# Patient Record
Sex: Male | Born: 1950 | Race: Black or African American | Hispanic: No | Marital: Married | Smoking: Never smoker
Health system: Southern US, Community
[De-identification: ages and names within clinical notes are randomized; demographics above are authoritative.]

## PROBLEM LIST (undated history)

## (undated) DIAGNOSIS — I209 Angina pectoris, unspecified: Secondary | ICD-10-CM

## (undated) DIAGNOSIS — I509 Heart failure, unspecified: Secondary | ICD-10-CM

## (undated) DIAGNOSIS — C61 Malignant neoplasm of prostate: Secondary | ICD-10-CM

## (undated) DIAGNOSIS — I639 Cerebral infarction, unspecified: Secondary | ICD-10-CM

## (undated) DIAGNOSIS — E785 Hyperlipidemia, unspecified: Secondary | ICD-10-CM

## (undated) DIAGNOSIS — E039 Hypothyroidism, unspecified: Secondary | ICD-10-CM

## (undated) DIAGNOSIS — I350 Nonrheumatic aortic (valve) stenosis: Secondary | ICD-10-CM

## (undated) DIAGNOSIS — Z789 Other specified health status: Secondary | ICD-10-CM

## (undated) DIAGNOSIS — I5189 Other ill-defined heart diseases: Secondary | ICD-10-CM

## (undated) DIAGNOSIS — I6529 Occlusion and stenosis of unspecified carotid artery: Secondary | ICD-10-CM

## (undated) DIAGNOSIS — I10 Essential (primary) hypertension: Secondary | ICD-10-CM

## (undated) DIAGNOSIS — N19 Unspecified kidney failure: Secondary | ICD-10-CM

## (undated) DIAGNOSIS — I2510 Atherosclerotic heart disease of native coronary artery without angina pectoris: Secondary | ICD-10-CM

## (undated) DIAGNOSIS — N186 End stage renal disease: Secondary | ICD-10-CM

## (undated) DIAGNOSIS — I739 Peripheral vascular disease, unspecified: Secondary | ICD-10-CM

## (undated) DIAGNOSIS — E119 Type 2 diabetes mellitus without complications: Secondary | ICD-10-CM

## (undated) HISTORY — DX: Atherosclerotic heart disease of native coronary artery without angina pectoris: I25.10

## (undated) HISTORY — DX: Hyperlipidemia, unspecified: E78.5

## (undated) HISTORY — DX: Occlusion and stenosis of unspecified carotid artery: I65.29

## (undated) HISTORY — DX: Peripheral vascular disease, unspecified: I73.9

## (undated) HISTORY — DX: Other ill-defined heart diseases: I51.89

## (undated) HISTORY — DX: Heart failure, unspecified: I50.9

## (undated) HISTORY — DX: Nonrheumatic aortic (valve) stenosis: I35.0

## (undated) HISTORY — DX: Essential (primary) hypertension: I10

## (undated) HISTORY — DX: Hypothyroidism, unspecified: E03.9

## (undated) HISTORY — PX: THYROIDECTOMY: SHX17

---

## 1978-03-09 HISTORY — PX: PR VEIN BYPASS GRAFT,AORTO-FEM-POP: 35551

## 1997-06-08 ENCOUNTER — Other Ambulatory Visit: Admission: RE | Admit: 1997-06-08 | Discharge: 1997-06-08 | Payer: Self-pay | Admitting: *Deleted

## 1997-06-15 ENCOUNTER — Other Ambulatory Visit: Admission: RE | Admit: 1997-06-15 | Discharge: 1997-06-15 | Payer: Self-pay | Admitting: *Deleted

## 1997-06-18 ENCOUNTER — Other Ambulatory Visit: Admission: RE | Admit: 1997-06-18 | Discharge: 1997-06-18 | Payer: Self-pay | Admitting: Nephrology

## 1997-06-22 ENCOUNTER — Other Ambulatory Visit: Admission: RE | Admit: 1997-06-22 | Discharge: 1997-06-22 | Payer: Self-pay | Admitting: *Deleted

## 1997-06-25 ENCOUNTER — Other Ambulatory Visit: Admission: RE | Admit: 1997-06-25 | Discharge: 1997-06-25 | Payer: Self-pay | Admitting: *Deleted

## 1997-06-27 ENCOUNTER — Other Ambulatory Visit: Admission: RE | Admit: 1997-06-27 | Discharge: 1997-06-27 | Payer: Self-pay | Admitting: *Deleted

## 1997-06-29 ENCOUNTER — Other Ambulatory Visit: Admission: RE | Admit: 1997-06-29 | Discharge: 1997-06-29 | Payer: Self-pay | Admitting: *Deleted

## 1997-07-04 ENCOUNTER — Other Ambulatory Visit: Admission: RE | Admit: 1997-07-04 | Discharge: 1997-07-04 | Payer: Self-pay | Admitting: Nephrology

## 1997-07-09 ENCOUNTER — Other Ambulatory Visit: Admission: RE | Admit: 1997-07-09 | Discharge: 1997-07-09 | Payer: Self-pay | Admitting: Nephrology

## 1997-07-25 ENCOUNTER — Other Ambulatory Visit: Admission: RE | Admit: 1997-07-25 | Discharge: 1997-07-25 | Payer: Self-pay | Admitting: *Deleted

## 1997-08-29 ENCOUNTER — Other Ambulatory Visit: Admission: RE | Admit: 1997-08-29 | Discharge: 1997-08-29 | Payer: Self-pay | Admitting: Nephrology

## 1997-09-26 ENCOUNTER — Other Ambulatory Visit: Admission: RE | Admit: 1997-09-26 | Discharge: 1997-09-26 | Payer: Self-pay | Admitting: *Deleted

## 1998-04-14 ENCOUNTER — Emergency Department (HOSPITAL_COMMUNITY): Admission: EM | Admit: 1998-04-14 | Discharge: 1998-04-14 | Payer: Self-pay | Admitting: Emergency Medicine

## 1998-04-25 ENCOUNTER — Ambulatory Visit (HOSPITAL_COMMUNITY): Admission: RE | Admit: 1998-04-25 | Discharge: 1998-04-25 | Payer: Self-pay | Admitting: *Deleted

## 1998-07-25 ENCOUNTER — Inpatient Hospital Stay (HOSPITAL_COMMUNITY): Admission: RE | Admit: 1998-07-25 | Discharge: 1998-08-01 | Payer: Self-pay

## 1998-09-05 ENCOUNTER — Ambulatory Visit (HOSPITAL_COMMUNITY): Admission: RE | Admit: 1998-09-05 | Discharge: 1998-09-05 | Payer: Self-pay | Admitting: Nephrology

## 1998-09-05 ENCOUNTER — Encounter: Payer: Self-pay | Admitting: Nephrology

## 1999-02-25 ENCOUNTER — Emergency Department (HOSPITAL_COMMUNITY): Admission: EM | Admit: 1999-02-25 | Discharge: 1999-02-25 | Payer: Self-pay | Admitting: *Deleted

## 1999-03-10 HISTORY — PX: NEPHRECTOMY TRANSPLANTED ORGAN: SUR880

## 1999-05-22 ENCOUNTER — Encounter (INDEPENDENT_AMBULATORY_CARE_PROVIDER_SITE_OTHER): Payer: Self-pay | Admitting: *Deleted

## 1999-05-22 ENCOUNTER — Ambulatory Visit (HOSPITAL_COMMUNITY): Admission: RE | Admit: 1999-05-22 | Discharge: 1999-05-22 | Payer: Self-pay | Admitting: General Surgery

## 1999-08-19 ENCOUNTER — Encounter: Payer: Self-pay | Admitting: Cardiology

## 1999-08-19 ENCOUNTER — Ambulatory Visit (HOSPITAL_COMMUNITY): Admission: RE | Admit: 1999-08-19 | Discharge: 1999-08-19 | Payer: Self-pay | Admitting: Cardiology

## 2000-05-05 ENCOUNTER — Ambulatory Visit (HOSPITAL_BASED_OUTPATIENT_CLINIC_OR_DEPARTMENT_OTHER): Admission: RE | Admit: 2000-05-05 | Discharge: 2000-05-05 | Payer: Self-pay | Admitting: Orthopedic Surgery

## 2004-03-04 ENCOUNTER — Encounter: Admission: RE | Admit: 2004-03-04 | Discharge: 2004-03-04 | Payer: Self-pay | Admitting: Nephrology

## 2004-10-18 ENCOUNTER — Emergency Department (HOSPITAL_COMMUNITY): Admission: EM | Admit: 2004-10-18 | Discharge: 2004-10-18 | Payer: Self-pay | Admitting: Emergency Medicine

## 2005-02-06 ENCOUNTER — Emergency Department (HOSPITAL_COMMUNITY): Admission: EM | Admit: 2005-02-06 | Discharge: 2005-02-06 | Payer: Self-pay | Admitting: Emergency Medicine

## 2006-08-10 ENCOUNTER — Ambulatory Visit: Payer: Self-pay | Admitting: Vascular Surgery

## 2006-08-10 ENCOUNTER — Inpatient Hospital Stay (HOSPITAL_COMMUNITY): Admission: EM | Admit: 2006-08-10 | Discharge: 2006-08-15 | Payer: Self-pay | Admitting: Emergency Medicine

## 2006-08-12 ENCOUNTER — Encounter: Payer: Self-pay | Admitting: Vascular Surgery

## 2006-09-01 ENCOUNTER — Ambulatory Visit: Payer: Self-pay | Admitting: Vascular Surgery

## 2006-09-22 ENCOUNTER — Ambulatory Visit: Payer: Self-pay | Admitting: Vascular Surgery

## 2007-07-17 ENCOUNTER — Emergency Department (HOSPITAL_COMMUNITY): Admission: EM | Admit: 2007-07-17 | Discharge: 2007-07-17 | Payer: Self-pay | Admitting: Emergency Medicine

## 2007-07-23 ENCOUNTER — Emergency Department (HOSPITAL_COMMUNITY): Admission: EM | Admit: 2007-07-23 | Discharge: 2007-07-23 | Payer: Self-pay | Admitting: Emergency Medicine

## 2007-10-26 ENCOUNTER — Ambulatory Visit (HOSPITAL_COMMUNITY): Admission: RE | Admit: 2007-10-26 | Discharge: 2007-10-26 | Payer: Self-pay | Admitting: Urology

## 2007-11-29 ENCOUNTER — Ambulatory Visit: Admission: RE | Admit: 2007-11-29 | Discharge: 2007-12-06 | Payer: Self-pay | Admitting: Radiation Oncology

## 2008-02-14 ENCOUNTER — Ambulatory Visit: Admission: RE | Admit: 2008-02-14 | Discharge: 2008-03-08 | Payer: Self-pay | Admitting: Radiation Oncology

## 2008-03-09 ENCOUNTER — Ambulatory Visit: Admission: RE | Admit: 2008-03-09 | Discharge: 2008-06-07 | Payer: Self-pay | Admitting: Radiation Oncology

## 2009-01-01 ENCOUNTER — Observation Stay (HOSPITAL_COMMUNITY): Admission: EM | Admit: 2009-01-01 | Discharge: 2009-01-03 | Payer: Self-pay | Admitting: Emergency Medicine

## 2009-03-09 DIAGNOSIS — I2510 Atherosclerotic heart disease of native coronary artery without angina pectoris: Secondary | ICD-10-CM

## 2009-03-09 HISTORY — DX: Atherosclerotic heart disease of native coronary artery without angina pectoris: I25.10

## 2009-03-09 HISTORY — PX: CORONARY ANGIOPLASTY WITH STENT PLACEMENT: SHX49

## 2009-03-23 ENCOUNTER — Encounter: Payer: Self-pay | Admitting: Emergency Medicine

## 2009-03-23 ENCOUNTER — Inpatient Hospital Stay (HOSPITAL_COMMUNITY): Admission: RE | Admit: 2009-03-23 | Discharge: 2009-03-27 | Payer: Self-pay | Admitting: Cardiology

## 2009-03-25 ENCOUNTER — Encounter (INDEPENDENT_AMBULATORY_CARE_PROVIDER_SITE_OTHER): Payer: Self-pay | Admitting: Cardiology

## 2010-05-25 LAB — BRAIN NATRIURETIC PEPTIDE: Pro B Natriuretic peptide (BNP): 77 pg/mL (ref 0.0–100.0)

## 2010-05-25 LAB — DIFFERENTIAL
Basophils Absolute: 0 10*3/uL (ref 0.0–0.1)
Eosinophils Absolute: 0.1 10*3/uL (ref 0.0–0.7)
Lymphocytes Relative: 15 % (ref 12–46)
Lymphs Abs: 0.7 10*3/uL (ref 0.7–4.0)
Neutrophils Relative %: 72 % (ref 43–77)

## 2010-05-25 LAB — BASIC METABOLIC PANEL
Calcium: 9.7 mg/dL (ref 8.4–10.5)
GFR calc Af Amer: 53 mL/min — ABNORMAL LOW (ref >60)
GFR calc non Af Amer: 44 mL/min — ABNORMAL LOW (ref >60)
Potassium: 4.1 mEq/L (ref 3.5–5.1)
Sodium: 137 mEq/L (ref 135–145)

## 2010-05-25 LAB — CK TOTAL AND CKMB (NOT AT ARMC)
CK, MB: 9.3 ng/mL (ref 0.3–4.0)
Relative Index: 5.4 — ABNORMAL HIGH (ref 0.0–2.5)

## 2010-05-25 LAB — CBC
HCT: 31.3 % — ABNORMAL LOW (ref 39.0–52.0)
Hemoglobin: 10.1 g/dL — ABNORMAL LOW (ref 13.0–17.0)
Platelets: 263 10*3/uL (ref 150–400)
WBC: 4.4 10*3/uL (ref 4.0–10.5)

## 2010-05-25 LAB — CARDIAC PANEL(CRET KIN+CKTOT+MB+TROPI)
CK, MB: 12.2 ng/mL (ref 0.3–4.0)
Relative Index: 6 — ABNORMAL HIGH (ref 0.0–2.5)
Total CK: 204 U/L (ref 7–232)

## 2010-05-25 LAB — GLUCOSE, CAPILLARY: Glucose-Capillary: 180 mg/dL — ABNORMAL HIGH (ref 70–99)

## 2010-05-25 LAB — TSH: TSH: 5.694 u[IU]/mL — ABNORMAL HIGH (ref 0.350–4.500)

## 2010-05-25 LAB — APTT: aPTT: 62 seconds — ABNORMAL HIGH (ref 24–37)

## 2010-05-25 LAB — TROPONIN I: Troponin I: 0.45 ng/mL — ABNORMAL HIGH (ref 0.00–0.06)

## 2010-05-25 LAB — HEPARIN LEVEL (UNFRACTIONATED): Heparin Unfractionated: 0.17 IU/mL — ABNORMAL LOW (ref 0.30–0.70)

## 2010-05-26 LAB — RENAL FUNCTION PANEL
Albumin: 3.2 g/dL — ABNORMAL LOW (ref 3.5–5.2)
BUN: 27 mg/dL — ABNORMAL HIGH (ref 6–23)
Calcium: 9.3 mg/dL (ref 8.4–10.5)
Creatinine, Ser: 1.89 mg/dL — ABNORMAL HIGH (ref 0.4–1.5)
GFR calc Af Amer: 45 mL/min — ABNORMAL LOW (ref >60)
GFR calc non Af Amer: 37 mL/min — ABNORMAL LOW (ref >60)

## 2010-05-26 LAB — CBC
HCT: 28.1 % — ABNORMAL LOW (ref 39.0–52.0)
HCT: 30.5 % — ABNORMAL LOW (ref 39.0–52.0)
HCT: 32.6 % — ABNORMAL LOW (ref 39.0–52.0)
Hemoglobin: 10.2 g/dL — ABNORMAL LOW (ref 13.0–17.0)
Hemoglobin: 10.7 g/dL — ABNORMAL LOW (ref 13.0–17.0)
MCHC: 33.2 g/dL (ref 30.0–36.0)
MCHC: 33.5 g/dL (ref 30.0–36.0)
MCV: 78.8 fL (ref 78.0–100.0)
MCV: 79.2 fL (ref 78.0–100.0)
Platelets: 191 10*3/uL (ref 150–400)
Platelets: 226 10*3/uL (ref 150–400)
Platelets: 227 10*3/uL (ref 150–400)
Platelets: 256 10*3/uL (ref 150–400)
RBC: 3.86 MIL/uL — ABNORMAL LOW (ref 4.22–5.81)
RBC: 4.12 MIL/uL — ABNORMAL LOW (ref 4.22–5.81)
RDW: 19.5 % — ABNORMAL HIGH (ref 11.5–15.5)
RDW: 20 % — ABNORMAL HIGH (ref 11.5–15.5)
RDW: 20.1 % — ABNORMAL HIGH (ref 11.5–15.5)
WBC: 3.6 10*3/uL — ABNORMAL LOW (ref 4.0–10.5)
WBC: 4.2 10*3/uL (ref 4.0–10.5)

## 2010-05-26 LAB — GLUCOSE, CAPILLARY
Glucose-Capillary: 105 mg/dL — ABNORMAL HIGH (ref 70–99)
Glucose-Capillary: 108 mg/dL — ABNORMAL HIGH (ref 70–99)
Glucose-Capillary: 119 mg/dL — ABNORMAL HIGH (ref 70–99)
Glucose-Capillary: 139 mg/dL — ABNORMAL HIGH (ref 70–99)
Glucose-Capillary: 168 mg/dL — ABNORMAL HIGH (ref 70–99)
Glucose-Capillary: 170 mg/dL — ABNORMAL HIGH (ref 70–99)
Glucose-Capillary: 68 mg/dL — ABNORMAL LOW (ref 70–99)

## 2010-05-26 LAB — HEPARIN LEVEL (UNFRACTIONATED)
Heparin Unfractionated: 0.52 IU/mL (ref 0.30–0.70)
Heparin Unfractionated: 0.75 IU/mL — ABNORMAL HIGH (ref 0.30–0.70)
Heparin Unfractionated: 0.77 IU/mL — ABNORMAL HIGH (ref 0.30–0.70)

## 2010-05-26 LAB — BASIC METABOLIC PANEL
BUN: 21 mg/dL (ref 6–23)
BUN: 26 mg/dL — ABNORMAL HIGH (ref 6–23)
BUN: 26 mg/dL — ABNORMAL HIGH (ref 6–23)
CO2: 23 mEq/L (ref 19–32)
CO2: 24 mEq/L (ref 19–32)
Calcium: 9 mg/dL (ref 8.4–10.5)
Calcium: 9.4 mg/dL (ref 8.4–10.5)
Chloride: 105 mEq/L (ref 96–112)
Creatinine, Ser: 1.63 mg/dL — ABNORMAL HIGH (ref 0.4–1.5)
Creatinine, Ser: 1.69 mg/dL — ABNORMAL HIGH (ref 0.4–1.5)
GFR calc Af Amer: 51 mL/min — ABNORMAL LOW (ref >60)
GFR calc non Af Amer: 37 mL/min — ABNORMAL LOW (ref >60)
Glucose, Bld: 118 mg/dL — ABNORMAL HIGH (ref 70–99)
Glucose, Bld: 134 mg/dL — ABNORMAL HIGH (ref 70–99)
Potassium: 4.1 mEq/L (ref 3.5–5.1)
Potassium: 4.3 mEq/L (ref 3.5–5.1)
Sodium: 141 mEq/L (ref 135–145)

## 2010-05-26 LAB — LIPID PANEL
HDL: 44 mg/dL (ref >39)
Total CHOL/HDL Ratio: 5 RATIO
Triglycerides: 191 mg/dL — ABNORMAL HIGH (ref <150)
VLDL: 38 mg/dL (ref 0–40)

## 2010-05-26 LAB — PROTIME-INR
INR: 1.08 (ref 0.00–1.49)
Prothrombin Time: 13.9 seconds (ref 11.6–15.2)

## 2010-05-26 LAB — DIFFERENTIAL
Basophils Absolute: 0 10*3/uL (ref 0.0–0.1)
Basophils Absolute: 0.1 10*3/uL (ref 0.0–0.1)
Eosinophils Absolute: 0.1 10*3/uL (ref 0.0–0.7)
Eosinophils Relative: 1 % (ref 0–5)
Eosinophils Relative: 3 % (ref 0–5)
Lymphocytes Relative: 19 % (ref 12–46)
Lymphs Abs: 0.7 10*3/uL (ref 0.7–4.0)
Monocytes Absolute: 0.5 10*3/uL (ref 0.1–1.0)
Neutro Abs: 2.3 10*3/uL (ref 1.7–7.7)
Neutrophils Relative %: 64 % (ref 43–77)

## 2010-05-26 LAB — CARDIAC PANEL(CRET KIN+CKTOT+MB+TROPI)
Relative Index: 5.5 — ABNORMAL HIGH (ref 0.0–2.5)
Total CK: 188 U/L (ref 7–232)
Troponin I: 1.21 ng/mL (ref 0.00–0.06)

## 2010-05-26 LAB — IRON AND TIBC
Iron: 34 ug/dL — ABNORMAL LOW (ref 42–135)
Saturation Ratios: 22 % (ref 20–55)
TIBC: 155 ug/dL — ABNORMAL LOW (ref 215–435)
UIBC: 121 ug/dL

## 2010-06-12 LAB — URINE CULTURE

## 2010-06-12 LAB — BASIC METABOLIC PANEL
BUN: 36 mg/dL — ABNORMAL HIGH (ref 6–23)
Calcium: 10.3 mg/dL (ref 8.4–10.5)
Chloride: 101 mEq/L (ref 96–112)
Creatinine, Ser: 2.04 mg/dL — ABNORMAL HIGH (ref 0.4–1.5)
GFR calc Af Amer: 41 mL/min — ABNORMAL LOW (ref >60)
GFR calc non Af Amer: 34 mL/min — ABNORMAL LOW (ref >60)

## 2010-06-12 LAB — URINALYSIS, ROUTINE W REFLEX MICROSCOPIC
Bilirubin Urine: NEGATIVE
Leukocytes, UA: NEGATIVE
Nitrite: NEGATIVE
Specific Gravity, Urine: 1.031 — ABNORMAL HIGH (ref 1.005–1.030)
pH: 6 (ref 5.0–8.0)

## 2010-06-12 LAB — GLUCOSE, RANDOM: Glucose, Bld: 386 mg/dL — ABNORMAL HIGH (ref 70–99)

## 2010-06-12 LAB — GLUCOSE, CAPILLARY
Glucose-Capillary: 103 mg/dL — ABNORMAL HIGH (ref 70–99)
Glucose-Capillary: 128 mg/dL — ABNORMAL HIGH (ref 70–99)
Glucose-Capillary: 136 mg/dL — ABNORMAL HIGH (ref 70–99)
Glucose-Capillary: 290 mg/dL — ABNORMAL HIGH (ref 70–99)
Glucose-Capillary: 408 mg/dL — ABNORMAL HIGH (ref 70–99)
Glucose-Capillary: 440 mg/dL — ABNORMAL HIGH (ref 70–99)
Glucose-Capillary: 462 mg/dL — ABNORMAL HIGH (ref 70–99)
Glucose-Capillary: 466 mg/dL — ABNORMAL HIGH (ref 70–99)

## 2010-06-12 LAB — POCT I-STAT, CHEM 8
Chloride: 105 mEq/L (ref 96–112)
Glucose, Bld: 465 mg/dL — ABNORMAL HIGH (ref 70–99)
HCT: 39 % (ref 39.0–52.0)
Potassium: 4.6 mEq/L (ref 3.5–5.1)

## 2010-06-12 LAB — CBC
MCV: 80.5 fL (ref 78.0–100.0)
Platelets: 203 10*3/uL (ref 150–400)
RBC: 4.46 MIL/uL (ref 4.22–5.81)
WBC: 4.4 10*3/uL (ref 4.0–10.5)

## 2010-06-12 LAB — URINE MICROSCOPIC-ADD ON

## 2010-06-12 LAB — DIFFERENTIAL
Eosinophils Relative: 3 % (ref 0–5)
Lymphocytes Relative: 20 % (ref 12–46)
Lymphs Abs: 0.9 10*3/uL (ref 0.7–4.0)
Monocytes Absolute: 0.4 10*3/uL (ref 0.1–1.0)

## 2010-06-12 LAB — KETONES, QUALITATIVE: Acetone, Bld: NEGATIVE

## 2010-06-12 LAB — HEMOGLOBIN A1C: Hgb A1c MFr Bld: 12.8 % — ABNORMAL HIGH (ref 4.6–6.1)

## 2010-07-22 NOTE — H&P (Signed)
Joshua [name], Joshua [name] NO.:  [id number]   MEDICAL RECORD NO.:  [id number]          PATIENT TYPE:  EMS   LOCATION:  MAJO                         FACILITY:  [facility]   PHYSICIAN:  Di Kindle. Edilia Bo, M.D.DATE OF BIRTH:  November 03, 1950   DATE OF ADMISSION:  08/10/2006  DATE OF DISCHARGE:                              HISTORY & PHYSICAL   REASON FOR ADMISSION:  Swelling in the left groin with left femoral  pseudoaneurysm.   HISTORY:  This is a pleasant 60 year old gentleman who has a history of  chronic renal insufficiency.  He ultimately underwent cadaveric renal  transplant on July 04, 2000, at [facility].  He has had a  nonfunctioning left thigh AV graft according to the patient.  On  Saturday, which was approximately 4 days ago, he developed some swelling  in the left groin.  This has persisted and he was seen in the office  today by Dr. Eliott Nine, who felt the patient should be transferred to the  hospital for further evaluation.   The patient has mild discomfort associated with the swelling in the left  groin.  He denies any fever or chills at home.  He states that the graft  was nonfunctioning.  I really do not get any clear-cut history of  claudication or rest pain in either lower extremity.  He is a fairly  poor historian.   PAST MEDICAL HISTORY:  His past medical history is significant for  hypertension and chronic renal insufficiency.  He denies any history of  diabetes, hypercholesterolemia, history of previous myocardial  infarction or history of congestive heart failure.  He denies any  history of COPD.  The only records I have from the hospital were a  history and physical from June of 2001.  At that time, they noted that  he had a parathyroidectomy in May of 2000, secondary to  hyperparathyroidism.  He also had a small right lobe thyroid papillary  carcinoma which was resected as well at that time.  He has apparently  had a right inguinal hernia  repair by Dr. Lurene Shadow in the past.  He has  also apparently had a right femoropopliteal bypass in the 1980s at [facility].  Of note, he had a cardiac catheterization apparently in 1996, which  showed normal coronary arteries with mildly decreased LV systolic  function.   ALLERGIES:  According to this old record, he had an anaphylactic  reaction at one point to Ditropan and fentanyl.   PAST SURGICAL HISTORY:  Significant for multiple previous hemodialysis  access procedures and a right cadaveric renal transplant as described  above.   SOCIAL HISTORY:  He is married, he has 4 children.  He does not use  tobacco.   MEDICATIONS:  The wife will bring these in from home.  He does not  remember his medications. The best he can tell us is that he is on  Synthroid for his thyroid, aspirin, and CellCept.  He will bring these  medications from home.   REVIEW OF SYSTEMS:  GENERAL:  He has had no weight loss, weight gain,  fever or chills.  CARDIAC:  He has had no chest pain, chest pressure,  palpitations or arrhythmias.  PULMONARY:  He has had no bronchitis,  asthma or wheezing.  GI:  He has had no recent change in his bowel  habits and has no history of peptic ulcer disease.  GU:  He has had no  dysuria or frequency.  VASCULAR:  He had no claudication, rest pain or  nonhealing ulcers.  He has had no history of DVT or phlebitis.  NEURO:  He has had no dizziness, blackouts, headaches or seizures.  HEMATOLOGIC:  He has had no bleeding problems or clotting disorders.  MUSCULOSKELETAL:  He has had no significant muscle or bone pain.   PHYSICAL EXAMINATION:  VITAL SIGNS:  Temperature is 99.4, blood pressure  126/75, heart rate is 77.  NECK:  I do not detect any carotid bruits.  This no cervical  lymphadenopathy.  LUNGS:  Clear bilaterally to auscultation.  CARDIAC:  Regular rate and rhythm.  ABDOMEN:  Soft and nontender.  EXTREMITIES:  He has a palpable right femoral pulse.  He has a palpable  right  dorsalis pedis pulse.  On the left side, he has a pulsatile mass  in the left groin.  The skin is not compromised.  There is no warmth to  suggest infection, no erythema or drainage.  I cannot palpate pulses in  the left foot, however, the foot appears adequately perfused.  He has no  ischemic ulcers.  He has no significant lower extremity swelling.   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  This is fairly significant in size and before  operating, I think we need to try to further delineate the anatomy with,  at the very least, a duplex scan, but also possibly a CT scan of the  pelvis, pending the results of his creatinine and consultation with Dr.  Eliott Nine. We tentatively plan on repairing the aneurysm Thursday, once we  have gathered some more information.      Di Kindle. Edilia Bo, M.D.  Electronically Signed     CSD/MEDQ  D:  08/10/2006  T:  08/11/2006  Job:  [id number]   cc:   Duke Salvia. Eliott Nine, M.D.

## 2010-07-22 NOTE — Assessment & Plan Note (Signed)
OFFICE VISIT   GUSS, FARRUGGIA  DOB:  1950/11/01                                       09/22/2006  CHART#:[id number]   I saw the patient in the office today for continued followup of his  groin wound.  He had a repair of a large left femoral artery  pseudoaneurysm with primary closure of the common femoral artery and  primary closure of the femoral vein.  He developed some slight wound  separation when I saw him last in the office on 09/01/2006, he comes in  today for a wound check.   EXAMINATION:  The wound is almost completely healed,  There is no  significant swelling or erythema.  I think this will heal without any  problems.  I will see him back p.r.n.   Di Kindle. Edilia Bo, M.D.  Electronically Signed   CSD/MEDQ  D:  09/22/2006  T:  09/23/2006  Job:  [id number]

## 2010-07-22 NOTE — Assessment & Plan Note (Signed)
OFFICE VISIT   BERK, PILOT  DOB:  Jun 27, 1950                                       09/01/2006  CHART#:[id number]   I saw Mr. Pola in the office today for followup, after repair of his  large pseudoaneurysm in the left groin.  He had a chronically occluded  left thigh graft and presented with a massive aneurysm.  He underwent  repair of the left femoral artery pseudoaneurysm with primary closure of  the femoral artery, and then primary closure of the femoral veins.  Thus, there is no prosthetic material in the groin.  He comes in for a  routine wound check.  He has some slight separation of one area of his  wound, and I did some slight excisional debridement in the office today.  We will have him pack this wet to dry, twice a day, with a 2 x 2.  I  will see him back in three weeks.  He knows to call sooner if he has  problems.   Di Kindle. Edilia Bo, M.D.  Electronically Signed   CSD/MEDQ  D:  09/01/2006  T:  09/02/2006  Job:  [id number]

## 2010-07-22 NOTE — Op Note (Signed)
NAMEJAMEER, STORIE            ACCOUNT NO.:  [id number]   MEDICAL RECORD NO.:  [id number]          PATIENT TYPE:  INP   LOCATION:  2020                         FACILITY:  [facility]   PHYSICIAN:  Di Kindle. Edilia Bo, M.D.DATE OF BIRTH:  1950/12/15   DATE OF PROCEDURE:  08/12/2006  DATE OF DISCHARGE:                               OPERATIVE REPORT   PREOPERATIVE DIAGNOSIS:  Left femoral artery pseudoaneurysm.   POSTOPERATIVE DIAGNOSIS:  Left femoral artery pseudoaneurysm.   PROCEDURE:  Repair of left femoral artery pseudoaneurysm with primary  closure of the common femoral artery and primary closure of the femoral  vein.   SURGEON:  Di Kindle. Edilia Bo, M.D.   ASSISTANT:  Jerold Coombe, P.A.   ANESTHESIA:  General.   TECHNIQUE:  The patient was taken to the operating room, received a  general anesthetic.  The left groin and thigh were prepped and draped in  usual sterile fashion.  Also prepped the abdomen in case we needed this  for proximal control.  A longitudinal incision was made over this large  pseudoaneurysm and proximally I was able to dissect below the inguinal  ligament and get proximal control the common femoral artery which was  controlled with a vessel loop.  It would be impossible to obtain distal  control.  They did dissect out the pseudoaneurysm enough to allow  exposure and the patient was then heparinized.  Of note, in anticipation  of having to patch the artery, I did explore the saphenous vein in the  thigh and was unable to identify the saphenous vein.  I did dissect  where the venous limb of the graft was anastomosed to the femoral vein  and this was clamped with a Satinsky clamp and the venous limb of the  graft removed from the vein.  The vein was then oversewn with 5-0  Prolene suture.  Next the common femoral artery was clamped.  The  pseudoaneurysm was entered.  A large hole in the common femoral artery  was identified.  This was controlled  Fogarty balloons.  This looked like  it could be closed primarily and I did this transversely to prevent any  narrowing.  This was done with two running 5-0 Prolene sutures and the  Fogarty catheter was then removed.  Flow was reestablished to the left  leg.  Hemostasis was obtained.  The wound was irrigated with copious  amounts of saline.  A 19 Blake drain was placed in the deep layer was  closed with 2-0 Vicryl.  The subcutaneous layer with 3-0 Vicryl.  The  skin was closed with 4-0 subcuticular stitch.  The vein harvest site was  closed with a deep layer of 3-0 Vicryl.  The skin closed with 4-0  Vicryl.  Sterile dressing was applied.  The patient tolerated procedure  well was transferred to recovery room in satisfactory condition.  All  needle and sponge counts were correct.      Di Kindle. Edilia Bo, M.D.  Electronically Signed    CSD/MEDQ  D:  08/12/2006  T:  08/12/2006  Job:  [id number]

## 2010-07-22 NOTE — Discharge Summary (Signed)
Joshua [name], Joshua [name]            ACCOUNT NO.:  [id number]   MEDICAL RECORD NO.:  [id number]          PATIENT TYPE:  INP   LOCATION:  2020                         FACILITY:  [facility]   PHYSICIAN:  Di Kindle. Edilia Bo, M.D.DATE OF BIRTH:  28-Jun-1950   DATE OF ADMISSION:  08/10/2006  DATE OF DISCHARGE:                               DISCHARGE SUMMARY   ANTICIPATED DATE OF DISCHARGE:  August 15, 2006.   ADMISSION DIAGNOSES:  1. Left femoral pseudoaneurysm.  2. Chronic renal insufficiency with a history of cadaveric renal      transplant.   DISCHARGE/SECONDARY DIAGNOSES:  1. Left femoral pseudoaneurysm status post repair.  2. Chronic renal insufficiency status post cadaveric renal transplant      on July 04, 2000 at [facility].  3. A history of non-functioning left arteriovenous graft.  4. Hypertension.  5. A history of thyroidectomy in May of 2000 secondary to      hyperparathyroidism.  6. A small right lobe thyroid papillary carcinoma status post      resection in May of 2002.  7. Right inguinal hernia repair by Dr. Lurene Shadow.  8. Right femoral-popliteal artery bypass in the 1980s.  9. Anaphylactic reaction to Ditropan and fentanyl.  10.Medical noncompliance.  11.Hypothyroidism.   PROCEDURES:  Repair of left femoral artery pseudoaneurysm with primary  closure of the common femoral artery .   BRIEF HISTORY:  Joshua [name] is a 60 year old African male s/p cadaveric  renal transplant in 2002. He has had a non functinal left thing AVG for  year.  Saturday prior to admission he developed swelling in his left  groin.  It persisted and he was evaluated  by Dr. Camille Bal who  felt that the patient should be transferred to [facility] for  vascular surgery consultation. He has not had any recent history of  claudication. rest pain or non healing ulcers.  It is felt most likely  to be a pseudoaneurysm related to his old chronic left arteriovenous  graft.  He is admitted  for further evaluation and probable surgical  repair.   HOSPITAL COURSE:  Joshua [name] was admitted to [facility] on  August 10, 2006.  He underwent ultrasounds revealing a large  pseudoaneurysm, greater than 5 x 5 cm.  The neck of the aneurysm could  not be visualized.  He also underwent a CT scan of the pelvis without  contrast as his admission creatinine was 2.14.  The CT showed a left  femoral pseudoaneurysm of 7.5 x 5.3 cm.  it was felt that repair was  indicated .  This was performed on August 12, 2006.  Intraoperatively, a  Blake drain was placed.  He had a short stay in the recovery room and  transferred to telemetry in 2000.  At the time of this dictation,  postoperative course has been uneventful.  His vitals have been stable.  His drain remains.  We anticipate that this will be removed in the next  day or 2.   Postoperative labs show a white blood count of 8.3, hemoglobin 11.3,  hematocrit of 34.1, platelets of 191.  Sodium 134, potassium 4.5, BUN of  20, creatinine 2.14, blood glucose is 109.   On postoperative day 1, he was allowed to be out of bed as tolerated and  has been ambulating without difficulty.  He has resumed a regular diet.  There are no significant changes in his status, and we are able to  discontinue his Jackson-Pratt drain.  It is anticipated that he will be  discharged home on Sunday, August 15, 2006.  At time of dictation, his left  foot remains warm with palpable pedal pulses.   DISCHARGE MEDICATIONS:  Of note, his discharge medications are based on  the medication list that he provided, although the patient is a somewhat  poor historian.  He is currently out of medication refills, and Dr.  Lowell Guitar has been notified and states that he will be reviewing his  medications from the [facility] office and call in his refills as  appropriate to the patient's pharmacy ([facility] on [address]) on August 13, 2006.   Currently his home medications are  listed as:  1. Baby aspirin p.o. daily.  2. Lipitor 25 mg p.o. daily.  3. Synthroid 150 mcg daily.  4. CellCept 250 mg 3 tablets b.i.d.  5. Rapamune 1 mg 4 tablets daily.  6. Protonix 40 mg daily.  7. Norvasc 10 mg daily.  8. New medication:  Tylox 1 to 2 tablets p.o. q.4h. p.r.n. pain.   DISCHARGE INSTRUCTIONS:  He is to continue his preoperative diet, he may  increase his activity slowly, he may shower, clean incision gently with  soap and water.  He should call if he develops redness, drainage, or  swelling from his incision site.  He should avoid driving or heavy  lifting for the next 2 weeks.  He should see Dr. Edilia Bo at the [facility]  office on September 01, 2006 at 11:15 and should follow up sooner if needed.  He is scheduled to follow up with Dr. Lowell Guitar as directed.      Jerold Coombe, P.A.      Di Kindle. Edilia Bo, M.D.  Electronically Signed    AWZ/MEDQ  D:  08/13/2006  T:  08/13/2006  Job:  [id number]   cc:   Mindi Slicker. Lowell Guitar, M.D.

## 2010-07-25 NOTE — H&P (Signed)
[place]. [facility]  Joshua:    Joshua [name], Joshua [name]                     MRN: [id number] Adm. Date:  [id number] Attending:  Corliss Marcus Dictator:   Anselm Lis, N.P. CC:         [facility]                         History and Physical  PRIMARY CARE PHYSICIAN:  Duke Salvia. Eliott Nine, M.D.  Also followed in the past by Milus Mallick, M.D.  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  He has undergone recent right inguinal hernia repair with mesh in March 2001; last year had a thyroidectomy secondary to secondary hyperparathyroidism.  Joshua has had a few-year complaint of lower substernal chest discomfort which is episodic and occurs on an approximately weekly or monthly basis.  Not particularly associated with activity and is without associated diaphoresis, nausea, and ______ of discomfort.  Last episode occurred about a week ago while at church and lasted about one-half minute.  A recent follow-up Cardiolite was suspicious for multivessel CAD.  Joshua now presents for coronary angiography and possible PCI.  CARDIAC RISK FACTORS:  Positive family history, history of hypertension.  PAST MEDICAL HISTORY:  1. End-stage renal disease secondary to hypertension; on hemodialysis for the last 21 years (Monday/Wednesday/Friday at [facility] in [place]).  Current left arm fistula.  2. Parathyroidectomy May 2000 secondary to secondary hyperparathyroidism.  Discovered with small right lobe thyroid papillary carcinoma which was resected as well at that time. 3. History of anaphylactic reaction to [facility] and FENTANYL.  4. (May 22, 1999) Right inguinal hernia repair with mesh by Dr. Lurene Shadow.  5. History of right fem-pop bypass surgery  in the 1980s at [facility].  6. History of hypertension.  7. History of chest pain in 1996; subsequent cardiac catheterization revealing normal coronary arteries, mildly decreased LV systolic function, and concentric LVH.  ALLERGIES:  ______  MEDICATIONS:  "Thyroid medication," Tums with each meal, iron infusion on a weekly basis at dialysis center.  SOCIAL HISTORY AND HABITS:  Joshua is happily married for 22 years.  Has four children ages 38 to 74.  He works in [employer] area at [employer].  Tobacco - remote as a teenager.  ETOH - negative.  FAMILY HISTORY:  Father deceased age late 23s of a myocardial infarction. Mother deceased age 68s of an MI.  She had renal disease and diabetes. Joshua has two brothers and one sister who are alive and well; another sister died in her late 60s of uncertain causes.  REVIEW OF SYSTEMS:  Joshua specifically denies problems with lightheadedness, dizziness, syncopal nor near-syncopal episodes.  Negative dysphagia to food or fluid.  Negative constipation or diarrhea.  Has noted episode of some very light PR, question of internal hemorrhoids.  Denies arthritic-type complaints. Denies pedal edema, orthopnea, PND, nor DOE.  PHYSICAL EXAMINATION:  VITAL SIGNS:  Blood pressure 137/88 with heart rate 77 and regular, respiratory rate 18, temperature 98.2.  Height 5 feet 11 inches, weight 189 pounds.  GENERAL:  He is a well-nourished middle-aged male in no apparent distress. Alert, well-groomed, and pleasantly conversant.  HEENT:  Decreased right carotid upstroke,  negative bruit.  He has multiple scars right subclavicular area from prior [place] catheter placement.  CHEST:  Lung sounds clear with equal bilateral excursion.  CARDIAC:  Regular rate and rhythm, grade 2-3/6 systolic murmur, loudest at apex, less intense at base.  Positive S4.  ABDOMEN:  Soft, nondistended, normoactive bowel sounds.  Negative abdominal aorta,  renal bruit.  Positive right femoral bruit.  No masses nor organomegaly appreciated.  Nontender to palpation.  EXTREMITIES:  Reveal +2/4 bilateral radial and right femoral; +1/4 left femoral.  Positive right femoral bruit.  Right pedal pulses +1/4, left pedal pulses +2/4.  NEUROLOGIC:  Cranial nerves 2-12 grossly intact.  Alert and oriented x 3.  GENITAL AND RECTAL:  Deferred.  LABORATORY DATA:  Aug 05, 1999 - EKG reveals NSR at 66 beats per minute with T wave in "strain" pattern inferior and lateral/precordial leads.  J point elevation with upsloping ST in V1-V3.  Stress Cardiolite from August 18, 1999 was suspicious for multivessel ischemia, possible three-vessel disease: 1. Decreased perfusion in anterior septum apex to base. 2. Decreased perfusion in inferior septum apex to base. 3. Decreased perfusion inferolateral wall apex to mid.  Mildly decreased LV    diastolic function with EF 48-51%.  Moderate LV dilation.  Inferoseptal    hypokinesis.  Laboratory from today reveals hemoglobin 15, wbc 3.7, and platelets decreased at 126.  Sodium 137, potassium 3.9, chloride 100, CO2 26, BUN 32, creatinine 12.7, glucose 83, calcium 8.3.  IMPRESSION:  Atypical chest pain in this 60 year old with positive family history of CAD, history of hypertension, and who has history of chronic end-stage renal disease on hemodialysis for about 20 years.  A prior cardiac catheterization in 1976 revealed normal coronary arteries, though concentric LVH.  However, a recent stress Cardiolite was suspicious for multivessel CAD, as detailed above.  PLAN:  Joshua has been counseled to undergo and has accepted plans for a coronary angiography with possible percutaneous intervention if indicated and able.  Risks, potential complications, benefits, and alternatives of procedure were discussed in detail.  [name] indicates his questions and concerns have been addressed and is agreeable to proceed. DD:   08/19/99 TD:  08/19/99 Job: [id number]

## 2010-07-25 NOTE — Op Note (Signed)
[place]. [facility]  Patient:    Joshua [name], Joshua [name]                      MRN: [id number] Proc. Date: 05/05/00 Attending:  Artist Pais. Mina Marble, M.D. CC:         Artist Pais. Mina Marble, M.D. 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  POSTOPERATIVE DIAGNOSIS:  Severe right carpal tunnel syndrome with thenar wasting and atrophy.  PROCEDURE:  Right carpal tunnel release with Camitz tendon transfer.  SURGEON:  Artist Pais. Mina Marble, M.D.  ASSISTANT:  Junius Roads. I[place]on, P.A.-C.  ANESTHESIA:  General.  TOURNIQUET TIME:  48 minutes.  COMPLICATIONS:  None.  DRAINS:  None.  DESCRIPTION OF PROCEDURE:  The patient was taken to the operating room.  After the adequate general anesthesia, the right upper extremity was prepped and draped in the usual sterile fashion.  An Esmarch was used to exsanguinate the limb, and the tourniquet was placed at 250 mmHg.  At this point in time a standard carpal tunnel incision was extended proximally and distally across the wrist crease, in line with the palmaris longus tendon proximally and the thenar crease distally.  Flaps were raised accordingly.  The palmar fascia and the palmaris longus tendon were identified.  Careful dissection revealed the intersection between the palmaris longus and the palmar fascia.  The palmar fascia was carefully raised as a flap off of the underlying structures.  This was then retracted proximally into the wound.  At this point in time, a standard carpal tunnel release was performed.  The nerve was resected.  There were no osseous lesions or ganglions present.  Next, a small incision was made on the radial border of the MP joint of the thumb, and the abductor tendon was identified.  Once this was identified, a subcutaneous tunnel was created from the MP flexion crease incision down to the main incision, and  the previously-harvested palmaris longus tendon and palmar fascia which had been tubed using a #3-0 Vicryl suture, was passed subcutaneously and sutured into the abductor tendon using a Pulvertaft weave of #3-0 Tycron.  Once this was done, the wound was thoroughly irrigated.  Both incisions were then closed with #3-0 Prolene subcuticular stitches, and a sterile dressing of Xeroform, 4 x 4s, and fluffs were placed.  The patient was placed in a volar splint with the wrist in neutral, and the thumb in opposition.  The patient tolerated the procedure well and went to the recovery room in a stable fashion. DD:  05/05/00 TD:  05/05/00 Job: [id number]

## 2010-07-25 NOTE — Op Note (Signed)
[place]. [facility]  Patient:    [name], Joshua                     MRN: [id number] Proc. Date: 05/22/99 Adm. Date:  [id number] Attending:  Sonda Primes CC:         Mardene Celeste. Lurene Shadow, M.D. (2 copies)                           Operative Report  PREOPERATIVE DIAGNOSIS:  Right inguinal hernia.  POSTOPERATIVE DIAGNOSIS:  Right inguinal hernia.  PROCEDURE:  Right inguinal herniorrhaphy with mesh.  SURGEON:  Mardene Celeste. Lurene Shadow, M.D.  ASSISTANT:  Nurse.  ANESTHESIA:  General.  INDICATIONS:  This patient is a 60 year old man with end-stage renal disease on  chronic hemodialysis who is status post right femoral popliteal bypass in the past. He presents with a very large, right inguinal hernia, and is brought to the operating room now for repair.  DESCRIPTION OF PROCEDURE:  Following the induction of satisfactory general anesthesia, the patient was positioned supinely, and the lower abdomen was prepped and draped to be included in a sterile operative field.  I did infiltrate the lower abdominal crease with 1% Xylocaine with epinephrine, and a transverse incision as made into the groin, deep into the skin and subcutaneous tissue, dissected down to the external oblique aponeurosis.  There was some scarring lateral to the external oblique aponeurosis from his previous surgery.  The aponeurosis was opened up through the external inguinal ring.  A diligent search for the ilioinguinal nerve was unproductive.  I elevated the spermatic cord from the inguinal floor and held it with a Penrose drain.  A very large, indirect sac was then dissected free from the spermatic cord, carrying the dissection up to the internal ring.  It was opened.  Note, there were no intra-abdominal contents within it.  It was doubly  suture ligated at its base, the sac was amputated, and it was forwarded for pathologic evaluation.  There were also two large lipomas of  the ______ were dissected free, suture ligated at their base, and amputated.  The floor of the inguinal canal was then repair with a ______ patch of Prolene mesh, sewn in at he pubic tubercle, carried up along the conjoined tendon to the internal ring, and  again, from the pubic tubercle along the shelving edge of Pouparts ligament to he internal ring.  This was done with a 2-0 Novofil.  The mesh was split so as to allow perfusion off the spermatic cord.  The tails of the mesh were then trimmed and sutured down into the internal oblique muscles, as closing off the internal  ring.  The repair was noted to be intact.  Sponge, instrument, and sharp counts  were verified.  The external oblique aponeurosis was then closed over the cord ith a running suture of 2-0 Vicryl after all areas of the section were checked for hemostasis and were noted to be dry.  The Scarpa fascia was then closed with a running 3-0 Vicryl suture, and the skin was closed with a running 4-0 Monocryl suture, and then reinforced with Steri-Strips.  Sterile dressings were applied.  The anesthetic was reversed.  The patient was removed from the operating room to the recovery room in stable condition having tolerated the procedure well. DD:  05/22/99 TD:  05/22/99 Job: [id number]

## 2010-07-25 NOTE — Cardiovascular Report (Signed)
[place]. [facility]  Patient:    Joshua [name], Joshua [name]                     MRN: [id number] Proc. Date: 08/18/99 Adm. Date:  [id number] Disc. Date: [id number] Attending:  Corliss Marcus CC:         Oley Balm. Charlann Boxer, M.D.             Cardiac Catheterization Laboratory                        Cardiac Catheterization  CINE NUMBER:  [id number]  REFERRING PHYSICIAN:  Dr. Alla Feeling.  PROCEDURE PERFORMED: 1. Left heart catheterization. 2. Coronary angiography. 3. Left ventriculogram.  INDICATIONS:  Joshua [name] is a 60 year old man, who has recently developed an atypical anginal syndrome.  He underwent exercise stress testing with myocardial perfusion imaging.  This revealed evidence of left ventricular dilatation  during exercise and possible three-vessel coronary ischemia. However, he underwent coronary angiography in 1996 which showed no significant CAD.  The patient is returned to the catheterization laboratory at this time to rule out the possibility of progressive disease.  DESCRIPTION OF PROCEDURE:  The patient was brought to the cardiac catheterization laboratory in the postabsorptive state.  A 5 French catheter sheath was inserted percutaneously into the right femoral artery.  A 6 French 110 cm pigtail catheter was used for left ventriculography and it measured aortic and left ventricular pressures.  A 30 degree RAO cine left ventriculogram was performed.  Coronary angiography was performed using 5 French left and right Judkins catheters.  At the completion of the procedure the catheter and catheter sheaths were removed.  Hemostasis was achieved by direct pressure.  The patient was transported to the recovery area in stable condition.  HEMODYNAMICS:  Systemic arterial pressure was 159/92 with a mean 120 mmHg. There was no systolic gradient across the aortic valve.  Left ventricular end-diastolic pressure was 25 mmHg preventriculogram and unchanged  post ventriculogram.  ANGIOGRAPHY:  LEFT VENTRICULOGRAM:  The left ventriculogram demonstrated a enlarged LV chamber with moderately severe concentric hypertrophy.  Overall normal systolic function with a visual estimate of ejection fraction at 50%.  No significant coronary calcification seen.  CORONARY ANGIOGRAPHY:  There was a left dominant coronary system present.  The main left coronary artery was normal.  The left anterior descending:  The left anterior descending artery and its branches were tortuous.  There is a mid vessel 20% plaque as it entered the distal segment.  The ongoing vessel was large and transapical.  A single large first diagonal branch arose and was without significant obstruction.  Left circumflex:  The left circumflex artery and its branches were without significant obstruction.  There were luminal irregularities only throughout the course of the vessel.  A large first marginal branch and then three small marginal branches arose.  Finally the PDA and the AV nodal artery arose from the distal segment of the AV groove circumflex.  Again, no significant obstructions are seen.  Right coronary artery:  The right coronary artery is small and nondominant. It also has luminal irregularities.  It feeds branches only to the right ventricle.  FINAL IMPRESSION: 1. Borderline left ventricular systolic function with moderately severe left    ventricular hypertrophy and marked dilatation. 2. No evidence of significant epicardial coronary disease.  PLAN/RECOMMENDATIONS:  No change is anticipated in the patients medical regimen.  He will be discharged from [facility] for follow-up  with the renal physicians. DD:  08/19/99 TD:  08/22/99 Job: [id number]

## 2010-12-25 LAB — CROSSMATCH
ABO/RH(D): O POS
Antibody Screen: NEGATIVE

## 2010-12-25 LAB — CBC
MCHC: 33.1
Platelets: 221
RBC: 4.3
RBC: 4.98
RDW: 18.2 — ABNORMAL HIGH
WBC: 6.1

## 2010-12-25 LAB — BASIC METABOLIC PANEL
BUN: 27 — ABNORMAL HIGH
CO2: 23
Calcium: 8 — ABNORMAL LOW
Calcium: 9.1
Chloride: 106
Creatinine, Ser: 2.14 — ABNORMAL HIGH
Creatinine, Ser: 2.14 — ABNORMAL HIGH
GFR calc Af Amer: 39 — ABNORMAL LOW
GFR calc Af Amer: 39 — ABNORMAL LOW
GFR calc non Af Amer: 32 — ABNORMAL LOW
Glucose, Bld: 109 — ABNORMAL HIGH
Sodium: 136

## 2010-12-25 LAB — DIFFERENTIAL
Lymphocytes Relative: 16
Lymphs Abs: 1
Monocytes Relative: 15 — ABNORMAL HIGH
Neutrophils Relative %: 67

## 2010-12-25 LAB — APTT: aPTT: 41 — ABNORMAL HIGH

## 2010-12-25 LAB — PROTIME-INR
INR: 1.1
Prothrombin Time: 14.9

## 2010-12-25 LAB — ABO/RH: ABO/RH(D): O POS

## 2011-01-22 ENCOUNTER — Emergency Department (HOSPITAL_COMMUNITY)
Admission: EM | Admit: 2011-01-22 | Discharge: 2011-01-23 | Disposition: A | Discharge status: Home / Self Care | Payer: Medicare Other | Attending: Emergency Medicine | Admitting: Emergency Medicine

## 2011-01-22 ENCOUNTER — Encounter: Payer: Self-pay | Admitting: *Deleted

## 2011-01-22 ENCOUNTER — Emergency Department (HOSPITAL_COMMUNITY): Payer: Medicare Other

## 2011-01-22 DIAGNOSIS — E785 Hyperlipidemia, unspecified: Secondary | ICD-10-CM | POA: Insufficient documentation

## 2011-01-22 DIAGNOSIS — Z8679 Personal history of other diseases of the circulatory system: Secondary | ICD-10-CM | POA: Insufficient documentation

## 2011-01-22 DIAGNOSIS — R51 Headache: Secondary | ICD-10-CM | POA: Insufficient documentation

## 2011-01-22 DIAGNOSIS — Z940 Kidney transplant status: Secondary | ICD-10-CM | POA: Insufficient documentation

## 2011-01-22 DIAGNOSIS — I2510 Atherosclerotic heart disease of native coronary artery without angina pectoris: Secondary | ICD-10-CM | POA: Insufficient documentation

## 2011-01-22 DIAGNOSIS — M79609 Pain in unspecified limb: Secondary | ICD-10-CM | POA: Insufficient documentation

## 2011-01-22 DIAGNOSIS — E119 Type 2 diabetes mellitus without complications: Secondary | ICD-10-CM | POA: Insufficient documentation

## 2011-01-22 DIAGNOSIS — R109 Unspecified abdominal pain: Secondary | ICD-10-CM | POA: Insufficient documentation

## 2011-01-22 HISTORY — DX: Atherosclerotic heart disease of native coronary artery without angina pectoris: I25.10

## 2011-01-22 HISTORY — DX: Unspecified kidney failure: N19

## 2011-01-22 HISTORY — DX: Cerebral infarction, unspecified: I63.9

## 2011-01-22 LAB — POCT I-STAT, CHEM 8
Calcium, Ion: 1.14 mmol/L (ref 1.12–1.32)
Glucose, Bld: 91 mg/dL (ref 70–99)
HCT: 35 % — ABNORMAL LOW (ref 39.0–52.0)
Hemoglobin: 11.9 g/dL — ABNORMAL LOW (ref 13.0–17.0)
TCO2: 22 mmol/L (ref 0–100)

## 2011-01-22 LAB — GLUCOSE, CAPILLARY: Glucose-Capillary: 92 mg/dL (ref 70–99)

## 2011-01-22 MED ORDER — HYDROMORPHONE HCL PF 1 MG/ML IJ SOLN
1.0000 mg | Freq: Once | INTRAMUSCULAR | Status: AC
  Administered 2011-01-22: 1 mg via INTRAMUSCULAR
  Filled 2011-01-22: qty 1

## 2011-01-22 NOTE — ED Notes (Signed)
CT called for Head CT, aware of pt and order

## 2011-01-22 NOTE — ED Notes (Signed)
Pt complaining of multiple syptoms. Pt states that he has been having HA and taking medication with no relief. Pt states that he has left arm pain and left leg pain that is intermittant. Pt denies having any weakness or numbness with the pain. Pt grips equal and strength equal in legs. Pt states that the pain in his leg is in his hamstring area. Pt states that he had a kidney transplant 11 years ago and is having pain in his right lower abdomen. Pt alert and oriented and able to follow commands and move extremities.

## 2011-01-22 NOTE — ED Notes (Signed)
Headache and rt  Thigh pain for one week.  Kidney transplant 11 years ago.  He has pain in the rt abd also.

## 2011-01-22 NOTE — ED Provider Notes (Signed)
History     CSN: [id number] Arrival date & time: 01/22/2011  5:37 PM   First MD Initiated Contact with Patient 01/22/11 2014      Chief Complaint  Patient presents with  . Leg Pain    (Consider location/radiation/quality/duration/timing/severity/associated sxs/prior treatment) HPI Comments: Patient presents with a chief complaint of headache. He states that his headache started a week ago and he's been treating it with Excedrin however it is not relieving his headache. This is very concerning to him because he normally does not suffer from headaches. Of note the patient has a history of hyperlipidemia coronary artery disease diabetes and renal failure. His history states that he has had a past stroke however the patient denies any knowledge of this. Patient's baseline is unknown and she did not appear to be the best historian. In addition to his headache he is complaining of left leg pain and right-sided abdominal pain. He states that he had a renal transplant 11 years ago and he thinks this is causing his abdominal pain however he is not really concerned with this or his leg pain. The patient states his primary concern is his headache because he did not use a having them and he can make it go away.  Patient is a 60 y.o. male presenting with leg pain. The history is provided by the patient.  Leg Pain  Pertinent negatives include no numbness.    Past Medical History  Diagnosis Date  . Stroke   . Myocardial infarct   . Coronary artery disease   . Diabetes mellitus   . Renal failure     Past Surgical History  Procedure Date  . Coronary stent placement   . Nephrectomy transplanted organ     No family history on file.  History  Substance Use Topics  . Smoking status: Never Smoker   . Smokeless tobacco: Not on file  . Alcohol Use: No      Review of Systems  Constitutional: Negative for fever, chills, diaphoresis, activity change, appetite change and fatigue.  HENT:  Negative for ear pain, rhinorrhea, trouble swallowing, neck pain and neck stiffness.   Respiratory: Positive for cough. Negative for chest tightness, shortness of breath, wheezing and stridor.   Cardiovascular: Negative for chest pain, palpitations and leg swelling.  Gastrointestinal: Positive for abdominal pain and abdominal distention. Negative for nausea and vomiting.  Genitourinary: Negative for dysuria, urgency and flank pain.  Musculoskeletal: Negative for myalgias and back pain.  Skin: Negative for color change.  Neurological: Positive for headaches. Negative for dizziness, seizures, syncope, light-headedness and numbness.  Psychiatric/Behavioral: Negative for confusion (patient's wife states that he seems more altered mentally than normally however the patient himself says that this is not true.).    Allergies  Review of patient's allergies indicates no known allergies.  Home Medications   Current Outpatient Rx  Name Route Sig Dispense Refill  . AMLODIPINE BESYLATE 10 MG PO TABS Oral Take 10 mg by mouth daily.      . ASPIRIN 81 MG PO CHEW Oral Chew 81 mg by mouth daily.      . ATORVASTATIN CALCIUM 40 MG PO TABS Oral Take 40 mg by mouth daily.      Marland Kitchen EZETIMIBE 10 MG PO TABS Oral Take 10 mg by mouth daily.      Marland Kitchen EZETIMIBE-SIMVASTATIN 10-20 MG PO TABS Oral Take 1 tablet by mouth at bedtime.        BP 146/83  Pulse 87  Temp(Src) 98.7 F (  37.1 C) (Oral)  Resp 16  Ht 5\' 11"  (1.803 m)  Wt 200 lb (90.719 kg)  BMI 27.89 kg/m2  SpO2 95%  Physical Exam  Constitutional: He is oriented to person, place, and time. He appears well-developed and well-nourished. No distress.  HENT:  Head: Normocephalic and atraumatic.  Mouth/Throat: Oropharynx is clear and moist. No oropharyngeal exudate.  Eyes: Conjunctivae and EOM are normal. Pupils are equal, round, and reactive to light. No scleral icterus.  Neck: Normal range of motion. Neck supple. No tracheal deviation present. No thyromegaly  present.  Cardiovascular: Normal rate, regular rhythm, normal heart sounds and intact distal pulses.   Pulmonary/Chest: Effort normal and breath sounds normal. No stridor.  Abdominal: Soft. Bowel sounds are normal. There is no tenderness. There is no CVA tenderness.  Musculoskeletal: Normal range of motion. He exhibits no edema and no tenderness.       Left upper leg: He exhibits tenderness. He exhibits no bony tenderness, no swelling, no edema, no deformity and no laceration.  Neurological: He is alert and oriented to person, place, and time. He has normal strength and normal reflexes. No cranial nerve deficit or sensory deficit. He displays a negative Romberg sign. Coordination and gait normal.  Skin: Skin is warm and dry. No rash noted. He is not diaphoretic. No erythema. No pallor.  Psychiatric: He has a normal mood and affect. His behavior is normal.    ED Course  Procedures (including critical care time)  Labs Reviewed  POCT I-STAT, CHEM 8 - Abnormal; Notable for the following:    BUN 26 (*)    Creatinine, Ser 1.80 (*)    Hemoglobin 11.9 (*)    HCT 35.0 (*)    All other components within normal limits  GLUCOSE, CAPILLARY  I-STAT, CHEM 8  POCT CBG MONITORING   Ct Head Wo Contrast  01/22/2011  *RADIOLOGY REPORT*  Clinical Data: 60 year old male with headache times 2 weeks. History of prostate carcinoma.  CT HEAD WITHOUT CONTRAST  Technique:  Contiguous axial images were obtained from the base of the skull through the vertex without contrast.  Comparison: Whole body bone scan 10/26/2007.  Findings: The paranasal sinuses are almost completely opacified. There are fluid levels in the left frontal sinus and right sphenoid.  The mastoids and visualized tympanic cavities are clear. Visualized orbit soft tissues are within normal limits.  Visualized scalp soft tissues are within normal limits.  Calcified atherosclerosis at the skull base.  Bone mineralization is within normal limits.  No  suspicious osseous lesion.  Patchy bilateral subcortical white matter hypodensity.  Chronic- appearing lacunar infarct in the lateral aspect of the left thalamus.  No ventriculomegaly. No midline shift, mass effect, or evidence of mass lesion.  No acute intracranial hemorrhage identified.  No evidence of cortically based acute infarction identified.  No suspicious intracranial vascular hyperdensity.  IMPRESSION: 1.  Chronic small vessel ischemia. No acute intracranial abnormality. 2.  Pansinus paranasal sinus inflammatory changes with fluid levels suggestive of acute sinusitis.  Original Report Authenticated By: Harley Hallmark, M.D.     1. Headache       MDM  Headache; no neuro deficits        Jaci Carrel, [place] 01/22/11 2355

## 2011-01-26 NOTE — ED Provider Notes (Signed)
Medical screening examination/treatment/procedure(s) were performed by non-physician practitioner and as supervising physician I was immediately available for consultation/collaboration.  Kerrie Latour R. Chandell Attridge, MD 01/26/11 0616 

## 2011-08-11 ENCOUNTER — Encounter: Payer: Self-pay | Admitting: Vascular Surgery

## 2011-08-11 ENCOUNTER — Other Ambulatory Visit: Payer: Self-pay

## 2011-08-11 DIAGNOSIS — I6529 Occlusion and stenosis of unspecified carotid artery: Secondary | ICD-10-CM

## 2011-08-12 ENCOUNTER — Encounter: Payer: Self-pay | Admitting: Vascular Surgery

## 2011-08-12 ENCOUNTER — Other Ambulatory Visit (INDEPENDENT_AMBULATORY_CARE_PROVIDER_SITE_OTHER): Payer: Medicare Other | Admitting: *Deleted

## 2011-08-12 ENCOUNTER — Ambulatory Visit (INDEPENDENT_AMBULATORY_CARE_PROVIDER_SITE_OTHER): Payer: Medicare Other | Admitting: Vascular Surgery

## 2011-08-12 VITALS — BP 142/76 | HR 78 | Temp 98.3°F | Resp 12 | Ht 71.0 in | Wt 200.0 lb

## 2011-08-12 DIAGNOSIS — I6529 Occlusion and stenosis of unspecified carotid artery: Secondary | ICD-10-CM

## 2011-08-12 NOTE — Assessment & Plan Note (Signed)
This patient has a greater than 80% asymptomatic right carotid stenosis. I have recommended right carotid endarterectomy. I have reviewed the indications for carotid endarterectomy, that is to lower the risk of future stroke. I have also reviewed the potential complications of surgery, including but not limited to: bleeding, stroke (perioperative risk 1-2%), MI, nerve injury of other unpredictable medical problems. All of the patients questions were answered and they are agreeable to proceed with surgery. Ideally I would like to stop his Plavix one-week preoperatively. Dr. Mayford Knife was not in the office today so I will call her tomorrow to discuss this. In addition I want to be sure that he is cleared from a cardiac standpoint given his history of aortic stenosis and coronary artery disease. Once these issues are resolved we'll plan on scheduling him for right carotid endarterectomy. Tentatively were hoping to schedule him on 08/25/2011.

## 2011-08-12 NOTE — Progress Notes (Signed)
[facility] [place]  Patient name: Joshua [name] MRN: [id number] DOB: 12/10/1950 Sex: male  REASON FOR CONSULT: critical right carotid stenosis. Referred by Dr. Tracy Turner  HPI: Joshua [name] is a 61 y.o. male who was seen by Dr. Turner on 06/15/11. He does have a history of aortic stenosis. His most recent echo showed normal LV function with mild to moderate AS. He also had some chest pain briefly about a month ago but has had no further symptoms. He had a carotid duplex scan performed on 08/07/2011 which suggested a critical right carotid stenosis and he was sent for vascular consultation.  Of note he is right-handed. He denies any previous history of stroke, TIAs, expressive or receptive aphasia, or amaurosis fugax. He is on aspirin and Plavix. He has had previous PTCA. He also has undergone previous renal transplant.  Past Medical History  Diagnosis Date  . Stroke   . Myocardial infarct   . Coronary artery disease   . Diabetes mellitus   . Renal failure   . Carotid artery occlusion   . Hypothyroidism   . Hypertension   . ASCVD (arteriosclerotic cardiovascular disease)   . CHF (congestive heart failure)   . Cancer     prostate  . Hyperlipidemia     Family History  Problem Relation Age of Onset  . Diabetes Mother   . Hyperlipidemia Mother   . Hypertension Mother   . Heart disease Mother     before age 60  . Hyperlipidemia Father   . Hypertension Father   . Hyperlipidemia Sister   . Hypertension Sister   . Diabetes Brother   . Hypertension Brother   . Hyperlipidemia Brother     SOCIAL HISTORY: History  Substance Use Topics  . Smoking status: Never Smoker   . Smokeless tobacco: Never Used  . Alcohol Use: No    Allergies  Allergen Reactions  . Fentanyl     "Almost died"   . Propofol     "Almost died"     Current Outpatient Prescriptions  Medication Sig Dispense Refill  . amLODipine (NORVASC) 10 MG tablet Take 10 mg by mouth  daily.        . aspirin 81 MG chewable tablet Chew 81 mg by mouth daily.        . atenolol (TENORMIN) 50 MG tablet Take 50 mg by mouth daily.      . atorvastatin (LIPITOR) 40 MG tablet Take 40 mg by mouth daily.        . clopidogrel (PLAVIX) 75 MG tablet Take 75 mg by mouth daily.      . ezetimibe (ZETIA) 10 MG tablet Take 10 mg by mouth daily.        . ezetimibe-simvastatin (VYTORIN) 10-20 MG per tablet Take 1 tablet by mouth at bedtime.        . glipiZIDE (GLUCOTROL) 5 MG tablet Take 5 mg by mouth daily.      . insulin glargine (LANTUS) 100 UNIT/ML injection Inject 24 Units into the skin at bedtime.      . levothyroxine (SYNTHROID, LEVOTHROID) 200 MCG tablet Take 200 mcg by mouth daily.      . mycophenolate (MYFORTIC) 360 MG TBEC Take 360 mg by mouth daily.      . pantoprazole (PROTONIX) 40 MG tablet Take 40 mg by mouth daily.      . simvastatin (ZOCOR) 20 MG tablet Take 20 mg by mouth every evening.      . sirolimus (  RAPAMUNE) 1 MG tablet Take 2 mg by mouth daily. Four tablets daily        REVIEW OF SYSTEMS: [X ] denotes positive finding; [  ] denotes negative finding  CARDIOVASCULAR:  [X ] chest pain- none in the last month.   [ ] chest pressure   [ ] palpitations   [ ] orthopnea   [ ] dyspnea on exertion   [ ] claudication   [ ] rest pain   [ ] DVT   [ ] phlebitis PULMONARY:   [ ] productive cough   [ ] asthma   [ ] wheezing NEUROLOGIC:   [ ] weakness  [ ] paresthesias  [ ] aphasia  [ ] amaurosis  [ ] dizziness HEMATOLOGIC:   [ ] bleeding problems   [ ] clotting disorders MUSCULOSKELETAL:  [ ] joint pain   [ ] joint swelling [ ] leg swelling GASTROINTESTINAL: [ ]  blood in stool  [ ]  hematemesis GENITOURINARY:  [ ]  dysuria  [ ]  hematuria PSYCHIATRIC:  [ ] history of major depression INTEGUMENTARY:  [ ] rashes  [ ] ulcers CONSTITUTIONAL:  [ ] fever   [ ] chills  PHYSICAL EXAM: Filed Vitals:   08/12/11 1432 08/12/11 1443  BP: 190/83 142/76  Pulse: 78 78  Temp: 98.3 F (36.8  C)   TempSrc: Oral   Resp:  12  Height: 5' 11" (1.803 m)   Weight: 200 lb (90.719 kg)    Body mass index is 27.89 kg/(m^2). GENERAL: The patient is a well-nourished male, in no acute distress. The vital signs are documented above. CARDIOVASCULAR: There is a regular rate and rhythm with a systolic murmur. He has bilateral carotid bruits. He has no significant lower extremity swelling. PULMONARY: There is good air exchange bilaterally without wheezing or rales. ABDOMEN: Soft and non-tender with normal pitched bowel sounds.  MUSCULOSKELETAL: There are no major deformities or cyanosis. NEUROLOGIC: No focal weakness or paresthesias are detected. SKIN: There are no ulcers or rashes noted. PSYCHIATRIC: The patient has a normal affect.  DATA:  I have independently interpreted his carotid duplex scan which shows a greater than 80% right carotid stenosis. End diastolic velocity is 178 cm/s suggesting a critical stenosis. He has no significant stenosis on the left. Both vertebral arteries are patent with normally directed flow. The carotid bifurcation is noted to be high however he has normal internal carotid artery visualized above the stenosis.  I've also reviewed his records from Dr. Turner's office. He has a history of coronary atherosclerosis and is followed closely by Dr. Turner. He has a history of hypertension and hypercholesterolemia which had been controlled closely.  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 I have recommended right carotid endarterectomy. I have reviewed the indications for carotid endarterectomy, that is to lower the risk of future stroke. I have also reviewed the potential complications of surgery, including but not limited to: bleeding, stroke (perioperative risk 1-2%), MI, nerve injury of other unpredictable medical problems. All of the  patients questions were answered and they are agreeable to proceed with surgery. Ideally I would like to stop his Plavix one-week preoperatively. Dr. Turner was not in the office today so I will call her tomorrow to discuss this. In addition I want to be sure that he is cleared from a cardiac standpoint given his history of aortic   stenosis and coronary artery disease. Once these issues are resolved we'll plan on scheduling him for right carotid endarterectomy. Tentatively were hoping to schedule him on 08/25/2011.     Newell Frater S [facility] [place] Beeper: [phone]    

## 2011-08-13 ENCOUNTER — Encounter (HOSPITAL_COMMUNITY): Payer: Self-pay

## 2011-08-13 ENCOUNTER — Other Ambulatory Visit: Payer: Self-pay

## 2011-08-13 ENCOUNTER — Emergency Department (INDEPENDENT_AMBULATORY_CARE_PROVIDER_SITE_OTHER)
Admission: EM | Admit: 2011-08-13 | Discharge: 2011-08-13 | Disposition: A | Discharge status: Home / Self Care | Payer: Medicare Other | Source: Home / Self Care | Attending: Family Medicine | Admitting: Family Medicine

## 2011-08-13 DIAGNOSIS — H6120 Impacted cerumen, unspecified ear: Secondary | ICD-10-CM

## 2011-08-13 NOTE — Discharge Instructions (Signed)
Cerumen Impaction  A cerumen impaction is when the wax in your ear forms a plug. This plug usually causes reduced hearing. Sometimes it also causes an earache or dizziness. Removing a cerumen impaction can be difficult and painful. The wax sticks to the ear canal. The canal is sensitive and bleeds easily. If you try to remove a heavy wax buildup with a cotton tipped swab, you may push it in further.  Irrigation with water, suction, and small ear curettes may be used to clear out the wax. If the impaction is fixed to the skin in the ear canal, ear drops may be needed for a few days to loosen the wax. People who build up a lot of wax frequently can use ear wax removal products available in your local drugstore.  SEEK MEDICAL CARE IF:    You develop an earache, increased hearing loss, or marked dizziness.  Document Released: 04/02/2004 Document Revised: 02/12/2011 Document Reviewed: 05/23/2009  [employer] Patient Information 2012 [employer]

## 2011-08-13 NOTE — ED Provider Notes (Signed)
History     CSN: [id number]  Arrival date & time 08/13/11  1317   First MD Initiated Contact with Patient 08/13/11 1446      Chief Complaint  Patient presents with  . Otalgia    (Consider location/radiation/quality/duration/timing/severity/associated sxs/prior treatment) HPI Comments: The patient reports decreased hearing and fullness in his left ear. Has been using q-tips. No cough or cold symptoms. No cough or cold symptoms. No trauma or changes in altitude.   The history is provided by the patient.    Past Medical History  Diagnosis Date  . Stroke   . Myocardial infarct   . Coronary artery disease   . Diabetes mellitus   . Renal failure   . Carotid artery occlusion   . Hypothyroidism   . Hypertension   . ASCVD (arteriosclerotic cardiovascular disease)   . CHF (congestive heart failure)   . Cancer     prostate  . Hyperlipidemia     Past Surgical History  Procedure Date  . Coronary stent placement   . Nephrectomy transplanted organ   . Pr vein bypass graft,aorto-fem-pop 1980    fem pop   . Skin cancer excision     Family History  Problem Relation Age of Onset  . Diabetes Mother   . Hyperlipidemia Mother   . Hypertension Mother   . Heart disease Mother     before age 27  . Hyperlipidemia Father   . Hypertension Father   . Hyperlipidemia Sister   . Hypertension Sister   . Diabetes Brother   . Hypertension Brother   . Hyperlipidemia Brother     History  Substance Use Topics  . Smoking status: Never Smoker   . Smokeless tobacco: Never Used  . Alcohol Use: No      Review of Systems  Constitutional: Negative.   HENT: Positive for hearing loss. Negative for congestion, rhinorrhea, sneezing, postnasal drip and ear discharge.   Respiratory: Negative.   Cardiovascular: Negative.   Gastrointestinal: Negative.   Musculoskeletal: Negative.   Skin: Negative.     Allergies  Fentanyl and Propofol  Home Medications   Current Outpatient Rx  Name  Route Sig Dispense Refill  . AMLODIPINE BESYLATE 10 MG PO TABS Oral Take 10 mg by mouth daily.      . ASPIRIN 81 MG PO CHEW Oral Chew 81 mg by mouth daily.      . ATENOLOL 50 MG PO TABS Oral Take 50 mg by mouth daily.    . ATORVASTATIN CALCIUM 40 MG PO TABS Oral Take 40 mg by mouth daily.      Marland Kitchen CLOPIDOGREL BISULFATE 75 MG PO TABS Oral Take 75 mg by mouth daily.    Marland Kitchen EZETIMIBE 10 MG PO TABS Oral Take 10 mg by mouth daily.      Marland Kitchen EZETIMIBE-SIMVASTATIN 10-20 MG PO TABS Oral Take 1 tablet by mouth at bedtime.      Marland Kitchen GLIPIZIDE 5 MG PO TABS Oral Take 5 mg by mouth daily.    . INSULIN GLARGINE 100 UNIT/ML [place] SOLN Subcutaneous Inject 24 Units into the skin at bedtime.    Marland Kitchen LEVOTHYROXINE SODIUM 200 MCG PO TABS Oral Take 200 mcg by mouth daily.    Marland Kitchen MYCOPHENOLATE SODIUM 360 MG PO TBEC Oral Take 360 mg by mouth daily.    Marland Kitchen PANTOPRAZOLE SODIUM 40 MG PO TBEC Oral Take 40 mg by mouth daily.    Marland Kitchen SIMVASTATIN 20 MG PO TABS Oral Take 20 mg by mouth every evening.    Marland Kitchen  SIROLIMUS 1 MG PO TABS Oral Take 2 mg by mouth daily. Four tablets daily      BP 183/87  Pulse 78  Temp(Src) 98.1 F (36.7 C) (Oral)  Resp 16  Physical Exam  Nursing note and vitals reviewed. Constitutional: He appears well-developed and well-nourished. No distress.  HENT:  Head: Normocephalic and atraumatic.       Left eac occuded with wax. Right eac clear, nose clear, throat clear  Cardiovascular: Normal rate and regular rhythm.   Pulmonary/Chest: Effort normal and breath sounds normal.  Skin: Skin is warm and dry.    ED Course  Procedures (including critical care time)  Labs Reviewed - No data to display No results found.   1. Cerumen impaction       MDM          Randa Spike, MD 08/13/11 [phone]

## 2011-08-13 NOTE — ED Notes (Signed)
C/o fullness in ears

## 2011-08-14 ENCOUNTER — Encounter (HOSPITAL_COMMUNITY): Payer: Self-pay | Admitting: Pharmacy Technician

## 2011-08-19 ENCOUNTER — Encounter (HOSPITAL_COMMUNITY)
Admission: RE | Admit: 2011-08-19 | Discharge: 2011-08-19 | Disposition: A | Discharge status: Home / Self Care | Payer: Medicare Other | Source: Ambulatory Visit | Attending: Anesthesiology | Admitting: Anesthesiology

## 2011-08-19 ENCOUNTER — Encounter (HOSPITAL_COMMUNITY): Payer: Self-pay

## 2011-08-19 ENCOUNTER — Encounter (HOSPITAL_COMMUNITY)
Admission: RE | Admit: 2011-08-19 | Discharge: 2011-08-19 | Disposition: A | Discharge status: Home / Self Care | Payer: Medicare Other | Source: Ambulatory Visit | Attending: Vascular Surgery | Admitting: Vascular Surgery

## 2011-08-19 HISTORY — DX: Angina pectoris, unspecified: I20.9

## 2011-08-19 LAB — URINALYSIS, ROUTINE W REFLEX MICROSCOPIC
Glucose, UA: NEGATIVE mg/dL
Leukocytes, UA: NEGATIVE
pH: 6 (ref 5.0–8.0)

## 2011-08-19 LAB — COMPREHENSIVE METABOLIC PANEL
ALT: 9 U/L (ref 0–53)
Albumin: 3.7 g/dL (ref 3.5–5.2)
Alkaline Phosphatase: 66 U/L (ref 39–117)
Chloride: 103 mEq/L (ref 96–112)
GFR calc Af Amer: 45 mL/min — ABNORMAL LOW (ref >90)
Glucose, Bld: 122 mg/dL — ABNORMAL HIGH (ref 70–99)
Potassium: 3.6 mEq/L (ref 3.5–5.1)
Sodium: 138 mEq/L (ref 135–145)
Total Protein: 8 g/dL (ref 6.0–8.3)

## 2011-08-19 LAB — SURGICAL PCR SCREEN: MRSA, PCR: NEGATIVE

## 2011-08-19 LAB — CBC
Hemoglobin: 11.2 g/dL — ABNORMAL LOW (ref 13.0–17.0)
MCHC: 31.1 g/dL (ref 30.0–36.0)
WBC: 4.4 10*3/uL (ref 4.0–10.5)

## 2011-08-19 LAB — PROTIME-INR
INR: 1.02 (ref 0.00–1.49)
Prothrombin Time: 13.6 seconds (ref 11.6–15.2)

## 2011-08-19 LAB — URINE MICROSCOPIC-ADD ON

## 2011-08-19 NOTE — Progress Notes (Signed)
Chart to Shonna Chock PA to review cardiac records and creatinine and PTT.

## 2011-08-19 NOTE — Progress Notes (Signed)
Spoke with Elease Hashimoto at [facility]-requested most recent office note, EKG and ECHO./Stress test.

## 2011-08-19 NOTE — Procedures (Unsigned)
CAROTID DUPLEX EXAM  INDICATION:  Carotid stenosis  HISTORY: Diabetes:  Yes Cardiac:  MI x2, cardiac stent Hypertension:  Yes Smoking:  No Previous Surgery:  Right kidney transplant.  Dialysis for 22 years/graft left arm. CV History:  Asymptomatic Amaurosis Fugax No, Paresthesias No, Hemiparesis No                                      RIGHT             LEFT Brachial systolic pressure:         178               Graft. Brachial Doppler waveforms:         Biphasic. Vertebral direction of flow:        Antegrade.        Antegrade. DUPLEX VELOCITIES (cm/sec) CCA peak systolic                   72                106 ECA peak systolic                   157               109 ICA peak systolic                   526               89 ICA end diastolic                   178               26 PLAQUE MORPHOLOGY:                  Heterogenous      Soft PLAQUE AMOUNT:                      Moderate to severe.                 Minimal. PLAQUE LOCATION:                    ICA, ECA.         ICA, CCA.  IMPRESSION:  80% to 99% right ICA stenosis. 1% to 39% left ICA stenosis.  ___________________________________________ Di Kindle. Edilia Bo, M.D.  SS/MEDQ  D:  08/12/2011  T:  08/12/2011  Job:  [id number]

## 2011-08-19 NOTE — Pre-Procedure Instructions (Signed)
21 Joshua [name]  08/19/2011   Your procedure is scheduled on:  08/25/11  Report to [facility] at 5:30 AM.  Call this number if you have problems the morning of surgery: [phone]   Remember:   Do not eat food or drink liquids after midnight.     Take these medicines the morning of surgery with A SIP OF WATER: Amlodipine/Norvasc, Atenolol/Tenormin, Levothyroid/Synthroid, Protonix/Pantoprazole, Myfortic and Rapamune.   Do not wear jewelry, make-up or nail polish.  Do not wear lotions, powders, or perfumes. You may wear deodorant.  Do not shave 48 hours prior to surgery. Men may shave face and neck.  Do not bring valuables to the hospital.  Contacts, dentures or bridgework may not be worn into surgery.  Leave suitcase in the car. After surgery it may be brought to your room.  For patients admitted to the hospital, checkout time is 11:00 AM the day of discharge.   Patients discharged the day of surgery will not be allowed to drive home.    Special Instructions: CHG Shower Use Special Wash: 1/2 bottle night before surgery and 1/2 bottle morning of surgery.   Please read over the following fact sheets that you were given: Pain Booklet, Coughing and Deep Breathing, Blood Transfusion Information, MRSA Information and Surgical Site Infection Prevention

## 2011-08-20 ENCOUNTER — Encounter (HOSPITAL_COMMUNITY): Payer: Self-pay | Admitting: Vascular Surgery

## 2011-08-20 NOTE — Anesthesia Preprocedure Evaluation (Addendum)
Anesthesia Evaluation  Patient identified by MRN, date of birth, ID band Patient awake    Reviewed: Allergy & Precautions, H&P , NPO status , Patient's Chart, lab work & pertinent test results  Airway Mallampati: I TM Distance: >3 FB Neck ROM: Full    Dental  (+) Dental Advisory Given, Loose and Poor Dentition   Pulmonary  breath sounds clear to auscultation        Cardiovascular hypertension, Pt. on medications + CAD, + Past MI and + Cardiac Stents + Valvular Problems/Murmurs AS Rhythm:Regular Rate:Normal  LAD BMS, occluded RCA, 60-70% OM1 (cath 03/2009) Mod AS (echo 06/2011)   Neuro/Psych    GI/Hepatic   Endo/Other  Diabetes mellitus-, Well Controlled, Type 2, Insulin Dependent  Renal/GU      Musculoskeletal   Abdominal   Peds  Hematology   Anesthesia Other Findings   Reproductive/Obstetrics                         Anesthesia Physical Anesthesia Plan  ASA: IV  Anesthesia Plan: General   Post-op Pain Management:    Induction: Intravenous  Airway Management Planned: Oral ETT  Additional Equipment: Arterial line  Intra-op Plan:   Post-operative Plan:   Informed Consent: I have reviewed the patients History and Physical, chart, labs and discussed the procedure including the risks, benefits and alternatives for the proposed anesthesia with the patient or authorized representative who has indicated his/her understanding and acceptance.   Dental advisory given  Plan Discussed with: CRNA, Anesthesiologist and Surgeon  Anesthesia Plan Comments: (See my Anesthesia Note.  Dr. Mayford Knife recommends intraoperative IV Nitro.  Shonna Chock, PA-C)       Anesthesia Quick Evaluation

## 2011-08-20 NOTE — Consult Note (Addendum)
Anesthesia Chart Review:  Patient is a 61 year scheduled for a right CEA on 08/25/11 for asymptomatic severe right ICA stenosis.  History includes moderate AS (echo 06/2011), CAD/NSTEMI 03/2009 s/p BMS LAD with known RCA occlusion and 60-70% OM1 stenosis, DM on insulin, HTN, PVD s/p FPBG '80's, thyroid cancer s/p thyroidectomy, ESRD s/p renal transplant '01 on immunosuprressant therapy with Rapamune and Myfortic, non-smoker, CHF, HLD.  Previous records indicate that Dr. Lowell Guitar is his Nephrologist.  His Cardiologist is Dr. Armanda Magic.  She cleared him with moderate  CV risk and recommended IV Nitro during the surgery to maximize coronary blood flow.    Echo on 06/18/11 [facility]) showed moderate LVH, EF 58.5%, severe LAE, trace MR, severe AV calcification with moderate AS, mild AR, trivial TR, trace PR, findings suggestive of grade II diastolic dysfunction with elevated LA pressure.    Nuclear stress test on 06/18/11 [facility]) showed " a fixed inferior defect with very minimal reversal corresponding to known RCA occlusion."  EKG on 08/19/11 showed NSR, LVH with repolarization abnormality.  CXR on 08/19/11 showed no active disease.  Labs noted.  PTT 41, PT/INR WNL.  CBC WNL.  BUN 23, Cr 1.80.  His Cr has been between 1.8-1.9 since 03/26/09, so overall appears stable.  Will request his last Nephrology notes.  Will defer additional pre-operative lab orders, if any,  to Dr. Edilia Bo.  Anticipate he can proceed if no significant change in his status.  (Addendum: Dr. Edilia Bo would like PTT repeated on arrival.)  Shonna Chock, PA-C

## 2011-08-24 ENCOUNTER — Encounter (HOSPITAL_COMMUNITY): Payer: Self-pay | Admitting: Cardiology

## 2011-08-24 ENCOUNTER — Emergency Department (INDEPENDENT_AMBULATORY_CARE_PROVIDER_SITE_OTHER)
Admission: EM | Admit: 2011-08-24 | Discharge: 2011-08-24 | Disposition: A | Discharge status: Home / Self Care | Payer: Medicare Other | Source: Home / Self Care

## 2011-08-24 DIAGNOSIS — H6500 Acute serous otitis media, unspecified ear: Secondary | ICD-10-CM

## 2011-08-24 DIAGNOSIS — H66009 Acute suppurative otitis media without spontaneous rupture of ear drum, unspecified ear: Secondary | ICD-10-CM

## 2011-08-24 MED ORDER — DEXTROSE 5 % IV SOLN
1.5000 g | INTRAVENOUS | Status: AC
  Administered 2011-08-25: 1.5 g via INTRAVENOUS
  Filled 2011-08-24: qty 1.5

## 2011-08-24 MED ORDER — FEXOFENADINE HCL 180 MG PO TABS: 180.0000 mg | ORAL_TABLET | Freq: Every day | ORAL | Status: DC

## 2011-08-24 NOTE — ED Notes (Signed)
Pt here for recheck of left ear. He was seen here 6/6 for the same thing. He feels like his ear opened up but not all the way. He is able to hear out of left ear but not as well as usual. He denies pain to the left ear. Pt denies fever. Tolerating PO intake.

## 2011-08-24 NOTE — ED Provider Notes (Signed)
History     CSN: [id number]  Arrival date & time 08/24/11  1107   First MD Initiated Contact with Patient 08/24/11 1128      Chief Complaint  Patient presents with  . Otalgia    (Consider location/radiation/quality/duration/timing/severity/associated sxs/prior treatment) HPI  61 yo with left ear fullness and decreased hearting since 6/613. He had it cleaned here on that day but symptoms have not resolved.  No pain, fever, chills, sweats, sore throat, swollen glands or discharge from the ear.   Past Medical History  Diagnosis Date  . Coronary artery disease   . Diabetes mellitus   . Carotid artery occlusion   . Hypothyroidism   . Hypertension   . ASCVD (arteriosclerotic cardiovascular disease)   . CHF (congestive heart failure)   . Cancer     prostate  . Hyperlipidemia   . Myocardial infarct     about 2 yrs ago  . Anginal pain     last chest pain was a couple of months ago-just lasted less than a minute  . Stroke     "light stroke" about time of MI  . Renal failure     s/p renal transplant  . Aortic stenosis     moderate AS by 06/2011 echo (Dr. Armanda Magic)    Past Surgical History  Procedure Date  . Coronary stent placement   . Nephrectomy transplanted organ   . Pr vein bypass graft,aorto-fem-pop 1980    fem pop   . Skin cancer excision     Family History  Problem Relation Age of Onset  . Diabetes Mother   . Hyperlipidemia Mother   . Hypertension Mother   . Heart disease Mother     before age 18  . Hyperlipidemia Father   . Hypertension Father   . Hyperlipidemia Sister   . Hypertension Sister   . Diabetes Brother   . Hypertension Brother   . Hyperlipidemia Brother     History  Substance Use Topics  . Smoking status: Never Smoker   . Smokeless tobacco: Never Used  . Alcohol Use: No      Review of Systems  Constitutional: Negative.   HENT: Positive for hearing loss. Negative for ear pain, congestion, rhinorrhea, sneezing, neck pain,  postnasal drip, sinus pressure, tinnitus and ear discharge.   Eyes: Negative.   Respiratory: Negative for cough and shortness of breath.   Cardiovascular: Negative for chest pain.  Gastrointestinal: Negative for abdominal pain and diarrhea.  Genitourinary: Negative for dysuria, enuresis and difficulty urinating.  Musculoskeletal: Negative for arthralgias.  Neurological: Negative for dizziness, light-headedness and headaches.  Psychiatric/Behavioral: Negative for confusion, dysphoric mood and agitation.    Allergies  Fentanyl and Propofol  Home Medications   Current Outpatient Rx  Name Route Sig Dispense Refill  . AMLODIPINE BESYLATE 10 MG PO TABS Oral Take 10 mg by mouth daily.      . ASPIRIN 81 MG PO CHEW Oral Chew 81 mg by mouth daily.      . ATENOLOL 50 MG PO TABS Oral Take 50 mg by mouth daily.    . ATORVASTATIN CALCIUM 80 MG PO TABS Oral Take 80 mg by mouth daily.    Marland Kitchen CLOPIDOGREL BISULFATE 75 MG PO TABS Oral Take 75 mg by mouth daily.    Marland Kitchen EZETIMIBE-SIMVASTATIN 10-20 MG PO TABS Oral Take 1 tablet by mouth at bedtime.      . INSULIN GLARGINE 100 UNIT/ML [place] SOLN Subcutaneous Inject 26 Units into the skin at bedtime.     Marland Kitchen  INSULIN LISPRO (HUMAN) 100 UNIT/ML [place] SOLN Subcutaneous Inject 8 Units into the skin 3 (three) times daily with meals as needed.    Marland Kitchen LEVOTHYROXINE SODIUM 150 MCG PO TABS Oral Take 150 mcg by mouth daily.    Marland Kitchen MYCOPHENOLATE SODIUM 180 MG PO TBEC Oral Take 720 mg by mouth 2 (two) times daily.     Marland Kitchen PANTOPRAZOLE SODIUM 40 MG PO TBEC Oral Take 40 mg by mouth daily.    Marland Kitchen SIMVASTATIN 20 MG PO TABS Oral Take 20 mg by mouth every evening.    Marland Kitchen SIROLIMUS 1 MG PO TABS Oral Take 4 mg by mouth daily.       BP 168/71  Pulse 75  Temp 98.1 F (36.7 C) (Oral)  Resp 16  SpO2 98%  Physical Exam  Constitutional: He is oriented to person, place, and time. He appears well-developed and well-nourished. No distress.  HENT:  Head: Normocephalic and atraumatic.  Right Ear:  External ear normal.  Mouth/Throat: Oropharynx is clear and moist.       Left ear- bulging tympanic membrane- no air fluid levels noted, no reddening of tympanic membrane or exudate.  Eyes: Conjunctivae and EOM are normal. Pupils are equal, round, and reactive to light.  Cardiovascular: Normal rate, regular rhythm and normal heart sounds.   Pulmonary/Chest: Effort normal and breath sounds normal.  Abdominal: Soft. Bowel sounds are normal.  Neurological: He is alert and oriented to person, place, and time.  Skin: Skin is warm and dry.  Psychiatric: He has a normal mood and affect. His behavior is normal. Judgment and thought content normal.    ED Course  Procedures (including critical care time)    MDM  Serous otitis media with effusion - treat with Allegra but no decongestants due to HTN.  F/u with ENT in 1 week.  Medications reviewed.   He is scheduled for a carotid endarterectomy tomorrow which should not be postponed for this issue.          Calvert Cantor, MD 08/24/11 1740

## 2011-08-25 ENCOUNTER — Encounter (HOSPITAL_COMMUNITY): Payer: Self-pay | Admitting: Vascular Surgery

## 2011-08-25 ENCOUNTER — Inpatient Hospital Stay (HOSPITAL_COMMUNITY)
Admission: RE | Admit: 2011-08-25 | Discharge: 2011-08-26 | DRG: 038 | Disposition: A | Discharge status: Home / Self Care | Payer: Medicare Other | Source: Ambulatory Visit | Attending: Vascular Surgery | Admitting: Vascular Surgery

## 2011-08-25 ENCOUNTER — Encounter (HOSPITAL_COMMUNITY)
Admission: RE | Disposition: A | Discharge status: Home / Self Care | Payer: Self-pay | Source: Ambulatory Visit | Attending: Vascular Surgery

## 2011-08-25 ENCOUNTER — Encounter (HOSPITAL_COMMUNITY): Payer: Self-pay | Admitting: *Deleted

## 2011-08-25 ENCOUNTER — Encounter (HOSPITAL_COMMUNITY): Payer: Self-pay

## 2011-08-25 ENCOUNTER — Telehealth: Payer: Self-pay | Admitting: Vascular Surgery

## 2011-08-25 ENCOUNTER — Ambulatory Visit (HOSPITAL_COMMUNITY): Payer: Medicare Other | Admitting: Vascular Surgery

## 2011-08-25 DIAGNOSIS — E119 Type 2 diabetes mellitus without complications: Secondary | ICD-10-CM | POA: Diagnosis present

## 2011-08-25 DIAGNOSIS — I2510 Atherosclerotic heart disease of native coronary artery without angina pectoris: Secondary | ICD-10-CM | POA: Diagnosis present

## 2011-08-25 DIAGNOSIS — Z8546 Personal history of malignant neoplasm of prostate: Secondary | ICD-10-CM

## 2011-08-25 DIAGNOSIS — Z01812 Encounter for preprocedural laboratory examination: Secondary | ICD-10-CM

## 2011-08-25 DIAGNOSIS — I509 Heart failure, unspecified: Secondary | ICD-10-CM | POA: Diagnosis present

## 2011-08-25 DIAGNOSIS — Z940 Kidney transplant status: Secondary | ICD-10-CM

## 2011-08-25 DIAGNOSIS — I252 Old myocardial infarction: Secondary | ICD-10-CM

## 2011-08-25 DIAGNOSIS — E785 Hyperlipidemia, unspecified: Secondary | ICD-10-CM | POA: Diagnosis present

## 2011-08-25 DIAGNOSIS — Z7982 Long term (current) use of aspirin: Secondary | ICD-10-CM

## 2011-08-25 DIAGNOSIS — I6529 Occlusion and stenosis of unspecified carotid artery: Principal | ICD-10-CM | POA: Diagnosis present

## 2011-08-25 DIAGNOSIS — E039 Hypothyroidism, unspecified: Secondary | ICD-10-CM | POA: Diagnosis present

## 2011-08-25 DIAGNOSIS — Z8673 Personal history of transient ischemic attack (TIA), and cerebral infarction without residual deficits: Secondary | ICD-10-CM

## 2011-08-25 DIAGNOSIS — Z9861 Coronary angioplasty status: Secondary | ICD-10-CM

## 2011-08-25 DIAGNOSIS — I359 Nonrheumatic aortic valve disorder, unspecified: Secondary | ICD-10-CM | POA: Diagnosis present

## 2011-08-25 DIAGNOSIS — I10 Essential (primary) hypertension: Secondary | ICD-10-CM | POA: Diagnosis present

## 2011-08-25 HISTORY — PX: ENDARTERECTOMY: SHX5162

## 2011-08-25 LAB — GLUCOSE, CAPILLARY: Glucose-Capillary: 134 mg/dL — ABNORMAL HIGH (ref 70–99)

## 2011-08-25 LAB — APTT: aPTT: 43 seconds — ABNORMAL HIGH (ref 24–37)

## 2011-08-25 SURGERY — ENDARTERECTOMY, CAROTID
Anesthesia: General | Site: Neck | Laterality: Right | Wound class: Clean

## 2011-08-25 MED ORDER — SIMVASTATIN 20 MG PO TABS
20.0000 mg | ORAL_TABLET | Freq: Every evening | ORAL | Status: DC
  Filled 2011-08-25: qty 1

## 2011-08-25 MED ORDER — CLOPIDOGREL BISULFATE 75 MG PO TABS
75.0000 mg | ORAL_TABLET | Freq: Every day | ORAL | Status: DC
  Administered 2011-08-26: 75 mg via ORAL
  Filled 2011-08-25 (×2): qty 1

## 2011-08-25 MED ORDER — LABETALOL HCL 5 MG/ML IV SOLN: 10.0000 mg | INTRAVENOUS | Status: DC | PRN

## 2011-08-25 MED ORDER — GLYCOPYRROLATE 0.2 MG/ML IJ SOLN
INTRAMUSCULAR | Status: DC | PRN
  Administered 2011-08-25: .8 mg via INTRAVENOUS

## 2011-08-25 MED ORDER — ROCURONIUM BROMIDE 100 MG/10ML IV SOLN
INTRAVENOUS | Status: DC | PRN
  Administered 2011-08-25: 50 mg via INTRAVENOUS
  Administered 2011-08-25: 5 mg via INTRAVENOUS
  Administered 2011-08-25: 10 mg via INTRAVENOUS

## 2011-08-25 MED ORDER — 0.9 % SODIUM CHLORIDE (POUR BTL) OPTIME
TOPICAL | Status: DC | PRN
  Administered 2011-08-25: 1000 mL

## 2011-08-25 MED ORDER — LIDOCAINE HCL (PF) 1 % IJ SOLN
INTRAMUSCULAR | Status: AC
  Filled 2011-08-25: qty 30

## 2011-08-25 MED ORDER — ATORVASTATIN CALCIUM 40 MG PO TABS
40.0000 mg | ORAL_TABLET | Freq: Every day | ORAL | Status: DC
  Filled 2011-08-25: qty 1

## 2011-08-25 MED ORDER — SIROLIMUS 1 MG PO TABS
4.0000 mg | ORAL_TABLET | Freq: Every day | ORAL | Status: DC
  Administered 2011-08-25: 4 mg via ORAL
  Filled 2011-08-25 (×2): qty 4

## 2011-08-25 MED ORDER — FENTANYL CITRATE 0.05 MG/ML IJ SOLN
INTRAMUSCULAR | Status: DC | PRN
  Administered 2011-08-25 (×2): 100 ug via INTRAVENOUS
  Administered 2011-08-25: 50 ug via INTRAVENOUS

## 2011-08-25 MED ORDER — MYCOPHENOLATE SODIUM 180 MG PO TBEC
720.0000 mg | DELAYED_RELEASE_TABLET | Freq: Two times a day (BID) | ORAL | Status: DC
  Administered 2011-08-25: 720 mg via ORAL
  Filled 2011-08-25 (×3): qty 4

## 2011-08-25 MED ORDER — DOCUSATE SODIUM 100 MG PO CAPS
100.0000 mg | ORAL_CAPSULE | Freq: Every day | ORAL | Status: DC
Start: 2011-08-26 — End: 2011-08-26

## 2011-08-25 MED ORDER — PNEUMOCOCCAL VAC POLYVALENT 25 MCG/0.5ML IJ INJ
0.5000 mL | INJECTION | INTRAMUSCULAR | Status: AC
  Administered 2011-08-26: 0.5 mL via INTRAMUSCULAR
  Filled 2011-08-25: qty 0.5

## 2011-08-25 MED ORDER — MORPHINE SULFATE 2 MG/ML IJ SOLN: 2.0000 mg | INTRAMUSCULAR | Status: DC | PRN

## 2011-08-25 MED ORDER — HEPARIN SODIUM (PORCINE) 1000 UNIT/ML IJ SOLN
INTRAMUSCULAR | Status: DC | PRN
  Administered 2011-08-25: 8000 [IU] via INTRAVENOUS

## 2011-08-25 MED ORDER — ETOMIDATE 2 MG/ML IV SOLN
INTRAVENOUS | Status: DC | PRN
  Administered 2011-08-25: 20 mg via INTRAVENOUS

## 2011-08-25 MED ORDER — OXYCODONE-ACETAMINOPHEN 5-325 MG PO TABS
1.0000 | ORAL_TABLET | ORAL | Status: DC | PRN
  Administered 2011-08-25: 1 via ORAL
  Filled 2011-08-25: qty 1

## 2011-08-25 MED ORDER — SODIUM CHLORIDE 0.9 % IV SOLN: 500.0000 mL | Freq: Once | INTRAVENOUS | Status: AC | PRN

## 2011-08-25 MED ORDER — HYDROMORPHONE HCL PF 1 MG/ML IJ SOLN: 0.2500 mg | INTRAMUSCULAR | Status: DC | PRN

## 2011-08-25 MED ORDER — ONDANSETRON HCL 4 MG/2ML IJ SOLN: 4.0000 mg | Freq: Four times a day (QID) | INTRAMUSCULAR | Status: DC | PRN

## 2011-08-25 MED ORDER — ATENOLOL 50 MG PO TABS
50.0000 mg | ORAL_TABLET | Freq: Every day | ORAL | Status: DC
  Filled 2011-08-25: qty 1

## 2011-08-25 MED ORDER — NEOSTIGMINE METHYLSULFATE 1 MG/ML IJ SOLN
INTRAMUSCULAR | Status: DC | PRN
  Administered 2011-08-25: 4 mg via INTRAVENOUS

## 2011-08-25 MED ORDER — PHENOL 1.4 % MT LIQD
1.0000 | OROMUCOSAL | Status: DC | PRN
  Administered 2011-08-25: 1 via OROMUCOSAL
  Filled 2011-08-25: qty 177

## 2011-08-25 MED ORDER — ACETAMINOPHEN 650 MG RE SUPP: 325.0000 mg | RECTAL | Status: DC | PRN

## 2011-08-25 MED ORDER — LIDOCAINE-EPINEPHRINE (PF) 1 %-1:200000 IJ SOLN
INTRAMUSCULAR | Status: AC
  Filled 2011-08-25: qty 10

## 2011-08-25 MED ORDER — HYDRALAZINE HCL 20 MG/ML IJ SOLN: 10.0000 mg | INTRAMUSCULAR | Status: DC | PRN

## 2011-08-25 MED ORDER — INSULIN ASPART 100 UNIT/ML ~~LOC~~ SOLN
0.0000 [IU] | Freq: Three times a day (TID) | SUBCUTANEOUS | Status: DC
  Administered 2011-08-25 – 2011-08-26 (×2): 2 [IU] via SUBCUTANEOUS

## 2011-08-25 MED ORDER — DEXTRAN 40 IN SALINE 10-0.9 % IV SOLN
10.0000 mL/kg | INTRAVENOUS | Status: DC
  Filled 2011-08-25: qty 1000

## 2011-08-25 MED ORDER — MIDAZOLAM HCL 5 MG/5ML IJ SOLN
INTRAMUSCULAR | Status: DC | PRN
  Administered 2011-08-25: 2 mg via INTRAVENOUS

## 2011-08-25 MED ORDER — PROPOFOL 10 MG/ML IV EMUL
INTRAVENOUS | Status: DC | PRN
  Administered 2011-08-25 (×2): 50 mg via INTRAVENOUS

## 2011-08-25 MED ORDER — ACETAMINOPHEN 325 MG PO TABS
325.0000 mg | ORAL_TABLET | ORAL | Status: DC | PRN
  Administered 2011-08-25: 650 mg via ORAL
  Filled 2011-08-25: qty 2

## 2011-08-25 MED ORDER — OXYCODONE-ACETAMINOPHEN 5-325 MG PO TABS: 1.0000 | ORAL_TABLET | ORAL | Status: AC | PRN

## 2011-08-25 MED ORDER — AMLODIPINE BESYLATE 10 MG PO TABS
10.0000 mg | ORAL_TABLET | Freq: Every day | ORAL | Status: DC
  Filled 2011-08-25: qty 1

## 2011-08-25 MED ORDER — ONDANSETRON HCL 4 MG/2ML IJ SOLN: 4.0000 mg | Freq: Once | INTRAMUSCULAR | Status: DC | PRN

## 2011-08-25 MED ORDER — THROMBIN 20000 UNITS EX KIT
PACK | CUTANEOUS | Status: DC | PRN
  Administered 2011-08-25: 20000 [IU] via TOPICAL

## 2011-08-25 MED ORDER — HEMOSTATIC AGENTS (NO CHARGE) OPTIME
TOPICAL | Status: DC | PRN
  Administered 2011-08-25: 1 via TOPICAL

## 2011-08-25 MED ORDER — THROMBIN 20000 UNITS EX SOLR
CUTANEOUS | Status: AC
  Filled 2011-08-25: qty 20000

## 2011-08-25 MED ORDER — DOPAMINE-DEXTROSE 3.2-5 MG/ML-% IV SOLN: 3.0000 ug/kg/min | INTRAVENOUS | Status: DC | PRN

## 2011-08-25 MED ORDER — INSULIN ASPART 100 UNIT/ML ~~LOC~~ SOLN: 0.0000 [IU] | Freq: Every day | SUBCUTANEOUS | Status: DC

## 2011-08-25 MED ORDER — ONDANSETRON HCL 4 MG/2ML IJ SOLN
INTRAMUSCULAR | Status: DC | PRN
  Administered 2011-08-25: 4 mg via INTRAVENOUS

## 2011-08-25 MED ORDER — POTASSIUM CHLORIDE CRYS ER 20 MEQ PO TBCR: 20.0000 meq | EXTENDED_RELEASE_TABLET | Freq: Once | ORAL | Status: AC | PRN

## 2011-08-25 MED ORDER — ATORVASTATIN CALCIUM 80 MG PO TABS: 80.0000 mg | ORAL_TABLET | Freq: Every day | ORAL | Status: DC

## 2011-08-25 MED ORDER — SODIUM CHLORIDE 0.9 % IR SOLN
Status: DC | PRN
  Administered 2011-08-25: 09:00:00

## 2011-08-25 MED ORDER — SODIUM CHLORIDE 0.9 % IV SOLN: INTRAVENOUS | Status: DC

## 2011-08-25 MED ORDER — SUFENTANIL CITRATE 50 MCG/ML IV SOLN
INTRAVENOUS | Status: DC | PRN
  Administered 2011-08-25: 10 ug via INTRAVENOUS

## 2011-08-25 MED ORDER — PANTOPRAZOLE SODIUM 40 MG PO TBEC: 40.0000 mg | DELAYED_RELEASE_TABLET | Freq: Every day | ORAL | Status: DC

## 2011-08-25 MED ORDER — LIDOCAINE-EPINEPHRINE (PF) 1 %-1:200000 IJ SOLN
INTRAMUSCULAR | Status: DC | PRN
  Administered 2011-08-25: 9 mL

## 2011-08-25 MED ORDER — ASPIRIN 81 MG PO CHEW: 81.0000 mg | CHEWABLE_TABLET | Freq: Every day | ORAL | Status: DC

## 2011-08-25 MED ORDER — LORATADINE 10 MG PO TABS
10.0000 mg | ORAL_TABLET | Freq: Every day | ORAL | Status: DC | PRN
  Filled 2011-08-25: qty 1

## 2011-08-25 MED ORDER — DEXTROSE 5 % IV SOLN
1.5000 g | Freq: Two times a day (BID) | INTRAVENOUS | Status: AC
  Administered 2011-08-25 – 2011-08-26 (×2): 1.5 g via INTRAVENOUS
  Filled 2011-08-25 (×2): qty 1.5

## 2011-08-25 MED ORDER — INSULIN GLARGINE 100 UNIT/ML ~~LOC~~ SOLN
13.0000 [IU] | Freq: Every day | SUBCUTANEOUS | Status: DC
  Administered 2011-08-25: 13 [IU] via SUBCUTANEOUS

## 2011-08-25 MED ORDER — METOPROLOL TARTRATE 1 MG/ML IV SOLN: 2.0000 mg | INTRAVENOUS | Status: DC | PRN

## 2011-08-25 MED ORDER — LIDOCAINE HCL (CARDIAC) 20 MG/ML IV SOLN
INTRAVENOUS | Status: DC | PRN
  Administered 2011-08-25: 100 mg via INTRAVENOUS

## 2011-08-25 MED ORDER — PHENYLEPHRINE HCL 10 MG/ML IJ SOLN
10.0000 mg | INTRAVENOUS | Status: DC | PRN
  Administered 2011-08-25: 20 ug/min via INTRAVENOUS

## 2011-08-25 MED ORDER — PROTAMINE SULFATE 10 MG/ML IV SOLN
INTRAVENOUS | Status: DC | PRN
  Administered 2011-08-25: 40 mg via INTRAVENOUS

## 2011-08-25 MED ORDER — LEVOTHYROXINE SODIUM 150 MCG PO TABS
150.0000 ug | ORAL_TABLET | Freq: Every day | ORAL | Status: DC
  Administered 2011-08-26: 150 ug via ORAL
  Filled 2011-08-25 (×2): qty 1

## 2011-08-25 MED ORDER — EZETIMIBE-SIMVASTATIN 10-20 MG PO TABS
1.0000 | ORAL_TABLET | Freq: Every day | ORAL | Status: DC
  Filled 2011-08-25: qty 1

## 2011-08-25 MED ORDER — SODIUM CHLORIDE 0.9 % IV SOLN
INTRAVENOUS | Status: DC | PRN
  Administered 2011-08-25 (×2): via INTRAVENOUS

## 2011-08-25 SURGICAL SUPPLY — 49 items
ADH SKN CLS APL DERMABOND .7 (GAUZE/BANDAGES/DRESSINGS) ×1
BAG DECANTER FOR FLEXI CONT (MISCELLANEOUS) ×2 IMPLANT
CANISTER SUCTION 2500CC (MISCELLANEOUS) ×2 IMPLANT
CANNULA VESSEL W/WING WO/VALVE (CANNULA) ×2 IMPLANT
CATH ROBINSON RED A/P 18FR (CATHETERS) ×2 IMPLANT
CLIP TI MEDIUM 24 (CLIP) ×2 IMPLANT
CLIP TI WIDE RED SMALL 24 (CLIP) ×2 IMPLANT
CLOTH BEACON ORANGE TIMEOUT ST (SAFETY) ×2 IMPLANT
COVER SURGICAL LIGHT HANDLE (MISCELLANEOUS) ×4 IMPLANT
CRADLE DONUT ADULT HEAD (MISCELLANEOUS) ×2 IMPLANT
DERMABOND ADVANCED (GAUZE/BANDAGES/DRESSINGS) ×1
DERMABOND ADVANCED .7 DNX12 (GAUZE/BANDAGES/DRESSINGS) ×1 IMPLANT
DRAIN CHANNEL 15F RND FF W/TCR (WOUND CARE) IMPLANT
DRAPE WARM FLUID 44X44 (DRAPE) ×2 IMPLANT
ELECT REM PT RETURN 9FT ADLT (ELECTROSURGICAL) ×2
ELECTRODE REM PT RTRN 9FT ADLT (ELECTROSURGICAL) ×1 IMPLANT
EVACUATOR SILICONE 100CC (DRAIN) IMPLANT
GLOVE BIO SURGEON STRL SZ 6.5 (GLOVE) ×1 IMPLANT
GLOVE BIO SURGEON STRL SZ7.5 (GLOVE) ×2 IMPLANT
GLOVE BIOGEL PI IND STRL 7.0 (GLOVE) IMPLANT
GLOVE BIOGEL PI IND STRL 7.5 (GLOVE) ×1 IMPLANT
GLOVE BIOGEL PI INDICATOR 7.0 (GLOVE) ×3
GLOVE BIOGEL PI INDICATOR 7.5 (GLOVE) ×1
GLOVE ECLIPSE 6.5 STRL STRAW (GLOVE) ×2 IMPLANT
GOWN PREVENTION PLUS XXLARGE (GOWN DISPOSABLE) ×1 IMPLANT
GOWN STRL NON-REIN LRG LVL3 (GOWN DISPOSABLE) ×4 IMPLANT
KIT BASIN OR (CUSTOM PROCEDURE TRAY) ×2 IMPLANT
KIT ROOM TURNOVER OR (KITS) ×2 IMPLANT
NDL HYPO 25X1 1.5 SAFETY (NEEDLE) ×1 IMPLANT
NEEDLE HYPO 25X1 1.5 SAFETY (NEEDLE) ×2 IMPLANT
NS IRRIG 1000ML POUR BTL (IV SOLUTION) ×4 IMPLANT
PACK CAROTID (CUSTOM PROCEDURE TRAY) ×2 IMPLANT
PAD ARMBOARD 7.5X6 YLW CONV (MISCELLANEOUS) ×4 IMPLANT
PATCH HEMASHIELD 8X75 (Vascular Products) ×1 IMPLANT
SHUNT CAROTID BYPASS 10 (VASCULAR PRODUCTS) ×1 IMPLANT
SHUNT CAROTID BYPASS 12FRX15.5 (VASCULAR PRODUCTS) IMPLANT
SPECIMEN JAR SMALL (MISCELLANEOUS) ×2 IMPLANT
SPONGE SURGIFOAM ABS GEL 100 (HEMOSTASIS) ×1 IMPLANT
SUT PROLENE 6 0 BV (SUTURE) ×2 IMPLANT
SUT PROLENE 7 0 BV 1 (SUTURE) IMPLANT
SUT SILK 2 0 FS (SUTURE) IMPLANT
SUT VIC AB 3-0 SH 27 (SUTURE) ×2
SUT VIC AB 3-0 SH 27X BRD (SUTURE) ×1 IMPLANT
SUT VICRYL 4-0 PS2 18IN ABS (SUTURE) ×2 IMPLANT
SYR CONTROL 10ML LL (SYRINGE) ×2 IMPLANT
TOWEL OR 17X24 6PK STRL BLUE (TOWEL DISPOSABLE) ×2 IMPLANT
TOWEL OR 17X26 10 PK STRL BLUE (TOWEL DISPOSABLE) ×2 IMPLANT
TRAY FOLEY CATH 14FRSI W/METER (CATHETERS) ×2 IMPLANT
WATER STERILE IRR 1000ML POUR (IV SOLUTION) ×2 IMPLANT

## 2011-08-25 NOTE — Progress Notes (Signed)
Dr Gypsy Balsam notified patient has not taken Atenolol in about 2 1/2 months. Told to hold and not give this a.m.   Also notified of CBG 70-asymptomatic. No further orders received.

## 2011-08-25 NOTE — Progress Notes (Signed)
MEDICATION RELATED CONSULT NOTE - INITIAL   Pharmacy Consult for Antibiotic Adjustment Indication: Post-op Zinacef  Allergies  Allergen Reactions  . Fentanyl     "Almost died"   . Propofol     "Almost died"   Patient Measurements: Height: 5\' 11"  (180.3 cm) Weight: 202 lb 2.6 oz (91.7 kg) IBW/kg (Calculated) : 75.3  Vital Signs: Temp: 97.6 F (36.4 C) (06/18 1250) Temp src: Oral (06/18 1250) BP: 140/63 mmHg (06/18 1250) Pulse Rate: 58  (06/18 1250) Labs:  Basename 08/25/11 0608  WBC --  HGB --  HCT --  PLT --  APTT 43*  CREATININE --  LABCREA --  CREATININE --  CREAT24HRUR --  MG --  PHOS --  ALBUMIN --  PROT --  ALBUMIN --  AST --  ALT --  ALKPHOS --  BILITOT --  BILIDIR --  IBILI --   Estimated Creatinine Clearance: 50.6 ml/min (by C-G formula based on Cr of 1.8).   Microbiology: Recent Results (from the past 720 hour(s))  SURGICAL PCR SCREEN     Status: Abnormal   Collection Time   08/19/11 10:01 AM      Component Value Range Status Comment   MRSA, PCR NEGATIVE  NEGATIVE Final    Staphylococcus aureus POSITIVE (*) NEGATIVE Final    Medications:  Anti-infectives     Start     Dose/Rate Route Frequency Ordered Stop   08/25/11 1300   cefUROXime (ZINACEF) 1.5 g in dextrose 5 % 50 mL IVPB        1.5 g 100 mL/hr over 30 Minutes Intravenous Every 12 hours 08/25/11 1258 08/26/11 1259   08/24/11 1437   cefUROXime (ZINACEF) 1.5 g in dextrose 5 % 50 mL IVPB        1.5 g 100 mL/hr over 30 Minutes Intravenous 30 min pre-op 08/24/11 1437 08/25/11 0856          Assessment: 61 YOM s/p R CEA to receive Zinacef 1.5g IV q12 x2 doses post-op. Afebrile, Scr 1.80 on 6/12, estCrCL~51 ml/min. WBC wnl on 6/12.   Plan:  1. Continue Zinacef 1.5g IV q12h. 2. No further adjustment needed- Pharmacy will sign off.   Fayne Norrie 08/25/2011,1:02 PM

## 2011-08-25 NOTE — Telephone Encounter (Signed)
Patient notified via telephone call and sent letter,dpm

## 2011-08-25 NOTE — Transfer of Care (Signed)
Immediate Anesthesia Transfer of Care Note  Patient: Joshua [name] Age  Procedure(s) Performed: Procedure(s) (LRB): ENDARTERECTOMY CAROTID (Right)  Patient Location: PACU  Anesthesia Type: General  Level of Consciousness: awake, alert  and oriented  Airway & Oxygen Therapy: Patient Spontanous Breathing and Patient connected to nasal cannula oxygen  Post-op Assessment: Report given to PACU RN and Post -op Vital signs reviewed and stable  Post vital signs: Reviewed and stable  Complications: No apparent anesthesia complications

## 2011-08-25 NOTE — Anesthesia Postprocedure Evaluation (Signed)
  Anesthesia Post-op Note  Patient: Joshua [name]  Procedure(s) Performed: Procedure(s) (LRB): ENDARTERECTOMY CAROTID (Right)  Patient Location: PACU  Anesthesia Type: General  Level of Consciousness: awake, alert  and oriented  Airway and Oxygen Therapy: Patient Spontanous Breathing and Patient connected to nasal cannula oxygen  Post-op Pain: mild  Post-op Assessment: Post-op Vital signs reviewed  Post-op Vital Signs: Reviewed  Complications: No apparent anesthesia complications

## 2011-08-25 NOTE — Interval H&P Note (Signed)
History and Physical Interval Note:  08/25/2011 7:23 AM  Joshua [name]  has presented today for surgery, with the diagnosis of Right Internal Carotid Artery Stenosis  The various methods of treatment have been discussed with the patient and family. After consideration of risks, benefits and other options for treatment, the patient has consented to: RIGHT CAROTID ENDARTERECTOMY.  The patient's history has been reviewed, patient examined, no change in status, stable for surgery.  I have reviewed the patients' chart and labs.  Questions were answered to the patient's satisfaction.     [name]

## 2011-08-25 NOTE — H&P (View-Only) (Signed)
Vascular and Vein Specialist of [facility]  Patient name: Joshua [name] MRN: [id number] DOB: [birth year] Sex: male  REASON FOR CONSULT: critical right carotid stenosis. Referred by Dr. Carolanne Grumbling  HPI: Joshua [name] is a 61 y.o. male who was seen by Dr. Mayford Knife on 06/15/11. He does have a history of aortic stenosis. His most recent echo showed normal LV function with mild to moderate AS. He also had some chest pain briefly about a month ago but has had no further symptoms. He had a carotid duplex scan performed on 08/07/2011 which suggested a critical right carotid stenosis and he was sent for vascular consultation.  Of note he is right-handed. He denies any previous history of stroke, TIAs, expressive or receptive aphasia, or amaurosis fugax. He is on aspirin and Plavix. He has had previous PTCA. He also has undergone previous renal transplant.  Past Medical History  Diagnosis Date  . Stroke   . Myocardial infarct   . Coronary artery disease   . Diabetes mellitus   . Renal failure   . Carotid artery occlusion   . Hypothyroidism   . Hypertension   . ASCVD (arteriosclerotic cardiovascular disease)   . CHF (congestive heart failure)   . Cancer     prostate  . Hyperlipidemia     Family History  Problem Relation Age of Onset  . Diabetes Mother   . Hyperlipidemia Mother   . Hypertension Mother   . Heart disease Mother     before age 49  . Hyperlipidemia Father   . Hypertension Father   . Hyperlipidemia Sister   . Hypertension Sister   . Diabetes Brother   . Hypertension Brother   . Hyperlipidemia Brother     SOCIAL HISTORY: History  Substance Use Topics  . Smoking status: Never Smoker   . Smokeless tobacco: Never Used  . Alcohol Use: No    Allergies  Allergen Reactions  . Fentanyl     "Almost died"   . Propofol     "Almost died"     Current Outpatient Prescriptions  Medication Sig Dispense Refill  . amLODipine (NORVASC) 10 MG tablet Take 10 mg by mouth  daily.        Marland Kitchen aspirin 81 MG chewable tablet Chew 81 mg by mouth daily.        Marland Kitchen atenolol (TENORMIN) 50 MG tablet Take 50 mg by mouth daily.      Marland Kitchen atorvastatin (LIPITOR) 40 MG tablet Take 40 mg by mouth daily.        . clopidogrel (PLAVIX) 75 MG tablet Take 75 mg by mouth daily.      Marland Kitchen ezetimibe (ZETIA) 10 MG tablet Take 10 mg by mouth daily.        Marland Kitchen ezetimibe-simvastatin (VYTORIN) 10-20 MG per tablet Take 1 tablet by mouth at bedtime.        Marland Kitchen glipiZIDE (GLUCOTROL) 5 MG tablet Take 5 mg by mouth daily.      . insulin glargine (LANTUS) 100 UNIT/ML injection Inject 24 Units into the skin at bedtime.      Marland Kitchen levothyroxine (SYNTHROID, LEVOTHROID) 200 MCG tablet Take 200 mcg by mouth daily.      . mycophenolate (MYFORTIC) 360 MG TBEC Take 360 mg by mouth daily.      . pantoprazole (PROTONIX) 40 MG tablet Take 40 mg by mouth daily.      . simvastatin (ZOCOR) 20 MG tablet Take 20 mg by mouth every evening.      . sirolimus (  RAPAMUNE) 1 MG tablet Take 2 mg by mouth daily. Four tablets daily        REVIEW OF SYSTEMS: Arly.Keller ] denotes positive finding; [  ] denotes negative finding  CARDIOVASCULAR:  Arly.Keller ] chest pain- none in the last month.   [ ]  chest pressure   [ ]  palpitations   [ ]  orthopnea   [ ]  dyspnea on exertion   [ ]  claudication   [ ]  rest pain   [ ]  DVT   [ ]  phlebitis PULMONARY:   [ ]  productive cough   [ ]  asthma   [ ]  wheezing NEUROLOGIC:   [ ]  weakness  [ ]  paresthesias  [ ]  aphasia  [ ]  amaurosis  [ ]  dizziness HEMATOLOGIC:   [ ]  bleeding problems   [ ]  clotting disorders MUSCULOSKELETAL:  [ ]  joint pain   [ ]  joint swelling [ ]  leg swelling GASTROINTESTINAL: [ ]   blood in stool  [ ]   hematemesis GENITOURINARY:  [ ]   dysuria  [ ]   hematuria PSYCHIATRIC:  [ ]  history of major depression INTEGUMENTARY:  [ ]  rashes  [ ]  ulcers CONSTITUTIONAL:  [ ]  fever   [ ]  chills  PHYSICAL EXAM: Filed Vitals:   08/12/11 1432 08/12/11 1443  BP: 190/83 142/76  Pulse: 78 78  Temp: 98.3 F (36.8  C)   TempSrc: Oral   Resp:  12  Height: 5\' 11"  (1.803 m)   Weight: 200 lb (90.719 kg)    Body mass index is 27.89 kg/(m^2). GENERAL: The patient is a well-nourished male, in no acute distress. The vital signs are documented above. CARDIOVASCULAR: There is a regular rate and rhythm with a systolic murmur. He has bilateral carotid bruits. He has no significant lower extremity swelling. PULMONARY: There is good air exchange bilaterally without wheezing or rales. ABDOMEN: Soft and non-tender with normal pitched bowel sounds.  MUSCULOSKELETAL: There are no major deformities or cyanosis. NEUROLOGIC: No focal weakness or paresthesias are detected. SKIN: There are no ulcers or rashes noted. PSYCHIATRIC: The patient has a normal affect.  DATA:  I have independently interpreted his carotid duplex scan which shows a greater than 80% right carotid stenosis. End diastolic velocity is 178 cm/s suggesting a critical stenosis. He has no significant stenosis on the left. Both vertebral arteries are patent with normally directed flow. The carotid bifurcation is noted to be high however he has normal internal carotid artery visualized above the stenosis.  I've also reviewed his records from Dr. Norris Cross office. He has a history of coronary atherosclerosis and is followed closely by Dr. Mayford Knife. He has a history of hypertension and hypercholesterolemia which had been controlled closely.  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 I have recommended right carotid endarterectomy. I have reviewed the indications for carotid endarterectomy, that is to lower the risk of future stroke. I have also reviewed the potential complications of surgery, including but not limited to: bleeding, stroke (perioperative risk 1-2%), MI, nerve injury of other unpredictable medical problems. All of the  patients questions were answered and they are agreeable to proceed with surgery. Ideally I would like to stop his Plavix one-week preoperatively. Dr. Mayford Knife was not in the office today so I will call her tomorrow to discuss this. In addition I want to be sure that he is cleared from a cardiac standpoint given his history of aortic  stenosis and coronary artery disease. Once these issues are resolved we'll plan on scheduling him for right carotid endarterectomy. Tentatively were hoping to schedule him on 08/25/2011.     Inetha Maret S [facility] and Vein Specialists of [place] [facility]per: [phone]

## 2011-08-25 NOTE — Progress Notes (Signed)
Utilization review completed.  

## 2011-08-25 NOTE — Telephone Encounter (Signed)
Message copied by Fredrich Birks on Tue Aug 25, 2011  3:08 PM ------      Message from: Melene Plan      Created: Tue Aug 25, 2011  2:57 PM                   ----- Message -----         From: Marlowe Shores, [place]         Sent: 08/25/2011  11:31 AM           To: Melene Plan, RN            2 week F/U CEA Edilia Bo

## 2011-08-25 NOTE — Op Note (Signed)
NAME: Joshua [name]   MRN: [id number] DOB: 1951/02/05    DATE OF OPERATION: 08/25/2011  PREOP DIAGNOSIS: > 80% right carotid stenosis  POSTOP DIAGNOSIS: same  PROCEDURE: right carotid endarterectomy with Dacron patch angioplasty  SURGEON: Di Kindle. Edilia Bo, MD, FACS  ASSIST: Della Goo PA  ANESTHESIA: Gen.   EBL: minimal  INDICATIONS: Joshua [name] is a 61 y.o. male who was found to have a greater than 80% right carotid stenosis. He was asymptomatic. Right carotid endarterectomy was recommended in order to lower his risk of future stroke.  FINDINGS: the stenosis was fairly high in the internal carotid artery and was smooth. It was approximately 80%.  TECHNIQUE: The patient was brought to the operating room and received a general anesthetic. Arterial line had been placed by anesthesia. After careful positioning, the right neck was prepped and draped in the usual sterile fashion. An incision was made along the anterior border of the sternocleidomastoid and the dissection was carried down to the common carotid artery which was dissected free and controlled with a Rummel tourniquet. Facial vein was divided between 2-0 silk ties. At the bifurcation was fairly high. The plaque also extended fairly high up the internal carotid artery. The patient was heparinized. Clamps were then placed on the internal, external, and common carotid artery. A longitudinal arteriotomy was made in the common carotid artery. This was extended up into the internal carotid artery. The plaque extended higher and therefore had to extend my dissection and exposed the artery higher up. A 10 shunt was placed into the internal carotid artery and then back bled and then placed in the common carotid artery and secured with Rummel tourniquet. An endarterectomy plane was established proximally. The plaque was sharply divided. Eversion endarterectomy was performed of the external carotid artery. Distally the plaque extended  very high and elected to remove the shunt in order to get better exposure distally. Shunt was removed and the internal carotid artery clamped high up. Plaque was removed. Artery was irrigated with copious amounts of heparin and dextran all loose debris removed. No tacking sutures were required. I elected then to place a shunt. This was initially put in the internal carotid artery and then back bled. It was then placed into the common carotid artery and secured with Rummel tourniquet. Flow was reestablished the shunt once the endarterectomy was completed Dacron patch was sewn using continuous 6-0 Prolene suture. Prior to completing the patch closure the shunt was removed. The artery was backbled and flushed appropriately and the anastomosis completed. Reestablished first to the external carotid artery and then to the internal carotid artery there was good Doppler flow distal to the patch and good pulse. The wounds were irrigated with amounts of saline. The deep layer was closed with running 3-0 Vicryl. The platysma was closed with running 3-0 Vicryl. The skin was closed with a subcuticular stitch. Dermabond was applied. The patient was neurologically intact. And sponge counts were correct. The patient was transferred recovery in stable condition.  Waverly Ferrari, MD, FACS [facility] Specialists of [facility]  DATE OF DICTATION:   08/25/2011

## 2011-08-25 NOTE — Discharge Instructions (Signed)
Stroke Prevention Some medical conditions and some behaviors are associated with an increased chance of having a stroke. You may prevent a stroke by making healthy choices and managing medical conditions. You can reduce your risk of having a stroke by:  Staying physically active. It is recommended that you get at least 30 minutes of activity on most or all days.   Stop smoking.   Limiting alcohol use. Moderate alcohol use is considered to be: No more than 2 drinks per day for men. No more than 1 drink per day for non-pregnant women.   DIET.  A low fat, low cholesterol, low salt and high fiber diet with servings of fruits and vegetables daily will help .   Managing your cholesterol levels.. Take any prescribed medications to control cholesterol as directed by your caregiver.   Managing your diabetes. Diabetic diet as recommended by the ADA. Take any prescribed medications to control diabetes as directed by your caregiver.   Controlling your high blood pressure (hypertension). It is very important to take any prescribed medications to control hypertension .   Aspirin: Take Aspirin daily as prescribed by your physician. Do not take aspirin with blood thinners unless prescribed by your Physician  You may be on "blood thinners" (anticoagulants) Coumadin, Plavix, Agrennox, Effient. All these medications are helpful in reducing the risk of forming abnormal blood clots. If you have the irregular heart rhythm of atrial fibrillation, you may be on a blood thinner.  Be sure you understand all your medication instructions.   SIGNS OF STROKE: SEEK IMMEDIATE MEDICAL CARE IF: You have sudden weakness or numbness of the face, arm, or leg, especially on 1 side of the body.  You have sudden confusion.  You have trouble speaking (aphasia) or understanding.  You have sudden trouble seeing in 1 or both eyes.  You have sudden trouble walking.  You have dizziness.  You have a loss of balance or coordination.   You have a sudden severe headache with no known cause.    You have new chest pain, angina, or an irregular heartbeat.   ANY OF THESE SYMPTOMS MAY REPRESENT A SERIOUS PROBLEM THAT IS AN EMERGENCY. Do not wait to see if the symptoms will go away. Get medical help right away. Call your local emergency services (911 in [place] DO NOT drive yourself to the hospital.

## 2011-08-25 NOTE — Preoperative (Signed)
Beta Blockers   Reason not to administer Beta Blockers:Not Applicable 

## 2011-08-26 ENCOUNTER — Encounter (HOSPITAL_COMMUNITY): Payer: Self-pay | Admitting: Vascular Surgery

## 2011-08-26 LAB — BASIC METABOLIC PANEL
CO2: 21 mEq/L (ref 19–32)
Chloride: 107 mEq/L (ref 96–112)
Creatinine, Ser: 1.78 mg/dL — ABNORMAL HIGH (ref 0.50–1.35)
GFR calc Af Amer: 46 mL/min — ABNORMAL LOW (ref >90)
Potassium: 3.8 mEq/L (ref 3.5–5.1)

## 2011-08-26 LAB — CBC
HCT: 29.2 % — ABNORMAL LOW (ref 39.0–52.0)
MCV: 75.3 fL — ABNORMAL LOW (ref 78.0–100.0)
Platelets: 192 10*3/uL (ref 150–400)
RBC: 3.88 MIL/uL — ABNORMAL LOW (ref 4.22–5.81)
WBC: 5.5 10*3/uL (ref 4.0–10.5)

## 2011-08-26 LAB — GLUCOSE, CAPILLARY

## 2011-08-26 MED ORDER — ATORVASTATIN CALCIUM 40 MG PO TABS: 40.0000 mg | ORAL_TABLET | Freq: Every day | ORAL | Status: AC

## 2011-08-26 MED ORDER — SODIUM CHLORIDE 0.9 % IJ SOLN
INTRAMUSCULAR | Status: AC
  Filled 2011-08-26: qty 20

## 2011-08-26 NOTE — Discharge Summary (Signed)
Agree with plans for D/C  Joshua [name]. Edilia Bo, MD, FACS Beeper [phone] 08/26/2011

## 2011-08-26 NOTE — Discharge Summary (Signed)
Vascular and Vein Specialists Discharge Summary   Patient ID:  Other Atienza MRN: [id number] DOB/AGE: November 29, 1950 61 y.o.  Admit date: 08/25/2011 Discharge date: 08/26/2011 Date of Surgery: 08/25/2011 Surgeon: Surgeon(s): Chuck Hint, MD  Admission Diagnosis: Right Internal Carotid Artery Stenosis  Discharge Diagnoses:  Right Internal Carotid Artery Stenosis  Secondary Diagnoses: Past Medical History  Diagnosis Date  . Coronary artery disease   . Diabetes mellitus   . Carotid artery occlusion   . Hypothyroidism   . Hypertension   . ASCVD (arteriosclerotic cardiovascular disease)   . CHF (congestive heart failure)   . Cancer     prostate  . Hyperlipidemia   . Myocardial infarct     about 2 yrs ago  . Anginal pain     last chest pain was a couple of months ago-just lasted less than a minute  . Stroke     "light stroke" about time of MI  . Renal failure     s/p renal transplant  . Aortic stenosis     moderate AS by 06/2011 echo (Dr. Armanda Magic)    Procedure(s): ENDARTERECTOMY CAROTID  Discharged Condition: good  HPI:  Joshua [name] is a 61 y.o. male who was seen by Dr. Mayford Knife on 06/15/11. He does have a history of aortic stenosis. His most recent echo showed normal LV function with mild to moderate AS. He also had some chest pain briefly about a month ago but has had no further symptoms. He had a carotid duplex scan performed on 08/07/2011 which suggested a critical right carotid stenosis and he was sent for vascular consultation.  Of note he is right-handed. He denies any previous history of stroke, TIAs, expressive or receptive aphasia, or amaurosis fugax. He is on aspirin and Plavix. He has had previous PTCA. He also has undergone previous renal transplant.  He is admitted for elective Rught CEA  Hospital Course:  Spiros Greenfeld is a 61 y.o. male is S/P Right Procedure(s): ENDARTERECTOMY CAROTID Extubated: POD # 0 Post-op wounds healing well Pt.  Ambulating, voiding and taking PO diet without difficulty. Pt pain controlled with PO pain meds. Labs as below Complications:none  Consults:     Significant Diagnostic Studies: CBC Lab Results  Component Value Date   WBC 5.5 08/26/2011   HGB 8.9* 08/26/2011   HCT 29.2* 08/26/2011   MCV 75.3* 08/26/2011   PLT 192 08/26/2011    BMET    Component Value Date/Time   NA 137 08/26/2011 0440   K 3.8 08/26/2011 0440   CL 107 08/26/2011 0440   CO2 21 08/26/2011 0440   GLUCOSE 125* 08/26/2011 0440   BUN 18 08/26/2011 0440   CREATININE 1.78* 08/26/2011 0440   CALCIUM 8.0* 08/26/2011 0440   GFRNONAA 40* 08/26/2011 0440   GFRAA 46* 08/26/2011 0440   COAG Lab Results  Component Value Date   INR 1.02 08/19/2011   INR 1.08 03/25/2009   INR 1.07 03/23/2009     Disposition:  Discharge to :Home  Discharge Orders    Future Appointments: Provider: Department: Dept Phone: Center:   09/16/2011 8:30 AM Chuck Hint, MD [facility] [phone] [facility]     Future Orders Please Complete By Expires   Resume previous diet      Driving Restrictions      Comments:   No driving for 2 weeks   Lifting restrictions      Comments:   No lifting for 4 weeks   Call MD for:  temperature >100.5  Call MD for:  redness, tenderness, or signs of infection (pain, swelling, bleeding, redness, odor or green/yellow discharge around incision site)      Call MD for:  severe or increased pain, loss or decreased feeling  in affected limb(s)      Increase activity slowly      Comments:   Walk with assistance use walker or cane as needed   May shower       Scheduling Instructions:   Thursday   No dressing needed      may wash over wound with mild soap and water      CAROTID Sugery: Call MD for difficulty swallowing or speaking; weakness in arms or legs that is a new symtom; severe headache.  If you have increased swelling in the neck and/or  are having difficulty breathing, CALL 911         Aren, Pryde  Home Medication Instructions VWU:[id number]   Printed on:08/26/11 0754  Medication Information                    aspirin 81 MG chewable tablet Chew 81 mg by mouth daily.             amLODipine (NORVASC) 10 MG tablet Take 10 mg by mouth daily.             atenolol (TENORMIN) 50 MG tablet Take 50 mg by mouth daily.           pantoprazole (PROTONIX) 40 MG tablet Take 40 mg by mouth daily.           sirolimus (RAPAMUNE) 1 MG tablet Take 4 mg by mouth daily.            insulin glargine (LANTUS) 100 UNIT/ML injection Inject 26 Units into the skin at bedtime.            clopidogrel (PLAVIX) 75 MG tablet Take 75 mg by mouth daily.           levothyroxine (SYNTHROID, LEVOTHROID) 150 MCG tablet Take 150 mcg by mouth daily.           mycophenolate (MYFORTIC) 180 MG EC tablet Take 720 mg by mouth 2 (two) times daily.            insulin lispro (HUMALOG) 100 UNIT/ML injection Inject 8 Units into the skin 3 (three) times daily with meals as needed.           fexofenadine (ALLEGRA) 180 MG tablet Take 1 tablet (180 mg total) by mouth daily.           oxyCODONE-acetaminophen (ROXICET) 5-325 MG per tablet Take 1 tablet by mouth every 4 (four) hours as needed for pain.           atorvastatin (LIPITOR) 40 MG tablet Take 1 tablet (40 mg total) by mouth daily at 6 PM.            Verbal and written Discharge instructions given to the patient. Wound care per Discharge AVS Follow-up Information    Follow up with DICKSON,CHRISTOPHER S, MD in 2 weeks. (office will arrange -sent)    Contact information:   [address] [place] [address] [phone]          Signed: Marlowe Shores 08/26/2011, 7:54 AM

## 2011-08-26 NOTE — Progress Notes (Signed)
VASCULAR PROGRESS NOTE  SUBJECTIVE: No specific complaints. Has ambulated this AM. Tolerated diet. No problems swallowing. Able to void.  PHYSICAL EXAM: Filed Vitals:   08/26/11 0006 08/26/11 0007 08/26/11 0437 08/26/11 0500  BP:  170/67 138/68   Pulse:  80 70   Temp: 99.3 F (37.4 C)   99.5 F (37.5 C)  TempSrc: Oral   Oral  Resp:  19 16   Height:      Weight:      SpO2:  95% 92%    Right neck swollen but soft. Incision ok. Neuro intact  LABS: Lab Results  Component Value Date   WBC 5.5 08/26/2011   HGB 8.9* 08/26/2011   HCT 29.2* 08/26/2011   MCV 75.3* 08/26/2011   PLT 192 08/26/2011   Lab Results  Component Value Date   CREATININE 1.78* 08/26/2011   Lab Results  Component Value Date   INR 1.02 08/19/2011   CBG (last 3)   Basename 08/25/11 2200 08/25/11 1648 08/25/11 1140  GLUCAP 131* 134* 99   ASSESSMENT/PLAN: 1. 1 Day Post-Op s/p: Right CEA 2. Plan D/C today. I have arranged a F/U apt in 2 weeks.   Waverly Ferrari, MD, FACS Beeper: [phone] 08/26/2011

## 2011-08-26 NOTE — Progress Notes (Signed)
Patient d/c'd home per MD order. Patient's VVS and assessment WNL and neuro WNL; smile symmetrical, tongue  Midline, and grips equal. Patient and family given d/c instructions and all questions answered. Patient d/c'd home via wheelchair with wife.

## 2011-09-15 ENCOUNTER — Encounter: Payer: Self-pay | Admitting: Vascular Surgery

## 2011-09-16 ENCOUNTER — Encounter: Payer: Self-pay | Admitting: Vascular Surgery

## 2011-09-16 ENCOUNTER — Ambulatory Visit (INDEPENDENT_AMBULATORY_CARE_PROVIDER_SITE_OTHER): Payer: Medicare Other | Admitting: Vascular Surgery

## 2011-09-16 VITALS — BP 177/78 | HR 102 | Resp 20 | Ht 71.0 in | Wt 204.0 lb

## 2011-09-16 DIAGNOSIS — I6529 Occlusion and stenosis of unspecified carotid artery: Secondary | ICD-10-CM

## 2011-09-16 DIAGNOSIS — Z48812 Encounter for surgical aftercare following surgery on the circulatory system: Secondary | ICD-10-CM

## 2011-09-16 NOTE — Assessment & Plan Note (Signed)
The patient is now 2 weeks status post right carotid endarterectomy. He is doing well. He had no significant stenosis on the left carotid. I will arrange a follow up carotid duplex scan in 6 months. Assuming that looks good he can just have a yearly duplex scan. Is not a smoker. His blood pressure and cholesterol are followed by his primary care physician. He is on aspirin and Plavix.

## 2011-09-16 NOTE — Progress Notes (Signed)
Vascular and Vein Specialist of [facility]  Patient name: Joshua [name] MRN: [id number] DOB: 22-Mar-1950 Sex: male  REASON FOR VISIT: follow up after right carotid endarterectomy.  HPI: Joshua [name] is a 61 y.o. male who had an asymptomatic greater than 80% right carotid stenosis.  He underwent right carotid endarterectomy with Dacron patch angioplasty on 08/25/2011. He did well postoperatively and was discharged on postoperative day #1. He has been doing well with no specific complaints except for an occasional headache but none recently. He has had some occasional chest pain which resolved fairly quickly. I have encouraged him to notify Dr. Mayford Knife if he has any further chest pain. He has had no focal weakness or paresthesias.   REVIEW OF SYSTEMS: Arly.Keller ] denotes positive finding; [  ] denotes negative finding  CARDIOVASCULAR:  Arly.Keller ] chest pain mild, stable  [ ]  dyspnea on exertion    CONSTITUTIONAL:  [ ]  fever   [ ]  chills  PHYSICAL EXAM: Filed Vitals:   09/16/11 0840  BP: 177/78  Pulse: 102  Resp: 20  Height: 5\' 11"  (1.803 m)  Weight: 204 lb (92.534 kg)   Body mass index is 28.45 kg/(m^2). GENERAL: The patient is a well-nourished male, in no acute distress. The vital signs are documented above. CARDIOVASCULAR: There is a regular rate and rhythm  PULMONARY: There is good air exchange bilaterally without wheezing or rales. His right neck incision is healing nicely. He has no focal weakness or paresthesias.  MEDICAL ISSUES:  Occlusion and stenosis of carotid artery without mention of cerebral infarction The patient is now 2 weeks status post right carotid endarterectomy. He is doing well. He had no significant stenosis on the left carotid. I will arrange a follow up carotid duplex scan in 6 months. Assuming that looks good he can just have a yearly duplex scan. Is not a smoker. His blood pressure and cholesterol are followed by his primary care physician. He is on aspirin and  Plavix.   Vaniah Chambers S [facility] of [place] Beeper: [phone]

## 2012-03-22 ENCOUNTER — Encounter: Payer: Self-pay | Admitting: Neurosurgery

## 2012-03-23 ENCOUNTER — Ambulatory Visit (INDEPENDENT_AMBULATORY_CARE_PROVIDER_SITE_OTHER): Payer: Medicare Other | Admitting: Neurosurgery

## 2012-03-23 ENCOUNTER — Other Ambulatory Visit: Payer: Self-pay | Admitting: *Deleted

## 2012-03-23 ENCOUNTER — Encounter: Payer: Self-pay | Admitting: Neurosurgery

## 2012-03-23 ENCOUNTER — Other Ambulatory Visit (INDEPENDENT_AMBULATORY_CARE_PROVIDER_SITE_OTHER): Payer: Medicare Other | Admitting: *Deleted

## 2012-03-23 VITALS — BP 162/79 | HR 67 | Resp 16 | Ht 71.0 in | Wt 197.0 lb

## 2012-03-23 DIAGNOSIS — I6529 Occlusion and stenosis of unspecified carotid artery: Secondary | ICD-10-CM

## 2012-03-23 DIAGNOSIS — Z48812 Encounter for surgical aftercare following surgery on the circulatory system: Secondary | ICD-10-CM

## 2012-03-23 NOTE — Progress Notes (Signed)
[facility] [place] Carotid Office Note  CC: Carotid surveillance Referring Physician: Edilia Bo  History of Present Illness: 62 year old male patient of Dr. Edilia Bo status post right CEA in June of 2013. The patient denies any signs or symptoms of CVA, TIA, amaurosis fugax or any neural deficit. The patient denies any new medical diagnoses or recent surgery.  Past Medical History  Diagnosis Date  . Coronary artery disease   . Diabetes mellitus   . Carotid artery occlusion   . Hypothyroidism   . Hypertension   . ASCVD (arteriosclerotic cardiovascular disease)   . CHF (congestive heart failure)   . Cancer     prostate  . Hyperlipidemia   . Myocardial infarct     about 2 yrs ago  . Anginal pain     last chest pain was a couple of months ago-just lasted less than a minute  . Stroke     "light stroke" about time of MI  . Renal failure     s/p renal transplant  . Aortic stenosis     moderate AS by 06/2011 echo (Dr. Armanda Magic)    ROS: [x]  Positive   [ ]  Denies    General: [ ]  Weight loss, [ ]  Fever, [ ]  chills Neurologic: [ ]  Dizziness, [ ]  Blackouts, [ ]  Seizure [ ]  Stroke, [ ]  "Mini stroke", [ ]  Slurred speech, [ ]  Temporary blindness; [ ]  weakness in arms or legs, [ ]  Hoarseness Cardiac: [ ]  Chest pain/pressure, [ ]  Shortness of breath at rest [ ]  Shortness of breath with exertion, [ ]  Atrial fibrillation or irregular heartbeat Vascular: [ ]  Pain in legs with walking, [ ]  Pain in legs at rest, [ ]  Pain in legs at night,  [ ]  Non-healing ulcer, [ ]  Blood clot in vein/DVT,   Pulmonary: [ ]  Home oxygen, [ ]  Productive cough, [ ]  Coughing up blood, [ ]  Asthma,  [ ]  Wheezing Musculoskeletal:  [ ]  Arthritis, [ ]  Low back pain, [ ]  Joint pain Hematologic: [ ]  Easy Bruising, [ ]  Anemia; [ ]  Hepatitis Gastrointestinal: [ ]  Blood in stool, [ ]  Gastroesophageal Reflux/heartburn, [ ]  Trouble swallowing Urinary: [ ]  chronic Kidney disease, [ ]  on HD - [ ]  MWF or [ ]   TTHS, [ ]  Burning with urination, [ ]  Difficulty urinating Skin: [ ]  Rashes, [ ]  Wounds Psychological: [ ]  Anxiety, [ ]  Depression   Social History History  Substance Use Topics  . Smoking status: Never Smoker   . Smokeless tobacco: Never Used  . Alcohol Use: No    Family History Family History  Problem Relation Age of Onset  . Diabetes Mother   . Hyperlipidemia Mother   . Hypertension Mother   . Heart disease Mother     before age 17  . Hyperlipidemia Father   . Hypertension Father   . Heart disease Father   . Hyperlipidemia Sister   . Hypertension Sister   . Diabetes Brother   . Hypertension Brother   . Hyperlipidemia Brother     Allergies  Allergen Reactions  . Fentanyl     "Almost died"   . Propofol     "Almost died"     Current Outpatient Prescriptions  Medication Sig Dispense Refill  . amLODipine (NORVASC) 10 MG tablet Take 10 mg by mouth daily.        Marland Kitchen aspirin 81 MG chewable tablet Chew 81 mg by mouth daily.        Marland Kitchen  atenolol (TENORMIN) 50 MG tablet Take 50 mg by mouth daily.      Marland Kitchen atorvastatin (LIPITOR) 40 MG tablet Take 1 tablet (40 mg total) by mouth daily at 6 PM.      . clopidogrel (PLAVIX) 75 MG tablet Take 75 mg by mouth daily.      . fexofenadine (ALLEGRA) 180 MG tablet Take 1 tablet (180 mg total) by mouth daily.  14 tablet  0  . insulin glargine (LANTUS) 100 UNIT/ML injection Inject 26 Units into the skin at bedtime.       . insulin lispro (HUMALOG) 100 UNIT/ML injection Inject 8 Units into the skin 3 (three) times daily with meals as needed.      Marland Kitchen levothyroxine (SYNTHROID, LEVOTHROID) 150 MCG tablet Take 150 mcg by mouth daily.      . mycophenolate (MYFORTIC) 180 MG EC tablet Take 720 mg by mouth 2 (two) times daily.       . pantoprazole (PROTONIX) 40 MG tablet Take 40 mg by mouth daily.      . sirolimus (RAPAMUNE) 1 MG tablet Take 4 mg by mouth daily.         Physical Examination  Filed Vitals:   03/23/12 1012  BP: 162/79  Pulse: 67    Resp: 16    Body mass index is 27.48 kg/(m^2).  General:  WDWN in NAD Gait: Normal HEENT: WNL Eyes: Pupils equal Pulmonary: normal non-labored breathing , without Rales, rhonchi,  wheezing Cardiac: RRR, without  Murmurs, rubs or gallops; Abdomen: soft, NT, no masses Skin: no rashes, ulcers noted  Vascular Exam Pulses: 3+ radial pulses bilaterally Carotid bruits: Carotid pulses to auscultation no bruits are heard Extremities without ischemic changes, no Gangrene , no cellulitis; no open wounds;  Musculoskeletal: no muscle wasting or atrophy   Neurologic: A&O X 3; Appropriate Affect ; SENSATION: normal; MOTOR FUNCTION:  moving all extremities equally. Speech is fluent/normal  Non-Invasive Vascular Imaging CAROTID DUPLEX 03/23/2012  Right ICA 0 - 19% stenosis Left ICA 0 - 19% stenosis   ASSESSMENT/PLAN: Asymptomatic patient will followup in 6 months for repeat postop carotid duplex. The patient's questions were encouraged and answered, he is in agreement with this plan.  Lauree Chandler ANP   Clinic MD: Edilia Bo

## 2012-09-21 ENCOUNTER — Other Ambulatory Visit: Payer: Medicare Other

## 2012-09-21 ENCOUNTER — Ambulatory Visit: Payer: Medicare Other | Admitting: Neurosurgery

## 2012-10-05 ENCOUNTER — Other Ambulatory Visit: Payer: Medicare Other

## 2012-10-05 ENCOUNTER — Ambulatory Visit: Payer: Medicare Other | Admitting: Vascular Surgery

## 2012-10-18 ENCOUNTER — Encounter: Payer: Self-pay | Admitting: Vascular Surgery

## 2012-10-19 ENCOUNTER — Other Ambulatory Visit: Payer: Medicare Other

## 2012-10-19 ENCOUNTER — Ambulatory Visit: Payer: Medicare Other | Admitting: Vascular Surgery

## 2012-10-25 ENCOUNTER — Encounter: Payer: Self-pay | Admitting: Vascular Surgery

## 2012-10-26 ENCOUNTER — Other Ambulatory Visit (INDEPENDENT_AMBULATORY_CARE_PROVIDER_SITE_OTHER): Payer: Medicare Other | Admitting: *Deleted

## 2012-10-26 ENCOUNTER — Other Ambulatory Visit: Payer: Medicare Other

## 2012-10-26 ENCOUNTER — Encounter: Payer: Self-pay | Admitting: Family

## 2012-10-26 ENCOUNTER — Ambulatory Visit: Payer: Medicare Other | Admitting: Vascular Surgery

## 2012-10-26 ENCOUNTER — Ambulatory Visit (INDEPENDENT_AMBULATORY_CARE_PROVIDER_SITE_OTHER): Payer: Medicare Other | Admitting: Family

## 2012-10-26 DIAGNOSIS — Z48812 Encounter for surgical aftercare following surgery on the circulatory system: Secondary | ICD-10-CM

## 2012-10-26 DIAGNOSIS — I6529 Occlusion and stenosis of unspecified carotid artery: Secondary | ICD-10-CM

## 2012-10-26 NOTE — Progress Notes (Signed)
Established Carotid Patient  Previous Carotid surgery: right carotid endarterectomy June, 2013, Dr. Edilia Bo.  History of Present Illness  Joshua [name] is a 62 y.o. male who had a right carotid endarterectomy June, 2013 by Dr. Edilia Bo.  Patient has Negative history of TIA or stroke symptom.  The patient denies amaurosis fugax or monocular blindness.  The patient  denies facial drooping.  Pt. denies hemiplegia.  The patient denies receptive or expressive aphasia.  Pt. denies weakness in extremities.  Patient denies any newmMedical or Surgical History:   Pt Diabetic: Yes Pt smoker: No  Pt meds include: Statin : atorvastatin Betablocker: Yes ASA: Yes Other anticoagulants/antiplatelets: Plavix   Past Medical History  Diagnosis Date  . Coronary artery disease   . Diabetes mellitus   . Carotid artery occlusion   . Hypothyroidism   . Hypertension   . ASCVD (arteriosclerotic cardiovascular disease)   . CHF (congestive heart failure)   . Cancer     prostate  . Hyperlipidemia   . Myocardial infarct     about 2 yrs ago  . Anginal pain     last chest pain was a couple of months ago-just lasted less than a minute  . Stroke     "light stroke" about time of MI  . Renal failure     s/p renal transplant  . Aortic stenosis     moderate AS by 06/2011 echo (Dr. Armanda Magic)    Social History History  Substance Use Topics  . Smoking status: Never Smoker   . Smokeless tobacco: Never Used  . Alcohol Use: No    Family History Family History  Problem Relation Age of Onset  . Diabetes Mother   . Hyperlipidemia Mother   . Hypertension Mother   . Heart disease Mother     before age 17  . Hyperlipidemia Father   . Hypertension Father   . Heart disease Father   . Hyperlipidemia Sister   . Hypertension Sister   . Diabetes Brother   . Hypertension Brother   . Hyperlipidemia Brother     Surgical History Past Surgical History  Procedure Laterality Date  . Coronary stent  placement    . Nephrectomy transplanted organ    . Pr vein bypass graft,aorto-fem-pop  1980    fem pop   . Skin cancer excision    . Endarterectomy  08/25/2011    Procedure: ENDARTERECTOMY CAROTID;  Surgeon: Chuck Hint, MD;  Location: [facility] OR;  Service: Vascular;  Laterality: Right;  . Carotid endarterectomy  08/25/11    RIGHT  cea    Allergies  Allergen Reactions  . Fentanyl     "Almost died"   . Propofol     "Almost died"     Current Outpatient Prescriptions  Medication Sig Dispense Refill  . amLODipine (NORVASC) 10 MG tablet Take 10 mg by mouth daily.        Marland Kitchen aspirin 81 MG chewable tablet Chew 81 mg by mouth daily.        Marland Kitchen atenolol (TENORMIN) 50 MG tablet Take 50 mg by mouth daily.      . clopidogrel (PLAVIX) 75 MG tablet Take 75 mg by mouth daily.      . insulin glargine (LANTUS) 100 UNIT/ML injection Inject 26 Units into the skin at bedtime.       . insulin lispro (HUMALOG) 100 UNIT/ML injection Inject 8 Units into the skin 3 (three) times daily with meals as needed.      Marland Kitchen  levothyroxine (SYNTHROID, LEVOTHROID) 150 MCG tablet Take 150 mcg by mouth daily.      . mycophenolate (MYFORTIC) 180 MG EC tablet Take 720 mg by mouth 2 (two) times daily.       . pantoprazole (PROTONIX) 40 MG tablet Take 40 mg by mouth daily.      . sirolimus (RAPAMUNE) 1 MG tablet Take 4 mg by mouth daily.       . fexofenadine (ALLEGRA) 180 MG tablet Take 1 tablet (180 mg total) by mouth daily.  14 tablet  0   No current facility-administered medications for this visit.    Review of Systems : [x]  Positive   [ ]  Denies  General:[ ]  Weight loss,  [ ]  Weight gain, [ ]  Loss of appetite, [ ]  Fever, [ ]  chills  Neurologic: [ ]  Dizziness, [ ]  Blackouts, [ ]  Headaches, [ ]  Seizure [ ]  Stroke, [ ]  "Mini stroke", [ ]  Slurred speech, [ ]  Temporary blindness;  [ ] weakness,  Ear/Nose/Throat: [ ]  Change in hearing, [ ]  Nose bleeds, [ ]  Hoarseness  Vascular:[ ]  Pain in legs with walking, [ ]  Pain  in feet while lying flat , [ ]   Non-healing ulcer, [ ]  Blood clot in vein,    Pulmonary: [ ]  Home oxygen, [ ]   Productive cough, [ ]  Bronchitis, [ ]  Coughing up blood,  [ ]  Asthma, [ ]  Wheezing  Musculoskeletal:  [ ]  Arthritis, [ ]  Joint pain, [ ]  low back pain  Cardiac: [ ]  Chest pain, [ ]  Shortness of breath when lying flat, [ ]  Shortness of breath with exertion, [ ]  Palpitations, [ ]  Heart murmur, [ ]   Atrial fibrillation  Hematologic:[ ]  Easy Bruising, [ ]  Anemia; [ ]  Hepatitis  Psychiatric: [ ]   Depression, [ ]  Anxiety   Gastrointestinal: [ ]  Black stool, [ ]  Blood in stool, [ ]  Peptic ulcer disease,  [ ]  Gastroesophageal Reflux, [ ]  Trouble swallowing, [ ]  Diarrhea, [ ]  Constipation  Urinary: [ ]  chronic Kidney disease, [ ]  on HD, [ ]  Burning with urination, [ ]  Frequent urination, [ ]  Difficulty urinating;   Skin: [ ]  Rashes, [ ]  Wounds    Physical Examination  Filed Vitals:   10/26/12 1310  BP: 163/76  Pulse: 67  Resp: 18    General: WDWN male in NAD GAIT: normal Eyes: PERRLA Pulmonary:  CTAB, Negative  Rales, Negative rhonchi, & Negative wheezing,  Cardiac: regular Rhythm ,  Negative Murmurs.   Vascular:      Vessel Right Left  Radial Palpable Palpable  Carotid Palpable, without bruit Palpable, with bruit  Aorta Not palpable N/A  Femoral Palpable Palpable  Popliteal Not palpable Not palpable  PT Not Palpable Not Palpable  DP Palpable Palpable    Gastrointestinal: soft, nontender, BS WNL, no r/g,  negative masses;   Musculoskeletal: Negative muscle atrophy/wasting. M/S 5/5 throughout, Extremities without ischemic changes.  Neurologic: A&O X 3; Appropriate Affect ; SENSATION ;normal; MOTOR FUNCTION: 5/5 strength Speech is normal, CN 2-12 intact, Pain and light touch intact in extremities  Motor exam as listed above.   Non-Invasive Vascular Imaging CAROTID DUPLEX 10/26/2012   Right ICA : widely patent right CEA site. Left ICA less than 40%  stenosis These findings are Unchanged from previous exam   Assessment: Joshua [name] is a 62 y.o. male who presents with:Asymptomatic post right CEA June, 2013 and less than 40% stenosis left ICA. The  ICA stenosis is  Unchanged from previous exam.  Plan: After discussing today's carotid Duplex results and the results of his history and physical exam it was decided that patient will follow-up in 1 year with Carotid Duplex scan.   I discussed in depth with the patient the nature of atherosclerosis, and emphasized the importance of maximal medical management including strict control of blood pressure, blood glucose, and lipid levels, obtaining regular exercise, and cessation of smoking.  The patient is aware that without maximal medical management the underlying atherosclerotic disease process will progress, limiting the benefit of any interventions. Pt was given verbal information regarding stroke symptoms and prevention. Thank you for allowing [place] to participate in this patient's care.  Charisse March, RN, MSN, FNP-C [facility] [place] Office: [phone]  Clinic Physician: Edilia Bo  10/26/2012 2:37 PM  VASCULAR QUALITY INITIATIVE FOLLOW UP DATA:  Current smoker: [  ] yes  Arly.Keller  ] no  Living status: [ X ]  Home  [  ] Nursing home  [  ] Homeless    MEDS:  ASA [ X ] yes  [  ] no- [  ] medical reason  [  ] non compliant  STATIN  Arly.Keller  ] yes  [  ] no- [  ] medical reason  [  ] non compliant  Beta blocker [ X ] yes  [  ] no- [  ] medical reason  [  ] non compliant  ACE inhibitor [  ] yes  Arly.Keller  ] no- [  ] medical reason  [  ] non compliant  P2Y12 Antagonist [  ] none  [ X ] clopidogrel-Plavix  [  ] ticlopidine-Ticlid   [  ] prasugrel-Effient  [  ] ticagrelor- Brilinta    Anticoagulant [ X ] None  [  ] warfarin  [  ] rivaroxaban-Xarelto [  ] dabigatran- Pradaxa  Neurologic event since D/C:  [ X ] no  [  ] yes: [  ] eye event  [  ] cortical event  [  ] VB  event  [  ] non specific event  [  ] right  [  ] left  [  ] TIA  [  ] stroke  Date:   Modified Rankin Score: 0  MI since D/C: [ X ] no  [  ] troponin only  [  ] EKG or clinical  Cranial nerve injury: Arly.Keller  ] none  [  ] resolved  [  ] persistent  Duplex CEA site: [  ] no  [ X ] yes - PSV= 0.54  EDV= ICA/CCA ratio:   Stenosis= Arly.Keller  ] <40% [  ] 40-59% [  ] 60-79%  [  ] > 80%  [  ]  Occluded  CEA site re-operation:  Arly.Keller  ] no   [  ] yes- date of re-op:  CEA site PCI:   [ X ] no   [  ] yes- date of PCI:

## 2012-12-08 ENCOUNTER — Other Ambulatory Visit: Payer: Self-pay | Admitting: General Surgery

## 2012-12-08 MED ORDER — AMLODIPINE BESYLATE 10 MG PO TABS: 10.0000 mg | ORAL_TABLET | Freq: Every day | ORAL | Status: DC

## 2012-12-08 MED ORDER — CLOPIDOGREL BISULFATE 75 MG PO TABS: 75.0000 mg | ORAL_TABLET | Freq: Every day | ORAL | Status: DC

## 2013-01-09 ENCOUNTER — Encounter: Payer: Self-pay | Admitting: Cardiology

## 2013-01-11 ENCOUNTER — Encounter: Payer: Self-pay | Admitting: Cardiology

## 2013-01-11 ENCOUNTER — Ambulatory Visit (INDEPENDENT_AMBULATORY_CARE_PROVIDER_SITE_OTHER): Payer: Medicare Other | Admitting: Cardiology

## 2013-01-11 VITALS — BP 158/89 | HR 93 | Ht 71.0 in | Wt 190.0 lb

## 2013-01-11 DIAGNOSIS — E785 Hyperlipidemia, unspecified: Secondary | ICD-10-CM

## 2013-01-11 DIAGNOSIS — I10 Essential (primary) hypertension: Secondary | ICD-10-CM

## 2013-01-11 DIAGNOSIS — I5189 Other ill-defined heart diseases: Secondary | ICD-10-CM | POA: Insufficient documentation

## 2013-01-11 DIAGNOSIS — I2510 Atherosclerotic heart disease of native coronary artery without angina pectoris: Secondary | ICD-10-CM

## 2013-01-11 DIAGNOSIS — I359 Nonrheumatic aortic valve disorder, unspecified: Secondary | ICD-10-CM

## 2013-01-11 DIAGNOSIS — I350 Nonrheumatic aortic (valve) stenosis: Secondary | ICD-10-CM | POA: Insufficient documentation

## 2013-01-11 NOTE — Patient Instructions (Signed)
Your physician recommends that you return for lab work tomorrow 01/12/13. The lab is open at 730 am until 530 pm. This is a fasting lab.  Your physician has requested that you have an echocardiogram. Echocardiography is a painless test that uses sound waves to create images of your heart. It provides your doctor with information about the size and shape of your heart and how well your heart's chambers and valves are working. This procedure takes approximately one hour. There are no restrictions for this procedure. This needs to be done 06/2013.  Please check your BP daily for one week and then call in the results to our office.   Your physician wants you to follow-up in: 6 months with Dr. Sherlyn Lick will receive a reminder letter in the mail two months in advance. If you don't receive a letter, please call our office to schedule the follow-up appointment.

## 2013-01-11 NOTE — Progress Notes (Signed)
[address] [place], [place]  [address] Phone: [phone] Fax:  [phone]  Date:  01/11/2013   ID:  Joshua [name], DOB [birth year], MRN [id number]  PCP:  Quintella Reichert, MD  Cardiologist:  Armanda Magic, MD     History of Present Illness: Joshua [name] is a 62 y.o. male with a history of ASCAD, HTN ,mild AS and dyslipidemia.  He has been doing well.  He says that he did have 2 episodes of chest pain about a month ago.  One episode while watching TV and the other while eating breakfast.  He denied any SOB, diaphoresis or nausea.  The episodes lasted about 5 minutes and resolved spontaneously.  He walks for exericse and has no exertional CP or SOB.  He denies any dizziness, palpitations or syncope.  He has no LE edema.    Wt Readings from Last 3 Encounters:  01/11/13 190 lb (86.183 kg)  10/26/12 190 lb (86.183 kg)  03/23/12 197 lb (89.359 kg)     Past Medical History  Diagnosis Date  . Diabetes mellitus   . Carotid artery occlusion   . ASCVD (arteriosclerotic cardiovascular disease)   . CHF (congestive heart failure)   . Cancer     prostate  . Myocardial infarct     about 2 yrs ago  . Anginal pain     last chest pain was a couple of months ago-just lasted less than a minute  . Stroke     "light stroke" about time of MI  . Renal failure     s/p renal transplant  . Aortic stenosis     moderate AS by 06/2011 echo (Dr. Armanda Magic)  . Coronary artery disease 03/2009    2 vessel s/p BMS to LAD in setting of NSTEMI, RCA chronically occluded  . PVD (peripheral vascular disease)     s/p fempop 1980's  . Hypothyroidism     s/p papillary thyroid CA excision  . Aortic stenosis, mild   . Hypertension   . Hyperlipidemia     Current Outpatient Prescriptions  Medication Sig Dispense Refill  . amLODipine (NORVASC) 10 MG tablet Take 1 tablet (10 mg total) by mouth daily.  30 tablet  6  . aspirin 81 MG chewable tablet Chew 81 mg by mouth daily.        Marland Kitchen atenolol  (TENORMIN) 50 MG tablet Take 50 mg by mouth daily.      . clopidogrel (PLAVIX) 75 MG tablet Take 1 tablet (75 mg total) by mouth daily.  30 tablet  6  . fexofenadine (ALLEGRA) 180 MG tablet Take 1 tablet (180 mg total) by mouth daily.  14 tablet  0  . insulin glargine (LANTUS) 100 UNIT/ML injection Inject 24 Units into the skin at bedtime.       . insulin lispro (HUMALOG) 100 UNIT/ML injection Inject 8 Units into the skin 3 (three) times daily with meals as needed.      Marland Kitchen levothyroxine (SYNTHROID, LEVOTHROID) 150 MCG tablet Take 150 mcg by mouth daily.      . mycophenolate (MYFORTIC) 180 MG EC tablet Take 720 mg by mouth 2 (two) times daily.       . pantoprazole (PROTONIX) 40 MG tablet Take 40 mg by mouth daily.      . sirolimus (RAPAMUNE) 1 MG tablet Take 4 mg by mouth daily.       Marland Kitchen atorvastatin (LIPITOR) 80 MG tablet Take 1 tablet by mouth daily.  No current facility-administered medications for this visit.    Allergies:    Allergies  Allergen Reactions  . Fentanyl     "Almost died"   . Propofol     "Almost died"     Social History:  The patient  reports that he has never smoked. He has never used smokeless tobacco. He reports that he does not drink alcohol or use illicit drugs.   Family History:  The patient's family history includes Diabetes in his brother and mother; Heart disease in his father and mother; Hyperlipidemia in his brother, father, mother, and sister; Hypertension in his brother, father, mother, and sister.   ROS:  Please see the history of present illness.      All other systems reviewed and negative.   PHYSICAL EXAM: VS:  BP 158/89  Pulse 93  Ht 5\' 11"  (1.803 m)  Wt 190 lb (86.183 kg)  BMI 26.51 kg/m2 Well nourished, well developed, in no acute distress HEENT: normal Neck: no JVD Cardiac:  normal S1, S2; RRR; 2/6 SM at RUSB to LLSB, occasional ectopy Lungs:  clear to auscultation bilaterally, no wheezing, rhonchi or rales Abd: soft, nontender, no  hepatomegaly Ext: no edema Skin: warm and dry Neuro:  CNs 2-12 intact, no focal abnormalities noted  EKG:  NSR with frequent PAC's and occasional PVC's and LVH by voltage with repolarization abnormality     ASSESSMENT AND PLAN:  1. ASCAD stable with no exertional angina.  The 2 episodes of very brief pain a month ago were nonexertional and I do not think are related to his heart.  - continue ASA 2. HTN - BP elevated  - I have asked him to check his BP daily for a week and call with the results  - continue amlodipine/atenolol 3.  Dyslipidemia  - continue atorvastatin  - check fasting lipid panel/ALT 4.  Moderate AS - asymptomatic  - recheck echo 06/2013  Followup with me in 6 months  Signed, Armanda Magic, MD 01/11/2013 9:37 AM

## 2013-01-12 ENCOUNTER — Emergency Department (HOSPITAL_COMMUNITY): Payer: Medicare Other

## 2013-01-12 ENCOUNTER — Emergency Department (HOSPITAL_COMMUNITY)
Admission: EM | Admit: 2013-01-12 | Discharge: 2013-01-12 | Disposition: A | Discharge status: Home / Self Care | Payer: Medicare Other | Attending: Emergency Medicine | Admitting: Emergency Medicine

## 2013-01-12 ENCOUNTER — Encounter: Payer: Self-pay | Admitting: General Surgery

## 2013-01-12 ENCOUNTER — Other Ambulatory Visit: Payer: Self-pay | Admitting: General Surgery

## 2013-01-12 ENCOUNTER — Other Ambulatory Visit (INDEPENDENT_AMBULATORY_CARE_PROVIDER_SITE_OTHER): Payer: Medicare Other

## 2013-01-12 ENCOUNTER — Encounter (HOSPITAL_COMMUNITY): Payer: Self-pay | Admitting: Emergency Medicine

## 2013-01-12 DIAGNOSIS — E119 Type 2 diabetes mellitus without complications: Secondary | ICD-10-CM | POA: Insufficient documentation

## 2013-01-12 DIAGNOSIS — Z9861 Coronary angioplasty status: Secondary | ICD-10-CM | POA: Insufficient documentation

## 2013-01-12 DIAGNOSIS — E785 Hyperlipidemia, unspecified: Secondary | ICD-10-CM | POA: Insufficient documentation

## 2013-01-12 DIAGNOSIS — Z791 Long term (current) use of non-steroidal anti-inflammatories (NSAID): Secondary | ICD-10-CM | POA: Insufficient documentation

## 2013-01-12 DIAGNOSIS — Z79899 Other long term (current) drug therapy: Secondary | ICD-10-CM

## 2013-01-12 DIAGNOSIS — I10 Essential (primary) hypertension: Secondary | ICD-10-CM | POA: Insufficient documentation

## 2013-01-12 DIAGNOSIS — Z7982 Long term (current) use of aspirin: Secondary | ICD-10-CM | POA: Insufficient documentation

## 2013-01-12 DIAGNOSIS — I509 Heart failure, unspecified: Secondary | ICD-10-CM | POA: Insufficient documentation

## 2013-01-12 DIAGNOSIS — M545 Low back pain, unspecified: Secondary | ICD-10-CM | POA: Insufficient documentation

## 2013-01-12 DIAGNOSIS — Z87448 Personal history of other diseases of urinary system: Secondary | ICD-10-CM | POA: Insufficient documentation

## 2013-01-12 DIAGNOSIS — I252 Old myocardial infarction: Secondary | ICD-10-CM | POA: Insufficient documentation

## 2013-01-12 DIAGNOSIS — Z794 Long term (current) use of insulin: Secondary | ICD-10-CM | POA: Insufficient documentation

## 2013-01-12 DIAGNOSIS — I2510 Atherosclerotic heart disease of native coronary artery without angina pectoris: Secondary | ICD-10-CM | POA: Insufficient documentation

## 2013-01-12 DIAGNOSIS — Z8673 Personal history of transient ischemic attack (TIA), and cerebral infarction without residual deficits: Secondary | ICD-10-CM | POA: Insufficient documentation

## 2013-01-12 DIAGNOSIS — Z8546 Personal history of malignant neoplasm of prostate: Secondary | ICD-10-CM | POA: Insufficient documentation

## 2013-01-12 DIAGNOSIS — Z923 Personal history of irradiation: Secondary | ICD-10-CM | POA: Insufficient documentation

## 2013-01-12 DIAGNOSIS — E039 Hypothyroidism, unspecified: Secondary | ICD-10-CM | POA: Insufficient documentation

## 2013-01-12 DIAGNOSIS — Z7902 Long term (current) use of antithrombotics/antiplatelets: Secondary | ICD-10-CM | POA: Insufficient documentation

## 2013-01-12 LAB — URINALYSIS, ROUTINE W REFLEX MICROSCOPIC
Bilirubin Urine: NEGATIVE
Hgb urine dipstick: NEGATIVE
Nitrite: NEGATIVE
Protein, ur: 30 mg/dL — AB
Specific Gravity, Urine: 1.021 (ref 1.005–1.030)
Urobilinogen, UA: 1 mg/dL (ref 0.0–1.0)

## 2013-01-12 LAB — LIPID PANEL
Cholesterol: 122 mg/dL (ref 0–200)
LDL Cholesterol: 60 mg/dL (ref 0–99)
Triglycerides: 126 mg/dL (ref 0.0–149.0)
VLDL: 25.2 mg/dL (ref 0.0–40.0)

## 2013-01-12 LAB — HEPATIC FUNCTION PANEL
ALT: 12 U/L (ref 0–53)
Albumin: 3.8 g/dL (ref 3.5–5.2)
Bilirubin, Direct: 0 mg/dL (ref 0.0–0.3)
Total Protein: 7.7 g/dL (ref 6.0–8.3)

## 2013-01-12 LAB — URINE MICROSCOPIC-ADD ON

## 2013-01-12 MED ORDER — IBUPROFEN 800 MG PO TABS: 800.0000 mg | ORAL_TABLET | Freq: Three times a day (TID) | ORAL | Status: DC

## 2013-01-12 MED ORDER — METHOCARBAMOL 500 MG PO TABS: 500.0000 mg | ORAL_TABLET | Freq: Two times a day (BID) | ORAL | Status: DC

## 2013-01-12 NOTE — ED Notes (Signed)
Pt c/o lower back pain x 1 week that is chronic in nature with more severity x 1 week

## 2013-01-12 NOTE — ED Notes (Signed)
Discharge instructions given but the pt  Reports that the pa has not been in to see her yet

## 2013-01-12 NOTE — ED Notes (Signed)
Pt given water ok'd per PA

## 2013-01-12 NOTE — ED Notes (Signed)
Pt in xray

## 2013-01-12 NOTE — ED Provider Notes (Signed)
CSN: [id number]     Arrival date & time 01/12/13  1800 History  This chart was scribed for non-physician practitioner Dierdre Forth, PA-C working with Shon Baton, MD by Clydene Laming, ED Scribe. This patient was seen in room TR09C/TR09C and the patient's care was started at 6:58 PM.   Chief Complaint  Patient presents with  . Back Pain    The history is provided by the patient and medical records. No language interpreter was used.   HPI Comments: Muhammad Vacca is a 62 y.o. male who presents to the Emergency Department complaining of waxing and waning bilateral lower back pain onset one week ago. He states that the pain is not radiating to the legs. Pt denies any physical act that could have caused pain. He does small tasks around his home such as vacuuming, but has not done any heavy lifting. He reports taking aleve for relief which helps a fair amount, but the pain returns when the medication wears off. Pt denies numbness or weakness of the legs, dysuria, loss of urine or bowel movement, or difficulty walking. Pt also denies any iv drug use. He denies any prior back surgeries or diagnoses. Pt has a hx of cardiac stint and takes Plavix. Pt is a diabetic. He has a hx of prostate cancer treated with radiation four years ago. Pt received a kidney transplant 13 years ago.   Past Medical History  Diagnosis Date  . Diabetes mellitus   . Carotid artery occlusion   . ASCVD (arteriosclerotic cardiovascular disease)   . CHF (congestive heart failure)   . Cancer     prostate  . Myocardial infarct     about 2 yrs ago  . Anginal pain     last chest pain was a couple of months ago-just lasted less than a minute  . Stroke     "light stroke" about time of MI  . Renal failure     s/p renal transplant  . Aortic stenosis     moderate AS by 06/2011 echo (Dr. Armanda Magic)  . Coronary artery disease 03/2009    2 vessel s/p BMS to LAD in setting of NSTEMI, RCA chronically occluded  . PVD  (peripheral vascular disease)     s/p fempop 1980's  . Hypothyroidism     s/p papillary thyroid CA excision  . Aortic stenosis, mild   . Hypertension   . Hyperlipidemia   . Aortic stenosis     moderate by echo 06/2012  . Diastolic dysfunction    Past Surgical History  Procedure Laterality Date  . Coronary stent placement    . Nephrectomy transplanted organ    . Pr vein bypass graft,aorto-fem-pop  1980    fem pop   . Skin cancer excision    . Endarterectomy  08/25/2011    Procedure: ENDARTERECTOMY CAROTID;  Surgeon: Chuck Hint, MD;  Location: [facility] OR;  Service: Vascular;  Laterality: Right;  . Carotid endarterectomy  08/25/11    RIGHT  cea  . Thyroidectomy     Family History  Problem Relation Age of Onset  . Diabetes Mother   . Hyperlipidemia Mother   . Hypertension Mother   . Heart disease Mother     before age 72  . Hyperlipidemia Father   . Hypertension Father   . Heart disease Father   . Hyperlipidemia Sister   . Hypertension Sister   . Diabetes Brother   . Hypertension Brother   . Hyperlipidemia Brother  History  Substance Use Topics  . Smoking status: Never Smoker   . Smokeless tobacco: Never Used  . Alcohol Use: No    Review of Systems  Constitutional: Negative for fever and fatigue.  Eyes: Negative for visual disturbance.  Respiratory: Negative for chest tightness and shortness of breath.   Cardiovascular: Negative for chest pain.  Gastrointestinal: Negative for nausea, vomiting, abdominal pain and diarrhea.  Endocrine: Negative for polydipsia, polyphagia and polyuria.  Genitourinary: Negative for dysuria, urgency, frequency and hematuria.  Musculoskeletal: Positive for back pain. Negative for gait problem, joint swelling, neck pain and neck stiffness.  Skin: Negative for rash.  Allergic/Immunologic: Positive for immunocompromised state.  Neurological: Negative for weakness, light-headedness, numbness and headaches.  All other systems reviewed  and are negative.    Allergies  Fentanyl and Propofol  Home Medications   Current Outpatient Rx  Name  Route  Sig  Dispense  Refill  . amLODipine (NORVASC) 10 MG tablet   Oral   Take 1 tablet (10 mg total) by mouth daily.   30 tablet   6   . aspirin 81 MG chewable tablet   Oral   Chew 81 mg by mouth daily.           Marland Kitchen atenolol (TENORMIN) 50 MG tablet   Oral   Take 50 mg by mouth daily.         Marland Kitchen atorvastatin (LIPITOR) 80 MG tablet   Oral   Take 1 tablet by mouth daily.         . clopidogrel (PLAVIX) 75 MG tablet   Oral   Take 1 tablet (75 mg total) by mouth daily.   30 tablet   6   . insulin glargine (LANTUS) 100 UNIT/ML injection   Subcutaneous   Inject 24 Units into the skin at bedtime.          . insulin lispro (HUMALOG) 100 UNIT/ML injection   Subcutaneous   Inject 6 Units into the skin 3 (three) times daily as needed (for sugars over 280).          Marland Kitchen levothyroxine (SYNTHROID, LEVOTHROID) 150 MCG tablet   Oral   Take 150 mcg by mouth daily.         . mycophenolate (MYFORTIC) 180 MG EC tablet   Oral   Take 720 mg by mouth 2 (two) times daily.          . pantoprazole (PROTONIX) 40 MG tablet   Oral   Take 40 mg by mouth daily.         . sirolimus (RAPAMUNE) 1 MG tablet   Oral   Take 4 mg by mouth daily.          Marland Kitchen EXPIRED: fexofenadine (ALLEGRA) 180 MG tablet   Oral   Take 1 tablet (180 mg total) by mouth daily.   14 tablet   0   . ibuprofen (ADVIL,MOTRIN) 800 MG tablet   Oral   Take 1 tablet (800 mg total) by mouth 3 (three) times daily.   21 tablet   0   . methocarbamol (ROBAXIN) 500 MG tablet   Oral   Take 1 tablet (500 mg total) by mouth 2 (two) times daily.   20 tablet   0    Triage Vitals:BP 166/79  Pulse 88  Temp(Src) 98 F (36.7 C) (Oral)  Resp 16  SpO2 98% Physical Exam  Nursing note and vitals reviewed. Constitutional: He appears well-developed and well-nourished. No distress.  HENT:  Head:  Normocephalic and atraumatic.  Mouth/Throat: Oropharynx is clear and moist. No oropharyngeal exudate.  Eyes: Conjunctivae are normal.  Neck: Normal range of motion. Neck supple.  Full ROM without pain  Cardiovascular: Normal rate, regular rhythm, normal heart sounds and intact distal pulses.   No murmur heard. Pulmonary/Chest: Effort normal and breath sounds normal. No respiratory distress. He has no wheezes.  Abdominal: Soft. He exhibits no distension. There is no tenderness.  No cva tenderness No palpable midline masses   Musculoskeletal:  Mildly decreased range of motion of the L-spine due to pain No tenderness to palpation of the spinous processes of the T-spine or L-spine Mild tenderness to palpation of the bilateral paraspinous muscles of the L-spine  Lymphadenopathy:    He has no cervical adenopathy.  Neurological: He is alert. He has normal reflexes.  Speech is clear and goal oriented, follows commands Normal strength in upper and lower extremities bilaterally including dorsiflexion and plantar flexion, strong and equal grip strength Sensation normal to light and sharp touch Moves extremities without ataxia, coordination intact Normal gait Normal balance   Skin: Skin is warm and dry. No rash noted. He is not diaphoretic. No erythema.    ED Course  Procedures (including critical care time) DIAGNOSTIC STUDIES: Oxygen Saturation is 98% on RA, normal by my interpretation.    COORDINATION OF CARE: 7:02 PM- Discussed treatment plan with pt at bedside. Pt verbalized understanding and agreement with plan.   Labs Review Labs Reviewed  URINALYSIS, ROUTINE W REFLEX MICROSCOPIC - Abnormal; Notable for the following:    Protein, ur 30 (*)    All other components within normal limits  URINE MICROSCOPIC-ADD ON - Abnormal; Notable for the following:    Bacteria, UA FEW (*)    All other components within normal limits   Imaging Review Dg Lumbar Spine Complete  01/12/2013    CLINICAL DATA:  62-year- male low back pain with no known injury. Initial encounter.  EXAM: LUMBAR SPINE - COMPLETE 4+ VIEW  COMPARISON:  [facility] CT Abdomen and Pelvis 10/26/2007  FINDINGS: Normal lumbar segmentation. Stable vertebral height and alignment. Chronic inferior endplate degeneration at L4 and L5. Relatively preserved disc spaces as before. No pars fracture. Visible lower thoracic levels appear intact. Sacral ala and SI joints within normal limits. Surgical clips about the pelvis and right lower abdomen. Right pelvic transplant kidney evident on comparison. Aortoiliac calcified atherosclerosis noted.  IMPRESSION: No acute osseous abnormality identified in the lumbar spine.   Electronically Signed   By: Augusto Gamble M.D.   On: 01/12/2013 20:02    EKG Interpretation   None       MDM   1. Lumbar back pain      Canyon Wengert presents with back pain for 1 week.  Patient has several concerning conditions. He is elderly, on a blood thinner, and has a history of cancer as well as kidney failure with subsequent  Transplant on immunosuppressive therapy.  Patient's back pain is located in the bilateral paraspinal muscles and he has no midline tenderness. He is neurovascularly intact without neurologic deficit on exam. He denies saddle anesthesia, difficulty ambulating or loss of bowel or bladder control.  Patient takes Plavix due to his cardiac history however he denies trauma or recent surgical procedures or instrumentation of his back. I doubt spinal hematoma. Patient with a history of diabetes on immunosuppressive therapy however he is afebrile and non-tachycardic without midline pain and I doubt epidural abscess.  Patient with  a history of prostate cancer 4 years ago.  Plain film lumbar spine without evidence of lytic lesions or compression fractures.  Lytic lesions are less likely at this time.  Urine without urinary tract infection and patient denies penile discharge or  testicular pain.   Patient with back pain and concerning red flags however evaluation today reveals no emergent conditions.  No neurological deficits and normal neuro exam.  Patient can walk without difficulty.  No loss of bowel or bladder control.  No concern for cauda equina.  No fever, night sweats, weight loss, or, IVDU.  I discussed my concerns at length with the patient and recommended close followup with his primary care and with orthopedics for further evaluation of back pain causes.  Patient reports understanding and reports he will return to the emergency room department if symptoms worsen.  Pt was discussed with Dr Ross Marcus who agrees with my work-up and plan to discharge.  It has been determined that no acute conditions requiring further emergency intervention are present at this time. The patient/guardian have been advised of the diagnosis and plan. We have discussed signs and symptoms that warrant return to the ED, such as changes or worsening in symptoms.   Vital signs are stable at discharge.   BP 173/84  Pulse 80  Temp(Src) 98.4 F (36.9 C) (Oral)  Resp 16  SpO2 95%  Patient/guardian has voiced understanding and agreed to follow-up with the PCP or specialist.  I personally performed the services described in this documentation, which was scribed in my presence. The recorded information has been reviewed and is accurate.         Dahlia Client Merlin Ege, PA-C 01/12/13 2257

## 2013-01-13 NOTE — ED Provider Notes (Signed)
Medical screening examination/treatment/procedure(s) were performed by non-physician practitioner and as supervising physician I was immediately available for consultation/collaboration.  EKG Interpretation   None        Dorrian Doggett F Raymel Cull, MD 01/13/13 1445 

## 2013-02-02 ENCOUNTER — Other Ambulatory Visit: Payer: Self-pay | Admitting: Cardiology

## 2013-02-09 ENCOUNTER — Emergency Department (INDEPENDENT_AMBULATORY_CARE_PROVIDER_SITE_OTHER)
Admission: EM | Admit: 2013-02-09 | Discharge: 2013-02-09 | Disposition: A | Discharge status: Home / Self Care | Payer: Medicare Other | Source: Home / Self Care | Attending: Family Medicine | Admitting: Family Medicine

## 2013-02-09 ENCOUNTER — Encounter (HOSPITAL_COMMUNITY): Payer: Self-pay | Admitting: Emergency Medicine

## 2013-02-09 DIAGNOSIS — S39012A Strain of muscle, fascia and tendon of lower back, initial encounter: Secondary | ICD-10-CM

## 2013-02-09 DIAGNOSIS — S335XXA Sprain of ligaments of lumbar spine, initial encounter: Secondary | ICD-10-CM

## 2013-02-09 MED ORDER — METHOCARBAMOL 500 MG PO TABS: 500.0000 mg | ORAL_TABLET | Freq: Two times a day (BID) | ORAL | Status: DC

## 2013-02-09 MED ORDER — IBUPROFEN 800 MG PO TABS: 800.0000 mg | ORAL_TABLET | Freq: Three times a day (TID) | ORAL | Status: DC

## 2013-02-09 NOTE — ED Provider Notes (Signed)
CSN: [id number]     Arrival date & time 02/09/13  1528 History   First MD Initiated Contact with Patient 02/09/13 1627     Chief Complaint  Patient presents with  . Back Pain   (Consider location/radiation/quality/duration/timing/severity/associated sxs/prior Treatment) Patient is a 62 y.o. male presenting with back pain. The history is provided by the patient.  Back Pain Location:  Lumbar spine Quality:  Shooting and stiffness Radiates to:  Does not radiate Pain severity:  Mild Chronicity:  Recurrent Context: twisting   Context comment:  Seen 1 mo ago in ER and given meds which help but ran out, requesting refill., no gi or gu sx.   Past Medical History  Diagnosis Date  . Diabetes mellitus   . Carotid artery occlusion   . ASCVD (arteriosclerotic cardiovascular disease)   . CHF (congestive heart failure)   . Cancer     prostate  . Myocardial infarct     about 2 yrs ago  . Anginal pain     last chest pain was a couple of months ago-just lasted less than a minute  . Stroke     "light stroke" about time of MI  . Renal failure     s/p renal transplant  . Aortic stenosis     moderate AS by 06/2011 echo (Dr. Armanda Magic)  . Coronary artery disease 03/2009    2 vessel s/p BMS to LAD in setting of NSTEMI, RCA chronically occluded  . PVD (peripheral vascular disease)     s/p fempop 1980's  . Hypothyroidism     s/p papillary thyroid CA excision  . Aortic stenosis, mild   . Hypertension   . Hyperlipidemia   . Aortic stenosis     moderate by echo 06/2012  . Diastolic dysfunction    Past Surgical History  Procedure Laterality Date  . Coronary stent placement    . Nephrectomy transplanted organ    . Pr vein bypass graft,aorto-fem-pop  1980    fem pop   . Skin cancer excision    . Endarterectomy  08/25/2011    Procedure: ENDARTERECTOMY CAROTID;  Surgeon: Chuck Hint, MD;  Location: [facility] OR;  Service: Vascular;  Laterality: Right;  . Carotid endarterectomy  08/25/11   RIGHT  cea  . Thyroidectomy     Family History  Problem Relation Age of Onset  . Diabetes Mother   . Hyperlipidemia Mother   . Hypertension Mother   . Heart disease Mother     before age 13  . Hyperlipidemia Father   . Hypertension Father   . Heart disease Father   . Hyperlipidemia Sister   . Hypertension Sister   . Diabetes Brother   . Hypertension Brother   . Hyperlipidemia Brother    History  Substance Use Topics  . Smoking status: Never Smoker   . Smokeless tobacco: Never Used  . Alcohol Use: No    Review of Systems  Constitutional: Negative.   Gastrointestinal: Negative.   Genitourinary: Negative.   Musculoskeletal: Positive for back pain. Negative for gait problem and neck pain.  Skin: Negative.     Allergies  Fentanyl and Propofol  Home Medications   Current Outpatient Rx  Name  Route  Sig  Dispense  Refill  . amLODipine (NORVASC) 10 MG tablet   Oral   Take 1 tablet (10 mg total) by mouth daily.   30 tablet   6   . aspirin 81 MG chewable tablet   Oral   Chew  81 mg by mouth daily.           Marland Kitchen atenolol (TENORMIN) 50 MG tablet   Oral   Take 50 mg by mouth daily.         Marland Kitchen atorvastatin (LIPITOR) 80 MG tablet      TAKE 1 TABLET BY MOUTH ONCE DAILY   30 tablet   0   . clopidogrel (PLAVIX) 75 MG tablet   Oral   Take 1 tablet (75 mg total) by mouth daily.   30 tablet   6   . EXPIRED: fexofenadine (ALLEGRA) 180 MG tablet   Oral   Take 1 tablet (180 mg total) by mouth daily.   14 tablet   0   . ibuprofen (ADVIL,MOTRIN) 800 MG tablet   Oral   Take 1 tablet (800 mg total) by mouth 3 (three) times daily.   21 tablet   0   . ibuprofen (ADVIL,MOTRIN) 800 MG tablet   Oral   Take 1 tablet (800 mg total) by mouth 3 (three) times daily.   30 tablet   0   . insulin glargine (LANTUS) 100 UNIT/ML injection   Subcutaneous   Inject 24 Units into the skin at bedtime.          . insulin lispro (HUMALOG) 100 UNIT/ML injection    Subcutaneous   Inject 6 Units into the skin 3 (three) times daily as needed (for sugars over 280).          Marland Kitchen levothyroxine (SYNTHROID, LEVOTHROID) 150 MCG tablet   Oral   Take 150 mcg by mouth daily.         . methocarbamol (ROBAXIN) 500 MG tablet   Oral   Take 1 tablet (500 mg total) by mouth 2 (two) times daily.   20 tablet   0   . methocarbamol (ROBAXIN) 500 MG tablet   Oral   Take 1 tablet (500 mg total) by mouth 2 (two) times daily.   20 tablet   1   . mycophenolate (MYFORTIC) 180 MG EC tablet   Oral   Take 720 mg by mouth 2 (two) times daily.          . pantoprazole (PROTONIX) 40 MG tablet   Oral   Take 40 mg by mouth daily.         . sirolimus (RAPAMUNE) 1 MG tablet   Oral   Take 4 mg by mouth daily.           BP 142/99  Pulse 69  Temp(Src) 97.8 F (36.6 C) (Oral)  Resp 16  SpO2 100% Physical Exam  Nursing note and vitals reviewed. Constitutional: He is oriented to person, place, and time. He appears well-developed and well-nourished.  Abdominal: Soft. Bowel sounds are normal.  Musculoskeletal: He exhibits tenderness.       Lumbar back: He exhibits decreased range of motion, tenderness and spasm. He exhibits no swelling, no edema, no deformity and normal pulse.       Back:  Neurological: He is alert and oriented to person, place, and time.  Skin: Skin is warm and dry.    ED Course  Procedures (including critical care time) Labs Review Labs Reviewed - No data to display Imaging Review No results found.  EKG Interpretation    Date/Time:    Ventricular Rate:    PR Interval:    QRS Duration:   QT Interval:    QTC Calculation:   R Axis:     Text  Interpretation:              MDM      Linna Hoff, MD 02/09/13 [phone]

## 2013-02-09 NOTE — ED Notes (Signed)
C/o back pain that radiates to Left abd area. Was seen in the hospital a month ago for the same issue Has used therapy and heating pad as treatment but no relief. States he was given a pain medication when he was at hospital which worked well

## 2013-02-22 ENCOUNTER — Other Ambulatory Visit: Payer: Self-pay | Admitting: Urology

## 2013-02-22 DIAGNOSIS — C61 Malignant neoplasm of prostate: Secondary | ICD-10-CM

## 2013-03-03 ENCOUNTER — Encounter (HOSPITAL_COMMUNITY)
Admission: RE | Admit: 2013-03-03 | Discharge: 2013-03-03 | Disposition: A | Discharge status: Home / Self Care | Payer: Medicare Other | Source: Ambulatory Visit | Attending: Urology | Admitting: Urology

## 2013-03-03 DIAGNOSIS — C61 Malignant neoplasm of prostate: Secondary | ICD-10-CM | POA: Insufficient documentation

## 2013-03-03 MED ORDER — TECHNETIUM TC 99M MEDRONATE IV KIT
26.0000 | PACK | Freq: Once | INTRAVENOUS | Status: AC | PRN
  Administered 2013-03-03: 26 via INTRAVENOUS

## 2013-03-07 ENCOUNTER — Ambulatory Visit: Payer: Self-pay | Admitting: Internal Medicine

## 2013-03-08 ENCOUNTER — Other Ambulatory Visit: Payer: Self-pay | Admitting: Cardiology

## 2013-03-11 ENCOUNTER — Emergency Department (HOSPITAL_COMMUNITY)
Admission: EM | Admit: 2013-03-11 | Discharge: 2013-03-11 | Disposition: A | Discharge status: Home / Self Care | Payer: Medicare Other | Attending: Emergency Medicine | Admitting: Emergency Medicine

## 2013-03-11 ENCOUNTER — Encounter (HOSPITAL_COMMUNITY): Payer: Self-pay | Admitting: Emergency Medicine

## 2013-03-11 DIAGNOSIS — Z7982 Long term (current) use of aspirin: Secondary | ICD-10-CM | POA: Insufficient documentation

## 2013-03-11 DIAGNOSIS — I10 Essential (primary) hypertension: Secondary | ICD-10-CM | POA: Insufficient documentation

## 2013-03-11 DIAGNOSIS — I252 Old myocardial infarction: Secondary | ICD-10-CM | POA: Insufficient documentation

## 2013-03-11 DIAGNOSIS — I509 Heart failure, unspecified: Secondary | ICD-10-CM | POA: Insufficient documentation

## 2013-03-11 DIAGNOSIS — I739 Peripheral vascular disease, unspecified: Secondary | ICD-10-CM | POA: Insufficient documentation

## 2013-03-11 DIAGNOSIS — Z9889 Other specified postprocedural states: Secondary | ICD-10-CM | POA: Insufficient documentation

## 2013-03-11 DIAGNOSIS — Z79899 Other long term (current) drug therapy: Secondary | ICD-10-CM | POA: Insufficient documentation

## 2013-03-11 DIAGNOSIS — Z940 Kidney transplant status: Secondary | ICD-10-CM | POA: Insufficient documentation

## 2013-03-11 DIAGNOSIS — E119 Type 2 diabetes mellitus without complications: Secondary | ICD-10-CM | POA: Insufficient documentation

## 2013-03-11 DIAGNOSIS — Z794 Long term (current) use of insulin: Secondary | ICD-10-CM | POA: Insufficient documentation

## 2013-03-11 DIAGNOSIS — Z8673 Personal history of transient ischemic attack (TIA), and cerebral infarction without residual deficits: Secondary | ICD-10-CM | POA: Insufficient documentation

## 2013-03-11 DIAGNOSIS — Z85850 Personal history of malignant neoplasm of thyroid: Secondary | ICD-10-CM | POA: Insufficient documentation

## 2013-03-11 DIAGNOSIS — Z9861 Coronary angioplasty status: Secondary | ICD-10-CM | POA: Insufficient documentation

## 2013-03-11 DIAGNOSIS — Z85828 Personal history of other malignant neoplasm of skin: Secondary | ICD-10-CM | POA: Insufficient documentation

## 2013-03-11 DIAGNOSIS — E039 Hypothyroidism, unspecified: Secondary | ICD-10-CM | POA: Insufficient documentation

## 2013-03-11 DIAGNOSIS — Z951 Presence of aortocoronary bypass graft: Secondary | ICD-10-CM | POA: Insufficient documentation

## 2013-03-11 DIAGNOSIS — C61 Malignant neoplasm of prostate: Secondary | ICD-10-CM | POA: Insufficient documentation

## 2013-03-11 DIAGNOSIS — I2510 Atherosclerotic heart disease of native coronary artery without angina pectoris: Secondary | ICD-10-CM | POA: Insufficient documentation

## 2013-03-11 DIAGNOSIS — E785 Hyperlipidemia, unspecified: Secondary | ICD-10-CM | POA: Insufficient documentation

## 2013-03-11 DIAGNOSIS — M549 Dorsalgia, unspecified: Secondary | ICD-10-CM | POA: Insufficient documentation

## 2013-03-11 DIAGNOSIS — Z7902 Long term (current) use of antithrombotics/antiplatelets: Secondary | ICD-10-CM | POA: Insufficient documentation

## 2013-03-11 LAB — BASIC METABOLIC PANEL
BUN: 22 mg/dL (ref 6–23)
CO2: 20 meq/L (ref 19–32)
Calcium: 9.5 mg/dL (ref 8.4–10.5)
Chloride: 94 mEq/L — ABNORMAL LOW (ref 96–112)
Creatinine, Ser: 1.68 mg/dL — ABNORMAL HIGH (ref 0.50–1.35)
GFR calc Af Amer: 49 mL/min — ABNORMAL LOW (ref >90)
GFR, EST NON AFRICAN AMERICAN: 42 mL/min — AB (ref >90)
GLUCOSE: 177 mg/dL — AB (ref 70–99)
Potassium: 4.5 mEq/L (ref 3.7–5.3)
Sodium: 132 mEq/L — ABNORMAL LOW (ref 137–147)

## 2013-03-11 MED ORDER — ONDANSETRON HCL 4 MG/2ML IJ SOLN
4.0000 mg | Freq: Once | INTRAMUSCULAR | Status: AC
  Administered 2013-03-11: 4 mg via INTRAVENOUS
  Filled 2013-03-11: qty 2

## 2013-03-11 MED ORDER — HYDROMORPHONE HCL PF 1 MG/ML IJ SOLN
1.0000 mg | Freq: Once | INTRAMUSCULAR | Status: AC
  Administered 2013-03-11: 1 mg via INTRAVENOUS
  Filled 2013-03-11: qty 1

## 2013-03-11 MED ORDER — OXYCODONE-ACETAMINOPHEN 10-325 MG PO TABS: 1.0000 | ORAL_TABLET | ORAL | Status: DC | PRN

## 2013-03-11 NOTE — ED Notes (Addendum)
Pt sts he is a cancer pt and he is unable to sleep due to pain. sts he was given pain meds by his doctor but not working. sts pain all over and in his back. sts unable to eat and vomiting.

## 2013-03-11 NOTE — Discharge Instructions (Signed)
Don't take your vicodin while taking your percocet. Back Pain, Adult Low back pain is very common. About 1 in 5 people have back pain.The cause of low back pain is rarely dangerous. The pain often gets better over time.About half of people with a sudden onset of back pain feel better in just 2 weeks. About 8 in 10 people feel better by 6 weeks.  CAUSES Some common causes of back pain include:  Strain of the muscles or ligaments supporting the spine.  Wear and tear (degeneration) of the spinal discs.  Arthritis.  Direct injury to the back. DIAGNOSIS Most of the time, the direct cause of low back pain is not known.However, back pain can be treated effectively even when the exact cause of the pain is unknown.Answering your caregiver's questions about your overall health and symptoms is one of the most accurate ways to make sure the cause of your pain is not dangerous. If your caregiver needs more information, he or she may order lab work or imaging tests (X-rays or MRIs).However, even if imaging tests show changes in your back, this usually does not require surgery. HOME CARE INSTRUCTIONS For many people, back pain returns.Since low back pain is rarely dangerous, it is often a condition that people can learn to [facility] their own.   Remain active. It is stressful on the back to sit or stand in one place. Do not sit, drive, or stand in one place for more than 30 minutes at a time. Take short walks on level surfaces as soon as pain allows.Try to increase the length of time you walk each day.  Do not stay in bed.Resting more than 1 or 2 days can delay your recovery.  Do not avoid exercise or work.Your body is made to move.It is not dangerous to be active, even though your back may hurt.Your back will likely heal faster if you return to being active before your pain is gone.  Pay attention to your body when you bend and lift. Many people have less discomfortwhen lifting if they bend  their knees, keep the load close to their bodies,and avoid twisting. Often, the most comfortable positions are those that put less stress on your recovering back.  Find a comfortable position to sleep. Use a firm mattress and lie on your side with your knees slightly bent. If you lie on your back, put a pillow under your knees.  Only take over-the-counter or prescription medicines as directed by your caregiver. Over-the-counter medicines to reduce pain and inflammation are often the most helpful.Your caregiver may prescribe muscle relaxant drugs.These medicines help dull your pain so you can more quickly return to your normal activities and healthy exercise.  Put ice on the injured area.  Put ice in a plastic bag.  Place a towel between your skin and the bag.  Leave the ice on for 15-20 minutes, 03-04 times a day for the first 2 to 3 days. After that, ice and heat may be alternated to reduce pain and spasms.  Ask your caregiver about trying back exercises and gentle massage. This may be of some benefit.  Avoid feeling anxious or stressed.Stress increases muscle tension and can worsen back pain.It is important to recognize when you are anxious or stressed and learn ways to manage it.Exercise is a great option. SEEK MEDICAL CARE IF:  You have pain that is not relieved with rest or medicine.  You have pain that does not improve in 1 week.  You have new symptoms.  You are generally not feeling well. SEEK IMMEDIATE MEDICAL CARE IF:   You have pain that radiates from your back into your legs.  You develop new bowel or bladder control problems.  You have unusual weakness or numbness in your arms or legs.  You develop nausea or vomiting.  You develop abdominal pain.  You feel faint. Document Released: 02/23/2005 Document Revised: 08/25/2011 Document Reviewed: 07/14/2010 [facility] Patient Information 2014 [place], [place].

## 2013-03-11 NOTE — ED Provider Notes (Signed)
CSN: [id number]     Arrival date & time 03/11/13  1129 History   First MD Initiated Contact with Patient 03/11/13 1147     Chief Complaint  Patient presents with  . Pain   (Consider location/radiation/quality/duration/timing/severity/associated sxs/prior Treatment) HPI Comments: Pt state that he was diagnosed with recurrent prostate cancer 3 weeks ago. Pt states that he dr gave him hydrocodone, but it is not working for pain;pt states that he has not had fever, vomiting, diarrhea, cough:pt state that he just can't sleep due to the pain:pt is post kidney transplant earlier this year  The history is provided by the patient. No language interpreter was used.    Past Medical History  Diagnosis Date  . Diabetes mellitus   . Carotid artery occlusion   . ASCVD (arteriosclerotic cardiovascular disease)   . CHF (congestive heart failure)   . Cancer     prostate  . Myocardial infarct     about 2 yrs ago  . Anginal pain     last chest pain was a couple of months ago-just lasted less than a minute  . Stroke     "light stroke" about time of MI  . Renal failure     s/p renal transplant  . Aortic stenosis     moderate AS by 06/2011 echo (Dr. Fransico Him)  . Coronary artery disease 03/2009    2 vessel s/p BMS to LAD in setting of NSTEMI, RCA chronically occluded  . PVD (peripheral vascular disease)     s/p fempop 1980's  . Hypothyroidism     s/p papillary thyroid CA excision  . Aortic stenosis, mild   . Hypertension   . Hyperlipidemia   . Aortic stenosis     moderate by echo 06/2012  . Diastolic dysfunction    Past Surgical History  Procedure Laterality Date  . Coronary stent placement    . Nephrectomy transplanted organ    . Pr vein bypass graft,aorto-fem-pop  1980    fem pop   . Skin cancer excision    . Endarterectomy  08/25/2011    Procedure: ENDARTERECTOMY CAROTID;  Surgeon: Angelia Mould, MD;  Location: [facility] OR;  Service: Vascular;  Laterality: Right;  . Carotid  endarterectomy  08/25/11    RIGHT  cea  . Thyroidectomy     Family History  Problem Relation Age of Onset  . Diabetes Mother   . Hyperlipidemia Mother   . Hypertension Mother   . Heart disease Mother     before age 93  . Hyperlipidemia Father   . Hypertension Father   . Heart disease Father   . Hyperlipidemia Sister   . Hypertension Sister   . Diabetes Brother   . Hypertension Brother   . Hyperlipidemia Brother    History  Substance Use Topics  . Smoking status: Never Smoker   . Smokeless tobacco: Never Used  . Alcohol Use: No    Review of Systems  Constitutional: Negative.   Respiratory: Negative.   Cardiovascular: Negative.     Allergies  Fentanyl and Propofol  Home Medications   Current Outpatient Rx  Name  Route  Sig  Dispense  Refill  . amLODipine (NORVASC) 10 MG tablet   Oral   Take 1 tablet (10 mg total) by mouth daily.   30 tablet   6   . aspirin 81 MG chewable tablet   Oral   Chew 81 mg by mouth daily.           Marland Kitchen  atenolol (TENORMIN) 50 MG tablet   Oral   Take 50 mg by mouth daily.         Marland Kitchen atorvastatin (LIPITOR) 80 MG tablet      TAKE 1 TABLET BY MOUTH EVERY DAY   30 tablet   0   . clopidogrel (PLAVIX) 75 MG tablet   Oral   Take 1 tablet (75 mg total) by mouth daily.   30 tablet   6   . insulin glargine (LANTUS) 100 UNIT/ML injection   Subcutaneous   Inject 24 Units into the skin at bedtime.          . insulin lispro (HUMALOG) 100 UNIT/ML injection   Subcutaneous   Inject 6 Units into the skin 3 (three) times daily as needed (for sugars over 280).          Marland Kitchen levothyroxine (SYNTHROID, LEVOTHROID) 150 MCG tablet   Oral   Take 150 mcg by mouth daily.         . methocarbamol (ROBAXIN) 500 MG tablet   Oral   Take 1 tablet (500 mg total) by mouth 2 (two) times daily.   20 tablet   1   . mycophenolate (MYFORTIC) 180 MG EC tablet   Oral   Take 720 mg by mouth 2 (two) times daily.          . pantoprazole (PROTONIX)  40 MG tablet   Oral   Take 40 mg by mouth daily.         . sirolimus (RAPAMUNE) 1 MG tablet   Oral   Take 4 mg by mouth daily.           BP 146/76  Pulse 88  Temp(Src) 98.1 F (36.7 C) (Oral)  Resp 18  Ht 5\' 11"  (1.803 m)  Wt 190 lb (86.183 kg)  BMI 26.51 kg/m2  SpO2 100% Physical Exam  Nursing note and vitals reviewed. Constitutional: He is oriented to person, place, and time. He appears well-developed and well-nourished.  HENT:  Head: Normocephalic and atraumatic.  Cardiovascular: Normal rate and regular rhythm.   Pulmonary/Chest: Effort normal and breath sounds normal.  Abdominal: Soft. Bowel sounds are normal. There is no tenderness.  Musculoskeletal: Normal range of motion. He exhibits no tenderness.  Neurological: He is alert and oriented to person, place, and time.  Skin: Skin is warm and dry.  Psychiatric: He has a normal mood and affect.    ED Course  Procedures (including critical care time) Labs Review Labs Reviewed  BASIC METABOLIC PANEL - Abnormal; Notable for the following:    Sodium 132 (*)    Chloride 94 (*)    Glucose, Bld 177 (*)    Creatinine, Ser 1.68 (*)    GFR calc non Af Amer 42 (*)    GFR calc Af Amer 49 (*)    All other components within normal limits   Imaging Review No results found.  EKG Interpretation   None       MDM   1. Back pain    Pt is feeling better at this time:will send home on percocet:pt instructed on stopping the vicodin:pt kidney function similar to previous labs    Glendell Docker, NP 03/11/13 1337

## 2013-03-11 NOTE — ED Provider Notes (Signed)
Medical screening examination/treatment/procedure(s) were performed by non-physician practitioner and as supervising physician I was immediately available for consultation/collaboration.  EKG Interpretation   None         Blanchie Dessert, MD 03/11/13 [phone]

## 2013-03-16 ENCOUNTER — Other Ambulatory Visit: Payer: Self-pay | Admitting: Urology

## 2013-03-16 DIAGNOSIS — M545 Low back pain, unspecified: Secondary | ICD-10-CM

## 2013-03-16 DIAGNOSIS — M899 Disorder of bone, unspecified: Secondary | ICD-10-CM

## 2013-03-20 ENCOUNTER — Inpatient Hospital Stay (HOSPITAL_COMMUNITY)
Admission: EM | Admit: 2013-03-20 | Discharge: 2013-04-05 | DRG: 542 | Disposition: A | Discharge status: Home Health | Payer: Medicare Other | Attending: Internal Medicine | Admitting: Internal Medicine

## 2013-03-20 ENCOUNTER — Emergency Department (HOSPITAL_COMMUNITY): Payer: Medicare Other

## 2013-03-20 ENCOUNTER — Inpatient Hospital Stay (HOSPITAL_COMMUNITY): Payer: Medicare Other

## 2013-03-20 ENCOUNTER — Encounter (HOSPITAL_COMMUNITY): Payer: Self-pay | Admitting: Emergency Medicine

## 2013-03-20 DIAGNOSIS — R918 Other nonspecific abnormal finding of lung field: Secondary | ICD-10-CM

## 2013-03-20 DIAGNOSIS — R509 Fever, unspecified: Secondary | ICD-10-CM

## 2013-03-20 DIAGNOSIS — Z85850 Personal history of malignant neoplasm of thyroid: Secondary | ICD-10-CM

## 2013-03-20 DIAGNOSIS — I5032 Chronic diastolic (congestive) heart failure: Secondary | ICD-10-CM | POA: Diagnosis present

## 2013-03-20 DIAGNOSIS — Z923 Personal history of irradiation: Secondary | ICD-10-CM

## 2013-03-20 DIAGNOSIS — I4729 Other ventricular tachycardia: Secondary | ICD-10-CM | POA: Diagnosis not present

## 2013-03-20 DIAGNOSIS — N184 Chronic kidney disease, stage 4 (severe): Secondary | ICD-10-CM

## 2013-03-20 DIAGNOSIS — Z7902 Long term (current) use of antithrombotics/antiplatelets: Secondary | ICD-10-CM

## 2013-03-20 DIAGNOSIS — R0489 Hemorrhage from other sites in respiratory passages: Secondary | ICD-10-CM

## 2013-03-20 DIAGNOSIS — C61 Malignant neoplasm of prostate: Secondary | ICD-10-CM | POA: Diagnosis present

## 2013-03-20 DIAGNOSIS — I471 Supraventricular tachycardia, unspecified: Secondary | ICD-10-CM | POA: Diagnosis not present

## 2013-03-20 DIAGNOSIS — M549 Dorsalgia, unspecified: Secondary | ICD-10-CM

## 2013-03-20 DIAGNOSIS — E785 Hyperlipidemia, unspecified: Secondary | ICD-10-CM | POA: Diagnosis present

## 2013-03-20 DIAGNOSIS — Z8546 Personal history of malignant neoplasm of prostate: Secondary | ICD-10-CM

## 2013-03-20 DIAGNOSIS — Z8249 Family history of ischemic heart disease and other diseases of the circulatory system: Secondary | ICD-10-CM

## 2013-03-20 DIAGNOSIS — R000 Tachycardia, unspecified: Secondary | ICD-10-CM

## 2013-03-20 DIAGNOSIS — Z940 Kidney transplant status: Secondary | ICD-10-CM

## 2013-03-20 DIAGNOSIS — N186 End stage renal disease: Secondary | ICD-10-CM | POA: Diagnosis present

## 2013-03-20 DIAGNOSIS — I472 Ventricular tachycardia, unspecified: Secondary | ICD-10-CM | POA: Diagnosis not present

## 2013-03-20 DIAGNOSIS — F411 Generalized anxiety disorder: Secondary | ICD-10-CM | POA: Diagnosis present

## 2013-03-20 DIAGNOSIS — K648 Other hemorrhoids: Secondary | ICD-10-CM | POA: Diagnosis present

## 2013-03-20 DIAGNOSIS — T463X5A Adverse effect of coronary vasodilators, initial encounter: Secondary | ICD-10-CM | POA: Diagnosis present

## 2013-03-20 DIAGNOSIS — E43 Unspecified severe protein-calorie malnutrition: Secondary | ICD-10-CM | POA: Diagnosis present

## 2013-03-20 DIAGNOSIS — N170 Acute kidney failure with tubular necrosis: Secondary | ICD-10-CM | POA: Diagnosis not present

## 2013-03-20 DIAGNOSIS — I120 Hypertensive chronic kidney disease with stage 5 chronic kidney disease or end stage renal disease: Secondary | ICD-10-CM | POA: Diagnosis present

## 2013-03-20 DIAGNOSIS — I509 Heart failure, unspecified: Secondary | ICD-10-CM

## 2013-03-20 DIAGNOSIS — K439 Ventral hernia without obstruction or gangrene: Secondary | ICD-10-CM | POA: Diagnosis present

## 2013-03-20 DIAGNOSIS — I5041 Acute combined systolic (congestive) and diastolic (congestive) heart failure: Secondary | ICD-10-CM | POA: Diagnosis present

## 2013-03-20 DIAGNOSIS — C7951 Secondary malignant neoplasm of bone: Principal | ICD-10-CM | POA: Diagnosis present

## 2013-03-20 DIAGNOSIS — Z9861 Coronary angioplasty status: Secondary | ICD-10-CM

## 2013-03-20 DIAGNOSIS — I2582 Chronic total occlusion of coronary artery: Secondary | ICD-10-CM | POA: Diagnosis present

## 2013-03-20 DIAGNOSIS — I359 Nonrheumatic aortic valve disorder, unspecified: Secondary | ICD-10-CM | POA: Diagnosis present

## 2013-03-20 DIAGNOSIS — I2510 Atherosclerotic heart disease of native coronary artery without angina pectoris: Secondary | ICD-10-CM | POA: Diagnosis present

## 2013-03-20 DIAGNOSIS — E871 Hypo-osmolality and hyponatremia: Secondary | ICD-10-CM | POA: Diagnosis present

## 2013-03-20 DIAGNOSIS — E1165 Type 2 diabetes mellitus with hyperglycemia: Secondary | ICD-10-CM

## 2013-03-20 DIAGNOSIS — C7952 Secondary malignant neoplasm of bone marrow: Principal | ICD-10-CM

## 2013-03-20 DIAGNOSIS — D509 Iron deficiency anemia, unspecified: Secondary | ICD-10-CM

## 2013-03-20 DIAGNOSIS — Z7982 Long term (current) use of aspirin: Secondary | ICD-10-CM

## 2013-03-20 DIAGNOSIS — Z8673 Personal history of transient ischemic attack (TIA), and cerebral infarction without residual deficits: Secondary | ICD-10-CM

## 2013-03-20 DIAGNOSIS — E872 Acidosis, unspecified: Secondary | ICD-10-CM | POA: Diagnosis present

## 2013-03-20 DIAGNOSIS — R195 Other fecal abnormalities: Secondary | ICD-10-CM

## 2013-03-20 DIAGNOSIS — Z79899 Other long term (current) drug therapy: Secondary | ICD-10-CM

## 2013-03-20 DIAGNOSIS — K219 Gastro-esophageal reflux disease without esophagitis: Secondary | ICD-10-CM | POA: Diagnosis present

## 2013-03-20 DIAGNOSIS — R079 Chest pain, unspecified: Secondary | ICD-10-CM

## 2013-03-20 DIAGNOSIS — I498 Other specified cardiac arrhythmias: Secondary | ICD-10-CM | POA: Diagnosis present

## 2013-03-20 DIAGNOSIS — I739 Peripheral vascular disease, unspecified: Secondary | ICD-10-CM | POA: Diagnosis present

## 2013-03-20 DIAGNOSIS — Z794 Long term (current) use of insulin: Secondary | ICD-10-CM

## 2013-03-20 DIAGNOSIS — J189 Pneumonia, unspecified organism: Secondary | ICD-10-CM | POA: Diagnosis not present

## 2013-03-20 DIAGNOSIS — I4891 Unspecified atrial fibrillation: Secondary | ICD-10-CM | POA: Diagnosis present

## 2013-03-20 DIAGNOSIS — I252 Old myocardial infarction: Secondary | ICD-10-CM

## 2013-03-20 DIAGNOSIS — I4719 Other supraventricular tachycardia: Secondary | ICD-10-CM | POA: Diagnosis present

## 2013-03-20 DIAGNOSIS — D649 Anemia, unspecified: Secondary | ICD-10-CM | POA: Diagnosis present

## 2013-03-20 DIAGNOSIS — E039 Hypothyroidism, unspecified: Secondary | ICD-10-CM | POA: Diagnosis present

## 2013-03-20 DIAGNOSIS — IMO0001 Reserved for inherently not codable concepts without codable children: Secondary | ICD-10-CM | POA: Diagnosis present

## 2013-03-20 DIAGNOSIS — E875 Hyperkalemia: Secondary | ICD-10-CM | POA: Diagnosis present

## 2013-03-20 DIAGNOSIS — I6529 Occlusion and stenosis of unspecified carotid artery: Secondary | ICD-10-CM | POA: Diagnosis present

## 2013-03-20 DIAGNOSIS — I200 Unstable angina: Secondary | ICD-10-CM | POA: Diagnosis present

## 2013-03-20 DIAGNOSIS — E213 Hyperparathyroidism, unspecified: Secondary | ICD-10-CM | POA: Diagnosis present

## 2013-03-20 DIAGNOSIS — I10 Essential (primary) hypertension: Secondary | ICD-10-CM | POA: Diagnosis present

## 2013-03-20 HISTORY — DX: Other specified health status: Z78.9

## 2013-03-20 HISTORY — DX: Type 2 diabetes mellitus without complications: E11.9

## 2013-03-20 HISTORY — DX: Malignant neoplasm of prostate: C61

## 2013-03-20 LAB — GLUCOSE, CAPILLARY
Glucose-Capillary: 211 mg/dL — ABNORMAL HIGH (ref 70–99)
Glucose-Capillary: 143 mg/dL — ABNORMAL HIGH (ref 70–99)
Glucose-Capillary: 148 mg/dL — ABNORMAL HIGH (ref 70–99)

## 2013-03-20 LAB — TROPONIN I: Troponin I: <0.3 ng/mL (ref <0.30)

## 2013-03-20 LAB — PROTIME-INR
INR: 1.14 (ref 0.00–1.49)
Prothrombin Time: 14.4 seconds (ref 11.6–15.2)

## 2013-03-20 LAB — HEMOGLOBIN A1C
Hgb A1c MFr Bld: 7.8 % — ABNORMAL HIGH (ref <5.7)
MEAN PLASMA GLUCOSE: 177 mg/dL — AB (ref <117)

## 2013-03-20 LAB — PSA: PSA: 18.67 ng/mL — AB (ref <=4.00)

## 2013-03-20 LAB — CBC
HCT: 28.9 % — ABNORMAL LOW (ref 39.0–52.0)
Hemoglobin: 8.6 g/dL — ABNORMAL LOW (ref 13.0–17.0)
MCH: 22 pg — AB (ref 26.0–34.0)
MCHC: 29.8 g/dL — AB (ref 30.0–36.0)
MCV: 73.9 fL — AB (ref 78.0–100.0)
PLATELETS: 370 10*3/uL (ref 150–400)
RBC: 3.91 MIL/uL — ABNORMAL LOW (ref 4.22–5.81)
RDW: 17.4 % — AB (ref 11.5–15.5)
WBC: 6.3 10*3/uL (ref 4.0–10.5)

## 2013-03-20 LAB — D-DIMER, QUANTITATIVE: D-Dimer, Quant: 2.01 ug/mL-FEU — ABNORMAL HIGH (ref 0.00–0.48)

## 2013-03-20 LAB — HEPATIC FUNCTION PANEL
ALT: 11 U/L (ref 0–53)
AST: 12 U/L (ref 0–37)
Albumin: 2.8 g/dL — ABNORMAL LOW (ref 3.5–5.2)
Alkaline Phosphatase: 252 U/L — ABNORMAL HIGH (ref 39–117)
Total Bilirubin: 0.3 mg/dL (ref 0.3–1.2)
Total Protein: 8.4 g/dL — ABNORMAL HIGH (ref 6.0–8.3)

## 2013-03-20 LAB — CG4 I-STAT (LACTIC ACID): LACTIC ACID, VENOUS: 1.59 mmol/L (ref 0.5–2.2)

## 2013-03-20 LAB — BASIC METABOLIC PANEL
BUN: 24 mg/dL — ABNORMAL HIGH (ref 6–23)
CALCIUM: 9.2 mg/dL (ref 8.4–10.5)
CO2: 20 meq/L (ref 19–32)
CREATININE: 1.76 mg/dL — AB (ref 0.50–1.35)
Chloride: 93 mEq/L — ABNORMAL LOW (ref 96–112)
GFR, EST AFRICAN AMERICAN: 46 mL/min — AB (ref >90)
GFR, EST NON AFRICAN AMERICAN: 40 mL/min — AB (ref >90)
Glucose, Bld: 221 mg/dL — ABNORMAL HIGH (ref 70–99)
Potassium: 4.6 mEq/L (ref 3.7–5.3)
Sodium: 133 mEq/L — ABNORMAL LOW (ref 137–147)

## 2013-03-20 LAB — PRO B NATRIURETIC PEPTIDE: PRO B NATRI PEPTIDE: 4252 pg/mL — AB (ref 0–125)

## 2013-03-20 LAB — PLATELET INHIBITION P2Y12: Platelet Function  P2Y12: 365 [PRU] (ref 194–418)

## 2013-03-20 LAB — TSH: TSH: 15.258 u[IU]/mL — ABNORMAL HIGH (ref 0.350–4.500)

## 2013-03-20 LAB — T4, FREE: Free T4: 0.93 ng/dL (ref 0.80–1.80)

## 2013-03-20 LAB — POCT I-STAT TROPONIN I: Troponin i, poc: 0.03 ng/mL (ref 0.00–0.08)

## 2013-03-20 MED ORDER — MYCOPHENOLATE SODIUM 180 MG PO TBEC
720.0000 mg | DELAYED_RELEASE_TABLET | Freq: Two times a day (BID) | ORAL | Status: DC
  Administered 2013-03-20 – 2013-04-05 (×33): 720 mg via ORAL
  Filled 2013-03-20 (×38): qty 4

## 2013-03-20 MED ORDER — SODIUM CHLORIDE 0.9 % IV SOLN: 250.0000 mL | INTRAVENOUS | Status: DC | PRN

## 2013-03-20 MED ORDER — ATENOLOL 50 MG PO TABS
50.0000 mg | ORAL_TABLET | Freq: Every day | ORAL | Status: DC
  Administered 2013-03-20 – 2013-03-27 (×8): 50 mg via ORAL
  Filled 2013-03-20 (×5): qty 1
  Filled 2013-03-20: qty 2
  Filled 2013-03-20 (×2): qty 1

## 2013-03-20 MED ORDER — TECHNETIUM TO 99M ALBUMIN AGGREGATED
6.0000 | Freq: Once | INTRAVENOUS | Status: AC | PRN
  Administered 2013-03-20: 6 via INTRAVENOUS

## 2013-03-20 MED ORDER — SODIUM CHLORIDE 0.9 % IJ SOLN
3.0000 mL | INTRAMUSCULAR | Status: DC | PRN
  Administered 2013-03-24: 3 mL via INTRAVENOUS
  Filled 2013-03-20: qty 3

## 2013-03-20 MED ORDER — TECHNETIUM TC 99M DIETHYLENETRIAME-PENTAACETIC ACID: 40.0000 | Freq: Once | INTRAVENOUS | Status: AC | PRN

## 2013-03-20 MED ORDER — OXYCODONE-ACETAMINOPHEN 5-325 MG PO TABS
2.0000 | ORAL_TABLET | Freq: Once | ORAL | Status: AC
Start: 2013-03-20 — End: 2013-03-20
  Administered 2013-03-20: 2 via ORAL
  Filled 2013-03-20: qty 2

## 2013-03-20 MED ORDER — SODIUM CHLORIDE 0.9 % IV BOLUS (SEPSIS)
500.0000 mL | Freq: Once | INTRAVENOUS | Status: AC
  Administered 2013-03-20: 500 mL via INTRAVENOUS

## 2013-03-20 MED ORDER — INSULIN GLARGINE 100 UNIT/ML ~~LOC~~ SOLN
12.0000 [IU] | Freq: Every day | SUBCUTANEOUS | Status: DC
  Administered 2013-03-20: 12 [IU] via SUBCUTANEOUS
  Filled 2013-03-20 (×2): qty 0.12

## 2013-03-20 MED ORDER — SODIUM CHLORIDE 0.9 % IJ SOLN
3.0000 mL | Freq: Two times a day (BID) | INTRAMUSCULAR | Status: DC
  Administered 2013-03-20 – 2013-03-24 (×4): 3 mL via INTRAVENOUS

## 2013-03-20 MED ORDER — HEPARIN SODIUM (PORCINE) 5000 UNIT/ML IJ SOLN
5000.0000 [IU] | Freq: Three times a day (TID) | INTRAMUSCULAR | Status: DC
  Administered 2013-03-20 – 2013-03-28 (×20): 5000 [IU] via SUBCUTANEOUS
  Filled 2013-03-20 (×28): qty 1

## 2013-03-20 MED ORDER — METHOCARBAMOL 500 MG PO TABS
500.0000 mg | ORAL_TABLET | Freq: Two times a day (BID) | ORAL | Status: DC
  Administered 2013-03-20 – 2013-04-05 (×33): 500 mg via ORAL
  Filled 2013-03-20 (×37): qty 1

## 2013-03-20 MED ORDER — NITROGLYCERIN 0.4 MG SL SUBL
SUBLINGUAL_TABLET | SUBLINGUAL | Status: AC
  Filled 2013-03-20: qty 25

## 2013-03-20 MED ORDER — NITROGLYCERIN 0.4 MG SL SUBL
0.4000 mg | SUBLINGUAL_TABLET | SUBLINGUAL | Status: AC | PRN
  Administered 2013-03-20 – 2013-03-27 (×4): 0.4 mg via SUBLINGUAL

## 2013-03-20 MED ORDER — ATORVASTATIN CALCIUM 80 MG PO TABS
80.0000 mg | ORAL_TABLET | Freq: Every day | ORAL | Status: DC
  Administered 2013-03-20 – 2013-04-04 (×16): 80 mg via ORAL
  Filled 2013-03-20 (×22): qty 1

## 2013-03-20 MED ORDER — CLOPIDOGREL BISULFATE 75 MG PO TABS
75.0000 mg | ORAL_TABLET | Freq: Every day | ORAL | Status: DC
  Administered 2013-03-20: 75 mg via ORAL
  Filled 2013-03-20: qty 1

## 2013-03-20 MED ORDER — INSULIN ASPART 100 UNIT/ML ~~LOC~~ SOLN
0.0000 [IU] | SUBCUTANEOUS | Status: DC
  Administered 2013-03-20: 1 [IU] via SUBCUTANEOUS
  Administered 2013-03-20: 3 [IU] via SUBCUTANEOUS
  Administered 2013-03-21: 2 [IU] via SUBCUTANEOUS
  Administered 2013-03-21: 3 [IU] via SUBCUTANEOUS
  Filled 2013-03-20: qty 1

## 2013-03-20 MED ORDER — AMLODIPINE BESYLATE 10 MG PO TABS
10.0000 mg | ORAL_TABLET | Freq: Every day | ORAL | Status: DC
  Administered 2013-03-20 – 2013-03-22 (×3): 10 mg via ORAL
  Filled 2013-03-20 (×3): qty 1

## 2013-03-20 MED ORDER — ASPIRIN 325 MG PO TABS
325.0000 mg | ORAL_TABLET | Freq: Once | ORAL | Status: AC
  Administered 2013-03-20: 325 mg via ORAL
  Filled 2013-03-20: qty 1

## 2013-03-20 MED ORDER — MORPHINE SULFATE 4 MG/ML IJ SOLN: 4.0000 mg | Freq: Once | INTRAMUSCULAR | Status: DC

## 2013-03-20 MED ORDER — ASPIRIN 81 MG PO CHEW
81.0000 mg | CHEWABLE_TABLET | Freq: Every day | ORAL | Status: DC
  Administered 2013-03-20 – 2013-04-05 (×16): 81 mg via ORAL
  Filled 2013-03-20 (×16): qty 1

## 2013-03-20 MED ORDER — MORPHINE SULFATE 4 MG/ML IJ SOLN
4.0000 mg | Freq: Once | INTRAMUSCULAR | Status: AC
  Administered 2013-03-20: 4 mg via INTRAVENOUS
  Filled 2013-03-20: qty 1

## 2013-03-20 MED ORDER — MORPHINE SULFATE 2 MG/ML IJ SOLN
2.0000 mg | INTRAMUSCULAR | Status: DC | PRN
  Administered 2013-03-20: 2 mg via INTRAVENOUS
  Filled 2013-03-20: qty 1

## 2013-03-20 MED ORDER — SIROLIMUS 1 MG PO TABS
4.0000 mg | ORAL_TABLET | Freq: Every day | ORAL | Status: DC
  Administered 2013-03-20 – 2013-03-27 (×8): 4 mg via ORAL
  Filled 2013-03-20 (×9): qty 4

## 2013-03-20 MED ORDER — OXYCODONE-ACETAMINOPHEN 5-325 MG PO TABS
1.0000 | ORAL_TABLET | ORAL | Status: DC | PRN
  Administered 2013-03-20 – 2013-03-26 (×23): 1 via ORAL
  Filled 2013-03-20 (×23): qty 1

## 2013-03-20 MED ORDER — CLOPIDOGREL BISULFATE 75 MG PO TABS
75.0000 mg | ORAL_TABLET | Freq: Every day | ORAL | Status: DC
  Administered 2013-03-21 – 2013-03-27 (×7): 75 mg via ORAL
  Filled 2013-03-20 (×7): qty 1

## 2013-03-20 MED ORDER — OXYCODONE HCL 5 MG PO TABS
5.0000 mg | ORAL_TABLET | ORAL | Status: DC | PRN
  Administered 2013-03-20 – 2013-03-26 (×24): 5 mg via ORAL
  Filled 2013-03-20 (×24): qty 1

## 2013-03-20 MED ORDER — OXYCODONE-ACETAMINOPHEN 10-325 MG PO TABS: 1.0000 | ORAL_TABLET | ORAL | Status: DC | PRN

## 2013-03-20 MED ORDER — SODIUM CHLORIDE 0.9 % IV SOLN
INTRAVENOUS | Status: DC
  Administered 2013-03-20 – 2013-03-21 (×2): via INTRAVENOUS

## 2013-03-20 MED ORDER — LEVOTHYROXINE SODIUM 150 MCG PO TABS
150.0000 ug | ORAL_TABLET | Freq: Every day | ORAL | Status: DC
  Administered 2013-03-20 – 2013-04-05 (×16): 150 ug via ORAL
  Filled 2013-03-20 (×19): qty 1

## 2013-03-20 MED ORDER — PANTOPRAZOLE SODIUM 40 MG PO TBEC
40.0000 mg | DELAYED_RELEASE_TABLET | Freq: Every day | ORAL | Status: DC
  Administered 2013-03-20 – 2013-04-05 (×17): 40 mg via ORAL
  Filled 2013-03-20 (×16): qty 1

## 2013-03-20 MED ORDER — INSULIN GLARGINE 100 UNIT/ML ~~LOC~~ SOLN
24.0000 [IU] | Freq: Every day | SUBCUTANEOUS | Status: DC
  Administered 2013-03-21 – 2013-03-28 (×8): 24 [IU] via SUBCUTANEOUS
  Filled 2013-03-20 (×8): qty 0.24

## 2013-03-20 NOTE — ED Notes (Signed)
Patient transported to nuclear med.

## 2013-03-20 NOTE — ED Notes (Signed)
Pt. reports mid chest pain and low back pain for several weeks worse these past several days with SOB and generalized weakness , denies nausea or diaphoresis .

## 2013-03-20 NOTE — ED Notes (Signed)
Patient transported to X-ray 

## 2013-03-20 NOTE — H&P (Signed)
Hospital Admission Note Date: 03/20/2013  Patient name: Joshua [name] Medical record number: [id number] Date of birth: 02/18/1951 Age: 63 y.o. Gender: male PCP: Sueanne Margarita, MD  Attending physician: Dr. Daryll Drown   Internal Medicine Teaching Service Contact Information  Weekday Hours (7AM-5PM):  1st contact: Dr. Mechele Claude              Pgr: [phone]  2nd contact: Dr. Jerene Pitch [phone]   ** If no return call within 15 minutes (after trying both pagers listed above), please call after hours pagers.   After 5 pm or weekends: 1st Contact: Pager: [phone] 2nd Contact: Pager: [phone]   Chief Complaint: chest pain History of Present Illness: Chest pain  Patient is 63 yo man with PMH of HTN, DM-II ( A1c 8.0 on 08/25/11), CAD (s/p of MI and stent placement in LAD), hx of papillary thyroid cancer, kidney transplantation, carotid artery stenosis (S./P. right endoarterectomy on 08/25/11), PVD (S./P. Fempop in 1980), prostate cancer (s/p of radiation therapy per patient), who presents with chest pain and back pain.   Patient reports that his chest pain started 3 days ago while he was sitting in the chair. The chest pain is located in the substernal area, 8 to 10 out of 10 in severity, throbbing and sharp and non radiating. It is aggravated by deep breath or coughing. The chest pain is intermittent. It happens every few hours. It lasts for a few minutes each time. Patient does not have cough, SOB, fever or chills. No history of recent traveling. No tenderness over the calf areas. His oxygen saturation is 97% on room air in ED. Patient is intermittently tachycardic to the HR of 140s in ED. His HR was 89/min when I evaluated patient in ED.  Cardiology was consulted by Ed. Dr. Johnsie Cancel ordred Lexiscan Myoview.   Patient also reports having moderate back pain. It is located in the lower back at both midline and paraspinal areas on both sides. No urinary incontinence or lose control of bowel moments.  No weakness or numbness in the lower extremities.   ROS:  Denies fever, chills, headaches,  cough, SOB,  abdominal pain, diarrhea, constipation, dysuria, urgency, frequency, hematuria, joint pain or leg swelling.  Meds: Current Outpatient Rx  Name  Route  Sig  Dispense  Refill  . amLODipine (NORVASC) 10 MG tablet   Oral   Take 1 tablet (10 mg total) by mouth daily.   30 tablet   6   . aspirin 81 MG chewable tablet   Oral   Chew 81 mg by mouth daily.           Marland Kitchen atenolol (TENORMIN) 50 MG tablet   Oral   Take 50 mg by mouth daily.         Marland Kitchen atorvastatin (LIPITOR) 80 MG tablet      TAKE 1 TABLET BY MOUTH EVERY DAY   30 tablet   0   . clopidogrel (PLAVIX) 75 MG tablet   Oral   Take 1 tablet (75 mg total) by mouth daily.   30 tablet   6   . insulin glargine (LANTUS) 100 UNIT/ML injection   Subcutaneous   Inject 24 Units into the skin at bedtime.          . insulin lispro (HUMALOG) 100 UNIT/ML injection   Subcutaneous   Inject 6 Units into the skin 3 (three) times daily as needed (for sugars over 280).          Marland Kitchen levothyroxine (  SYNTHROID, LEVOTHROID) 150 MCG tablet   Oral   Take 150 mcg by mouth daily.         . methocarbamol (ROBAXIN) 500 MG tablet   Oral   Take 1 tablet (500 mg total) by mouth 2 (two) times daily.   20 tablet   1   . mycophenolate (MYFORTIC) 180 MG EC tablet   Oral   Take 720 mg by mouth 2 (two) times daily.          Marland Kitchen oxyCODONE-acetaminophen (PERCOCET) 10-325 MG per tablet   Oral   Take 1 tablet by mouth every 4 (four) hours as needed for pain.   30 tablet   0   . pantoprazole (PROTONIX) 40 MG tablet   Oral   Take 40 mg by mouth daily.         . sirolimus (RAPAMUNE) 1 MG tablet   Oral   Take 4 mg by mouth daily.            Allergies: Allergies as of 03/20/2013 - Review Complete 03/20/2013  Allergen Reaction Noted  . Fentanyl  08/11/2011  . Propofol  08/11/2011   Past Medical History  Diagnosis Date  .  Diabetes mellitus   . Carotid artery occlusion   . ASCVD (arteriosclerotic cardiovascular disease)   . CHF (congestive heart failure)   . Cancer     prostate  . Myocardial infarct     about 2 yrs ago  . Anginal pain     last chest pain was a couple of months ago-just lasted less than a minute  . Stroke     "light stroke" about time of MI  . Renal failure     s/p renal transplant  . Aortic stenosis     moderate AS by 06/2011 echo (Dr. Fransico Him)  . Coronary artery disease 03/2009    2 vessel s/p BMS to LAD in setting of NSTEMI, RCA chronically occluded  . PVD (peripheral vascular disease)     s/p fempop 1980's  . Hypothyroidism     s/p papillary thyroid CA excision  . Aortic stenosis, mild   . Hypertension   . Hyperlipidemia   . Aortic stenosis     moderate by echo 06/2012  . Diastolic dysfunction    Past Surgical History  Procedure Laterality Date  . Coronary stent placement    . Nephrectomy transplanted organ    . Pr vein bypass graft,aorto-fem-pop  1980    fem pop   . Skin cancer excision    . Endarterectomy  08/25/2011    Procedure: ENDARTERECTOMY CAROTID;  Surgeon: Angelia Mould, MD;  Location: [facility] OR;  Service: Vascular;  Laterality: Right;  . Carotid endarterectomy  08/25/11    RIGHT  cea  . Thyroidectomy     Family History  Problem Relation Age of Onset  . Diabetes Mother   . Hyperlipidemia Mother   . Hypertension Mother   . Heart disease Mother     before age 39  . Hyperlipidemia Father   . Hypertension Father   . Heart disease Father   . Hyperlipidemia Sister   . Hypertension Sister   . Diabetes Brother   . Hypertension Brother   . Hyperlipidemia Brother    History   Social History  . Marital Status: Married    Spouse Name: N/A    Number of Children: N/A  . Years of Education: N/A   Occupational History  . Not on file.   Social History  Main Topics  . Smoking status: Never Smoker   . Smokeless tobacco: Never Used  . Alcohol Use: No   . Drug Use: No  . Sexual Activity: Not on file   Other Topics Concern  . Not on file   Social History Narrative  . No narrative on file    Review of Systems: Full 14-point review of systems otherwise negative except as noted above in HPI.  Physical Exam:   Filed Vitals:   03/20/13 0900 03/20/13 0915 03/20/13 0930 03/20/13 0945  BP: 148/67 151/77 156/79 149/70  Pulse: 146 80 85 86  Temp:      TempSrc:      Resp: 15 18 15 19   SpO2: 99% 99% 98% 97%    General: Not in acute distress.  HEENT: PERRL, EOMI, no scleral icterus, No JVD. There is bruit over the left carotid artery.  Cardiac: S1/S2, RRR, there is systolic murmur over the aortic valve area. No gallops or rubs Pulm: Good air movement bilaterally. Clear to auscultation bilaterally. No rales, wheezing, rhonchi or rubs. Chest wall: there is tenderness over the front chest wall. Abd: Soft, nondistended, nontender, no rebound pain, no organomegaly, BS present Ext: No edema. 2+DP/PT pulse bilaterally Musculoskeletal: there is tenderness over the midline of lower back and also over the paraspinal muscle areas on both sides. Leg raising test is negative Skin: No rashes.  Neuro: Alert and oriented X3, cranial nerves II-XII grossly intact, muscle strength 5/5 in all extremeties, sensation to light touch intact. Brachial reflex 2+ bilaterally. Knee reflex 1+ bilaterally. Negative Babinski's sign.  Psych: Patient is not psychotic, no suicidal or hemocidal ideation.  Lab results: Basic Metabolic Panel:  Recent Labs  03/20/13 0822  NA 133*  K 4.6  CL 93*  CO2 20  GLUCOSE 221*  BUN 24*  CREATININE 1.76*  CALCIUM 9.2   Liver Function Tests:  Recent Labs  03/20/13 0822  AST 12  ALT 11  ALKPHOS 252*  BILITOT 0.3  PROT 8.4*  ALBUMIN 2.8*   No results found for this basename: LIPASE, AMYLASE,  in the last 72 hours No results found for this basename: AMMONIA,  in the last 72 hours CBC:  Recent Labs  03/20/13 0822   WBC 6.3  HGB 8.6*  HCT 28.9*  MCV 73.9*  PLT 370   Cardiac Enzymes: No results found for this basename: CKTOTAL, CKMB, CKMBINDEX, TROPONINI,  in the last 72 hours BNP:  Recent Labs  03/20/13 0822  PROBNP 4252.0*   D-Dimer: No results found for this basename: DDIMER,  in the last 72 hours CBG: No results found for this basename: GLUCAP,  in the last 72 hours Hemoglobin A1C: No results found for this basename: HGBA1C,  in the last 72 hours Fasting Lipid Panel: No results found for this basename: CHOL, HDL, LDLCALC, TRIG, CHOLHDL, LDLDIRECT,  in the last 72 hours Thyroid Function Tests: No results found for this basename: TSH, T4TOTAL, FREET4, T3FREE, THYROIDAB,  in the last 72 hours Anemia Panel: No results found for this basename: VITAMINB12, FOLATE, FERRITIN, TIBC, IRON, RETICCTPCT,  in the last 72 hours Coagulation: No results found for this basename: LABPROT, INR,  in the last 72 hours Urine Drug Screen: Drugs of Abuse  No results found for this basename: labopia,  cocainscrnur,  labbenz,  amphetmu,  thcu,  labbarb    Alcohol Level: No results found for this basename: ETH,  in the last 72 hours Urinalysis: No results found for this basename: COLORURINE,  APPERANCEUR, LABSPEC, PHURINE, GLUCOSEU, HGBUR, BILIRUBINUR, KETONESUR, PROTEINUR, UROBILINOGEN, NITRITE, LEUKOCYTESUR,  in the last 72 hours Misc. Labs:  Imaging results:  Dg Chest 2 View  03/20/2013   CLINICAL DATA:  Chest and back pain.  Hypertension.  EXAM: CHEST  2 VIEW  COMPARISON:  08/19/2011  FINDINGS: The lungs are clear without focal infiltrate, edema, pneumothorax or pleural effusion. Cardiopericardial silhouette is at upper limits of normal for size. Imaged bony structures of the thorax are intact. Surgical clips are seen in the thyroid bed and left axilla.  IMPRESSION: Stable.  No acute cardiopulmonary process.   Electronically Signed   By: Misty Stanley M.D.   On: 03/20/2013 07:45    Other results:  EKG:  looks like atrial tachycardia? regular, normal axis, LVH, normal R wave progression, normal QT interval,TWI in V5 to V6 which existed in previous EKG on 6/121/13. There are some nonspecific T wave changes in lead I and aVL.   Assessment & Plan by Problem:  Patient is 63 yo man with PMH of HTN, DM-II ( A1c 8.0 on 08/25/11), hx of papillary thyroid cancer, kidney transplantation, carotid artery stenosis (S./P. right endoarterectomy on 08/25/11), PVD (S./P. Fempop in 1980), prostate cancer (s/p of radiation therapy), who presents with chest pain and back pain. D-dimer is elevated.    # Chest pain: Patient present with atypical chest pain. He had cardiac cath done 2011 with BMS to LAD and chronically occluded RCA. Given his significant hx of CAD, it is important to rule out ACS.  another possibility is pulmonary embolism. His chest pain is pleauretic in nature. His has hx of prostatae cancer. His revised Geneva score is 7 if prostate cancer is considered as active per pt, indicating intermittent probability for PE. Other DD include, but less likely, aortic dissection (no typical tearing chest pain, chest x-ray has no mediastinal widening), esophageal perforation (no recent history of endoscopy), pneumonia (no leukocytosis, fever, and a negative chest x-ray), GERD (on Protonix at home) and anxiety. Cardiology was consulted by ED. Dr. Johnsie Cancel evaluated the patient and recommended Lexiscan myovue in am and to check P2Y, TSH, Free T4.   Plan  - will admit to Tele bed.  - Appreciate cardiology's consultation in management our patient.  - Lexiscan myovue per card - cycle CE q6 x3 and repeat EKG in the am  - check p2y12 - Nitroglycerin, Morphine, aspirin, lipitor, BB, Plavvix - TSH, Free T4, A1C  - V/Q scan to r/o PE  #: Kidney transplantation: Followed by Dr. Florene Glen. Currently he is on sirolimus 4 mg dialy and Myfortic EC 720 bid at home. Baseline creatinine is between 1.7-1.8. His creatinine is 1.76 on  admission. -continue home meds -Follow up BMP for monitoring renal function. -gentle IVF: NS 75 cc/h. He may need cath if myovue abnormal per Dr. Johnsie Cancel. Will be hydrated carefully given his hx of diastolic dysfunction (2-D echo showed EF 50-55% with grade 1 diastolic dysfunction on [id number]).   #: Prostate cancer: s/p of radiation therapy at about 4 to 5 year ago per patient. He has been followed up by Dr. Vivien Presto. Last seen was 2 or 3 weeks ago. Bone scan done on 03/03/13 showed no evidence of bony metastatic disease or focal bony abnormality.  -will check PSA  #: HTN: bp is 149/70 on admission.  -will continue home meds: Amlodipine 10 mg daily and atenolol 50 mg daily,  #: Carotid artery stenosis: S./P. right endoarterectomy on 08/25/11. Has bruit over the left carotid artery. -  will continue home meds: ASA, Lipitor, plavix and bp medications.  #: DM-II: A1c 8.0 on 08/25/11. On Lantus 24 U daily at home -will decrease lantus to 24 U daily while he is on NPO -SSI  #: Back pain: it has been going on for more than 2 months. No alarming symptoms now. Leg raising test is negative. Recent bone scan done on 03/03/13 showed no evidence of bony metastatic disease from his prostate cancer or focal bony abnormality. Dr. Vivien Presto obviously ordered a MRI-spine on 03/16/12, which has not been done yet. -Pain control: percocet  #:  Hypothyroidism: s/p of papillary thyroid cancer extension. On Synthroid 150 mcg daily at home. -will continue home medications -Check TSH and free T4.    #  F/E/N  -NS: IV 75 cc/hr - electrolytes: f/u BMP -NPO now  # DVT px: Heparin sq   Dispo: Disposition is deferred at this time, awaiting improvement of current medical problems. Anticipated discharge in approximately 2 to 3 day(s).   The patient does not have a current PCP (TURNER,TRACI R, MD), therefore is not requiring OPC follow-up after discharge.   The patient does not have transportation limitations that  hinder transportation to clinic appointments.  Signed:  Ivor Costa, MD PGY3, Internal Medicine Teaching Service Pager: [phone]  03/20/2013, 10:33 AM

## 2013-03-20 NOTE — Consult Note (Signed)
CARDIOLOGY CONSULT NOTE       Patient ID: Joshua [name] MRN: [id number] DOB/AGE: 04/23/1950 63 y.o.  Admit date: 03/20/2013 Referring Physician:  Daryll Drown Primary Physician:  Primary Cardiologist:  Fransico Him  Reason for Consultation:  Chest Pain   Principal Problem:   Chest pain Active Problems:   Occlusion and stenosis of carotid artery without mention of cerebral infarction   Coronary artery disease   Hypertension   Dyslipidemia, goal LDL below 70   CHF (congestive heart failure)   HPI:   Joshua [name] is a 63 y.o. male with a history of ASCAD, HTN ,mild AS and dyslipidemia.  Seen by Dr Radford Pax in November  At that time had some chest pain that she thought was atypical.  CAD ? Last cath 2011 with BMS to LAD and chronically occluded RCA.  S/P renal transplant at [facility] 11 years ago  Mild CRF sees [facility].  PVD with RCEA by Dr Scot Dock in June and PVD with previous fem pop.  Has had SSCP and back pain on and off for 3 days.  Pain intermittent but recurrent  Not exertional.  Not much relief in ER with nitro Some relief with MS04  Getting percocet now.  No associated dyspnea, pleurisy, trauma.  Initial POC troponin negative and no acute ECG changes.      ROS All other systems reviewed and negative except as noted above  Past Medical History  Diagnosis Date  . Diabetes mellitus   . Carotid artery occlusion   . ASCVD (arteriosclerotic cardiovascular disease)   . CHF (congestive heart failure)   . Cancer     prostate  . Myocardial infarct     about 2 yrs ago  . Anginal pain     last chest pain was a couple of months ago-just lasted less than a minute  . Stroke     "light stroke" about time of MI  . Renal failure     s/p renal transplant  . Aortic stenosis     moderate AS by 06/2011 echo (Dr. Fransico Him)  . Coronary artery disease 03/2009    2 vessel s/p BMS to LAD in setting of NSTEMI, RCA chronically occluded  . PVD (peripheral vascular disease)     s/p fempop  1980's  . Hypothyroidism     s/p papillary thyroid CA excision  . Aortic stenosis, mild   . Hypertension   . Hyperlipidemia   . Aortic stenosis     moderate by echo 06/2012  . Diastolic dysfunction     Family History  Problem Relation Age of Onset  . Diabetes Mother   . Hyperlipidemia Mother   . Hypertension Mother   . Heart disease Mother     before age 94  . Hyperlipidemia Father   . Hypertension Father   . Heart disease Father   . Hyperlipidemia Sister   . Hypertension Sister   . Diabetes Brother   . Hypertension Brother   . Hyperlipidemia Brother     History   Social History  . Marital Status: Married    Spouse Name: N/A    Number of Children: N/A  . Years of Education: N/A   Occupational History  . Not on file.   Social History Main Topics  . Smoking status: Never Smoker   . Smokeless tobacco: Never Used  . Alcohol Use: No  . Drug Use: No  . Sexual Activity: Not on file   Other Topics Concern  . Not on file  Social History Narrative  . No narrative on file    Past Surgical History  Procedure Laterality Date  . Coronary stent placement    . Nephrectomy transplanted organ    . Pr vein bypass graft,aorto-fem-pop  1980    fem pop   . Skin cancer excision    . Endarterectomy  08/25/2011    Procedure: ENDARTERECTOMY CAROTID;  Surgeon: Angelia Mould, MD;  Location: [facility] OR;  Service: Vascular;  Laterality: Right;  . Carotid endarterectomy  08/25/11    RIGHT  cea  . Thyroidectomy       . nitroGLYCERIN          Physical Exam: Blood pressure 149/70, pulse 86, temperature 97.9 F (36.6 C), temperature source Oral, resp. rate 19, SpO2 97.00%.     ROS: Denies fever, malais, weight loss, blurry vision, decreased visual acuity, cough, sputum, SOB, hemoptysis, pleuritic pain, palpitaitons, heartburn, abdominal pain, melena, lower extremity edema, claudication, or rash.  All other systems reviewed and negative  General: Affect  appropriate Chronically ill black male  HEENT: normal Neck supple with no adenopathy  S/P Right CEA  JVP normal no bruits no thyromegaly Lungs crackles right base  Normal diaph motion Heart:  S1/S2 SEM  murmur, no rub, gallop or click PMI normal Abdomen: benighn, BS positve, no tenderness, no AAA no bruit.  No HSM or HJR S/P fem pop and thrill in persistant LUE fistula  No edema Neuro non-focal Skin warm and dry No muscular weakness    Labs:   Lab Results  Component Value Date   WBC 6.3 03/20/2013   HGB 8.6* 03/20/2013   HCT 28.9* 03/20/2013   MCV 73.9* 03/20/2013   PLT 370 03/20/2013    Recent Labs Lab 03/20/13 0822  NA 133*  K 4.6  CL 93*  CO2 20  BUN 24*  CREATININE 1.76*  CALCIUM 9.2  PROT 8.4*  BILITOT 0.3  ALKPHOS 252*  ALT 11  AST 12  GLUCOSE 221*   Lab Results  Component Value Date   CKTOTAL 188 03/24/2009   CKMB 10.0 CRITICAL VALUE NOTED.  VALUE IS CONSISTENT WITH PREVIOUSLY REPORTED AND CALLED VALUE.* 03/24/2009   TROPONINI  Value: 1.15        POSSIBLE MYOCARDIAL ISCHEMIA. SERIAL TESTING RECOMMENDED. CRITICAL VALUE NOTED.  VALUE IS CONSISTENT WITH PREVIOUSLY REPORTED AND CALLED VALUE.* 03/24/2009    Lab Results  Component Value Date   CHOL 122 01/12/2013   CHOL  Value: 220        ATP III CLASSIFICATION:  <200     mg/dL   Desirable  200-239  mg/dL   Borderline High  >=240    mg/dL   High       * 03/24/2009   Lab Results  Component Value Date   HDL 37.00* 01/12/2013   HDL 44 03/24/2009   Lab Results  Component Value Date   LDLCALC 60 01/12/2013   LDLCALC  Value: 138        Total Cholesterol/HDL:CHD Risk Coronary Heart Disease Risk Table                     Men   Women  1/2 Average Risk   3.4   3.3  Average Risk       5.0   4.4  2 X Average Risk   9.6   7.1  3 X Average Risk  23.4   11.0        Use the calculated  Patient Ratio above and the CHD Risk Table to determine the patient's CHD Risk.        ATP III CLASSIFICATION (LDL):  <100     mg/dL   Optimal   100-129  mg/dL   Near or Above                    Optimal  130-159  mg/dL   Borderline  160-189  mg/dL   High  >190     mg/dL   Very High* 03/24/2009   Lab Results  Component Value Date   TRIG 126.0 01/12/2013   TRIG 191* 03/24/2009   Lab Results  Component Value Date   CHOLHDL 3 01/12/2013   CHOLHDL 5.0 03/24/2009   No results found for this basename: LDLDIRECT      Radiology: Dg Chest 2 View  03/20/2013   CLINICAL DATA:  Chest and back pain.  Hypertension.  EXAM: CHEST  2 VIEW  COMPARISON:  08/19/2011  FINDINGS: The lungs are clear without focal infiltrate, edema, pneumothorax or pleural effusion. Cardiopericardial silhouette is at upper limits of normal for size. Imaged bony structures of the thorax are intact. Surgical clips are seen in the thyroid bed and left axilla.  IMPRESSION: Stable.  No acute cardiopulmonary process.   Electronically Signed   By: Misty Stanley M.D.   On: 03/20/2013 07:45   Nm Bone Scan Whole Body  03/03/2013   CLINICAL DATA:  Pain.  Prostate cancer.  EXAM: NUCLEAR MEDICINE WHOLE BODY BONE SCAN  TECHNIQUE: Whole body anterior and posterior images were obtained approximately 3 hours after intravenous injection of radiopharmaceutical.  COMPARISON:  Bone scan 10/26/2007.  CT 02/28/2013.  RADIOPHARMACEUTICALS:  26.0 mCi Technetium-99 MDP  FINDINGS: Bilateral renal atrophy with a transplanted kidney right pelvis. No focal bony abnormality. No evidence of bony metastatic disease.  IMPRESSION: No evidence of bony metastatic disease or focal bony abnormality. Bilateral renal atrophy. Transplanted kidney right pelvis.   Electronically Signed   By: Marcello Moores  Register   On: 03/03/2013 14:02    EKG:  ST vs ? Atrial tach rate 140  LVH with strain  Telemetry  SR rate 88 now    Current facility-administered medications:nitroGLYCERIN (NITROSTAT) 0.4 MG SL tablet, , , , ;  nitroGLYCERIN (NITROSTAT) SL tablet 0.4 mg, 0.4 mg, Sublingual, Q5 min PRN, Osvaldo Shipper, MD, 0.4 mg at  03/20/13 [id number] Current outpatient prescriptions:amLODipine (NORVASC) 10 MG tablet, Take 1 tablet (10 mg total) by mouth daily., Disp: 30 tablet, Rfl: 6;  aspirin 81 MG chewable tablet, Chew 81 mg by mouth daily.  , Disp: , Rfl: ;  atenolol (TENORMIN) 50 MG tablet, Take 50 mg by mouth daily., Disp: , Rfl: ;  atorvastatin (LIPITOR) 80 MG tablet, TAKE 1 TABLET BY MOUTH EVERY DAY, Disp: 30 tablet, Rfl: 0 clopidogrel (PLAVIX) 75 MG tablet, Take 1 tablet (75 mg total) by mouth daily., Disp: 30 tablet, Rfl: 6;  insulin glargine (LANTUS) 100 UNIT/ML injection, Inject 24 Units into the skin at bedtime. , Disp: , Rfl: ;  insulin lispro (HUMALOG) 100 UNIT/ML injection, Inject 6 Units into the skin 3 (three) times daily as needed (for sugars over 280). , Disp: , Rfl:  levothyroxine (SYNTHROID, LEVOTHROID) 150 MCG tablet, Take 150 mcg by mouth daily., Disp: , Rfl: ;  methocarbamol (ROBAXIN) 500 MG tablet, Take 1 tablet (500 mg total) by mouth 2 (two) times daily., Disp: 20 tablet, Rfl: 1;  mycophenolate (MYFORTIC) 180 MG EC tablet, Take 720  mg by mouth 2 (two) times daily. , Disp: , Rfl:  oxyCODONE-acetaminophen (PERCOCET) 10-325 MG per tablet, Take 1 tablet by mouth every 4 (four) hours as needed for pain., Disp: 30 tablet, Rfl: 0;  pantoprazole (PROTONIX) 40 MG tablet, Take 40 mg by mouth daily., Disp: , Rfl: ;  sirolimus (RAPAMUNE) 1 MG tablet, Take 4 mg by mouth daily. , Disp: , Rfl:   ASSESSMENT AND PLAN:  Chest Pain:  Known CAD but pain atypical  In light of previous renal transplant and elevated Cr would like to avoid dye exposure and cath.  Lexiscan myovue in am   He is on plavix and chronic protonix  Will check P2Y   Arrhythmia:  Not clear if ECG in ER is sinus tach or not  Continue atenolol  And telemetry  F/U TSH and T4 as he is on replacement  CRF:  Will hydrate as he may need cath if myovue abnormal  F/U Dr Florene Glen Chol:  Continue statin   Signed: Jenkins Rouge 03/20/2013, 10:36 AM

## 2013-03-20 NOTE — ED Notes (Signed)
MD at bedside. 

## 2013-03-20 NOTE — ED Notes (Signed)
Lactic acid results shown to Dr. Walden 

## 2013-03-20 NOTE — Progress Notes (Signed)
The patient is having a V/Q scan today for elevated Ddimer.  He will need an extra day prior to the [place].  Joshua [name] 1:17 PM.

## 2013-03-20 NOTE — ED Notes (Signed)
Pt will go to Nuclear Med and transfer to floor from there.

## 2013-03-20 NOTE — ED Provider Notes (Signed)
CSN: [id number]     Arrival date & time 03/20/13  0700 History   First MD Initiated Contact with Patient 03/20/13 [phone]     Chief Complaint  Patient presents with  . Chest Pain   (Consider location/radiation/quality/duration/timing/severity/associated sxs/prior Treatment) Patient is a 63 y.o. male presenting with chest pain. The history is provided by the patient.  Chest Pain Pain location:  Substernal area Pain quality: aching   Pain radiates to:  Does not radiate Pain radiates to the back: no   Pain severity:  Moderate Onset quality:  Gradual Duration:  3 days Timing:  Intermittent Progression:  Worsening Chronicity:  Recurrent Context: at rest   Relieved by:  Nothing Worsened by:  Nothing tried Associated symptoms: abdominal pain and back pain (since diagnosed with metastatic disease)   Associated symptoms: no cough and no fever     Past Medical History  Diagnosis Date  . Diabetes mellitus   . Carotid artery occlusion   . ASCVD (arteriosclerotic cardiovascular disease)   . CHF (congestive heart failure)   . Cancer     prostate  . Myocardial infarct     about 2 yrs ago  . Anginal pain     last chest pain was a couple of months ago-just lasted less than a minute  . Stroke     "light stroke" about time of MI  . Renal failure     s/p renal transplant  . Aortic stenosis     moderate AS by 06/2011 echo (Dr. Fransico Him)  . Coronary artery disease 03/2009    2 vessel s/p BMS to LAD in setting of NSTEMI, RCA chronically occluded  . PVD (peripheral vascular disease)     s/p fempop 1980's  . Hypothyroidism     s/p papillary thyroid CA excision  . Aortic stenosis, mild   . Hypertension   . Hyperlipidemia   . Aortic stenosis     moderate by echo 06/2012  . Diastolic dysfunction    Past Surgical History  Procedure Laterality Date  . Coronary stent placement    . Nephrectomy transplanted organ    . Pr vein bypass graft,aorto-fem-pop  1980    fem pop   . Skin cancer  excision    . Endarterectomy  08/25/2011    Procedure: ENDARTERECTOMY CAROTID;  Surgeon: Angelia Mould, MD;  Location: [facility] OR;  Service: Vascular;  Laterality: Right;  . Carotid endarterectomy  08/25/11    RIGHT  cea  . Thyroidectomy     Family History  Problem Relation Age of Onset  . Diabetes Mother   . Hyperlipidemia Mother   . Hypertension Mother   . Heart disease Mother     before age 74  . Hyperlipidemia Father   . Hypertension Father   . Heart disease Father   . Hyperlipidemia Sister   . Hypertension Sister   . Diabetes Brother   . Hypertension Brother   . Hyperlipidemia Brother    History  Substance Use Topics  . Smoking status: Never Smoker   . Smokeless tobacco: Never Used  . Alcohol Use: No    Review of Systems  Constitutional: Negative for fever.  Respiratory: Negative for cough.   Cardiovascular: Positive for chest pain.  Gastrointestinal: Positive for abdominal pain.  Musculoskeletal: Positive for back pain (since diagnosed with metastatic disease).  All other systems reviewed and are negative.    Allergies  Fentanyl and Propofol  Home Medications   Current Outpatient Rx  Name  Route  Sig  Dispense  Refill  . amLODipine (NORVASC) 10 MG tablet   Oral   Take 1 tablet (10 mg total) by mouth daily.   30 tablet   6   . aspirin 81 MG chewable tablet   Oral   Chew 81 mg by mouth daily.           Marland Kitchen atenolol (TENORMIN) 50 MG tablet   Oral   Take 50 mg by mouth daily.         Marland Kitchen atorvastatin (LIPITOR) 80 MG tablet      TAKE 1 TABLET BY MOUTH EVERY DAY   30 tablet   0   . clopidogrel (PLAVIX) 75 MG tablet   Oral   Take 1 tablet (75 mg total) by mouth daily.   30 tablet   6   . HYDROcodone-acetaminophen (NORCO/VICODIN) 5-325 MG per tablet   Oral   Take 1 tablet by mouth every 4 (four) hours as needed for moderate pain.         Marland Kitchen insulin glargine (LANTUS) 100 UNIT/ML injection   Subcutaneous   Inject 24 Units into the skin at  bedtime.          . insulin lispro (HUMALOG) 100 UNIT/ML injection   Subcutaneous   Inject 6 Units into the skin 3 (three) times daily as needed (for sugars over 280).          Marland Kitchen levothyroxine (SYNTHROID, LEVOTHROID) 150 MCG tablet   Oral   Take 150 mcg by mouth daily.         . methocarbamol (ROBAXIN) 500 MG tablet   Oral   Take 1 tablet (500 mg total) by mouth 2 (two) times daily.   20 tablet   1   . mycophenolate (MYFORTIC) 180 MG EC tablet   Oral   Take 720 mg by mouth 2 (two) times daily.          Marland Kitchen oxyCODONE-acetaminophen (PERCOCET) 10-325 MG per tablet   Oral   Take 1 tablet by mouth every 4 (four) hours as needed for pain.   30 tablet   0   . pantoprazole (PROTONIX) 40 MG tablet   Oral   Take 40 mg by mouth daily.         . sirolimus (RAPAMUNE) 1 MG tablet   Oral   Take 4 mg by mouth daily.           There were no vitals taken for this visit. Physical Exam  Nursing note and vitals reviewed. Constitutional: He is oriented to person, place, and time. He appears well-developed and well-nourished. No distress.  HENT:  Head: Normocephalic and atraumatic.  Mouth/Throat: No oropharyngeal exudate.  Eyes: EOM are normal. Pupils are equal, round, and reactive to light.  Neck: Normal range of motion. Neck supple.  Cardiovascular: Regular rhythm.  Tachycardia present.  Exam reveals no friction rub.   No murmur heard. Pulmonary/Chest: Effort normal and breath sounds normal. No respiratory distress. He has no wheezes. He has no rales.  Abdominal: He exhibits no distension. There is tenderness. There is no rebound (diffuse, pain over graft site).  Musculoskeletal: Normal range of motion. He exhibits no edema.  Neurological: He is alert and oriented to person, place, and time.  Skin: No rash noted. He is not diaphoretic.    ED Course  Procedures (including critical care time) Labs Review Labs Reviewed  CBC  BASIC METABOLIC PANEL  PRO B NATRIURETIC  PEPTIDE   Imaging Review No  results found.  EKG Interpretation    Date/Time:  Monday March 20 2013 07:04:56 EST Ventricular Rate:  113 PR Interval:  156 QRS Duration: 92 QT Interval:  324 QTC Calculation: 444 R Axis:   5 Text Interpretation:  Sinus tachycardia with Premature atrial complexes Left ventricular hypertrophy with repolarization abnormality LVH abnormality seen on prior, similar to prior exam Confirmed by [facility]  MD, [place] ([id number]) on 03/20/2013 7:47:36 AM            Date: 03/20/2013  Rate: 113  Rhythm: sinus tachycardia  QRS Axis: normal  Intervals: normal  ST/T Wave abnormalities: LVH changes in anterior leads, flipped Ts laterally, all similar to prior  Conduction Disutrbances:none  Narrative Interpretation:   Old EKG Reviewed: unchanged  Repeat when tachycardic  Date: 03/20/2013  Rate: 141  Rhythm: sinus tachycardia  QRS Axis: normal  Intervals: normal  ST/T Wave abnormalities: ST elevations anteriorly and c/w prior LVH prior  Conduction Disutrbances:none  Narrative Interpretation:   Old EKG Reviewed: unchanged   MDM   1. Tachycardia   2. Chest pain   3. CHF (congestive heart failure)    21M with hx of kidney transplant, coronary artery disease, hypertension presents with chest pain and back pain. 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 He states chest pain and off for the past 2 days. Chest pain is similar to prior coronary artery disease. Patient is an extremely bad historian. No alleviating or exacerbating factors. No cough, no shortness of breath. Here patient is intermittently tachycardic to the 140s. At rest, he is in the 110s, moving her he is in the 140s. It is sinus on monitor. He has normal oxygen saturation. His normal blood pressure. His diffuse abdominal pain on exam. His EKG shows sinus tachycardia with rates in the 140s and LVH changes c/w prior. Will do broad based workup. Labs without  gross abnormality. Renal function at baseline. HR improved to the 80s. Admitted to medicine service. Cards consulted and will see patient.    Osvaldo Shipper, MD 03/21/13 [phone]

## 2013-03-21 DIAGNOSIS — Z940 Kidney transplant status: Secondary | ICD-10-CM

## 2013-03-21 DIAGNOSIS — C7952 Secondary malignant neoplasm of bone marrow: Principal | ICD-10-CM

## 2013-03-21 DIAGNOSIS — I359 Nonrheumatic aortic valve disorder, unspecified: Secondary | ICD-10-CM

## 2013-03-21 DIAGNOSIS — C7951 Secondary malignant neoplasm of bone: Principal | ICD-10-CM

## 2013-03-21 DIAGNOSIS — C61 Malignant neoplasm of prostate: Secondary | ICD-10-CM

## 2013-03-21 DIAGNOSIS — R509 Fever, unspecified: Secondary | ICD-10-CM

## 2013-03-21 DIAGNOSIS — E039 Hypothyroidism, unspecified: Secondary | ICD-10-CM

## 2013-03-21 DIAGNOSIS — E119 Type 2 diabetes mellitus without complications: Secondary | ICD-10-CM

## 2013-03-21 LAB — GLUCOSE, CAPILLARY
GLUCOSE-CAPILLARY: 161 mg/dL — AB (ref 70–99)
GLUCOSE-CAPILLARY: 168 mg/dL — AB (ref 70–99)
Glucose-Capillary: 229 mg/dL — ABNORMAL HIGH (ref 70–99)
Glucose-Capillary: 93 mg/dL (ref 70–99)
Glucose-Capillary: 194 mg/dL — ABNORMAL HIGH (ref 70–99)

## 2013-03-21 LAB — BASIC METABOLIC PANEL
BUN: 28 mg/dL — AB (ref 6–23)
CO2: 19 meq/L (ref 19–32)
Calcium: 8.9 mg/dL (ref 8.4–10.5)
Chloride: 95 mEq/L — ABNORMAL LOW (ref 96–112)
Creatinine, Ser: 1.85 mg/dL — ABNORMAL HIGH (ref 0.50–1.35)
GFR calc Af Amer: 44 mL/min — ABNORMAL LOW (ref >90)
GFR, EST NON AFRICAN AMERICAN: 38 mL/min — AB (ref >90)
GLUCOSE: 162 mg/dL — AB (ref 70–99)
Potassium: 4.8 mEq/L (ref 3.7–5.3)
SODIUM: 133 meq/L — AB (ref 137–147)

## 2013-03-21 LAB — URINE MICROSCOPIC-ADD ON

## 2013-03-21 LAB — URINALYSIS, ROUTINE W REFLEX MICROSCOPIC
GLUCOSE, UA: NEGATIVE mg/dL
Ketones, ur: 15 mg/dL — AB
LEUKOCYTES UA: NEGATIVE
Nitrite: NEGATIVE
PROTEIN: 100 mg/dL — AB
Specific Gravity, Urine: 1.021 (ref 1.005–1.030)
Urobilinogen, UA: 1 mg/dL (ref 0.0–1.0)
pH: 5.5 (ref 5.0–8.0)

## 2013-03-21 LAB — INFLUENZA PANEL BY PCR (TYPE A & B)
H1N1 flu by pcr: NOT DETECTED
INFLAPCR: NEGATIVE
INFLBPCR: NEGATIVE

## 2013-03-21 LAB — TROPONIN I: Troponin I: <0.3 ng/mL (ref <0.30)

## 2013-03-21 MED ORDER — ENSURE COMPLETE PO LIQD
237.0000 mL | Freq: Three times a day (TID) | ORAL | Status: DC
  Administered 2013-03-21 – 2013-04-03 (×20): 237 mL via ORAL

## 2013-03-21 MED ORDER — INSULIN ASPART 100 UNIT/ML ~~LOC~~ SOLN
0.0000 [IU] | Freq: Three times a day (TID) | SUBCUTANEOUS | Status: DC
  Administered 2013-03-23: 2 [IU] via SUBCUTANEOUS
  Administered 2013-03-23: 1 [IU] via SUBCUTANEOUS
  Administered 2013-03-23: 2 [IU] via SUBCUTANEOUS
  Administered 2013-03-24 (×2): 3 [IU] via SUBCUTANEOUS
  Administered 2013-03-25: 1 [IU] via SUBCUTANEOUS
  Administered 2013-03-26: 5 [IU] via SUBCUTANEOUS
  Administered 2013-03-26 (×2): 2 [IU] via SUBCUTANEOUS
  Administered 2013-03-27 (×2): 5 [IU] via SUBCUTANEOUS
  Administered 2013-03-27 – 2013-03-28 (×2): 3 [IU] via SUBCUTANEOUS
  Administered 2013-03-28: 7 [IU] via SUBCUTANEOUS
  Administered 2013-03-28: 3 [IU] via SUBCUTANEOUS
  Administered 2013-03-29: 2 [IU] via SUBCUTANEOUS
  Administered 2013-03-29: 1 [IU] via SUBCUTANEOUS
  Administered 2013-03-29 – 2013-03-30 (×3): 2 [IU] via SUBCUTANEOUS
  Administered 2013-04-01: 3 [IU] via SUBCUTANEOUS
  Administered 2013-04-02: 13:00:00 2 [IU] via SUBCUTANEOUS
  Administered 2013-04-05: 08:00:00 1 [IU] via SUBCUTANEOUS
  Administered 2013-04-05: 2 [IU] via SUBCUTANEOUS

## 2013-03-21 NOTE — Progress Notes (Signed)
Subjective: Patient had "a few" episodes of chest pain last night, though no CP this morning so far. He mostly complains of continued back pain that has been stable for a few months now. Back pain responds well to oxycodone, which he is taking. I spoke with the radiologist here who reported that patient did in fact have an MRI lumbar spine that revealed metastatic lesions throughout patient's lumbar spine as well as T12 a few weeks ago. Cardiology plans for [facility] tomorrow and would like repeat 2D echo as well.   Objective: Vital signs in last 24 hours: Filed Vitals:   03/20/13 1345 03/20/13 1646 03/20/13 2126 03/21/13 0513  BP: 146/72 140/65 161/69 143/56  Pulse: 80 75 86 80  Temp:  97.3 F (36.3 C) 101.7 F (38.7 C) 100 F (37.8 C)  TempSrc:  Oral Oral Oral  Resp: 21 20 20 18   Weight:    171 lb 14.4 oz (77.973 kg)  SpO2: 100% 98% 97% 96%   Weight change:   Intake/Output Summary (Last 24 hours) at 03/21/13 1440 Last data filed at 03/21/13 0517  Gross per 24 hour  Intake      0 ml  Output    250 ml  Net   -250 ml   Physical Exam General: alert, cooperative, and in no apparent distress; sitting upright in bed HEENT: pupils equal round and reactive to light, vision grossly intact, oropharynx clear and non-erythematous  Neck: supple, +bruit over L carotid artery Lungs: clear to ascultation bilaterally, normal work of respiration, no wheezes, rales, ronchi; sternum and L side of chest TTP Heart: regular rate and rhythm, no murmurs, gallops, or rubs Abdomen: soft, non-tender, non-distended, normal bowel sounds Back: there is tenderness over the midline of lower back and also over the paraspinal muscle areas on both sides Extremities: warm b/l, no BLE edema Neurologic: alert & oriented X3, cranial nerves II-XII grossly intact, strength grossly intact, sensation intact to light touch  Lab Results: Basic Metabolic Panel:  Recent Labs Lab 03/20/13 0822 03/21/13 0338    NA 133* 133*  K 4.6 4.8  CL 93* 95*  CO2 20 19  GLUCOSE 221* 162*  BUN 24* 28*  CREATININE 1.76* 1.85*  CALCIUM 9.2 8.9   Liver Function Tests:  Recent Labs Lab 03/20/13 0822  AST 12  ALT 11  ALKPHOS 252*  BILITOT 0.3  PROT 8.4*  ALBUMIN 2.8*   CBC:  Recent Labs Lab 03/20/13 0822  WBC 6.3  HGB 8.6*  HCT 28.9*  MCV 73.9*  PLT 370   Cardiac Enzymes:  Recent Labs Lab 03/20/13 1228 03/20/13 1950 03/21/13 0347  TROPONINI <0.30 <0.30 <0.30   BNP:  Recent Labs Lab 03/20/13 0822  PROBNP 4252.0*   D-Dimer:  Recent Labs Lab 03/20/13 1118  DDIMER 2.01*   CBG:  Recent Labs Lab 03/20/13 1241 03/20/13 1654 03/20/13 2122 03/21/13 0512 03/21/13 0801 03/21/13 1152  GLUCAP 211* 143* 148* 161* 168* 229*   Hemoglobin A1C:  Recent Labs Lab 03/20/13 1228  HGBA1C 7.8*   Thyroid Function Tests:  Recent Labs Lab 03/20/13 1118  TSH 15.258*  FREET4 0.93   Coagulation:  Recent Labs Lab 03/20/13 1118  LABPROT 14.4  INR 1.14   Micro Results: No results found for this or any previous visit (from the past 240 hour(s)). Studies/Results: Dg Chest 2 View  03/20/2013   CLINICAL DATA:  Chest and back pain.  Hypertension.  EXAM: CHEST  2 VIEW  COMPARISON:  08/19/2011  FINDINGS: The lungs are clear without focal infiltrate, edema, pneumothorax or pleural effusion. Cardiopericardial silhouette is at upper limits of normal for size. Imaged bony structures of the thorax are intact. Surgical clips are seen in the thyroid bed and left axilla.  IMPRESSION: Stable.  No acute cardiopulmonary process.   Electronically Signed   By: Misty Stanley M.D.   On: 03/20/2013 07:45   Nm Pulmonary Perf And Vent  03/20/2013   CLINICAL DATA:  Chest pain  EXAM: NUCLEAR MEDICINE VENTILATION - PERFUSION LUNG SCAN  Views: Anterior, posterior, left lateral, right lateral, RPO, LPO, RAO, LAO -ventilation and perfusion  Radionuclide: Technetium 67m DTPA -ventilation; Technetium 35m  macroaggregated albumin-perfusion  Dose:  40.0 mCi-ventilation; 6.0 mCi-perfusion  Route of administration: Inhalation -ventilation; intravenous -perfusion  COMPARISON:  Chest radiograph March 20, 2013  FINDINGS: Radiotracer uptake on the ventilation study is homogeneous and symmetric bilaterally.  Radiotracer uptake on the perfusion study is homogeneous and symmetric bilaterally.  There is no appreciable ventilation/perfusion mismatch.  IMPRESSION: No ventilation or perfusion abnormalities. Very low probability of pulmonary embolus.   Electronically Signed   By: Lowella Grip M.D.   On: 03/20/2013 16:15   Medications: I have reviewed the patient's current medications. Scheduled Meds: . amLODipine  10 mg Oral Daily  . aspirin  81 mg Oral Daily  . atenolol  50 mg Oral Daily  . atorvastatin  80 mg Oral q1800  . clopidogrel  75 mg Oral Q breakfast  . heparin  5,000 Units Subcutaneous Q8H  . insulin aspart  0-9 Units Subcutaneous Q4H  . insulin glargine  24 Units Subcutaneous Daily  . levothyroxine  150 mcg Oral Daily  . methocarbamol  500 mg Oral BID  . mycophenolate  720 mg Oral BID  . pantoprazole  40 mg Oral Daily  . sirolimus  4 mg Oral Daily  . sodium chloride  3 mL Intravenous Q12H   Continuous Infusions: . sodium chloride 75 mL/hr at 03/20/13 1313   PRN Meds:.sodium chloride, morphine injection, nitroGLYCERIN, oxyCODONE, oxyCODONE-acetaminophen, sodium chloride  Assessment/Plan:  # Chest pain: Patient present with atypical chest pain. Given his significant hx of CAD, we ruled out ACS with three negative troponins. Risk stratification: TSH 15.2, A1c 7.8. PE was essentially ruled out with negative VQ scan. Cardiology was consulted and would like to do lexiscan myoview tomorrow morning. Cardiology would also like to repeat Echo. Pt will be kept NPO after MN tonight. Items on my ddx: prostate cancer metastasis to sternum and or ribs, muscle strain, costochondritis. Patient did spike a  fever overnight. Unclear etiology and may be unrelated to chest pain. Perhaps 2/2 metastatic cancer. CXR was clear on admission. - Lexiscan myovue per card  -2D echo - Nitroglycerin, Morphine, aspirin, lipitor, atenolol, Plavix -monitor on telemetry  # Metastatic Prostate cancer with back pain: Patient does have metastases to L and T spine per MRI done a few weeks ago (despite the fact that patient had a negative bone scan done 03/03/2013). This is most likely the cause for his 2 month hx of back pain. No red flag s/s. PSA 18.67 this admission. He is s/p of radiation therapy at about 4 to 5 year ago per patient and follows with Dr. Diona Fanti ([phone]). I made Dr. Diona Fanti aware that patient is here.  -Pain control: percocet   # Fever: Patient spiked fever to 101.7 overnight. Unclear etiology. May be 2/2 his metastatic cancer. No source of infection identified at this time, CXR negative and UA  wnl. Will continue to follow. -no antibiotics for now -BCx x 2 drawn today -UA today showed 15 ketones, 100 protein, trace Hgb -monitor temperature -if patient spikes fever again overnight, please reculture  # Kidney transplantation: Followed by Dr. Florene Glen. Currently he is on sirolimus 4 mg dialy and Myfortic EC 720 bid at home. Baseline creatinine is between 1.7-1.8. His creatinine is 1.76 on admission, though increased slightly today. -continue home meds  -BMP in AM -gentle IVF: NS 75 cc/h. He may need cath if myovue abnormal per Dr. Johnsie Cancel. Will be hydrated carefully given his hx of diastolic dysfunction (2-D echo showed EF 50-55% with grade 1 diastolic dysfunction on 0/17/79).   # HTN: bp is stable, 143/56 today. -continue home meds: Amlodipine 10 mg daily and atenolol 50 mg daily  #: DM-II: A1c 7.8 on admission.. On Lantus 24 U daily at home. CBGs fairly controlled, 168-229. -will continue lantus to 24U qHS -SSI   #: Hypothyroidism: TSH 15.2, free T4 .93 (wnl). on admission. He is s/p of  papillary thyroid cancer with removal. On Synthroid 150 mcg daily at home. This will likely need to be increased.  -will continue home medications for now -Check TSH and free T4.    # DVT px: Heparin sq  Dispo: Disposition is deferred at this time, awaiting improvement of current medical problems.  Anticipated discharge in approximately 2 day(s).   The patient does have a current PCP (Sueanne Margarita, MD) and does not need an [facility] hospital follow-up appointment after discharge.  The patient does not have transportation limitations that hinder transportation to clinic appointments.  .Services Needed at time of discharge: Y = Yes, Blank = No PT:   OT:   RN:   Equipment:   Other:     LOS: 1 day   Rebecca Eaton, MD 03/21/2013, 2:40 PM

## 2013-03-21 NOTE — Progress Notes (Signed)
  Echocardiogram 2D Echocardiogram has been performed.  Joshua [name] 03/21/2013, 5:07 PM

## 2013-03-21 NOTE — H&P (Signed)
  Date: 03/21/2013  Patient name: Joshua [name]  Medical record number: [id number]  Date of birth: 1951-02-17   I have seen and evaluated Marliss Coots and discussed their care with the Residency Team.  Briefly Mr. Shiflet is a 63yo AA man with PMH of HTN, DM2, CAD with stents, CAD, PVD s/p fem pop, prostate cancer treated with radiation, hx of thyroid cancer, h/o ESRD and kidney transplantation who presents for chest pain and back pain.  The chest pain start 3 days ago at rest, it is left sided, substernal, 8/10 at the start, throbbing and sharp, non radiating, possibly worse with breathing, however, definitely worse with palpation.  The pain happened every few hours.  He was intermittently tachycardic in the ED ranging from the 80s-140.  Cardiology was consulted in the ED.  Patient further reports back pain which has been slowly worsening over past few months.  He has had work up in the outpatient setting with a bone scan and MRI lumbar spine as there was concern for bony metastasis of his prostate cancer.  On exam, he has reproducible chest pain over his lower sternum, point tenderness to the lumbar spine (3-5) and paraspinal muscles, an area of discoloration on the left lower lumbar area which the patient states is tender, RR, NR, systolic murmur heard, clear lungs with good air movement, no rashes.  He did not have + SLR.   On labs, he had a slightly low Na at 133 along with a Cl of 93 and a Cr of 1.73.  ALP is 252.    Assessment and Plan: I have seen and evaluated the patient as outlined above. I agree with the formulated Assessment and Plan as detailed in the residents' admission note, with the following changes:   1. Chest pain, atypical: Though the pain is atypical, there is concern for ACS, Cardiology was consulted and we will rule out for ACS prior to doing a myoview.  There is also concern, given his transplant status, elevated HR intermittently and chest pain for PE.  A VQ scan has been  ordered.  Based on symptoms of concomitant back pain, possibly bony mets from prostate cancer?  His PSA is also elevated.  He reports having a recent bone scan and MRI, results will be collected and we will contact his Urologist for more information. Pain and nausea control.  Continue cardiac meds.  Chest TSH, A1C  2. H/O renal transplant: Agree with hydration given need for possible contrast.  Monitor closely for volume overload.   3. Back pain: He reports studies which have been done, we will attempt to obtain the results.  Pain control as needed with oral medications.   Other issues per resident note.   [name], MD 1/13/20152:32 PM

## 2013-03-21 NOTE — Progress Notes (Signed)
INITIAL NUTRITION ASSESSMENT  DOCUMENTATION CODES Per approved criteria  -Severe malnutrition in the context of chronic illness   INTERVENTION: 1.  Ensure Complete PO tid, each supplement provides an additional 350 kcals and 13 grams protein per 8 fl oz. Bottle.  2.  Recommend diet liberalization to Regular diet to maximize PO choices.   NUTRITION DIAGNOSIS: Inadequate oral intake related to chronic illness as evidenced by weight loss and decreased intake.  Goal: Patient to meet >/= 90% of estimated nutrition needs  Monitor:  Weight, labs, I/Os, diet advancement and PO intake  Reason for Assessment: Identified at risk by the Malnutrition Screening Tool  63 y.o. male  Admitting Dx: Chest pain  ASSESSMENT: Patient with PMH of HTN, DM-II, CAD, hx of paillary thyroid cancer, kidney transplantation, carotid artery stenosis, PVD, and prostate cancer. Patient presents with chest and back pain.   Patient reports a decreased appetite for a month due to increased pain associated with recent prostate cancer diagnosis. Per patient's wife, patient consumes "a couple of bites at each meal." Patient reports that liquids are better tolerated compared to food. Patient reports usual weight at 190 pounds but stated a 21 pound weight loss over the past 2 weeks.   Nutrition Focused Physical Exam:  Subcutaneous Fat:  Orbital Region: mild-moderate depletion Upper Arm Region: mild-moderate depletion Thoracic and Lumbar Region: NA  Muscle:  Temple Region: severe depletion Clavicle Bone Region: severe depletion Clavicle and Acromion Bone Region: severe depletion Scapular Bone Region: NA Dorsal Hand: WNL Patellar Region: mild-moderate depletion Anterior Thigh Region: mild-moderate depletion Posterior Calf Region: mild-moderate depletion  Edema: none   Patient meets criteria for severe malnutrition in the context of chronic illness as evidenced by weight loss of >5% in 1 month and estimated  energy intake of < 75% for greater than 1 month.  Height: Ht Readings from Last 1 Encounters:  03/11/13 5\' 11"  (1.803 m)    Weight: Wt Readings from Last 1 Encounters:  03/21/13 171 lb 14.4 oz (77.973 kg)    Ideal Body Weight: 172 lbs  % Ideal Body Weight: 99%  Wt Readings from Last 10 Encounters:  03/21/13 171 lb 14.4 oz (77.973 kg)  03/11/13 190 lb (86.183 kg)  01/11/13 190 lb (86.183 kg)  10/26/12 190 lb (86.183 kg)  03/23/12 197 lb (89.359 kg)  09/16/11 204 lb (92.534 kg)  08/25/11 202 lb 2.6 oz (91.7 kg)  08/25/11 202 lb 2.6 oz (91.7 kg)  08/19/11 199 lb 4.7 oz (90.4 kg)  08/12/11 200 lb (90.719 kg)    Usual Body Weight: 190 lbs  % Usual Body Weight: 90%  BMI:  Body mass index is 23.99 kg/(m^2).  Estimated Nutritional Needs: Kcal: 2400-2600 Protein: 120-140 grams Fluid: 1.2 L  Skin: no wounds  Diet Order: Renal Current order NPO  EDUCATION NEEDS: -No education needs identified at this time   Intake/Output Summary (Last 24 hours) at 03/21/13 1413 Last data filed at 03/21/13 0517  Gross per 24 hour  Intake      0 ml  Output    250 ml  Net   -250 ml    Last BM: PTA   Labs:   Recent Labs Lab 03/20/13 0822 03/21/13 0338  NA 133* 133*  K 4.6 4.8  CL 93* 95*  CO2 20 19  BUN 24* 28*  CREATININE 1.76* 1.85*  CALCIUM 9.2 8.9  GLUCOSE 221* 162*    CBG (last 3)   Recent Labs  03/21/13 0512 03/21/13 0801 03/21/13 1152  GLUCAP 161* 168* 229*    Scheduled Meds: . amLODipine  10 mg Oral Daily  . aspirin  81 mg Oral Daily  . atenolol  50 mg Oral Daily  . atorvastatin  80 mg Oral q1800  . clopidogrel  75 mg Oral Q breakfast  . heparin  5,000 Units Subcutaneous Q8H  . insulin aspart  0-9 Units Subcutaneous Q4H  . insulin glargine  24 Units Subcutaneous Daily  . levothyroxine  150 mcg Oral Daily  . methocarbamol  500 mg Oral BID  . mycophenolate  720 mg Oral BID  . pantoprazole  40 mg Oral Daily  . sirolimus  4 mg Oral Daily  .  sodium chloride  3 mL Intravenous Q12H    Continuous Infusions: . sodium chloride 75 mL/hr at 03/20/13 1313    Past Medical History  Diagnosis Date  . Carotid artery occlusion   . ASCVD (arteriosclerotic cardiovascular disease)   . CHF (congestive heart failure)   . Anginal pain     last chest pain was a couple of months ago-just lasted less than a minute  . Stroke     "light stroke" about time of MI  . Renal failure     s/p renal transplant  . Aortic stenosis     moderate AS by 06/2011 echo (Dr. Fransico Him)  . Coronary artery disease 03/2009    2 vessel s/p BMS to LAD in setting of NSTEMI, RCA chronically occluded  . PVD (peripheral vascular disease)     s/p fempop 1980's  . Hypothyroidism     s/p papillary thyroid CA excision  . Hypertension   . Hyperlipidemia   . Aortic stenosis     moderate by echo 06/2012  . Diastolic dysfunction   . Heart murmur   . Myocardial infarct 03/2009 X2  . Type II diabetes mellitus   . Prostate cancer     "radiation tx ~ 4-5 yr ago; cleared then came back a couple months ago" (03/20/2013)    Past Surgical History  Procedure Laterality Date  . Nephrectomy transplanted organ Bilateral 2001  . Pr vein bypass graft,aorto-fem-pop  1980    fem pop   . Endarterectomy  08/25/2011    Procedure: ENDARTERECTOMY CAROTID;  Surgeon: Angelia Mould, MD;  Location: [place];  Service: Vascular;  Laterality: Right;  . Thyroidectomy    . Coronary angioplasty with stent placement  03/2009    "1"    Claudell Kyle, Dietetic Intern Pager [phone]  I agree with student dietitian note; appropriate revisions have been made.  Molli Barrows, RD, LDN, [place] Pager# [phone] After Hours Pager# [phone]

## 2013-03-21 NOTE — Progress Notes (Addendum)
    Subjective:  Denies dyspnea; still with intermittent chest pain   Objective:  Filed Vitals:   03/20/13 1345 03/20/13 1646 03/20/13 2126 03/21/13 0513  BP: 146/72 140/65 161/69 143/56  Pulse: 80 75 86 80  Temp:  97.3 F (36.3 C) 101.7 F (38.7 C) 100 F (37.8 C)  TempSrc:  Oral Oral Oral  Resp: 21 20 20 18   Weight:    171 lb 14.4 oz (77.973 kg)  SpO2: 100% 98% 97% 96%    Intake/Output from previous day:  Intake/Output Summary (Last 24 hours) at 03/21/13 0911 Last data filed at 03/21/13 0517  Gross per 24 hour  Intake      0 ml  Output    250 ml  Net   -250 ml    Physical Exam: Physical exam: Well-developed well-nourished in no acute distress.  Skin is warm and dry.  HEENT is normal.  Neck is supple.  Chest is clear to auscultation with normal expansion.  Cardiovascular exam is regular rate and rhythm. 3/6 systolic murmur LSB Abdominal exam nontender or distended. No masses palpated. Extremities show no edema. neuro grossly intact    Lab Results: Basic Metabolic Panel:  Recent Labs  03/20/13 0822 03/21/13 0338  NA 133* 133*  K 4.6 4.8  CL 93* 95*  CO2 20 19  GLUCOSE 221* 162*  BUN 24* 28*  CREATININE 1.76* 1.85*  CALCIUM 9.2 8.9   CBC:  Recent Labs  03/20/13 0822  WBC 6.3  HGB 8.6*  HCT 28.9*  MCV 73.9*  PLT 370   Cardiac Enzymes:  Recent Labs  03/20/13 1228 03/20/13 1950 03/21/13 0347  TROPONINI <0.30 <0.30 <0.30     Assessment/Plan:  1 chest pain-symptoms are atypical. He also has renal insufficiency. We would like noninvasive evaluation if possible. Plan nuclear study tomorrow morning for risk stratification. We cannot proceed today because of VQ scan yesterday. 2 chronic stage III kidney disease status post renal transplant-follow renal function. 3 aortic stenosis-plan repeat echocardiogram  4 prostate cancer-per oncology. 5 hypothyroidism-management per primary care. 6 fever-etiology unclear. Needs further evaluation but  will leave to primary care. Chest xray negative on admission.  Kirk Ruths 03/21/2013, 9:11 AM

## 2013-03-22 ENCOUNTER — Inpatient Hospital Stay (HOSPITAL_COMMUNITY): Payer: Medicare Other

## 2013-03-22 DIAGNOSIS — I2510 Atherosclerotic heart disease of native coronary artery without angina pectoris: Secondary | ICD-10-CM

## 2013-03-22 DIAGNOSIS — I509 Heart failure, unspecified: Secondary | ICD-10-CM

## 2013-03-22 DIAGNOSIS — I129 Hypertensive chronic kidney disease with stage 1 through stage 4 chronic kidney disease, or unspecified chronic kidney disease: Secondary | ICD-10-CM

## 2013-03-22 DIAGNOSIS — I10 Essential (primary) hypertension: Secondary | ICD-10-CM

## 2013-03-22 DIAGNOSIS — R079 Chest pain, unspecified: Secondary | ICD-10-CM

## 2013-03-22 DIAGNOSIS — E785 Hyperlipidemia, unspecified: Secondary | ICD-10-CM

## 2013-03-22 DIAGNOSIS — M549 Dorsalgia, unspecified: Secondary | ICD-10-CM

## 2013-03-22 DIAGNOSIS — N189 Chronic kidney disease, unspecified: Secondary | ICD-10-CM

## 2013-03-22 DIAGNOSIS — N179 Acute kidney failure, unspecified: Secondary | ICD-10-CM

## 2013-03-22 DIAGNOSIS — E43 Unspecified severe protein-calorie malnutrition: Secondary | ICD-10-CM | POA: Diagnosis present

## 2013-03-22 LAB — BASIC METABOLIC PANEL
BUN: 30 mg/dL — ABNORMAL HIGH (ref 6–23)
CO2: 18 mEq/L — ABNORMAL LOW (ref 19–32)
CREATININE: 2.01 mg/dL — AB (ref 0.50–1.35)
Calcium: 8 mg/dL — ABNORMAL LOW (ref 8.4–10.5)
Chloride: 94 mEq/L — ABNORMAL LOW (ref 96–112)
GFR calc non Af Amer: 34 mL/min — ABNORMAL LOW (ref >90)
GFR, EST AFRICAN AMERICAN: 39 mL/min — AB (ref >90)
Glucose, Bld: 155 mg/dL — ABNORMAL HIGH (ref 70–99)
Potassium: 4.6 mEq/L (ref 3.7–5.3)
SODIUM: 129 meq/L — AB (ref 137–147)

## 2013-03-22 LAB — RENAL FUNCTION PANEL
Albumin: 2.2 g/dL — ABNORMAL LOW (ref 3.5–5.2)
BUN: 28 mg/dL — ABNORMAL HIGH (ref 6–23)
CHLORIDE: 97 meq/L (ref 96–112)
CO2: 16 meq/L — AB (ref 19–32)
Calcium: 8.7 mg/dL (ref 8.4–10.5)
Creatinine, Ser: 1.96 mg/dL — ABNORMAL HIGH (ref 0.50–1.35)
GFR, EST AFRICAN AMERICAN: 41 mL/min — AB (ref >90)
GFR, EST NON AFRICAN AMERICAN: 35 mL/min — AB (ref >90)
Glucose, Bld: 155 mg/dL — ABNORMAL HIGH (ref 70–99)
POTASSIUM: 4.5 meq/L (ref 3.7–5.3)
Phosphorus: 3.2 mg/dL (ref 2.3–4.6)
SODIUM: 134 meq/L — AB (ref 137–147)

## 2013-03-22 LAB — BLOOD GAS, ARTERIAL
Acid-base deficit: 4.7 mmol/L — ABNORMAL HIGH (ref 0.0–2.0)
BICARBONATE: 19.2 meq/L — AB (ref 20.0–24.0)
Drawn by: 24610
FIO2: 0.21 %
O2 SAT: 94.8 %
PATIENT TEMPERATURE: 98.6
TCO2: 20.1 mmol/L (ref 0–100)
pCO2 arterial: 31.3 mmHg — ABNORMAL LOW (ref 35.0–45.0)
pH, Arterial: 7.404 (ref 7.350–7.450)
pO2, Arterial: 76.3 mmHg — ABNORMAL LOW (ref 80.0–100.0)

## 2013-03-22 LAB — GLUCOSE, CAPILLARY
GLUCOSE-CAPILLARY: 132 mg/dL — AB (ref 70–99)
GLUCOSE-CAPILLARY: 155 mg/dL — AB (ref 70–99)
Glucose-Capillary: 129 mg/dL — ABNORMAL HIGH (ref 70–99)
Glucose-Capillary: 189 mg/dL — ABNORMAL HIGH (ref 70–99)

## 2013-03-22 MED ORDER — DEXTROSE-NACL 5-0.9 % IV SOLN
INTRAVENOUS | Status: DC
  Administered 2013-03-22: 17:00:00 via INTRAVENOUS

## 2013-03-22 MED ORDER — REGADENOSON 0.4 MG/5ML IV SOLN
INTRAVENOUS | Status: AC
  Filled 2013-03-22: qty 5

## 2013-03-22 MED ORDER — REGADENOSON 0.4 MG/5ML IV SOLN
0.4000 mg | Freq: Once | INTRAVENOUS | Status: DC
  Filled 2013-03-22: qty 5

## 2013-03-22 MED ORDER — DILTIAZEM HCL 60 MG PO TABS
60.0000 mg | ORAL_TABLET | Freq: Three times a day (TID) | ORAL | Status: DC
  Administered 2013-03-22 – 2013-03-23 (×2): 60 mg via ORAL
  Filled 2013-03-22 (×5): qty 1

## 2013-03-22 MED ORDER — TECHNETIUM TC 99M SESTAMIBI GENERIC - CARDIOLITE
10.0000 | Freq: Once | INTRAVENOUS | Status: AC | PRN
  Administered 2013-03-22: 10 via INTRAVENOUS

## 2013-03-22 MED ORDER — TECHNETIUM TC 99M SESTAMIBI GENERIC - CARDIOLITE
30.0000 | Freq: Once | INTRAVENOUS | Status: AC | PRN
  Administered 2013-03-22: 30 via INTRAVENOUS

## 2013-03-22 NOTE — Consult Note (Signed)
ELECTROPHYSIOLOGY CONSULT NOTE  Patient ID: Joshua [name], MRN: [id number], DOB/AGE: 1950/05/05 63 y.o. Admit date: 03/20/2013 Date of Consult: 03/22/2013  Primary Physician: Sueanne Margarita, MD Primary Cardiologist: KN   Chief Complaint: tachy   HPI Joshua [name] is a 63 y.o. male   With known CAD admitted with chest pain and multiple episodes of abrupt onset offset tachycardia  These are minimally symptomatic  The duration is not clear  He has renal insufficiecny with CR =2 or so. S/p Transplant Per [name] dis s/p revascularization   Past Medical History  Diagnosis Date  . Carotid artery occlusion   . ASCVD (arteriosclerotic cardiovascular disease)   . CHF (congestive heart failure)   . Anginal pain     last chest pain was a couple of months ago-just lasted less than a minute  . Stroke     "light stroke" about time of MI  . Renal failure     s/p renal transplant  . Aortic stenosis     moderate AS by 06/2011 echo (Dr. Fransico Him)  . Coronary artery disease 03/2009    2 vessel s/p BMS to LAD in setting of NSTEMI, RCA chronically occluded  . PVD (peripheral vascular disease)     s/p fempop 1980's  . Hypothyroidism     s/p papillary thyroid CA excision  . Hypertension   . Hyperlipidemia   . Aortic stenosis     moderate by echo 06/2012  . Diastolic dysfunction   . Heart murmur   . Myocardial infarct 03/2009 X2  . Type II diabetes mellitus   . Prostate cancer     "radiation tx ~ 4-5 yr ago; cleared then came back a couple months ago" (03/20/2013)      Surgical History:  Past Surgical History  Procedure Laterality Date  . Nephrectomy transplanted organ Bilateral 2001  . Pr vein bypass graft,aorto-fem-pop  1980    fem pop   . Endarterectomy  08/25/2011    Procedure: ENDARTERECTOMY CAROTID;  Surgeon: Angelia Mould, MD;  Location: [facility];  Service: Vascular;  Laterality: Right;  . Thyroidectomy    . Coronary angioplasty with stent placement  03/2009    "1"      Home Meds: Prior to Admission medications   Medication Sig Start Date End Date Taking? Authorizing Provider  amLODipine (NORVASC) 10 MG tablet Take 1 tablet (10 mg total) by mouth daily. 12/08/12  Yes Sueanne Margarita, MD  aspirin 81 MG chewable tablet Chew 81 mg by mouth daily.     Yes Historical Provider, MD  atenolol (TENORMIN) 50 MG tablet Take 50 mg by mouth daily.   Yes Historical Provider, MD  atorvastatin (LIPITOR) 80 MG tablet TAKE 1 TABLET BY MOUTH EVERY DAY 03/08/13  Yes Sueanne Margarita, MD  clopidogrel (PLAVIX) 75 MG tablet Take 1 tablet (75 mg total) by mouth daily. 12/08/12  Yes Sueanne Margarita, MD  insulin glargine (LANTUS) 100 UNIT/ML injection Inject 24 Units into the skin at bedtime.    Yes Historical Provider, MD  insulin lispro (HUMALOG) 100 UNIT/ML injection Inject 6 Units into the skin 3 (three) times daily as needed (for sugars over 280).    Yes Historical Provider, MD  levothyroxine (SYNTHROID, LEVOTHROID) 150 MCG tablet Take 150 mcg by mouth daily.   Yes Historical Provider, MD  methocarbamol (ROBAXIN) 500 MG tablet Take 1 tablet (500 mg total) by mouth 2 (two) times daily. 02/09/13  Yes Billy Fischer, MD  mycophenolate (MYFORTIC) 180  MG EC tablet Take 720 mg by mouth 2 (two) times daily.    Yes Historical Provider, MD  oxyCODONE-acetaminophen (PERCOCET) 10-325 MG per tablet Take 1 tablet by mouth every 4 (four) hours as needed for pain. 03/11/13  Yes Glendell Docker, NP  pantoprazole (PROTONIX) 40 MG tablet Take 40 mg by mouth daily.   Yes Historical Provider, MD  sirolimus (RAPAMUNE) 1 MG tablet Take 4 mg by mouth daily.    Yes Historical Provider, MD    Inpatient Medications:  . amLODipine  10 mg Oral Daily  . aspirin  81 mg Oral Daily  . atenolol  50 mg Oral Daily  . atorvastatin  80 mg Oral q1800  . clopidogrel  75 mg Oral Q breakfast  . feeding supplement (ENSURE COMPLETE)  237 mL Oral TID BM  . heparin  5,000 Units Subcutaneous Q8H  . insulin aspart  0-9 Units  Subcutaneous TID WC  . insulin glargine  24 Units Subcutaneous Daily  . levothyroxine  150 mcg Oral Daily  . methocarbamol  500 mg Oral BID  . mycophenolate  720 mg Oral BID  . pantoprazole  40 mg Oral Daily  . regadenoson  0.4 mg Intravenous Once  . sirolimus  4 mg Oral Daily  . sodium chloride  3 mL Intravenous Q12H      Allergies:  Allergies  Allergen Reactions  . Fentanyl     "Almost died"   . Propofol     "Almost died"     History   Social History  . Marital Status: Married    Spouse Name: N/A    Number of Children: N/A  . Years of Education: N/A   Occupational History  . Not on file.   Social History Main Topics  . Smoking status: Never Smoker   . Smokeless tobacco: Never Used  . Alcohol Use: No  . Drug Use: No  . Sexual Activity: Yes   Other Topics Concern  . Not on file   Social History Narrative  . No narrative on file     Family History  Problem Relation Age of Onset  . Diabetes Mother   . Hyperlipidemia Mother   . Hypertension Mother   . Heart disease Mother     before age 87  . Hyperlipidemia Father   . Hypertension Father   . Heart disease Father   . Hyperlipidemia Sister   . Hypertension Sister   . Diabetes Brother   . Hypertension Brother   . Hyperlipidemia Brother      ROS:  Please see the history of present illness.     All other systems reviewed and negative.    Physical Exam:    Blood pressure 156/52, pulse 52, temperature 98.3 F (36.8 C), temperature source Oral, resp. rate 20, weight 171 lb 6.7 oz (77.755 kg), SpO2 98.00%. Well developed and nourished in no acute distress HENT normal Neck supple with JVP-flat Carotids brisk and full without bruits Clear Regular rate and rhythm, no murmurs or gallops Abd-soft with active BS without hepatomegaly No Clubbing cyanosis edema  Fistula left arm Skin-warm and dry A & Oriented  Grossly normal sensory and motor function     Labs: Cardiac Enzymes  Recent Labs   03/20/13 1228 03/20/13 1950 03/21/13 0347  TROPONINI <0.30 <0.30 <0.30   CBC Lab Results  Component Value Date   WBC 6.3 03/20/2013   HGB 8.6* 03/20/2013   HCT 28.9* 03/20/2013   MCV 73.9* 03/20/2013   PLT 370  03/20/2013   PROTIME:  Recent Labs  03/20/13 1118  LABPROT 14.4  INR 1.14   Chemistry  Recent Labs Lab 03/20/13 0822  03/22/13 1357  NA 133*  < > 134*  K 4.6  < > 4.5  CL 93*  < > 97  CO2 20  < > 16*  BUN 24*  < > 28*  CREATININE 1.76*  < > 1.96*  CALCIUM 9.2  < > 8.7  PROT 8.4*  --   --   BILITOT 0.3  --   --   ALKPHOS 252*  --   --   ALT 11  --   --   AST 12  --   --   GLUCOSE 221*  < > 155*  < > = values in this interval not displayed. Lipids Lab Results  Component Value Date   CHOL 122 01/12/2013   HDL 37.00* 01/12/2013   LDLCALC 60 01/12/2013   TRIG 126.0 01/12/2013   BNP Pro B Natriuretic peptide (BNP)  Date/Time Value Range Status  03/20/2013  8:22 AM 4252.0* 0 - 125 pg/mL Final  03/23/2009  9:41 PM 77.0  0.0 - 100.0 pg/mL Final   Miscellaneous Lab Results  Component Value Date   DDIMER 2.01* 03/20/2013    Radiology/Studies:  Dg Chest 2 View  03/20/2013   CLINICAL DATA:  Chest and back pain.  Hypertension.  EXAM: CHEST  2 VIEW  COMPARISON:  08/19/2011  FINDINGS: The lungs are clear without focal infiltrate, edema, pneumothorax or pleural effusion. Cardiopericardial silhouette is at upper limits of normal for size. Imaged bony structures of the thorax are intact. Surgical clips are seen in the thyroid bed and left axilla.  IMPRESSION: Stable.  No acute cardiopulmonary process.   Electronically Signed   By: Misty Stanley M.D.   On: 03/20/2013 07:45   Nm Bone Scan Whole Body  03/03/2013   CLINICAL DATA:  Pain.  Prostate cancer.  EXAM: NUCLEAR MEDICINE WHOLE BODY BONE SCAN  TECHNIQUE: Whole body anterior and posterior images were obtained approximately 3 hours after intravenous injection of radiopharmaceutical.  COMPARISON:  Bone scan 10/26/2007.  CT  02/28/2013.  RADIOPHARMACEUTICALS:  26.0 mCi Technetium-99 MDP  FINDINGS: Bilateral renal atrophy with a transplanted kidney right pelvis. No focal bony abnormality. No evidence of bony metastatic disease.  IMPRESSION: No evidence of bony metastatic disease or focal bony abnormality. Bilateral renal atrophy. Transplanted kidney right pelvis.   Electronically Signed   By: Marcello Moores  Register   On: 03/03/2013 14:02   US Renal  03/22/2013   CLINICAL DATA:  Acute kidney injury. Patient with bilateral renal transplant.  EXAM: RENAL/URINARY TRACT ULTRASOUND COMPLETE  COMPARISON:  CT, 02/28/2013  FINDINGS: Right Kidney:  Length: 7.4 cm. Kidney is echogenic. 15 mm cyst arises from the upper pole. There are probable additional tiny cysts. No hydronephrosis.  Left Kidney:  Native left kidney not visualized.  Transplant kidney: In the right lower quadrant to right upper pelvis, a transplant kidney is visualized. Is normal in overall size measuring 10.7 cm. It has normal parenchymal echogenicity. Small cyst arises from its lower pole measuring 1 cm. No other renal masses. No hydronephrosis.  Bladder:  Appears normal for degree of bladder distention.  IMPRESSION: 1. Transplant kidney is normal in appearance within a small lower pole cyst measuring 1 cm. 2. Atrophic native kidneys. Only the right was visualized. Is echogenic with at least 1 discrete small cysts. No hydronephrosis.   Electronically Signed   By: Shanon Brow  Ormond M.D.   On: 03/22/2013 15:50   Nm Pulmonary Perf And Vent  03/20/2013   CLINICAL DATA:  Chest pain  EXAM: NUCLEAR MEDICINE VENTILATION - PERFUSION LUNG SCAN  Views: Anterior, posterior, left lateral, right lateral, RPO, LPO, RAO, LAO -ventilation and perfusion  Radionuclide: Technetium 74m DTPA -ventilation; Technetium 4m macroaggregated albumin-perfusion  Dose:  40.0 mCi-ventilation; 6.0 mCi-perfusion  Route of administration: Inhalation -ventilation; intravenous -perfusion  COMPARISON:  Chest radiograph  March 20, 2013  FINDINGS: Radiotracer uptake on the ventilation study is homogeneous and symmetric bilaterally.  Radiotracer uptake on the perfusion study is homogeneous and symmetric bilaterally.  There is no appreciable ventilation/perfusion mismatch.  IMPRESSION: No ventilation or perfusion abnormalities. Very low probability of pulmonary embolus.   Electronically Signed   By: Lowella Grip M.D.   On: 03/20/2013 16:15    EKG sinus with freq atrial ectopics Tel  Multiple runs of narrow qrs tach which start with aberrant PACs, Long RP  All consistent with atrail tach although no warm up  Assessment and Plan:  Atrial tach  Chest pain  CAD with prior stent and near normal LV function  I would try to augment rate control.   I contacted Dr Florene Glen thought no contraindicationfor changing amlodipine >>dilt The only antiarrhythmic options are multaq and amiodarone, and given age, think ablation would be a good intermediate strategy and multaq reasonable to try in the interim if dilt isnt sufficinet The fact that he has minmal symptoms is only mildly reassuring as he spends hours in this rhtyhm and cardiomyopathy is hence a real issue   Virl Axe

## 2013-03-22 NOTE — Progress Notes (Addendum)
Subjective: Patient had 2 short episodes of sharp chest pain overnight. VSS. Patient feels as though his back pain is well controlled. Nutrition saw patient and recommended adding ensure TID. T max 100.1. Will go for lexiscan myoview today.   Objective: Vital signs in last 24 hours: Filed Vitals:   03/21/13 1400 03/21/13 2100 03/22/13 0514 03/22/13 1045  BP: 149/72 123/57 137/63 138/75  Pulse: 75 73 80   Temp: 98.5 F (36.9 C) 99.2 F (37.3 C) 100.1 F (37.8 C) 98.8 F (37.1 C)  TempSrc: Oral Oral Oral   Resp: 16 18 18    Weight:   171 lb 6.7 oz (77.755 kg)   SpO2: 100% 92% 97%    Weight change: -7.7 oz (-0.218 kg)  Intake/Output Summary (Last 24 hours) at 03/22/13 1136 Last data filed at 03/22/13 0954  Gross per 24 hour  Intake      0 ml  Output   1000 ml  Net  -1000 ml   Physical Exam General: alert, cooperative, and in no apparent distress; sitting upright in bed HEENT: pupils equal round and reactive to light, vision grossly intact, oropharynx clear and non-erythematous  Neck: supple, +bruit over L carotid artery Lungs: clear to ascultation bilaterally, normal work of respiration, no wheezes, rales, ronchi; sternum and L side of chest TTP Heart: regular rate and 123456 systolic ejection murmur Abdomen: soft, non-tender, non-distended, normal bowel sounds Back: there is tenderness over the midline of lower back and also over the paraspinal muscle areas on both sides; there is some skin thickening and hyperpigmentation to lower back Extremities: warm b/l, no BLE edema Neurologic: alert & oriented X3, cranial nerves II-XII grossly intact, strength grossly intact, sensation intact to light touch  Lab Results: Basic Metabolic Panel:  Recent Labs Lab 03/21/13 0338 03/22/13 0350  NA 133* 129*  K 4.8 4.6  CL 95* 94*  CO2 19 18*  GLUCOSE 162* 155*  BUN 28* 30*  CREATININE 1.85* 2.01*  CALCIUM 8.9 8.0*   Liver Function Tests:  Recent Labs Lab 03/20/13 0822    AST 12  ALT 11  ALKPHOS 252*  BILITOT 0.3  PROT 8.4*  ALBUMIN 2.8*   CBC:  Recent Labs Lab 03/20/13 0822  WBC 6.3  HGB 8.6*  HCT 28.9*  MCV 73.9*  PLT 370   Cardiac Enzymes:  Recent Labs Lab 03/20/13 1228 03/20/13 1950 03/21/13 0347  TROPONINI <0.30 <0.30 <0.30   BNP:  Recent Labs Lab 03/20/13 0822  PROBNP 4252.0*   D-Dimer:  Recent Labs Lab 03/20/13 1118  DDIMER 2.01*   CBG:  Recent Labs Lab 03/21/13 0512 03/21/13 0801 03/21/13 1152 03/21/13 1704 03/21/13 2128 03/22/13 0736  GLUCAP 161* 168* 229* 93 194* 132*   Hemoglobin A1C:  Recent Labs Lab 03/20/13 1228  HGBA1C 7.8*   Thyroid Function Tests:  Recent Labs Lab 03/20/13 1118  TSH 15.258*  FREET4 0.93   Coagulation:  Recent Labs Lab 03/20/13 1118  LABPROT 14.4  INR 1.14   Micro Results: Recent Results (from the past 240 hour(s))  CULTURE, BLOOD (ROUTINE X 2)     Status: None   Collection Time    03/21/13  9:40 AM      Result Value Range Status   Specimen Description BLOOD RIGHT HAND   Final   Special Requests BOTTLES DRAWN AEROBIC AND ANAEROBIC 10CC   Final   Culture  Setup Time     Final   Value: 03/21/2013 16:16     Performed at  [facility]     Final   Value:        BLOOD CULTURE RECEIVED NO GROWTH TO DATE CULTURE WILL BE HELD FOR 5 DAYS BEFORE ISSUING A FINAL NEGATIVE REPORT     Performed at [facility]   Report Status PENDING   Incomplete  CULTURE, BLOOD (ROUTINE X 2)     Status: None   Collection Time    03/21/13  9:50 AM      Result Value Range Status   Specimen Description BLOOD RIGHT HAND   Final   Special Requests BOTTLES DRAWN AEROBIC ONLY 5CC   Final   Culture  Setup Time     Final   Value: 03/21/2013 16:16     Performed at [facility]   Culture     Final   Value:        BLOOD CULTURE RECEIVED NO GROWTH TO DATE CULTURE WILL BE HELD FOR 5 DAYS BEFORE ISSUING A FINAL NEGATIVE REPORT     Performed at [facility]   Report Status PENDING   Incomplete   Studies/Results: Nm Pulmonary Perf And Vent  03/20/2013   CLINICAL DATA:  Chest pain  EXAM: NUCLEAR MEDICINE VENTILATION - PERFUSION LUNG SCAN  Views: Anterior, posterior, left lateral, right lateral, RPO, LPO, RAO, LAO -ventilation and perfusion  Radionuclide: Technetium 54m DTPA -ventilation; Technetium 58m macroaggregated albumin-perfusion  Dose:  40.0 mCi-ventilation; 6.0 mCi-perfusion  Route of administration: Inhalation -ventilation; intravenous -perfusion  COMPARISON:  Chest radiograph March 20, 2013  FINDINGS: Radiotracer uptake on the ventilation study is homogeneous and symmetric bilaterally.  Radiotracer uptake on the perfusion study is homogeneous and symmetric bilaterally.  There is no appreciable ventilation/perfusion mismatch.  IMPRESSION: No ventilation or perfusion abnormalities. Very low probability of pulmonary embolus.   Electronically Signed   By: Bretta Bang M.D.   On: 03/20/2013 16:15   2D echo 03/22/2012- Study Conclusions  - Left ventricle: The cavity size was mildly dilated. Wall thickness was normal. Systolic function was mildly reduced. The estimated ejection fraction was in the range of 45% to 50%. There is hypokinesis of the lateral myocardium. Doppler parameters are consistent with restrictive physiology, indicative of decreased left ventricular diastolic compliance and/or increased left atrial pressure. Doppler parameters are consistent with high ventricular filling pressure. - Aortic valve: Valve mobility was restricted. There was mild to moderate stenosis. Trivial regurgitation. Valve area: 1.56cm^2(VTI). Valve area: 1.47cm^2 (Vmax). - Mitral valve: Mild regurgitation. - Left atrium: The atrium was mildly dilated. - Pulmonary arteries: Systolic pressure was mildly increased. PA peak pressure: 80mm Hg (S).  Medications: I have reviewed the patient's current medications. Scheduled Meds: . amLODipine   10 mg Oral Daily  . aspirin  81 mg Oral Daily  . atenolol  50 mg Oral Daily  . atorvastatin  80 mg Oral q1800  . clopidogrel  75 mg Oral Q breakfast  . feeding supplement (ENSURE COMPLETE)  237 mL Oral TID BM  . heparin  5,000 Units Subcutaneous Q8H  . insulin aspart  0-9 Units Subcutaneous TID WC  . insulin glargine  24 Units Subcutaneous Daily  . levothyroxine  150 mcg Oral Daily  . methocarbamol  500 mg Oral BID  . mycophenolate  720 mg Oral BID  . pantoprazole  40 mg Oral Daily  . regadenoson  0.4 mg Intravenous Once  . sirolimus  4 mg Oral Daily  . sodium chloride  3 mL Intravenous Q12H  Continuous Infusions: . sodium chloride 75 mL/hr at 03/21/13 2314   PRN Meds:.sodium chloride, morphine injection, nitroGLYCERIN, oxyCODONE, oxyCODONE-acetaminophen, sodium chloride  Assessment/Plan:  # Chest pain: Patient has atypical chest pain and has tenderness to palpation to his sternum. ACS has been ruled out. 2D echo done today showed EF 45-50% with some hypokinesis to lateral myocardium and diastolic dysfunction. Will proceed with lexiscan myoview today. Telemetry w/ intermittent SVT (130s-140s)- work up per cardiology. - Nitroglycerin, Morphine, aspirin, lipitor, atenolol, Plavix -monitor on telemetry -consider d/c IVF today if patient does not need contrast load with cath  # Metastatic Prostate cancer with back pain: Patient does have metastases to L and T spine per MRI done a few weeks ago (despite the fact that patient had a negative bone scan done 03/03/2013). This is most likely the cause for his 2 month hx of back pain. No red flag s/s. I spoke with Dr. Diona Fanti ([phone]) today and he said that patient will require a prostate and bone biopsy at some point, though this can be done as outpatient. He had ordered a lumbar MRI w/ and w/o contrast given the last lumbar spine MRI was done w/o contrast and was not as clear as he'd hoped. We agreed to postpone the repeat MRI until kidney  function recovers. He will reschedule the MRI and schedule follow up for patient. -Pain well controlled on percocet   # Fever: Patient spiked fever to 101.7 two nights ago, Tmax in last 24hrs 100.1. Patient does not have night sweats or subjective F/C. Unclear etiology. May be 2/2 his metastatic cancer. No source of infection identified at this time, CXR negative and UA wnl. Will continue to follow. -no antibiotics for now -BCx x 2 NGTD -if patient spikes fever again overnight, please reculture  # Acute on CKD w/ hx of bilateral kidney transplantation: Cr had been at baseline on admission, though steadily increasing and is now 2.01 (b/l 1.7-1.8). Not likely prerenal given we have been giving NS at 75cc/hr x 2 days. Perhaps underlying chronic rejection vs sirolimus side effect. I spoke with radiology and VQ scan would not cause AKI. Unlikely postrenal, though pt does have h/o prostate cancer so will check renal u/s. Followed by Dr. Florene Glen. Currently he is on sirolimus 4 mg dialy and Myfortic EC 720 bid at home. AG is elevated at 18. -continue home meds  -check mycophenolate and sirolimus levels -renal ultrasound -renal panel this afternoon, BID BMPs -consider d/c IVF this afternoon if no cath  # HTN: bp is stable, 137/63 today. -continue home meds: Amlodipine 10 mg daily and atenolol 50 mg daily  #: DM-II: A1c 7.8 on admission. On Lantus 24 U daily at home. CBGs controlled, 93-194. -will continue lantus to 24U qHS -SSI   #: Hypothyroidism: TSH 15.2, free T4 .93 (wnl) on admission. He is s/p of papillary thyroid cancer with removal. On Synthroid 150 mcg daily at home. This will likely need to be increased. Will need PCP follow up. -will continue home medications for now   # DVT px: Heparin sq  Dispo: Disposition is deferred at this time, awaiting improvement of current medical problems.  Anticipated discharge in approximately 1 day(s).   The patient does have a current PCP (Sueanne Margarita,  MD) and does not need an [facility] hospital follow-up appointment after discharge.  The patient does not have transportation limitations that hinder transportation to clinic appointments.  .Services Needed at time of discharge: Y = Yes, Blank = No PT:  OT:   RN:   Equipment:   Other:     LOS: 2 days   Rebecca Eaton, MD 03/22/2013, 11:36 AM

## 2013-03-22 NOTE — Progress Notes (Signed)
Heart rate 127. Has been febrile. Temp now 98.8. Patient does not feel well, tired, feels cold and some shivers. BP stable. B Simmons present and aware. Quenton Fetter PA in to assess and [place] held. He spoke to a cardiologist and to go ahead and give nuclear tracer and  to complete stress portion as heart rate rapid.  No need to monitor patient as per B. Rosita Fire PA.

## 2013-03-22 NOTE — Progress Notes (Signed)
Spoke to patients nurse on 3W, Primitivo Gauze to update her on patient heart rate, feels unwell, cold and shivers and that B Togiak PA aware of these symptoms.

## 2013-03-22 NOTE — Progress Notes (Signed)
Subjective:  The patient is post nuclear stress test. He had a small episode of chest pain this morning. Not aware of tachyarrhythmia, no palpitations.  Objective: Vital signs in last 24 hours: Temp:  [98.5 F (36.9 C)-100.1 F (37.8 C)] 100.1 F (37.8 C) (01/14 0514) Pulse Rate:  [73-80] 80 (01/14 0514) Resp:  [16-18] 18 (01/14 0514) BP: (123-149)/(57-72) 137/63 mmHg (01/14 0514) SpO2:  [92 %-100 %] 97 % (01/14 0514) Weight:  [171 lb 6.7 oz (77.755 kg)] 171 lb 6.7 oz (77.755 kg) (01/14 0514)    Intake/Output from previous day: 01/13 0701 - 01/14 0700 In: -  Out: 750 [Urine:750] Intake/Output this shift:    Medications Current Facility-Administered Medications  Medication Dose Route Frequency Provider Last Rate Last Dose  . 0.9 %  sodium chloride infusion  250 mL Intravenous PRN Ivor Costa, MD      . 0.9 %  sodium chloride infusion   Intravenous Continuous Ivor Costa, MD 75 mL/hr at 03/21/13 2314    . amLODipine (NORVASC) tablet 10 mg  10 mg Oral Daily Ivor Costa, MD   10 mg at 03/21/13 1013  . aspirin chewable tablet 81 mg  81 mg Oral Daily Ivor Costa, MD   81 mg at 03/21/13 1014  . atenolol (TENORMIN) tablet 50 mg  50 mg Oral Daily Ivor Costa, MD   50 mg at 03/21/13 1013  . atorvastatin (LIPITOR) tablet 80 mg  80 mg Oral q1800 Ivor Costa, MD   80 mg at 03/21/13 2105  . clopidogrel (PLAVIX) tablet 75 mg  75 mg Oral Q breakfast [name], MD   75 mg at 03/21/13 0815  . feeding supplement (ENSURE COMPLETE) (ENSURE COMPLETE) liquid 237 mL  237 mL Oral TID BM Dalene Carrow, RD   237 mL at 03/21/13 2105  . heparin injection 5,000 Units  5,000 Units Subcutaneous Q8H Ivor Costa, MD   5,000 Units at 03/21/13 1340  . insulin aspart (novoLOG) injection 0-9 Units  0-9 Units Subcutaneous TID WC [name], MD      . insulin glargine (LANTUS) injection 24 Units  24 Units Subcutaneous Daily Rebecca Eaton, MD   24 Units at 03/21/13 1014  . levothyroxine (SYNTHROID,  LEVOTHROID) tablet 150 mcg  150 mcg Oral Daily Ivor Costa, MD   150 mcg at 03/21/13 1013  . methocarbamol (ROBAXIN) tablet 500 mg  500 mg Oral BID Ivor Costa, MD   500 mg at 03/21/13 2235  . morphine 2 MG/ML injection 2 mg  2 mg Intravenous Q4H PRN Ivor Costa, MD   2 mg at 03/20/13 1736  . mycophenolate (MYFORTIC) EC tablet 720 mg  720 mg Oral BID Ivor Costa, MD   720 mg at 03/21/13 2314  . nitroGLYCERIN (NITROSTAT) SL tablet 0.4 mg  0.4 mg Sublingual Q5 min PRN Osvaldo Shipper, MD   0.4 mg at 03/20/13 4196  . oxyCODONE (Oxy IR/ROXICODONE) immediate release tablet 5 mg  5 mg Oral Q4H PRN [name], MD   5 mg at 03/22/13 0522  . oxyCODONE-acetaminophen (PERCOCET/ROXICET) 5-325 MG per tablet 1 tablet  1 tablet Oral Q4H PRN [name], MD   1 tablet at 03/22/13 0522  . pantoprazole (PROTONIX) EC tablet 40 mg  40 mg Oral Daily Ivor Costa, MD   40 mg at 03/21/13 1014  . sirolimus (RAPAMUNE) tablet 4 mg  4 mg Oral Daily Ivor Costa, MD   4 mg at 03/21/13 1013  .  sodium chloride 0.9 % injection 3 mL  3 mL Intravenous Q12H Ivor Costa, MD   3 mL at 03/20/13 1307  . sodium chloride 0.9 % injection 3 mL  3 mL Intravenous PRN Ivor Costa, MD        PE: General appearance: alert, cooperative and no distress Lungs: clear to auscultation bilaterally Heart: regular rate and rhythm, 1/6 sys MM Extremities: No LEE Pulses: 2+ and symmetric Skin: Warm and dry Neurologic: Grossly normal  Lab Results:   Recent Labs  03/20/13 0822  WBC 6.3  HGB 8.6*  HCT 28.9*  PLT 370   BMET  Recent Labs  03/20/13 0822 03/21/13 0338 03/22/13 0350  NA 133* 133* 129*  K 4.6 4.8 4.6  CL 93* 95* 94*  CO2 20 19 18*  GLUCOSE 221* 162* 155*  BUN 24* 28* 30*  CREATININE 1.76* 1.85* 2.01*  CALCIUM 9.2 8.9 8.0*   PT/INR  Recent Labs  03/20/13 1118  LABPROT 14.4  INR 1.14   Echo: Study Conclusions  - Left ventricle: The cavity size was mildly dilated. Wall thickness was normal. Systolic function was  mildly reduced. The estimated ejection fraction was in the range of 45% to 50%. There is hypokinesis of the lateral myocardium. Doppler parameters are consistent with restrictive physiology, indicative of decreased left ventricular diastolic compliance and/or increased left atrial pressure. Doppler parameters are consistent with high ventricular filling pressure. - Aortic valve: Valve mobility was restricted. There was mild to moderate stenosis. Trivial regurgitation. Valve area: 1.56cm^2(VTI). Valve area: 1.47cm^2 (Vmax). - Mitral valve: Mild regurgitation. - Left atrium: The atrium was mildly dilated. - Pulmonary arteries: Systolic pressure was mildly increased. PA peak pressure: 64mm Hg (S). Transthoracic echocardiography. M-mode, complete    Assessment/Plan   Principal Problem:   Chest pain Active Problems:   Occlusion and stenosis of carotid artery without mention of cerebral infarction   Coronary artery disease   Hypertension   Dyslipidemia, goal LDL below 70   CHF (congestive heart failure)   Hypothyroidism   Back pain   Prostate cancer   Hyponatremia  Plan: 2D echo:  EF 45-50%, There is hypokinesis of the lateral myocardium.  Previous in 03/2009 50-55% with no identified WMA.  SCr increased slightly.  VQ with low probability of PE.  Sudden onset of tachycardia.  The patient had ~3 hour period with abrupt end.  Stress test completed.    LOS: 2 days    Joshua [name] 03/22/2013 9:09 AM  The patient was seen, examined and discussed with Tarri Fuller, PA-C and I agree with the above.   The patient is undergoing a nuclear stress test today. Telemetry shows episodes of regular SVTs with sudden onset and termination, lasting 20 minutes to 1 hour, HR 137 BPM< most probably atrial tachycardia. We will consult EP for further therapy.   Ena Dawley, Lemmie Evens 03/22/2013

## 2013-03-22 NOTE — Discharge Summary (Signed)
Name: Joshua [name] MRN: [id number] DOB: 1950-05-13 63 y.o. 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 cerebral infarction   Coronary artery disease   Hypertension   Dyslipidemia, goal LDL below 70   CHF (congestive heart failure)    Hypothyroidism   Back pain   CKD- w/ AKI, likely ATN 2/2 GI bleed combined with use of multaq   Prostate cancer- likely metastatic to spine   Protein-calorie malnutrition, severe   GI bleed- unknown source even after EGD, colonoscopy, barium enema  Discharge Medications:   Medication List    STOP taking these medications       amLODipine 10 MG tablet  Commonly known as:  NORVASC     atenolol 50 MG tablet  Commonly known as:  TENORMIN     clopidogrel 75 MG tablet  Commonly known as:  PLAVIX     oxyCODONE-acetaminophen 10-325 MG per tablet  Commonly known as:  PERCOCET      TAKE these medications       aspirin 81 MG chewable tablet  Chew 81 mg by mouth daily.     atorvastatin 80 MG tablet  Commonly known as:  LIPITOR  TAKE 1 TABLET BY MOUTH EVERY DAY     diltiazem 120 MG 24 hr capsule  Commonly known as:  CARDIZEM CD  Take 1 capsule (120 mg total) by mouth daily.     insulin lispro 100 UNIT/ML injection  Commonly known as:  HUMALOG  Inject 6 Units into the skin 3 (three) times daily as needed (for sugars over 280).     isosorbide mononitrate 30 MG 24 hr tablet  Commonly known as:  IMDUR  Take 1 tablet (30 mg total) by mouth daily.     LANTUS 100 UNIT/ML injection  Generic drug:  insulin glargine  Inject 24 Units into the skin at bedtime.     levothyroxine 150 MCG tablet  Commonly known as:  SYNTHROID, LEVOTHROID  Take 150 mcg by mouth daily.     methocarbamol 500 MG tablet  Commonly known as:  ROBAXIN  Take 1 tablet (500 mg total) by mouth 2 (two) times daily.     metoprolol 50 MG tablet  Commonly known as:  LOPRESSOR  Take 2 tablets (100 mg total) by mouth 2 (two) times daily.     mycophenolate 180 MG EC tablet  Commonly known as:  MYFORTIC  Take 720 mg by mouth 2 (two) times daily.     Oxycodone HCl 10 MG Tabs  Take 1 tablet (10 mg total) by mouth every 3 (three) hours as needed for severe pain (give with one percocet).     pantoprazole 40 MG tablet  Commonly known as:  PROTONIX  Take 40 mg by mouth daily.     sirolimus 1 MG tablet  Commonly known as:  RAPAMUNE  Take 2 tablets (2 mg total) by mouth daily.     sodium bicarbonate 650 MG tablet  Take 1 tablet (650 mg total) by mouth 2 (two) times daily.       Disposition and follow-up:   Joshua [name] was discharged from [facility] in Stable condition.  At the hospital follow up visit please address:  1.  Acute on CKD w/ Hx of  kidney transplantation: follow up Cr since patient is on sirolimus and diltiazem (diltiazem increases risk of sirolimus toxicity) and his Cr continued to rise at time of his discharge (thought to be due to ATN? Perhaps due to recent use of multaq). Patient may need follow up with [facility] for kidney transplant biopsy if Cr continues to rise 2. HCAP: patient treated for HCAP, please assess for resolution of symptoms as well as plan for repeat CXR in approx 4 weeks 3. Atrial Tachycardia: Patient has close cardiology follow up. He was discharged on diltiazem and metoprolol. 4. GI bleed: pt with rectal bleed this admission and underwent colonoscopy and EGD; EGD wnl, though colonoscopy incomplete, so received barium enema which did no should mass or source of bleeding; he will need hemoglobin followed up. He was discharged OFF of all antiplatelets (discontinued his ASA and plavix, though may consider restarting these if Hb remains  stable)  2.  Labs / imaging needed at time of follow-up: Cr, CXR in 4 weeks, CBC, BMP  3.  Pending labs/ test needing follow-up: none  Follow-up Appointments: Follow-up Information   Follow up with Estanislado Emms, MD On 05/02/2013. (8:00am)    Specialty:  Nephrology   Contact information:   [place] [address] [phone]       Follow up with Estanislado Emms, MD. (For Labs: )    Specialty:  Nephrology   Contact information:   [place] [address] [phone]       Follow up with Estanislado Emms, MD. (For Labs on Friday (1/30) and Monday (2/2) @ 8:30)    Specialty:  Nephrology   Contact information:   [place] [address] [phone]       Follow up with Rebecca Eaton, MD On 04/13/2013. (at 2:45pm)    Specialty:  Internal Medicine   Contact information:   [place] [address] [phone]       Follow up with Truitt Merle, NP On 04/25/2013. (at [place])    Specialty:  Nurse Practitioner   Contact information:   [place] [place] [address] [phone]       Follow up with Jorja Loa, MD On 04/12/2013. (at 3:45pm)    Specialty:  Urology   Contact information:   [place] [facility]  [place]  [address] [phone]       Discharge Instructions:  Future Appointments Provider Department Dept Phone   04/12/2013 2:00 PM Blanchie Serve, MD [facility] [phone]   04/13/2013 2:45 PM Rebecca Eaton, MD [place] [phone]   04/25/2013 9:00 AM Burtis Junes, NP [place] Office [phone]   06/12/2013 8:30 AM [facility] [place] [facility] ECHO LAB [phone]   07/12/2013 8:10 AM [facility] [place] [phone]   11/01/2013 2:00 PM [facility] [place] [phone]   11/01/2013 3:00 PM Sharmon Leyden Nickel, NP [facility] [phone]     Consultations:  Cardiology: Dr. Angelena Form Electrophysiology: Dr. Rayann Heman Nephrology: Placido Sou, MD GI: Lafayette Dragon, MD ID: Dr. Carlyle Basques  Procedures Performed:  Dg Chest 2 View  03/27/2013 CLINICAL DATA: Cough, hemoptysis EXAM: CHEST 2 VIEW COMPARISON: 03/23/2013 FINDINGS: Multifocal patchy airspace opacities, most prominent in the right perihilar region, mildly increased. Differential considerations include mild to moderate interstitial edema or possibly multifocal infection. Suspected small left pleural effusion. No pneumothorax. Cardiomegaly. Surgical clips overlying the bilateral neck. IMPRESSION: Multifocal patchy airspace opacities, most prominent in the right perihilar region, mildly increased. Differential considerations include mild to moderate interstitial edema or possibly multifocal infection. Cardiomegaly with suspected small left pleural effusion. Electronically Signed By: Julian Hy M.D. On: 03/27/2013 13:00   Dg Chest 2 View  03/23/2013 CLINICAL DATA: Chest pain with history of congestive heart failure EXAM: CHEST 2 VIEW COMPARISON: 03/20/2013 FINDINGS: Heart size upper normal. Mild vascular congestion has developed when compared to the prior study. There is bilateral perihilar peribronchial cuffing with indistinct bilateral perihilar vessels and mild hazy opacity in the perihilar regions bilaterally. There are no pleural effusions. Surgical clips in the thyroid bed again identified. IMPRESSION: Findings suggest development of congestive heart failure with mild pulmonary edema. Electronically Signed By: Skipper Cliche M.D. On: 03/23/2013 16:22   Dg Chest 2 View  03/20/2013 CLINICAL DATA: Chest and back pain. Hypertension. EXAM: CHEST 2 VIEW COMPARISON: 08/19/2011 FINDINGS: The lungs are clear without focal infiltrate,  edema, pneumothorax or pleural effusion. Cardiopericardial silhouette is at upper limits of normal for size. Imaged bony structures of the thorax are intact. Surgical clips are seen in the thyroid bed and left axilla. IMPRESSION: Stable. No acute cardiopulmonary process. Electronically Signed By: Misty Stanley M.D. On: 03/20/2013 07:45   Ct Chest Wo Contrast  03/27/2013 CLINICAL DATA: Chest pain and shortness of breath for several months. Low-grade fever. Acute renal failure in transplant patient. EXAM: CT CHEST WITHOUT CONTRAST TECHNIQUE: Multidetector CT imaging of the chest was performed following the standard protocol without IV contrast. COMPARISON: Chest radiograph performed earlier today at 11:45 a.m. FINDINGS: Diffuse fluffy bilateral central airspace opacity is seen, most prominent at the upper lung lobes bilaterally. This is most compatible with diffuse pneumonia. Underlying edema cannot be excluded, given underlying small bilateral pleural effusions and interstitial prominence. No pneumothorax is seen. Scattered coronary artery calcifications are seen. The heart is mildly enlarged. A mildly prominent right paratracheal node is seen, measuring 1.3 cm in short axis. No pericardial effusion is identified. The great vessels are grossly unremarkable in appearance. The patient is status post thyroidectomy. No axillary lymphadenopathy is seen. The visualized portions of the liver and the spleen are unremarkable in appearance. Severe bilateral renal atrophy is seen, with scattered calcifications noted bilaterally. A small hiatal hernia is noted. IMPRESSION: 1. Diffuse fluffy bilateral central airspace opacification, most prominent at the upper lung lobes. This is most compatible with diffuse multifocal pneumonia. Underlying edema cannot be excluded, given small bilateral pleural effusions and interstitial prominence. 2. Scattered coronary calcifications seen. 3. Mild cardiomegaly. 4. Mildly prominent right  paratracheal node, measuring 1.3 cm in short axis. 5. Severe bilateral renal atrophy, with scattered associated calcifications. 6. Small hiatal hernia seen. Electronically Signed By: Garald Balding M.D. On: 03/27/2013 21:48   Nm Bone Scan Whole Body  03/03/2013 CLINICAL DATA: Pain. Prostate cancer. EXAM: NUCLEAR MEDICINE WHOLE BODY BONE SCAN TECHNIQUE: Whole body anterior and posterior images were obtained approximately 3 hours after intravenous injection  of radiopharmaceutical. COMPARISON: Bone scan 10/26/2007. CT 02/28/2013. RADIOPHARMACEUTICALS: 26.0 mCi Technetium-99 MDP FINDINGS: Bilateral renal atrophy with a transplanted kidney right pelvis. No focal bony abnormality. No evidence of bony metastatic disease. IMPRESSION: No evidence of bony metastatic disease or focal bony abnormality. Bilateral renal atrophy. Transplanted kidney right pelvis. Electronically Signed By: Marcello Moores Register On: 03/03/2013 14:02   US Renal  03/22/2013 CLINICAL DATA: Acute kidney injury. Patient with bilateral renal transplant. EXAM: RENAL/URINARY TRACT ULTRASOUND COMPLETE COMPARISON: CT, 02/28/2013 FINDINGS: Right Kidney: Length: 7.4 cm. Kidney is echogenic. 15 mm cyst arises from the upper pole. There are probable additional tiny cysts. No hydronephrosis. Left Kidney: Native left kidney not visualized. Transplant kidney: In the right lower quadrant to right upper pelvis, a transplant kidney is visualized. Is normal in overall size measuring 10.7 cm. It has normal parenchymal echogenicity. Small cyst arises from its lower pole measuring 1 cm. No other renal masses. No hydronephrosis. Bladder: Appears normal for degree of bladder distention. IMPRESSION: 1. Transplant kidney is normal in appearance within a small lower pole cyst measuring 1 cm. 2. Atrophic native kidneys. Only the right was visualized. Is echogenic with at least 1 discrete small cysts. No hydronephrosis. Electronically Signed By: Lajean Manes M.D. On: 03/22/2013  15:50   Nm Myocar Multi W/spect W/wall Motion / Ef  03/22/2013 CLINICAL DATA: 63yo AA man with PMH of HTN, DM2, CAD with stents, CAD, PVD s/p fem pop, prostate cancer treated with radiation, hx of thyroid cancer, h/o ESRD and kidney transplantation who presents for chest pain and back pain EXAM: MYOCARDIAL IMAGING WITH SPECT (REST) TECHNIQUE: Standard myocardial SPECT imaging was performed after resting intravenous injection of Tc-73m sestamibi. Quantitative gated imaging was also performed to evaluate left ventricular wall motion, and estimate left ventricular ejection fraction. As the patient's heart rate was 129 bpm when he presented for his "stress" images, no additional administration of Lexiscan was performed. COMPARISON: None. FINDINGS: The Image quality is good. There is mild interference from visceral racer uptake. The left ventricle is moderately to severely dilated. There is a moderate in size, moderate-to-severe in intensity inferior and inferolateral perfusion abnormality seen on both rest and "stress" images, possibly worse on the "stress" images. There is global hypokinesis of the left ventricle, with severe basal and mid inferior wall hypokinesis. EF 34%. The severity of LV dysfunction is out of proportion to the perfusion abnormality. IMPRESSION: Interpretation of the study is severely limited by absence of a clear increase in coronary flow during acquisition of the "stress" images There is evidence of severe ischemia at rest and/or scar in the right coronary artery distribution. This appears to be at least partly reversible. The degree of LV dysfunction suggests multivessel CAD versus a component of superimposed nonischemic cardiomyopathy. No previous studies are available for comparison Electronically Signed By: Sanda Klein On: 03/22/2013 19:29   Nm Pulmonary Perf And Vent  03/20/2013 CLINICAL DATA: Chest pain EXAM: NUCLEAR MEDICINE VENTILATION - PERFUSION LUNG SCAN Views: Anterior,  posterior, left lateral, right lateral, RPO, LPO, RAO, LAO -ventilation and perfusion Radionuclide: Technetium 27m DTPA -ventilation; Technetium 30m macroaggregated albumin-perfusion Dose: 40.0 mCi-ventilation; 6.0 mCi-perfusion Route of administration: Inhalation -ventilation; intravenous -perfusion COMPARISON: Chest radiograph March 20, 2013 FINDINGS: Radiotracer uptake on the ventilation study is homogeneous and symmetric bilaterally. Radiotracer uptake on the perfusion study is homogeneous and symmetric bilaterally. There is no appreciable ventilation/perfusion mismatch. IMPRESSION: No ventilation or perfusion abnormalities. Very low probability of pulmonary embolus. Electronically Signed By: Lowella Grip M.D. On: 03/20/2013 16:15  Barium Enema 04/04/2013: FINDINGS:  Initial scout view of the abdomen demonstrates diffuse gaseous  distention throughout the colon. Several nondilated loops of  gas-filled small bowel are noted. Multiple surgical clips are noted.  Filling of the entire colon with barium was challenging in this  patient secondary to extreme colonic redundancy. However, barium  eventually reached the cecum. Contrast was then drained, and air  insufflated. Double contrast images of the colon demonstrated  extreme colonic redundancy. No definite colonic mass identified. On  some images, small filling defects were noted within the descending  colon and distal transverse colon, however, these were not confirmed  on other views, favored to be transient related to residual fecal  contents. No definite polyps were confidently identified.  IMPRESSION:  1. No definite colonic mass or other source for rectal bleeding  confidently identified on today's limited examination.  2. Marked colonic redundancy noted.  2D echo 03/22/2012-  Study Conclusions  - Left ventricle: The cavity size was mildly dilated. Wall thickness was normal. Systolic function was mildly reduced. The estimated  ejection fraction was in the range of 45% to 50%. There is hypokinesis of the lateral myocardium. Doppler parameters are consistent with restrictive physiology, indicative of decreased left ventricular diastolic compliance and/or increased left atrial pressure. Doppler parameters are consistent with high ventricular filling pressure. - Aortic valve: Valve mobility was restricted. There was mild to moderate stenosis. Trivial regurgitation. Valve area: 1.56cm^2(VTI). Valve area: 1.47cm^2 (Vmax). - Mitral valve: Mild regurgitation. - Left atrium: The atrium was mildly dilated. - Pulmonary arteries: Systolic pressure was mildly increased. PA peak pressure: 6mm Hg (S).  Admission HPI:  Patient is 63 yo man with PMH of HTN, DM-II ( A1c 8.0 on 08/25/11), CAD (s/p of MI and stent placement in LAD), hx of papillary thyroid cancer, kidney transplantation, carotid artery stenosis (S./P. right endoarterectomy on 08/25/11), PVD (S./P. Fempop in 1980), prostate cancer (s/p of radiation therapy per patient), who presents with chest pain and back pain.   Patient reports that his chest pain started 3 days ago while he was sitting in the chair. The chest pain is located in the substernal area, 8 to 10 out of 10 in severity, throbbing and sharp and non radiating. It is aggravated by deep breath or coughing. The chest pain is intermittent. It happens every few hours. It lasts for a few minutes each time. Patient does not have cough, SOB, fever or chills. No history of recent traveling. No tenderness over the calf areas. His oxygen saturation is 97% on room air in ED. Patient is intermittently tachycardic to the HR of 140s in ED. His HR was 89/min when I evaluated patient in ED. Cardiology was consulted by Ed. Dr. Johnsie Cancel ordred Lexiscan Myoview.   Patient also reports having moderate back pain. It is located in the lower back at both midline and paraspinal areas on both sides. No urinary incontinence or lose control of  bowel moments. No weakness or numbness in the lower extremities.   Hospital Course by problem list:  # Chest pain in the setting of CAD, dCHF: Patient initially presented w/ c/o atypical chest pain. Given his significant hx of CAD, we ruled out ACS with three negative troponins. Risk stratification: TSH 15.2, A1c 7.8. PE was essentially ruled out with negative VQ scan. Cardiology was consulted. 2D echo showed EF 45-50% with some hypokinesis to lateral myocardium and diastolic dysfunction. Lexiscan myoview was limited study that showed ischemia at rest vs scar in RCA distribution (likely  from patient's prior MI). Cardiology thought patient was slightly fluid overloaded so we gave patient lasix 40mg  IV x 1 dose on 1/15 which resulted in a good diuresis, though some residual fluid overload. Repeat CXR showed questionable pulmonary edema. He was then started on lasix 40mg  IV BID x 2 days, which resulted in good diuresis, but increased serum creatinine (see below for more details on AKI). Cardiology also started patient on IMDUR 30mg  daily for his known dCHF. Of note, patient had intermittent episodes of chest pain during the first few days of his hospitalization, though these epsiodes subsided and since ACS was ruled out and he was TTP on his chest wall, we were comfortable calling this atypical chest pain. However, on 03/17/13, patient had repeat chest pain that was 10/10 w/ some typical associated symptoms (nausea, SOB), so we cycled cardiac enzymes again. ACS was again ruled out. ProBNP further increased from 4252 on admission to 19,450, which was thought to be 2/2 patient's worsening kidney function rather than volume overload. Chest pain remained well controlled with oxycodone. This is perhaps musculoskeletal in nature given he continues to have tenderness with palpation over his sternum. Ultimately, cardiology did not want to catheterize patient given his acute on CKD, although this was discussed as an option  given his significant CAD hx. Patient was continued on his home ASA and plavix initially, though after his GI bleed (see below), these medications were stopped and held at discharge. He has close follow up with cardiology at which time, if hemoglobin remains stable, can consider restarting. However, please note that patient is considered a plavix nonresponder with p2y12 level of 365.  His home lipitor was continued. PT/OT both recommended [facility] services, therefore patient was discharged with [facility] PT/OT.  Atrial Tachycardia: Patient was noted to have episodes of atrial tachycardia (HR 120s-130s) during his hospitalization, which I do not think is related to his chest pain. He remained asymptomatic throughout his entire hospitalization. EP was consulted and pt was started on diltiazem 60mg  TID initially. This was up titrated to 180mg  BID with good control of his HR. However, it was noted that diltiazem increases siroliumis toxicity (pt with increasing Cr and elevated sirolimus level, see below), therefore this was stopped and patient was started on metoprolol tartrate 25mg  q8h. Metoprolol was then up titrated to 50mg  q6h, but patient still with poor HR control. Therefore, cardiology and nephrology agreed to add back diltiazem 120mg  daily and concurrently decrease sirolimus from 3mg  daily to 2mg  daily. Patient had fair response to addition of diltiazem, but HR control was not adequate for discharge. Therefore, Multaq 400mg  BID was started on 1/24. Patient had better rate control w/ this regimen. However, multaq also inhibits sirolimus metabolism, therefore this medication was stopped. Patient remained on diltiazem 120mg  daily and metoprolol 50mg  q6h with good rate control (only a couple brief episodes over 24hr period). Electrophysiologist saw patient prior to discharge and recommended discontinuing telemetry as patient was asymptomatic as well as consolidating metoprolol dose to 100mg  BID at discharge. Patient was  discharged on this regimen with close cardiology follow up.  HCAP: Patient had fever 101.7 on night after his admission, though for the rest of his hospitalization his temp was persistently between 98-100. Unclear if perhaps patient's baseline temperature is slightly high, perhaps 2/2 ?metastatic cancer (see below). Initial CXR was clear, though pt developed new productive cough with some blood tinged sputum on 1/19. He also developed mild leukocytosis (WBC count peaked at 11.0). We obtained a repeat  CXR, which showed multifocal patchy airspace opacities, most prominent in the right perihilar region as well as cardiomegaly and small L pleural effusion. Given patient is immunosuppressed, we consulted ID who initiated the following tests: quantiferon, CMV pcr, EBV pcr, BK virus pcr, respiratory virus panel, aspergillus antigen, blood cultures x 2--all were negative. Sputum culture grew normal oropharyngeal flora. Fungal cx and AFB cx as well as pneumocystis smear were never collected. UA was clean and UCX showed no growth. Chest CT scan was performed to further characterize patient's lung process. CT scan showed multifocal pneumonia w/ small b/l pleural effusions. Per ID, patient was started on vancomycin and cefepime, which he received for a total of 3 days at which point he was transitioned to levaquin 750mg  PO q48hrs--total of 8 day course of antibiotics. Pulmonology was also consulted, who did not think bronchoscopy was indicated. Patient did require 2-3LPM supplemental oxygen for approx 2 days, though patient responded well to antibiotic therapy and as his cough/sputum production improved, he no longer required oxygen. However, on day of discharge patient was desatting to 86% on room air with ambulation only, so he was discharged with home oxygen at 2LPM only with activity. His oxygen requirement was thought to be 2/2 residual clearing of pneumonia and will likely be temporary. Of note, patient's temperature  did seem to normalize a couple days prior to discharge. Unsure if patient's baseline is slightly high and/or if the elevation we saw was possibly 2/2 metastatic cancer vs effect of his active lung infection. No suspicion for continued infection at time of discharge.  Acute GI bleed w/ acute drop in hemoglobin: Patient's Hb dropped to 5.7 (from 7.7) overnight on 1/19 after he had BRB in his diarrhea 2/2 kayexalate treatment (see "hyperkalemia" below). Diarrhea and blood per rectum stopped after two episodes and patient had no more diarrhea or blood in his stool for the rest of his hospital stay. Patient received 1U pRBCs with repeat Hb 8.1. Prophylactic heparin was stopped, ASA and plavix were held. Hb remained stable for the rest of his hospitalization. GI was consult and they performed EGD and colonoscopy on 03/31/13. EGD was wnl, though colonoscopy was limited 2/2 scope unable to proceed passed large ventral hernia at the hepatic flexure. Patient's Hb decreased again on 1/26 (Hb 8.2-->6.9) despite the fact he had not had any other visible bleeding per rectum. Pt received another unit of pRBCs with post transfusion Hb 8.7 (hemoglobin remained stable at 8.3 on morning of discharge). GI completed their work up with barium enema, which was performed on 1/27 and showed no evidence of mass or source of bleeding. Plavix and ASA were held at discharge with a plan to restart as tolerated as outpatient (will require hemoglobin check).  Hyperkalemia: Patient found to have K 5.6 -->repeat was K 6.1 03/28/2013 with peaking of T waves on EKG. He received total of 45g of kayexalate. Pt received calcium gluconate 1g, aspart insulin 10U w/ amp of D50. Repeat EKG showed peaked T waves, therefore pt was given another dose of calcium gluconate 2g. Repeat BMP showed K 4.8. Potassium level remained stable for the remainder of his hospitalization.   #Increased Anion Gap Metabolic acidosis: AG [id number] throughout stay. Likely 2/2 CKD.  Lactic acid 1.1 and salicylate level wnl. Renal began sodium bicarbonate 1300mg  TID on 1/19, though this was decreased to bicarbonate 650mg  BID on 1/23 (this was continued at discharge).  # Metastatic Prostate cancer with back pain: Patient does have likely metastases to L  and T spine per MRI done a few weeks ago (despite the fact that patient had a negative bone scan done 03/03/2013). This is most likely the cause for his 2 month hx of back pain. No red flag s/s. PSA 18.67 this admission, which according to pt's urologist would be expected to be much higher if these lesions were in fact prostate cancer mets. Pt is s/p of radiation therapy about 4 to 5 years ago per patient and follows with Dr. Diona Fanti [phone]) who stated to me that he would like to work this up as an outpatient once acute issues are resolved. I scheduled a follow up appointment with Dr. Diona Fanti for patient. Patient's pain was fairly well controlled with oxycodone 10mg  q3h prn.  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 Florene Glen. Upon admission, he was on sirolimus 4 mg dialy and Myfortic EC 720 bid at home. Baseline creatinine is between 1.7-1.8. Cr had been stable and at baseline on admission, though patient's Cr increased (peaked at Cr 2.47) after lasix therapy was initiated by cardiology for mild volume overload after patient received IVF (in preparation for possible contrast load w/ potential heart cath). Postrenal AKI was ruled out with renal ultrasound which was only significant for small lower pole cyst to transplant kidney, atrophic original kidneys w/ small cyst to R kidney. Cr down trended nicely after lasix therapy was stopped, however a few days later began to increase again. Patient had been started on diltiazem for heart rate control (see above), which is known to interact with sirolimus to increase sirolimus toxicity (sirolimus level found to be elevated at 23.9 on 1/19). Patient's increase in Cr  was attributed to sirolimus toxicity. Renal was consulted and they held sirolimus for three days, the restarted at 3mg  daily. Cardiology switched patient over from diltiazem to metoprolol for rate control. However, cardiology felt as though adequate HR control was not obtained with metoprolol and therefore added back diltiazem at a lower dose. Nephrology then decreased sirolimus level further to 2mg  daily. Cr down trended and remained stable around 2  (2.1 on 1/23, 2.18 on 1/24). Until 1/25 when Cr began to trend up again. Thought to be 2/2 sirolimus toxicity again since patient at this time was on diltiazem and multaq for HR control--however, this was not the case given sirolimus level from 04/02/2013 was within therapeutic range (9.4). Therefore, this AKI was explained as possible ATN from combination of his GI bleed and recent addition of multaq (multaq has direct effect on kidney to cause AKI). Patient's Cr continued to rise on morning of discharge (3.68-->4.02 on discharge). Renal was comfortable with discharging patient with a rising Cr because patient will have close follow up in two days (1/30) to get his Cr checked again. Patient may require transplant kidney biopsy at [facility] (where he received his transplant) if his Cr continues to rise.  HTN: Blood pressure remained stable throughout hospitalization. Initially his home meds (Amlodipine 10 mg daily and atenolol 50 mg daily) were continued. However, these were discontinued as per above and patient was managed ultimately on cardizem 120mg  daily, IMDUR 30mg  daily, and lopressor 100mg  BID.  DM-II: A1c 7.8 on admission. On Lantus 24 U daily at home. CBGs fairly controlled on lantus 24U qHS and SSI at first, however, CBGs began increasing during his hospital stay so lantus was increased to 28UqHS. He was discharged on his home insulin regimen.   Hypothyroidism: TSH 15.2, free T4 .93 (wnl). on admission.  He is s/p of papillary thyroid cancer. On Synthroid 150  mcg daily at home, which we continued during his admission. Synthroid will likely need to be increased as outpatient given elevated TSH.   Discharge Vitals:   BP 131/56  Pulse 77  Temp(Src) 98.4 F (36.9 C) (Oral)  Resp 18  Ht 5\' 11"  (1.803 m)  Wt 182 lb 15.7 oz (83 kg)  BMI 25.53 kg/m2  SpO2 100%  Discharge Labs:  Results for orders placed during the hospital encounter of 03/20/13 (from the past 24 hour(s))  GLUCOSE, CAPILLARY     Status: None   Collection Time    04/04/13  3:47 PM      Result Value Range   Glucose-Capillary 91  70 - 99 mg/dL  GLUCOSE, CAPILLARY     Status: Abnormal   Collection Time    04/04/13  8:11 PM      Result Value Range   Glucose-Capillary 122 (*) 70 - 99 mg/dL   Comment 1 Notify RN    GLUCOSE, CAPILLARY     Status: Abnormal   Collection Time    04/05/13 12:42 AM      Result Value Range   Glucose-Capillary 131 (*) 70 - 99 mg/dL  BASIC METABOLIC PANEL     Status: Abnormal   Collection Time    04/05/13  6:10 AM      Result Value Range   Sodium 135 (*) 137 - 147 mEq/L   Potassium 4.3  3.7 - 5.3 mEq/L   Chloride 91 (*) 96 - 112 mEq/L   CO2 19  19 - 32 mEq/L   Glucose, Bld 144 (*) 70 - 99 mg/dL   BUN 50 (*) 6 - 23 mg/dL   Creatinine, Ser 4.02 (*) 0.50 - 1.35 mg/dL   Calcium 6.7 (*) 8.4 - 10.5 mg/dL   GFR calc non Af Amer 15 (*) >90 mL/min   GFR calc Af Amer 17 (*) >90 mL/min  CBC     Status: Abnormal   Collection Time    04/05/13  6:10 AM      Result Value Range   WBC 8.6  4.0 - 10.5 K/uL   RBC 3.47 (*) 4.22 - 5.81 MIL/uL   Hemoglobin 8.3 (*) 13.0 - 17.0 g/dL   HCT 25.8 (*) 39.0 - 52.0 %   MCV 74.4 (*) 78.0 - 100.0 fL   MCH 23.9 (*) 26.0 - 34.0 pg   MCHC 32.2  30.0 - 36.0 g/dL   RDW 19.3 (*) 11.5 - 15.5 %   Platelets 346  150 - 400 K/uL  PHOSPHORUS     Status: Abnormal   Collection Time    04/05/13  6:10 AM      Result Value Range   Phosphorus 4.9 (*) 2.3 - 4.6 mg/dL  GLUCOSE, CAPILLARY     Status: Abnormal   Collection Time     04/05/13  7:04 AM      Result Value Range   Glucose-Capillary 143 (*) 70 - 99 mg/dL   Comment 1 Notify RN    GLUCOSE, CAPILLARY     Status: Abnormal   Collection Time    04/05/13 11:21 AM      Result Value Range   Glucose-Capillary 178 (*) 70 - 99 mg/dL   Comment 1 Documented in Chart     Comment 2 Notify RN      Signed: Rebecca Eaton, MD 04/05/2013, 2:27 PM   Time Spent on Discharge: 35 minutes Services Ordered on  Discharge: [facility] PT/OT Equipment Ordered on Discharge: walker w/ 5 inch wheels

## 2013-03-22 NOTE — Progress Notes (Signed)
  Date: 03/22/2013  Patient name: Joshua [name]  Medical record number: [id number]  Date of birth: 1950/11/03   This patient has been seen and the plan of care was discussed with the house staff. Please see their note for complete details. I concur with their findings with the following additions/corrections:  I have also reviewed nursing notes regarding HR (127 this AM at 1045 and then 52 at 1324) and fever (not documented, Tm was 100F in last 24 hours).  Unclear if Mr. Tanzi has an underlying infection.  Will monitor closely and culture for any temperature > 38.5C.   [name], MD 03/22/2013, 3:14 PM

## 2013-03-23 ENCOUNTER — Inpatient Hospital Stay (HOSPITAL_COMMUNITY): Payer: Medicare Other

## 2013-03-23 DIAGNOSIS — E872 Acidosis, unspecified: Secondary | ICD-10-CM

## 2013-03-23 DIAGNOSIS — I498 Other specified cardiac arrhythmias: Secondary | ICD-10-CM

## 2013-03-23 DIAGNOSIS — I471 Supraventricular tachycardia: Secondary | ICD-10-CM | POA: Diagnosis present

## 2013-03-23 DIAGNOSIS — I200 Unstable angina: Secondary | ICD-10-CM | POA: Diagnosis present

## 2013-03-23 DIAGNOSIS — I5032 Chronic diastolic (congestive) heart failure: Secondary | ICD-10-CM

## 2013-03-23 LAB — BASIC METABOLIC PANEL
BUN: 29 mg/dL — ABNORMAL HIGH (ref 6–23)
CO2: 20 mEq/L (ref 19–32)
Calcium: 8.4 mg/dL (ref 8.4–10.5)
Chloride: 98 mEq/L (ref 96–112)
Creatinine, Ser: 1.96 mg/dL — ABNORMAL HIGH (ref 0.50–1.35)
GFR calc non Af Amer: 35 mL/min — ABNORMAL LOW (ref >90)
GFR, EST AFRICAN AMERICAN: 41 mL/min — AB (ref >90)
Glucose, Bld: 171 mg/dL — ABNORMAL HIGH (ref 70–99)
POTASSIUM: 4.5 meq/L (ref 3.7–5.3)
Sodium: 135 mEq/L — ABNORMAL LOW (ref 137–147)

## 2013-03-23 LAB — GLUCOSE, CAPILLARY
GLUCOSE-CAPILLARY: 188 mg/dL — AB (ref 70–99)
Glucose-Capillary: 174 mg/dL — ABNORMAL HIGH (ref 70–99)
Glucose-Capillary: 134 mg/dL — ABNORMAL HIGH (ref 70–99)
Glucose-Capillary: 137 mg/dL — ABNORMAL HIGH (ref 70–99)

## 2013-03-23 MED ORDER — FUROSEMIDE 10 MG/ML IJ SOLN
40.0000 mg | Freq: Once | INTRAMUSCULAR | Status: AC
  Administered 2013-03-23: 40 mg via INTRAVENOUS
  Filled 2013-03-23: qty 4

## 2013-03-23 MED ORDER — ISOSORBIDE MONONITRATE ER 30 MG PO TB24
30.0000 mg | ORAL_TABLET | Freq: Every day | ORAL | Status: DC
  Administered 2013-03-23 – 2013-04-05 (×14): 30 mg via ORAL
  Filled 2013-03-23 (×14): qty 1

## 2013-03-23 MED ORDER — DILTIAZEM HCL ER COATED BEADS 180 MG PO CP24
180.0000 mg | ORAL_CAPSULE | Freq: Every day | ORAL | Status: DC
  Administered 2013-03-23 – 2013-03-25 (×3): 180 mg via ORAL
  Filled 2013-03-23 (×3): qty 1

## 2013-03-23 NOTE — Progress Notes (Signed)
Pt c/o 7/10 pressure in the sternum/epigastric area.  Administered 1 SL nitro with relief.  Pt also c/o SOB.  Sats were in the low 90's, dipping to 88.  2L O2 administered and sats up to 98-100.  Pt is resting now.  Will continue to monitor.

## 2013-03-23 NOTE — Progress Notes (Addendum)
Subjective: Patient had another episode of chest pain last night that was identical to his previous episodes. Nurse gave him one NTG SL. Chest pain only lasted for approx 2 minutes total. Pt's back pain is at a 4/10 this morning- still thinks this is well controlled on percocet. EP was consulted by cardiology. They recommended starting diltiazem for rate control.   Objective: Vital signs in last 24 hours: Filed Vitals:   03/22/13 1324 03/22/13 2139 03/23/13 0249 03/23/13 0537  BP: 156/52 127/59 133/59 142/61  Pulse: 52 73 79 74  Temp: 98.3 F (36.8 C) 99 F (37.2 C) 99 F (37.2 C) 99 F (37.2 C)  TempSrc: Oral Oral Oral Oral  Resp: 20 18 18 18   Weight:    171 lb 0.8 oz (77.589 kg)  SpO2: 98% 95% 99% 100%   Weight change: -5.9 oz (-0.166 kg)  Intake/Output Summary (Last 24 hours) at 03/23/13 0910 Last data filed at 03/23/13 0539  Gross per 24 hour  Intake      0 ml  Output    875 ml  Net   -875 ml   Physical Exam General: alert, cooperative, and in no apparent distress; sitting upright in chair and eating breakfast HEENT: pupils equal round and reactive to light, vision grossly intact, oropharynx clear and non-erythematous  Neck: supple Lungs: clear to ascultation bilaterally, normal work of respiration, no wheezes, rales, ronchi; sternum and L side of chest TTP Heart: regular rate and rhythm,  3/6 systolic ejection murmur Abdomen: soft, non-tender, non-distended, normal bowel sounds Back: there is tenderness over the midline of lower back and also over the paraspinal muscle areas on both sides; there is some skin thickening and hyperpigmentation to lower back Extremities: warm b/l, no BLE edema Neurologic: alert & oriented X3, cranial nerves II-XII grossly intact, strength grossly intact, sensation intact to light touch  Lab Results: Basic Metabolic Panel:  Recent Labs Lab 03/22/13 0350 03/22/13 1357  NA 129* 134*  K 4.6 4.5  CL 94* 97  CO2 18* 16*  GLUCOSE 155*  155*  BUN 30* 28*  CREATININE 2.01* 1.96*  CALCIUM 8.0* 8.7  PHOS  --  3.2   Liver Function Tests:  Recent Labs Lab 03/20/13 0822 03/22/13 1357  AST 12  --   ALT 11  --   ALKPHOS 252*  --   BILITOT 0.3  --   PROT 8.4*  --   ALBUMIN 2.8* 2.2*   CBC:  Recent Labs Lab 03/20/13 0822  WBC 6.3  HGB 8.6*  HCT 28.9*  MCV 73.9*  PLT 370   Cardiac Enzymes:  Recent Labs Lab 03/20/13 1228 03/20/13 1950 03/21/13 0347  TROPONINI <0.30 <0.30 <0.30   BNP:  Recent Labs Lab 03/20/13 0822  PROBNP 4252.0*   D-Dimer:  Recent Labs Lab 03/20/13 1118  DDIMER 2.01*   CBG:  Recent Labs Lab 03/21/13 2128 03/22/13 0736 03/22/13 1347 03/22/13 1652 03/22/13 2138 03/23/13 0746  GLUCAP 194* 132* 155* 129* 189* 174*   Hemoglobin A1C:  Recent Labs Lab 03/20/13 1228  HGBA1C 7.8*   Thyroid Function Tests:  Recent Labs Lab 03/20/13 1118  TSH 15.258*  FREET4 0.93   Coagulation:  Recent Labs Lab 03/20/13 1118  LABPROT 14.4  INR 1.14   Micro Results: Recent Results (from the past 240 hour(s))  CULTURE, BLOOD (ROUTINE X 2)     Status: None   Collection Time    03/21/13  9:40 AM      Result Value  Range Status   Specimen Description BLOOD RIGHT HAND   Final   Special Requests BOTTLES DRAWN AEROBIC AND ANAEROBIC 10CC   Final   Culture  Setup Time     Final   Value: 03/21/2013 16:16     Performed at [facility]   Culture     Final   Value:        BLOOD CULTURE RECEIVED NO GROWTH TO DATE CULTURE WILL BE HELD FOR 5 DAYS BEFORE ISSUING A FINAL NEGATIVE REPORT     Performed at [facility]   Report Status PENDING   Incomplete  CULTURE, BLOOD (ROUTINE X 2)     Status: None   Collection Time    03/21/13  9:50 AM      Result Value Range Status   Specimen Description BLOOD RIGHT HAND   Final   Special Requests BOTTLES DRAWN AEROBIC ONLY 5CC   Final   Culture  Setup Time     Final   Value: 03/21/2013 16:16     Performed at [facility]    Culture     Final   Value:        BLOOD CULTURE RECEIVED NO GROWTH TO DATE CULTURE WILL BE HELD FOR 5 DAYS BEFORE ISSUING A FINAL NEGATIVE REPORT     Performed at [facility]   Report Status PENDING   Incomplete   Studies/Results: US Renal  03/22/2013   CLINICAL DATA:  Acute kidney injury. Patient with bilateral renal transplant.  EXAM: RENAL/URINARY TRACT ULTRASOUND COMPLETE  COMPARISON:  CT, 02/28/2013  FINDINGS: Right Kidney:  Length: 7.4 cm. Kidney is echogenic. 15 mm cyst arises from the upper pole. There are probable additional tiny cysts. No hydronephrosis.  Left Kidney:  Native left kidney not visualized.  Transplant kidney: In the right lower quadrant to right upper pelvis, a transplant kidney is visualized. Is normal in overall size measuring 10.7 cm. It has normal parenchymal echogenicity. Small cyst arises from its lower pole measuring 1 cm. No other renal masses. No hydronephrosis.  Bladder:  Appears normal for degree of bladder distention.  IMPRESSION: 1. Transplant kidney is normal in appearance within a small lower pole cyst measuring 1 cm. 2. Atrophic native kidneys. Only the right was visualized. Is echogenic with at least 1 discrete small cysts. No hydronephrosis.   Electronically Signed   By: Lajean Manes M.D.   On: 03/22/2013 15:50   Nm Myocar Multi W/spect W/wall Motion / Ef  03/22/2013   CLINICAL DATA:  63yo AA man with PMH of HTN, DM2, CAD with stents, CAD, PVD s/p fem pop, prostate cancer treated with radiation, hx of thyroid cancer, h/o ESRD and kidney transplantation who presents for chest pain and back pain  EXAM: MYOCARDIAL IMAGING WITH SPECT (REST)  TECHNIQUE: Standard myocardial SPECT imaging was performed after resting intravenous injection of Tc-76m sestamibi. Quantitative gated imaging was also performed to evaluate left ventricular wall motion, and estimate left ventricular ejection fraction. As the patient's heart rate was 129 bpm when he presented for his  "stress" images, no additional administration of Lexiscan was performed.  COMPARISON:  None.  FINDINGS: The Image quality is good. There is mild interference from visceral racer uptake. The left ventricle is moderately to severely dilated. There is a moderate in size, moderate-to-severe in intensity inferior and inferolateral perfusion abnormality seen on both rest and "stress" images, possibly worse on the "stress" images. There is global hypokinesis of the left ventricle, with severe basal  and mid inferior wall hypokinesis. EF 34%. The severity of LV dysfunction is out of proportion to the perfusion abnormality.  IMPRESSION: Interpretation of the study is severely limited by absence of a clear increase in coronary flow during acquisition of the "stress" images  There is evidence of severe ischemia at rest and/or scar in the right coronary artery distribution. This appears to be at least partly reversible.  The degree of LV dysfunction suggests multivessel CAD versus a component of superimposed nonischemic cardiomyopathy.  No previous studies are available for comparison   Electronically Signed   By: Sanda Klein   On: 03/22/2013 19:29   2D echo 03/22/2012- Study Conclusions  - Left ventricle: The cavity size was mildly dilated. Wall thickness was normal. Systolic function was mildly reduced. The estimated ejection fraction was in the range of 45% to 50%. There is hypokinesis of the lateral myocardium. Doppler parameters are consistent with restrictive physiology, indicative of decreased left ventricular diastolic compliance and/or increased left atrial pressure. Doppler parameters are consistent with high ventricular filling pressure. - Aortic valve: Valve mobility was restricted. There was mild to moderate stenosis. Trivial regurgitation. Valve area: 1.56cm^2(VTI). Valve area: 1.47cm^2 (Vmax). - Mitral valve: Mild regurgitation. - Left atrium: The atrium was mildly dilated. - Pulmonary  arteries: Systolic pressure was mildly increased. PA peak pressure: 49mm Hg (S).  Medications: I have reviewed the patient's current medications. Scheduled Meds: . aspirin  81 mg Oral Daily  . atenolol  50 mg Oral Daily  . atorvastatin  80 mg Oral q1800  . clopidogrel  75 mg Oral Q breakfast  . diltiazem  60 mg Oral Q8H  . feeding supplement (ENSURE COMPLETE)  237 mL Oral TID BM  . heparin  5,000 Units Subcutaneous Q8H  . insulin aspart  0-9 Units Subcutaneous TID WC  . insulin glargine  24 Units Subcutaneous Daily  . levothyroxine  150 mcg Oral Daily  . methocarbamol  500 mg Oral BID  . mycophenolate  720 mg Oral BID  . pantoprazole  40 mg Oral Daily  . regadenoson  0.4 mg Intravenous Once  . sirolimus  4 mg Oral Daily  . sodium chloride  3 mL Intravenous Q12H   Continuous Infusions: . dextrose 5 % and 0.9% NaCl Stopped (03/23/13 0658)   PRN Meds:.sodium chloride, morphine injection, nitroGLYCERIN, oxyCODONE, oxyCODONE-acetaminophen, sodium chloride  Assessment/Plan:  # Chest pain with hx of CAD and current atrial tachycardia: Patient has atypical chest pain and has tenderness to palpation to his sternum. ACS has been ruled out. 2D echo showed EF 45-50% with some hypokinesis to lateral myocardium and diastolic dysfunction. Lexiscan myoview was limited study that showed ischemia at rest vs scar in RCA distribution (likely from patient's prior MI). EP was consulted for patient's tachycardia. They started diltiazem 60mg  TID and noted that ablation and or multaq would be options if diltiazem does not rate control patient. Patient's BP is tolerating the addition of diltiazem and his HR seems better controlled this morning. On telemetry patient had 2 short episodes of SVT as well as a 5 beat run of V tach. - Nitroglycerin, Morphine, aspirin, lipitor, atenolol, Plavix, diltiazem -monitor on telemetry -appreciate cardiology recs -d/c IVF  # Metastatic Prostate cancer with back pain:  Patient does have metastases to L and T spine per MRI done a few weeks ago (despite the fact that patient had a negative bone scan done 03/03/2013). This is most likely the cause for his 2 month hx of back pain.  No red flag s/s. Per Dr. Diona Fanti ([phone]), rest of the work up can be done as outpatient. -Pain well controlled on percocet   #Increased Anion Gap Metabolic acidosis: ABG done yesterday is c/w increased AG metabolic acidosis. Perhaps a component of CKD combined with starvation ketoacidosis given the gab decreased from 21 yesterday after patient received D5NS at 75cc/hr overnight in addition to the fact that his albumin is very low. -d/c IVF so as to not fluid overload -encourage eating  # Fever: T max overnight was 99. Continue to trend temperature. No signs of infection evident as of now. 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 No concern for kidney rejection at this time. Perhaps contributor to his increased AG. Renal U/S done yesterday showed some small cysts, but no signs of obstruction; healthy appearing transplant kidney. -continue home sirolimus and mycophenolate -mycophenolate and sirolimus levels pending -follow Cr  # HTN: bp is stable. -continue home meds: Amlodipine 10 mg daily and atenolol 50 mg daily -cardiology added diltiazem yesterday  # DM-II: A1c 7.8 on admission. On Lantus 24 U daily at home. CBGs controlled, 129-189. -will continue lantus to 24U qHS -SSI   # Hypothyroidism: TSH 15.2, free T4 .93 (wnl) on admission. He is s/p of papillary thyroid cancer with removal. On Synthroid 150 mcg daily at home. This will likely need to be increased. Will need PCP follow up. -will continue home medications for now   # DVT px: Heparin sq  Dispo: Disposition is deferred at this time, awaiting improvement of current medical problems.  Anticipated discharge in approximately  1 day(s).   The patient does have a current PCP (Sueanne Margarita, MD) and does not need an [facility] hospital follow-up appointment after discharge.  The patient does not have transportation limitations that hinder transportation to clinic appointments.  .Services Needed at time of discharge: Y = Yes, Blank = No PT:   OT:   RN:   Equipment:   Other:     LOS: 3 days   Rebecca Eaton, MD 03/23/2013, 9:10 AM

## 2013-03-23 NOTE — Progress Notes (Signed)
  Date: 03/23/2013  Patient name: Joshua [name]  Medical record number: [id number]  Date of birth: [birth year]   This patient has been seen and the plan of care was discussed with the house staff. Please see their note for complete details. I concur with their findings with the following additions/corrections:  Have reviewed Cardiology notes.  Patient is being treated for angina with IMDUR in the hopes of avoiding a catheterization.  Crackles on exam likely due to fluids patient received for mild increase in Cr and AGMA.  Most likely cause of this AG is starvation ketoacidosis as patient has not been feeling well recently and not eating much.  Bicarb improved with D5NS overnight.  These fluids have been stopped.  Bicarb has normalized and Na is close to normal.  Repeat CXR is pending.  On exam, he does have reproducible chest pain over the sternum, ? If bony involvement there given back pain and concern for metastatic disease from prostate cancer.  He also notes to me that his chest pain is similar to previous angina type pain.  For atach he has been started on Diltiazem.    [name], MD 03/23/2013, 2:07 PM

## 2013-03-23 NOTE — Progress Notes (Signed)
Patient Name: Joshua [name] Date of Encounter: 03/23/2013   Principal Problem:   Unstable angina Active Problems:   Atrial tachycardia   Occlusion and stenosis of carotid artery without mention of cerebral infarction   Coronary artery disease   Hypertension   Protein-calorie malnutrition, severe   Chronic diastolic CHF (congestive heart failure)   Dyslipidemia, goal LDL below 70   Hypothyroidism   Back pain   Prostate cancer    SUBJECTIVE  He continues to have intermittent chest discomfort, especially when he is up in room - ambulating/getting washed up this morning.  Discomfort is pressure-like, similar to prior angina but not as severe.  He has received sl ntg for this.  Ss typically last about 10 mins and get better with rest.  Nuc study yesterday was abnl but reliability of study is limited by lack of clear increase in coronary flow during stress imaging.  He has had less atach overnight.  CURRENT MEDS . aspirin  81 mg Oral Daily  . atenolol  50 mg Oral Daily  . atorvastatin  80 mg Oral q1800  . clopidogrel  75 mg Oral Q breakfast  . diltiazem  60 mg Oral Q8H  . feeding supplement (ENSURE COMPLETE)  237 mL Oral TID BM  . heparin  5,000 Units Subcutaneous Q8H  . insulin aspart  0-9 Units Subcutaneous TID WC  . insulin glargine  24 Units Subcutaneous Daily  . levothyroxine  150 mcg Oral Daily  . methocarbamol  500 mg Oral BID  . mycophenolate  720 mg Oral BID  . pantoprazole  40 mg Oral Daily  . regadenoson  0.4 mg Intravenous Once  . sirolimus  4 mg Oral Daily  . sodium chloride  3 mL Intravenous Q12H    OBJECTIVE  Filed Vitals:   03/22/13 1324 03/22/13 2139 03/23/13 0249 03/23/13 0537  BP: 156/52 127/59 133/59 142/61  Pulse: 52 73 79 74  Temp: 98.3 F (36.8 C) 99 F (37.2 C) 99 F (37.2 C) 99 F (37.2 C)  TempSrc: Oral Oral Oral Oral  Resp: 20 18 18 18   Weight:    171 lb 0.8 oz (77.589 kg)  SpO2: 98% 95% 99% 100%    Intake/Output Summary (Last 24  hours) at 03/23/13 1107 Last data filed at 03/23/13 0539  Gross per 24 hour  Intake      0 ml  Output    625 ml  Net   -625 ml   Filed Weights   03/21/13 0513 03/22/13 0514 03/23/13 0537  Weight: 171 lb 14.4 oz (77.973 kg) 171 lb 6.7 oz (77.755 kg) 171 lb 0.8 oz (77.589 kg)    PHYSICAL EXAM  General: Pleasant, NAD. Neuro: Alert and oriented X 3. Moves all extremities spontaneously. Psych: Normal affect. HEENT:  Normal  Neck: Supple without bruits.  JVP ~ 12cm. Lungs:  Resp regular and unlabored, bibasilar crackles with faint exp wheezing. Heart: RRR no s3, s4, or murmurs. Abdomen: Soft, protuberant, mild luq tenderness, BS + x 4.  Extremities: No clubbing, cyanosis or edema. DP/PT/Radials 1+ and equal bilaterally.  Accessory Clinical Findings  CBC Lab Results  Component Value Date   WBC 6.3 03/20/2013   HGB 8.6* 03/20/2013   HCT 28.9* 03/20/2013   MCV 73.9* 03/20/2013   PLT 370 06/25/6220    Basic Metabolic Panel  Recent Labs  03/22/13 1357 03/23/13 0850  NA 134* 135*  K 4.5 4.5  CL 97 98  CO2 16* 20  GLUCOSE 155* 171*  BUN 28* 29*  CREATININE 1.96* 1.96*  CALCIUM 8.7 8.4  PHOS 3.2  --    Liver Function Tests  Recent Labs  03/22/13 1357  ALBUMIN 2.2*   Cardiac Enzymes  Recent Labs  03/20/13 1228 03/20/13 1950 03/21/13 0347  TROPONINI <0.30 <0.30 <0.30   D-Dimer  Recent Labs  03/20/13 1118  DDIMER 2.01*   Hemoglobin A1C  Recent Labs  03/20/13 1228  HGBA1C 7.8*   Thyroid Function Tests  Recent Labs  03/20/13 1118  TSH 15.258*   TELE  Rsr, less atrial tach than previously noted.  5 beat run of nsvt.  Radiology/Studies  Nm Myocar Multi W/spect W/wall Motion / Ef  03/22/2013   CLINICAL DATA:  63yo AA man with PMH of HTN, DM2, CAD with stents, CAD, PVD s/p fem pop, prostate cancer treated with radiation, hx of thyroid cancer, h/o ESRD and kidney transplantation who presents for chest pain and back pain  EXAM: MYOCARDIAL IMAGING WITH  SPECT (REST)  IMPRESSION: Interpretation of the study is severely limited by absence of a clear increase in coronary flow during acquisition of the "stress" images  There is evidence of severe ischemia at rest and/or scar in the right coronary artery distribution. This appears to be at least partly reversible.  The degree of LV dysfunction suggests multivessel CAD versus a component of superimposed nonischemic cardiomyopathy.  No previous studies are available for comparison   Electronically Signed   By: Sanda Klein   On: 03/22/2013 19:29    ASSESSMENT AND PLAN  1.  [place]/CAD:  Pt continues to have intermittent chest discomfort when he is up in his room.  He had c/pressure this AM while washing up.  This was relieved with rest.  He is currently pain free.  CE have been negative and nuclear study yesterday suggested inf isch vs scar.  Given prior h/o CTO of the RCA, suspect that this is most likely to be scar.  Ideally would prefer to avoid cath given renal dzs/transplant.  Add imdur to current regimen of bb, asa, plavix, statin, to see if we can manage him medically.  If he cont to have cp however, he will need cath.  2.  Atrial tachycardia:  Less atach following addition of dilt.  Consolidate to 180mg  daily.  Follow.  3.  Chronic diast CHF:  He has bibasilar crackles on exam, mild elevation of neck veins.  That said, weight has been stable and he remains a net negative of 1875 for admission.  HR/BP improved.  Repeat cxr.  4.  CKD w/ h/o renal tx:  Cr stable @ 1.96.  5.  HTN:  Stable.  6.  DMII:  Per IM.  7.  Fever:  Cont to have a low grade fever of 99.  Says he feels like he has a cold.  Exp wheezing on exam.  F/u cxr.  8.  Back Pain with metastatic prostate CA:  abnl MRI prior to admission.  Outpt f/u per primary team.  Signed, Murray Hodgkins NP   The patient was seen, examined and discussed with Tarri Fuller, PA-C and I agree with the above.   1. CAD - Nuclear  Stress test showed  inferior scar with mild peri-infarct ischemia, the plan is to intensify medical therapy as he is post kidney tx and GFR low. Started on Imdur, no more episodes of chest pain since this morning. 2. Atrial tachycardia -started on Cardizem by EP, 2 more episodes but significantly shorter- few minutes, we  will follow 3. CHF - chronic diastolic - fluid overloaded on exam, we would give Lasix 40 mg iv x 1 today and follow in the am   Ena Dawley, H 03/23/2013

## 2013-03-24 ENCOUNTER — Ambulatory Visit (HOSPITAL_COMMUNITY)
Admission: RE | Admit: 2013-03-24 | Discharge: 2013-03-24 | Disposition: A | Discharge status: Home / Self Care | Payer: Medicare Other | Source: Ambulatory Visit | Attending: Urology | Admitting: Urology

## 2013-03-24 DIAGNOSIS — E875 Hyperkalemia: Secondary | ICD-10-CM

## 2013-03-24 LAB — BASIC METABOLIC PANEL
BUN: 31 mg/dL — AB (ref 6–23)
CO2: 19 mEq/L (ref 19–32)
CREATININE: 2.23 mg/dL — AB (ref 0.50–1.35)
Calcium: 8 mg/dL — ABNORMAL LOW (ref 8.4–10.5)
Chloride: 100 mEq/L (ref 96–112)
GFR calc Af Amer: 35 mL/min — ABNORMAL LOW (ref >90)
GFR, EST NON AFRICAN AMERICAN: 30 mL/min — AB (ref >90)
GLUCOSE: 119 mg/dL — AB (ref 70–99)
Potassium: 4.7 mEq/L (ref 3.7–5.3)
Sodium: 136 mEq/L — ABNORMAL LOW (ref 137–147)

## 2013-03-24 LAB — LACTIC ACID, PLASMA: Lactic Acid, Venous: 1.1 mmol/L (ref 0.5–2.2)

## 2013-03-24 LAB — GLUCOSE, CAPILLARY
GLUCOSE-CAPILLARY: 221 mg/dL — AB (ref 70–99)
Glucose-Capillary: 100 mg/dL — ABNORMAL HIGH (ref 70–99)
Glucose-Capillary: 202 mg/dL — ABNORMAL HIGH (ref 70–99)
Glucose-Capillary: 125 mg/dL — ABNORMAL HIGH (ref 70–99)

## 2013-03-24 LAB — SALICYLATE LEVEL: Salicylate Lvl: <2 mg/dL — ABNORMAL LOW (ref 2.8–20.0)

## 2013-03-24 LAB — SIROLIMUS LEVEL: Sirolimus (Rapamycin): 11 mcg/L (ref 3.0–18.0)

## 2013-03-24 MED ORDER — FUROSEMIDE 10 MG/ML IJ SOLN
40.0000 mg | Freq: Two times a day (BID) | INTRAMUSCULAR | Status: DC
  Administered 2013-03-24 – 2013-03-25 (×3): 40 mg via INTRAVENOUS
  Filled 2013-03-24 (×5): qty 4

## 2013-03-24 NOTE — Progress Notes (Signed)
Internal Medicine Attending  Date: 03/24/2013  Patient name: Joshua [name] Medical record number: [id number] Date of birth: 1950-08-01 Age: 63 y.o. Gender: male  I saw and evaluated the patient and discussed his care on AM rounds with house staff. I reviewed the resident's note by Dr. Mechele Claude and I agree with the resident's findings and plans as documented in her note.

## 2013-03-24 NOTE — Progress Notes (Signed)
Patient ambulated in hall with walker, gait was slow patient only complained of mild back pain heart rate was in the 70's on the hallway monitor.

## 2013-03-24 NOTE — Progress Notes (Signed)
Subjective: Patient denies having any more episodes of chest pain since yesterday night, no CP overnight. He did have approx 4 episodes of asymptomatic SVT between 3 and 6AM this morning. His back pain is tolerable this morning. No other complaints. Cardiology saw patient yesterday and recommended starting IMDUR, continuing diltiazem, repeating CXR, and giving lasix 40mg  IV x 1 dose. Pt diuresed well last night after receiving the lasix, net I/O is negative 206mL.  Objective: Vital signs in last 24 hours: Filed Vitals:   03/23/13 1412 03/23/13 1425 03/23/13 2051 03/24/13 0632  BP:  130/70 146/57 124/94  Pulse:  73 80 76  Temp:  98.1 F (36.7 C) 99.5 F (37.5 C) 99.3 F (37.4 C)  TempSrc:  Oral Oral Oral  Resp:  18 20 20   Weight: 179 lb 11.2 oz (81.511 kg)   178 lb (80.74 kg)  SpO2:  92% 90% 96%   Weight change: 8 lb 10.4 oz (3.922 kg)  Intake/Output Summary (Last 24 hours) at 03/24/13 0846 Last data filed at 03/23/13 2210  Gross per 24 hour  Intake      0 ml  Output    200 ml  Net   -200 ml   Physical Exam General: alert, cooperative, and in no apparent distress HEENT: pupils equal round and reactive to light, vision grossly intact, oropharynx clear and non-erythematous  Neck: supple Lungs: clear to ascultation bilaterally, normal work of respiration, no wheezes, rales, ronchi; sternum and L side of chest TTP Heart: regular rate and rhythm, 3/6 systolic ejection murmur Abdomen: soft, non-tender, non-distended, normal bowel sounds Back: there is tenderness over the midline of lower back and also over the paraspinal muscle areas on both sides; there is some skin thickening and hyperpigmentation to lower back Extremities: warm b/l, no BLE edema Neurologic: alert & oriented X3, cranial nerves II-XII grossly intact, strength grossly intact, sensation intact to light touch  Lab Results: Basic Metabolic Panel:  Recent Labs Lab 03/22/13 1357 03/23/13 0850 03/24/13 0503  NA  134* 135* 136*  K 4.5 4.5 4.7  CL 97 98 100  CO2 16* 20 19  GLUCOSE 155* 171* 119*  BUN 28* 29* 31*  CREATININE 1.96* 1.96* 2.23*  CALCIUM 8.7 8.4 8.0*  PHOS 3.2  --   --    Liver Function Tests:  Recent Labs Lab 03/20/13 0822 03/22/13 1357  AST 12  --   ALT 11  --   ALKPHOS 252*  --   BILITOT 0.3  --   PROT 8.4*  --   ALBUMIN 2.8* 2.2*   CBC:  Recent Labs Lab 03/20/13 0822  WBC 6.3  HGB 8.6*  HCT 28.9*  MCV 73.9*  PLT 370   Cardiac Enzymes:  Recent Labs Lab 03/20/13 1228 03/20/13 1950 03/21/13 0347  TROPONINI <0.30 <0.30 <0.30   BNP:  Recent Labs Lab 03/20/13 0822  PROBNP 4252.0*   D-Dimer:  Recent Labs Lab 03/20/13 1118  DDIMER 2.01*   CBG:  Recent Labs Lab 03/22/13 2138 03/23/13 0746 03/23/13 1147 03/23/13 1648 03/23/13 2054 03/24/13 0739  GLUCAP 189* 174* 134* 188* 137* 100*   Hemoglobin A1C:  Recent Labs Lab 03/20/13 1228  HGBA1C 7.8*   Thyroid Function Tests:  Recent Labs Lab 03/20/13 1118  TSH 15.258*  FREET4 0.93   Coagulation:  Recent Labs Lab 03/20/13 1118  LABPROT 14.4  INR 1.14   Micro Results: Recent Results (from the past 240 hour(s))  CULTURE, BLOOD (ROUTINE X 2)  Status: None   Collection Time    03/21/13  9:40 AM      Result Value Range Status   Specimen Description BLOOD RIGHT HAND   Final   Special Requests BOTTLES DRAWN AEROBIC AND ANAEROBIC 10CC   Final   Culture  Setup Time     Final   Value: 03/21/2013 16:16     Performed at [facility]   Culture     Final   Value:        BLOOD CULTURE RECEIVED NO GROWTH TO DATE CULTURE WILL BE HELD FOR 5 DAYS BEFORE ISSUING A FINAL NEGATIVE REPORT     Performed at [facility]   Report Status PENDING   Incomplete  CULTURE, BLOOD (ROUTINE X 2)     Status: None   Collection Time    03/21/13  9:50 AM      Result Value Range Status   Specimen Description BLOOD RIGHT HAND   Final   Special Requests BOTTLES DRAWN AEROBIC ONLY 5CC    Final   Culture  Setup Time     Final   Value: 03/21/2013 16:16     Performed at [facility]   Culture     Final   Value:        BLOOD CULTURE RECEIVED NO GROWTH TO DATE CULTURE WILL BE HELD FOR 5 DAYS BEFORE ISSUING A FINAL NEGATIVE REPORT     Performed at [facility]   Report Status PENDING   Incomplete   Studies/Results: Dg Chest 2 View  03/23/2013   CLINICAL DATA:  Chest pain with history of congestive heart failure  EXAM: CHEST  2 VIEW  COMPARISON:  03/20/2013  FINDINGS: Heart size upper normal. Mild vascular congestion has developed when compared to the prior study. There is bilateral perihilar peribronchial cuffing with indistinct bilateral perihilar vessels and mild hazy opacity in the perihilar regions bilaterally. There are no pleural effusions. Surgical clips in the thyroid bed again identified.  IMPRESSION: Findings suggest development of congestive heart failure with mild pulmonary edema.   Electronically Signed   By: Skipper Cliche M.D.   On: 03/23/2013 16:22   US Renal  03/22/2013   CLINICAL DATA:  Acute kidney injury. Patient with bilateral renal transplant.  EXAM: RENAL/URINARY TRACT ULTRASOUND COMPLETE  COMPARISON:  CT, 02/28/2013  FINDINGS: Right Kidney:  Length: 7.4 cm. Kidney is echogenic. 15 mm cyst arises from the upper pole. There are probable additional tiny cysts. No hydronephrosis.  Left Kidney:  Native left kidney not visualized.  Transplant kidney: In the right lower quadrant to right upper pelvis, a transplant kidney is visualized. Is normal in overall size measuring 10.7 cm. It has normal parenchymal echogenicity. Small cyst arises from its lower pole measuring 1 cm. No other renal masses. No hydronephrosis.  Bladder:  Appears normal for degree of bladder distention.  IMPRESSION: 1. Transplant kidney is normal in appearance within a small lower pole cyst measuring 1 cm. 2. Atrophic native kidneys. Only the right was visualized. Is echogenic with at  least 1 discrete small cysts. No hydronephrosis.   Electronically Signed   By: Lajean Manes M.D.   On: 03/22/2013 15:50   Nm Myocar Multi W/spect W/wall Motion / Ef  03/22/2013   CLINICAL DATA:  63yo AA man with PMH of HTN, DM2, CAD with stents, CAD, PVD s/p fem pop, prostate cancer treated with radiation, hx of thyroid cancer, h/o ESRD and kidney transplantation who presents for chest pain  and back pain  EXAM: MYOCARDIAL IMAGING WITH SPECT (REST)  TECHNIQUE: Standard myocardial SPECT imaging was performed after resting intravenous injection of Tc-17m sestamibi. Quantitative gated imaging was also performed to evaluate left ventricular wall motion, and estimate left ventricular ejection fraction. As the patient's heart rate was 129 bpm when he presented for his "stress" images, no additional administration of Lexiscan was performed.  COMPARISON:  None.  FINDINGS: The Image quality is good. There is mild interference from visceral racer uptake. The left ventricle is moderately to severely dilated. There is a moderate in size, moderate-to-severe in intensity inferior and inferolateral perfusion abnormality seen on both rest and "stress" images, possibly worse on the "stress" images. There is global hypokinesis of the left ventricle, with severe basal and mid inferior wall hypokinesis. EF 34%. The severity of LV dysfunction is out of proportion to the perfusion abnormality.  IMPRESSION: Interpretation of the study is severely limited by absence of a clear increase in coronary flow during acquisition of the "stress" images  There is evidence of severe ischemia at rest and/or scar in the right coronary artery distribution. This appears to be at least partly reversible.  The degree of LV dysfunction suggests multivessel CAD versus a component of superimposed nonischemic cardiomyopathy.  No previous studies are available for comparison   Electronically Signed   By: Sanda Klein   On: 03/22/2013 19:29   2D echo  03/22/2012- Study Conclusions  - Left ventricle: The cavity size was mildly dilated. Wall thickness was normal. Systolic function was mildly reduced. The estimated ejection fraction was in the range of 45% to 50%. There is hypokinesis of the lateral myocardium. Doppler parameters are consistent with restrictive physiology, indicative of decreased left ventricular diastolic compliance and/or increased left atrial pressure. Doppler parameters are consistent with high ventricular filling pressure. - Aortic valve: Valve mobility was restricted. There was mild to moderate stenosis. Trivial regurgitation. Valve area: 1.56cm^2(VTI). Valve area: 1.47cm^2 (Vmax). - Mitral valve: Mild regurgitation. - Left atrium: The atrium was mildly dilated. - Pulmonary arteries: Systolic pressure was mildly increased. PA peak pressure: 29mm Hg (S).  Medications: I have reviewed the patient's current medications. Scheduled Meds: . aspirin  81 mg Oral Daily  . atenolol  50 mg Oral Daily  . atorvastatin  80 mg Oral q1800  . clopidogrel  75 mg Oral Q breakfast  . diltiazem  180 mg Oral Daily  . feeding supplement (ENSURE COMPLETE)  237 mL Oral TID BM  . heparin  5,000 Units Subcutaneous Q8H  . insulin aspart  0-9 Units Subcutaneous TID WC  . insulin glargine  24 Units Subcutaneous Daily  . isosorbide mononitrate  30 mg Oral Daily  . levothyroxine  150 mcg Oral Daily  . methocarbamol  500 mg Oral BID  . mycophenolate  720 mg Oral BID  . pantoprazole  40 mg Oral Daily  . regadenoson  0.4 mg Intravenous Once  . sirolimus  4 mg Oral Daily  . sodium chloride  3 mL Intravenous Q12H   Continuous Infusions:   PRN Meds:.sodium chloride, morphine injection, nitroGLYCERIN, oxyCODONE, oxyCODONE-acetaminophen, sodium chloride  Assessment/Plan:  # Chest pain with hx of CAD and current atrial tachycardia: Patient did not have any chest pain in the last 24 hours. He continues to have intermittent very short  episodes of asymptomatic atrial tachycardia, HR up to 120s-130s. Cardiology continues to follow and had adjusted diltiazem yesterday to 180mg  once daily. They had ordered CXR yesterday which showed mild pulmonary edema. He  diuresed well s/p lasix 40mg  IV once yesterday. Patient was started on IMDUR 30mg  daily yesterday as well. -Continue cardiac medications: Nitroglycerin, aspirin, lipitor, atenolol, Plavix, diltiazem, IMDUR -monitor on telemetry -appreciate cardiology recs  #Increased Anion Gap Metabolic acidosis: AG 17 today. Unclear etiology. Starvation ketoacidosis vs. Medication side effect vs lactic acidosis.  -encourage eating, continue ensure TID as per nutrition  -check salicylate level and lactic acid level -check if any of the medications he is on can cause increased AG metabolic acidosis  # Fever: Unclear etiology. T max overnight was 99.5. Continue to trend temperature. No signs of infection. CXR yesterday showed pulm edema, no infiltrate.  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 Likely lasix effect. Will continue to follow.  -continue home sirolimus and mycophenolate -mycophenolate and sirolimus levels pending -follow Cr  # Metastatic Prostate cancer with back pain: Patient does have metastases to L and T spine per MRI done a few weeks ago (despite the fact that patient had a negative bone scan done 03/03/2013). This is most likely the cause for his 2 month hx of back pain. No red flag s/s. Per Dr. Diona Fanti ([phone]), rest of the work up can be done as outpatient. -Pain well controlled on percocet   # HTN: bp is stable. -continue home meds: Amlodipine 10 mg daily and atenolol 50 mg daily -continue meds started by cardiology: IMDUR 30mg  daily and diltiazem 180mg  daily  # DM-II: A1c 7.8 on admission. On Lantus 24 U daily at home. CBGs controlled, 137-188. -will continue lantus to 24U qHS -SSI   # Hypothyroidism:  TSH 15.2, free T4 .93 (wnl) on admission. He is s/p of papillary thyroid cancer with removal. On Synthroid 150 mcg daily at home. This will likely need to be increased as outpatient. -will continue home medications for now   # DVT px: Heparin sq  Dispo: Disposition is deferred at this time, awaiting improvement of current medical problems.  Anticipated discharge in approximately 1 day(s).   The patient does have a current PCP (Sueanne Margarita, MD) and does not need an [facility] hospital follow-up appointment after discharge.  The patient does not have transportation limitations that hinder transportation to clinic appointments.  .Services Needed at time of discharge: Y = Yes, Blank = No PT:   OT:   RN:   Equipment:   Other:     LOS: 4 days   Rebecca Eaton, MD 03/24/2013, 8:46 AM

## 2013-03-24 NOTE — Progress Notes (Signed)
Utilization review completed.  

## 2013-03-24 NOTE — Progress Notes (Signed)
Patient Name: Joshua [name] Date of Encounter: 03/24/2013   Principal Problem:   Unstable angina Active Problems:   Atrial tachycardia   Occlusion and stenosis of carotid artery without mention of cerebral infarction   Coronary artery disease   Hypertension   Protein-calorie malnutrition, severe   Chronic diastolic CHF (congestive heart failure)   Dyslipidemia, goal LDL below 70   Hypothyroidism   Back pain   Prostate cancer    SUBJECTIVE  Significantly improved SOB, no more CP.  CURRENT MEDS . aspirin  81 mg Oral Daily  . atenolol  50 mg Oral Daily  . atorvastatin  80 mg Oral q1800  . clopidogrel  75 mg Oral Q breakfast  . diltiazem  180 mg Oral Daily  . feeding supplement (ENSURE COMPLETE)  237 mL Oral TID BM  . heparin  5,000 Units Subcutaneous Q8H  . insulin aspart  0-9 Units Subcutaneous TID WC  . insulin glargine  24 Units Subcutaneous Daily  . isosorbide mononitrate  30 mg Oral Daily  . levothyroxine  150 mcg Oral Daily  . methocarbamol  500 mg Oral BID  . mycophenolate  720 mg Oral BID  . pantoprazole  40 mg Oral Daily  . regadenoson  0.4 mg Intravenous Once  . sirolimus  4 mg Oral Daily  . sodium chloride  3 mL Intravenous Q12H    OBJECTIVE  Filed Vitals:   03/23/13 1425 03/23/13 2051 03/24/13 0632 03/24/13 0944  BP: 130/70 146/57 124/94 148/52  Pulse: 73 80 76 79  Temp: 98.1 F (36.7 C) 99.5 F (37.5 C) 99.3 F (37.4 C)   TempSrc: Oral Oral Oral   Resp: 18 20 20    Weight:   178 lb (80.74 kg)   SpO2: 92% 90% 96%     Intake/Output Summary (Last 24 hours) at 03/24/13 1034 Last data filed at 03/23/13 2210  Gross per 24 hour  Intake      0 ml  Output    200 ml  Net   -200 ml   Filed Weights   03/23/13 0537 03/23/13 1412 03/24/13 0632  Weight: 171 lb 0.8 oz (77.589 kg) 179 lb 11.2 oz (81.511 kg) 178 lb (80.74 kg)    PHYSICAL EXAM  General: Pleasant, NAD. Neuro: Alert and oriented X 3. Moves all extremities spontaneously. Psych:  Normal affect. HEENT:  Normal  Neck: Supple without bruits, JVD + 6 cm. Lungs:  Resp regular and unlabored, crackles B/L. Heart: RRR no s3, s4, or murmurs. Abdomen: Soft, non-tender, non-distended, BS + x 4.  Extremities: No clubbing, cyanosis or edema. DP/PT/Radials 2+ and equal bilaterally.  Accessory Clinical Findings  Basic Metabolic Panel  Recent Labs  03/22/13 1357 03/23/13 0850 03/24/13 0503  NA 134* 135* 136*  K 4.5 4.5 4.7  CL 97 98 100  CO2 16* 20 19  GLUCOSE 155* 171* 119*  BUN 28* 29* 31*  CREATININE 1.96* 1.96* 2.23*  CALCIUM 8.7 8.4 8.0*  PHOS 3.2  --   --    Liver Function Tests  Recent Labs  03/22/13 1357  ALBUMIN 2.2*   TELE: SR, episodes of atrial tachycardia with HR 130-150, lasting few minutes to almost 45 minutes  Radiology/Studies  Dg Chest 2 View 03/23/2013  IMPRESSION: Findings suggest development of congestive heart failure with mild pulmonary edema.   Electronically Signed   By: Skipper Cliche M.D.   On: 03/23/2013 16:22     ASSESSMENT AND PLAN  1. CAD, admitted with chest pain -  Nuclear Stress test showed inferior scar with mild peri-infarct ischemia, the plan is to intensify medical therapy as he is post kidney tx and GFR low. Started on Imdur, no more episodes of chest pain since yesterday   2. Atrial tachycardia -started on Cardizem by EP, 5-6 more episodes in the last 24 hours, up to 30 minutes, asymptomatic  3. CHF - acute chronic diastolic - fluid overloaded on exam, good clinical response to one dose of iv Lasix yesterda, however increasing Crea, and significant fluid overload on physical exam,  we will increase Lasix dose to 40 mg iv BID and monitor Crea closely   Ena Dawley, H 03/24/2013

## 2013-03-25 LAB — BASIC METABOLIC PANEL
BUN: 37 mg/dL — ABNORMAL HIGH (ref 6–23)
CHLORIDE: 99 meq/L (ref 96–112)
CO2: 21 meq/L (ref 19–32)
Calcium: 8.3 mg/dL — ABNORMAL LOW (ref 8.4–10.5)
Creatinine, Ser: 2.47 mg/dL — ABNORMAL HIGH (ref 0.50–1.35)
GFR calc Af Amer: 31 mL/min — ABNORMAL LOW (ref >90)
GFR calc non Af Amer: 27 mL/min — ABNORMAL LOW (ref >90)
Glucose, Bld: 84 mg/dL (ref 70–99)
POTASSIUM: 4.6 meq/L (ref 3.7–5.3)
SODIUM: 139 meq/L (ref 137–147)

## 2013-03-25 LAB — MYCOPHENOLIC ACID (CELLCEPT)
MPA GLUCURONIDE: 114 ug/mL — AB (ref 35.0–100.0)
MPA: 1.5 ug/mL (ref 1.0–3.5)

## 2013-03-25 LAB — GLUCOSE, CAPILLARY
GLUCOSE-CAPILLARY: 73 mg/dL (ref 70–99)
GLUCOSE-CAPILLARY: 150 mg/dL — AB (ref 70–99)
GLUCOSE-CAPILLARY: 110 mg/dL — AB (ref 70–99)
Glucose-Capillary: 151 mg/dL — ABNORMAL HIGH (ref 70–99)

## 2013-03-25 MED ORDER — DILTIAZEM HCL ER COATED BEADS 180 MG PO CP24
180.0000 mg | ORAL_CAPSULE | Freq: Two times a day (BID) | ORAL | Status: DC
  Administered 2013-03-25 – 2013-03-27 (×4): 180 mg via ORAL
  Filled 2013-03-25 (×6): qty 1

## 2013-03-25 MED ORDER — FUROSEMIDE 40 MG PO TABS
40.0000 mg | ORAL_TABLET | Freq: Two times a day (BID) | ORAL | Status: DC
  Filled 2013-03-25 (×2): qty 1

## 2013-03-25 NOTE — Progress Notes (Signed)
Subjective:  He doesn't feel bad and has no chest pain but is having continued episodes of atrial tachycardia.  Objective:  Vital Signs in the last 24 hours: BP 128/74  Pulse 78  Temp(Src) 99.7 F (37.6 C) (Oral)  Resp 18  Ht 5\' 11"  (1.803 m)  Wt 81.103 kg (178 lb 12.8 oz)  BMI 24.95 kg/m2  SpO2 92%  Physical Exam: Mildly obese black male in no acute distress Lungs:  Clear Cardiac:  Regular rhythm, normal S1 and S2, no S3, episodes of tachycardia noted at times. Abdomen:  Soft, nontender, no masses Extremities:  No edema present  Intake/Output from previous day: 01/16 0701 - 01/17 0700 In: 480 [P.O.:480] Out: 1900 [Urine:1900]  Weight Filed Weights   03/23/13 1412 03/24/13 0632 03/25/13 0517  Weight: 81.511 kg (179 lb 11.2 oz) 80.74 kg (178 lb) 81.103 kg (178 lb 12.8 oz)    Lab Results: Basic Metabolic Panel:  Recent Labs  03/24/13 0503 03/25/13 0500  NA 136* 139  K 4.7 4.6  CL 100 99  CO2 19 21  GLUCOSE 119* 84  BUN 31* 37*  CREATININE 2.23* 2.47*   Telemetry: Sinus rhythm with continued episodes of atrial tachycardia.  Assessment/Plan:  1. Atrial tachycardia recurrent 2. Acute diastolic heart failure clinically improved since yesterday 3. Chest pain resolved  4. Stage III chronic kidney disease  Recommendations:  I would up titrate his diltiazem as needed for the atrial tachycardia.. For the time being I will increase it to 180 mg twice daily to avoid breakthrough problems. Continue atenolol.      Kerry Hough  MD [facility]  03/25/2013, 12:26 PM

## 2013-03-25 NOTE — Progress Notes (Signed)
Subjective: Patient has not had any chest pain for the past 3 days. His back pain is still well controlled. No SOB. Patient ambulated with nurse yesterday using a walker. He felt as though his balance was fairly good, though a little unsteady given this was the first he had gotten out of bed and walked in a few days. HR remained in the 70s throughout the duration of his ambulation. T max 100.  Objective: Vital signs in last 24 hours: Filed Vitals:   03/24/13 0944 03/24/13 1330 03/24/13 2100 03/25/13 0517  BP: 148/52 122/63 151/68 128/74  Pulse: 79 74 78 78  Temp:  97.9 F (36.6 C) 100 F (37.8 C) 99.7 F (37.6 C)  TempSrc:  Oral    Resp:  19 18 18   Height:    5\' 11"  (1.803 m)  Weight:    178 lb 12.8 oz (81.103 kg)  SpO2:  95% 96% 92%   Weight change: -14.4 oz (-0.408 kg)  Intake/Output Summary (Last 24 hours) at 03/25/13 1030 Last data filed at 03/25/13 0946  Gross per 24 hour  Intake    840 ml  Output   1900 ml  Net  -1060 ml   Physical Exam General: alert, cooperative, and in no apparent distress HEENT: pupils equal round and reactive to light, vision grossly intact, oropharynx clear and non-erythematous  Neck: supple Lungs: clear to ascultation bilaterally, normal work of respiration, no wheezes, rales, ronchi; sternum and L side of chest TTP Heart: regular rate and rhythm, 3/6 systolic ejection murmur Abdomen: soft, non-tender, non-distended, normal bowel sounds Back: there is tenderness over the midline of lower back and also over the paraspinal muscle areas on both sides; there is some skin thickening and hyperpigmentation to lower back Extremities: warm b/l, no BLE edema Neurologic: alert & oriented X3, cranial nerves II-XII grossly intact, strength grossly intact, sensation intact to light touch  Lab Results: Basic Metabolic Panel:  Recent Labs Lab 03/22/13 1357  03/24/13 0503 03/25/13 0500  NA 134*  < > 136* 139  K 4.5  < > 4.7 4.6  CL 97  < > 100 99    CO2 16*  < > 19 21  GLUCOSE 155*  < > 119* 84  BUN 28*  < > 31* 37*  CREATININE 1.96*  < > 2.23* 2.47*  CALCIUM 8.7  < > 8.0* 8.3*  PHOS 3.2  --   --   --   < > = values in this interval not displayed. Liver Function Tests:  Recent Labs Lab 03/20/13 0822 03/22/13 1357  AST 12  --   ALT 11  --   ALKPHOS 252*  --   BILITOT 0.3  --   PROT 8.4*  --   ALBUMIN 2.8* 2.2*   CBC:  Recent Labs Lab 03/20/13 0822  WBC 6.3  HGB 8.6*  HCT 28.9*  MCV 73.9*  PLT 370   Cardiac Enzymes:  Recent Labs Lab 03/20/13 1228 03/20/13 1950 03/21/13 0347  TROPONINI <0.30 <0.30 <0.30   BNP:  Recent Labs Lab 03/20/13 0822  PROBNP 4252.0*   D-Dimer:  Recent Labs Lab 03/20/13 1118  DDIMER 2.01*   CBG:  Recent Labs Lab 03/23/13 2054 03/24/13 0739 03/24/13 1145 03/24/13 1627 03/24/13 2050 03/25/13 0804  GLUCAP 137* 100* 202* 221* 125* 73   Hemoglobin A1C:  Recent Labs Lab 03/20/13 1228  HGBA1C 7.8*   Thyroid Function Tests:  Recent Labs Lab 03/20/13 1118  TSH 15.258*  FREET4 0.93  Coagulation:  Recent Labs Lab 03/20/13 1118  LABPROT 14.4  INR 1.14   Micro Results: Recent Results (from the past 240 hour(s))  CULTURE, BLOOD (ROUTINE X 2)     Status: None   Collection Time    03/21/13  9:40 AM      Result Value Range Status   Specimen Description BLOOD RIGHT HAND   Final   Special Requests BOTTLES DRAWN AEROBIC AND ANAEROBIC 10CC   Final   Culture  Setup Time     Final   Value: 03/21/2013 16:16     Performed at [facility]   Culture     Final   Value:        BLOOD CULTURE RECEIVED NO GROWTH TO DATE CULTURE WILL BE HELD FOR 5 DAYS BEFORE ISSUING A FINAL NEGATIVE REPORT     Performed at [facility]   Report Status PENDING   Incomplete  CULTURE, BLOOD (ROUTINE X 2)     Status: None   Collection Time    03/21/13  9:50 AM      Result Value Range Status   Specimen Description BLOOD RIGHT HAND   Final   Special Requests BOTTLES  DRAWN AEROBIC ONLY 5CC   Final   Culture  Setup Time     Final   Value: 03/21/2013 16:16     Performed at [facility]   Culture     Final   Value:        BLOOD CULTURE RECEIVED NO GROWTH TO DATE CULTURE WILL BE HELD FOR 5 DAYS BEFORE ISSUING A FINAL NEGATIVE REPORT     Performed at [facility]   Report Status PENDING   Incomplete   Studies/Results: Dg Chest 2 View  03/23/2013   CLINICAL DATA:  Chest pain with history of congestive heart failure  EXAM: CHEST  2 VIEW  COMPARISON:  03/20/2013  FINDINGS: Heart size upper normal. Mild vascular congestion has developed when compared to the prior study. There is bilateral perihilar peribronchial cuffing with indistinct bilateral perihilar vessels and mild hazy opacity in the perihilar regions bilaterally. There are no pleural effusions. Surgical clips in the thyroid bed again identified.  IMPRESSION: Findings suggest development of congestive heart failure with mild pulmonary edema.   Electronically Signed   By: Skipper Cliche M.D.   On: 03/23/2013 16:22   2D echo 03/22/2012- Study Conclusions  - Left ventricle: The cavity size was mildly dilated. Wall thickness was normal. Systolic function was mildly reduced. The estimated ejection fraction was in the range of 45% to 50%. There is hypokinesis of the lateral myocardium. Doppler parameters are consistent with restrictive physiology, indicative of decreased left ventricular diastolic compliance and/or increased left atrial pressure. Doppler parameters are consistent with high ventricular filling pressure. - Aortic valve: Valve mobility was restricted. There was mild to moderate stenosis. Trivial regurgitation. Valve area: 1.56cm^2(VTI). Valve area: 1.47cm^2 (Vmax). - Mitral valve: Mild regurgitation. - Left atrium: The atrium was mildly dilated. - Pulmonary arteries: Systolic pressure was mildly increased. PA peak pressure: 24mm Hg (S).  Medications: I have reviewed the  patient's current medications. Scheduled Meds: . aspirin  81 mg Oral Daily  . atenolol  50 mg Oral Daily  . atorvastatin  80 mg Oral q1800  . clopidogrel  75 mg Oral Q breakfast  . diltiazem  180 mg Oral Daily  . feeding supplement (ENSURE COMPLETE)  237 mL Oral TID BM  . furosemide  40 mg Intravenous BID  .  heparin  5,000 Units Subcutaneous Q8H  . insulin aspart  0-9 Units Subcutaneous TID WC  . insulin glargine  24 Units Subcutaneous Daily  . isosorbide mononitrate  30 mg Oral Daily  . levothyroxine  150 mcg Oral Daily  . methocarbamol  500 mg Oral BID  . mycophenolate  720 mg Oral BID  . pantoprazole  40 mg Oral Daily  . regadenoson  0.4 mg Intravenous Once  . sirolimus  4 mg Oral Daily   Continuous Infusions:   PRN Meds:.sodium chloride, nitroGLYCERIN, oxyCODONE, oxyCODONE-acetaminophen, sodium chloride  Assessment/Plan:  # Chest pain with hx of CAD, dCHF and current atrial tachycardia: No more chest pain, patient is doing well and is anxious to go home. Will need to contact cardiology today to discuss dispo. He diuresed well with lasix 40mg  IV BID yesterday, I/O negative 1.4L, though his weight is stable from yesterday. Cr increased again to 2.47 today, likely from lasix therapy. -Continue cardiac medications: Nitroglycerin, aspirin, lipitor, atenolol, Plavix, diltiazem, IMDUR -monitor on telemetry -appreciate cardiology recs- will touch base today  #Increased Anion Gap Metabolic acidosis: AG 19 today. Unclear etiology. Starvation ketoacidosis vs. Medication side effect vs CKD. Sirolimus and mycophenolate can both cause acidosis, though unsure if they specifically can cause an increase in AG. Lactic acid 1.1 and salicylate level wnl. -encourage eating, continue ensure TID as per nutrition   # Fever: Unclear etiology. T max overnight was 100. Continue to trend temperature. No signs of infection, though patient is at risk for multiple opportunistic organisms given he is  immunosuppressed. May be 2/2 malignancy. 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 Likely lasix effect. Will continue to follow. Mycophenolate level 114 (high), sirolimus level 11 (within expected range). Will need to touch base with nephrology regarding mycophenolate level elevation. -continue home sirolimus and mycophenolate (may change dosing based on renal recs) -follow Cr  # Metastatic Prostate cancer with back pain: Stable. Plan as per prior notes, no changes today  # HTN: bp is stable. -continue home meds: Amlodipine 10 mg daily and atenolol 50 mg daily -continue meds started by cardiology: IMDUR 30mg  daily and diltiazem 180mg  daily  # DM-II: A1c 7.8 on admission. On Lantus 24 U daily at home. CBGs controlled, 73-221. -will continue lantus to 24U qHS -SSI    # DVT px: Heparin sq  Dispo: Disposition is deferred at this time, awaiting improvement of current medical problems.  The patient does have a current PCP (Sueanne Margarita, MD) and does not need an [facility] hospital follow-up appointment after discharge.  The patient does not have transportation limitations that hinder transportation to clinic appointments.  .Services Needed at time of discharge: Y = Yes, Blank = No PT:   OT:   RN:   Equipment:   Other:     LOS: 5 days   Rebecca Eaton, MD 03/25/2013, 10:30 AM

## 2013-03-26 LAB — BASIC METABOLIC PANEL
BUN: 39 mg/dL — ABNORMAL HIGH (ref 6–23)
CO2: 20 mEq/L (ref 19–32)
Calcium: 7.7 mg/dL — ABNORMAL LOW (ref 8.4–10.5)
Chloride: 95 mEq/L — ABNORMAL LOW (ref 96–112)
Creatinine, Ser: 2.62 mg/dL — ABNORMAL HIGH (ref 0.50–1.35)
GFR, EST AFRICAN AMERICAN: 29 mL/min — AB (ref >90)
GFR, EST NON AFRICAN AMERICAN: 25 mL/min — AB (ref >90)
Glucose, Bld: 172 mg/dL — ABNORMAL HIGH (ref 70–99)
POTASSIUM: 4.6 meq/L (ref 3.7–5.3)
SODIUM: 134 meq/L — AB (ref 137–147)

## 2013-03-26 LAB — CBC WITH DIFFERENTIAL/PLATELET
Basophils Absolute: 0 10*3/uL (ref 0.0–0.1)
Basophils Relative: 0 % (ref 0–1)
EOS PCT: 1 % (ref 0–5)
Eosinophils Absolute: 0.1 10*3/uL (ref 0.0–0.7)
HCT: 24.8 % — ABNORMAL LOW (ref 39.0–52.0)
HEMOGLOBIN: 7.7 g/dL — AB (ref 13.0–17.0)
LYMPHS ABS: 1.8 10*3/uL (ref 0.7–4.0)
Lymphocytes Relative: 22 % (ref 12–46)
MCH: 23.2 pg — ABNORMAL LOW (ref 26.0–34.0)
MCHC: 31 g/dL (ref 30.0–36.0)
MCV: 74.7 fL — AB (ref 78.0–100.0)
Monocytes Absolute: 0.4 10*3/uL (ref 0.1–1.0)
Monocytes Relative: 5 % (ref 3–12)
Neutro Abs: 6 10*3/uL (ref 1.7–7.7)
Neutrophils Relative %: 72 % (ref 43–77)
Platelets: 402 10*3/uL — ABNORMAL HIGH (ref 150–400)
RBC: 3.32 MIL/uL — ABNORMAL LOW (ref 4.22–5.81)
RDW: 18.3 % — ABNORMAL HIGH (ref 11.5–15.5)
WBC: 8.3 10*3/uL (ref 4.0–10.5)

## 2013-03-26 LAB — GLUCOSE, CAPILLARY
Glucose-Capillary: 164 mg/dL — ABNORMAL HIGH (ref 70–99)
Glucose-Capillary: 200 mg/dL — ABNORMAL HIGH (ref 70–99)
Glucose-Capillary: 270 mg/dL — ABNORMAL HIGH (ref 70–99)
Glucose-Capillary: 150 mg/dL — ABNORMAL HIGH (ref 70–99)

## 2013-03-26 NOTE — Progress Notes (Signed)
Subjective: Joshua [name] was seen and examined at bedside.  He is getting frustrated due to his prolonged hospital stay but he is thankful for his care.  He denies any more chest pain but did have Temp of 100.54F this morning.    Objective: Vital signs in last 24 hours: Filed Vitals:   03/26/13 0527 03/26/13 0817 03/26/13 0948 03/26/13 1354  BP: 147/61 157/68 171/80 127/63  Pulse: 75 76 75 71  Temp: 100.5 F (38.1 C)  99.1 F (37.3 C) 98.5 F (36.9 C)  TempSrc: Oral  Oral Oral  Resp: 18   18  Height:      Weight: 179 lb 10.8 oz (81.5 kg)     SpO2: 95% 95%  96%   Weight change: 14 oz (0.397 kg)  Intake/Output Summary (Last 24 hours) at 03/26/13 1457 Last data filed at 03/26/13 1300  Gross per 24 hour  Intake   1320 ml  Output   1350 ml  Net    -30 ml   Physical Exam Vitals reviewed. General: resting in bed, NAD HEENT: PERRL, EOMI Cardiac: RRR, +SEM Pulm: clear to auscultation bilaterally, no wheezes, rales, or rhonchi Abd: soft, nontender, nondistended, BS present Ext: warm and well perfused, no pedal edema Neuro: alert and oriented X3, cranial nerves II-XII grossly intact, strength equal in bilateral upper and lower extremities  Lab Results: Basic Metabolic Panel:  Recent Labs Lab 03/22/13 1357  03/25/13 0500 03/26/13 0530  NA 134*  < > 139 134*  K 4.5  < > 4.6 4.6  CL 97  < > 99 95*  CO2 16*  < > 21 20  GLUCOSE 155*  < > 84 172*  BUN 28*  < > 37* 39*  CREATININE 1.96*  < > 2.47* 2.62*  CALCIUM 8.7  < > 8.3* 7.7*  PHOS 3.2  --   --   --   < > = values in this interval not displayed. Liver Function Tests:  Recent Labs Lab 03/20/13 0822 03/22/13 1357  AST 12  --   ALT 11  --   ALKPHOS 252*  --   BILITOT 0.3  --   PROT 8.4*  --   ALBUMIN 2.8* 2.2*   CBC:  Recent Labs Lab 03/20/13 0822 03/26/13 1120  WBC 6.3 8.3  NEUTROABS  --  6.0  HGB 8.6* 7.7*  HCT 28.9* 24.8*  MCV 73.9* 74.7*  PLT 370 402*   Cardiac Enzymes:  Recent Labs Lab  03/20/13 1228 03/20/13 1950 03/21/13 0347  TROPONINI <0.30 <0.30 <0.30   BNP:  Recent Labs Lab 03/20/13 0822  PROBNP 4252.0*   D-Dimer:  Recent Labs Lab 03/20/13 1118  DDIMER 2.01*   CBG:  Recent Labs Lab 03/25/13 0804 03/25/13 1156 03/25/13 1709 03/25/13 2118 03/26/13 0802 03/26/13 1151  GLUCAP 73 150* 110* 151* 164* 200*   Hemoglobin A1C:  Recent Labs Lab 03/20/13 1228  HGBA1C 7.8*   Thyroid Function Tests:  Recent Labs Lab 03/20/13 1118  TSH 15.258*  FREET4 0.93   Coagulation:  Recent Labs Lab 03/20/13 1118  LABPROT 14.4  INR 1.14   Micro Results: Recent Results (from the past 240 hour(s))  CULTURE, BLOOD (ROUTINE X 2)     Status: None   Collection Time    03/21/13  9:40 AM      Result Value Range Status   Specimen Description BLOOD RIGHT HAND   Final   Special Requests BOTTLES DRAWN AEROBIC AND ANAEROBIC 10CC   Final  Culture  Setup Time     Final   Value: 03/21/2013 16:16     Performed at [facility]   Culture     Final   Value:        BLOOD CULTURE RECEIVED NO GROWTH TO DATE CULTURE WILL BE HELD FOR 5 DAYS BEFORE ISSUING A FINAL NEGATIVE REPORT     Performed at [facility]   Report Status PENDING   Incomplete  CULTURE, BLOOD (ROUTINE X 2)     Status: None   Collection Time    03/21/13  9:50 AM      Result Value Range Status   Specimen Description BLOOD RIGHT HAND   Final   Special Requests BOTTLES DRAWN AEROBIC ONLY 5CC   Final   Culture  Setup Time     Final   Value: 03/21/2013 16:16     Performed at [facility]   Culture     Final   Value:        BLOOD CULTURE RECEIVED NO GROWTH TO DATE CULTURE WILL BE HELD FOR 5 DAYS BEFORE ISSUING A FINAL NEGATIVE REPORT     Performed at [facility]   Report Status PENDING   Incomplete   2D echo 03/22/2012- Study Conclusions  - Left ventricle: The cavity size was mildly dilated. Wall thickness was normal. Systolic function was mildly reduced. The  estimated ejection fraction was in the range of 45% to 50%. There is hypokinesis of the lateral myocardium. Doppler parameters are consistent with restrictive physiology, indicative of decreased left ventricular diastolic compliance and/or increased left atrial pressure. Doppler parameters are consistent with high ventricular filling pressure. - Aortic valve: Valve mobility was restricted. There was mild to moderate stenosis. Trivial regurgitation. Valve area: 1.56cm^2(VTI). Valve area: 1.47cm^2 (Vmax). - Mitral valve: Mild regurgitation. - Left atrium: The atrium was mildly dilated. - Pulmonary arteries: Systolic pressure was mildly increased. PA peak pressure: 21mm Hg (S).  Medications: I have reviewed the patient's current medications. Scheduled Meds: . aspirin  81 mg Oral Daily  . atenolol  50 mg Oral Daily  . atorvastatin  80 mg Oral q1800  . clopidogrel  75 mg Oral Q breakfast  . diltiazem  180 mg Oral BID  . feeding supplement (ENSURE COMPLETE)  237 mL Oral TID BM  . heparin  5,000 Units Subcutaneous Q8H  . insulin aspart  0-9 Units Subcutaneous TID WC  . insulin glargine  24 Units Subcutaneous Daily  . isosorbide mononitrate  30 mg Oral Daily  . levothyroxine  150 mcg Oral Daily  . methocarbamol  500 mg Oral BID  . mycophenolate  720 mg Oral BID  . pantoprazole  40 mg Oral Daily  . regadenoson  0.4 mg Intravenous Once  . sirolimus  4 mg Oral Daily   Continuous Infusions:   PRN Meds:.sodium chloride, nitroGLYCERIN, oxyCODONE, oxyCODONE-acetaminophen, sodium chloride  Assessment/Plan:  Chest pain with hx of CAD, dCHF and current atrial tachycardia: improving. No more chest pain and improved HR with increase in Cardizem yesterday.  Did receive another dose of IV lasix 40mg  yesterday x1.  I/O negative 1.1L, though his weight is up 1 pound today to 179. -Appreciate cardiology following.  Okay to go home per cardiology with increased cardizem to 180mg  bid.  -Continue  cardiac medications: Nitroglycerin, aspirin, lipitor, atenolol, Plavix, diltiazem, IMDUR -monitor on telemetry  Anion Gap Metabolic acidosis: AG stable at 19. Unclear etiology. Starvation ketoacidosis vs. medication side effect's vs CKD. Sirolimus and  mycophenolate can both cause acidosis, though unsure if they specifically can cause an increase in AG. Lactic acid 1.1 and salicylate level wnl. CO2 of 20.  -encourage eating, continue ensure TID as per nutrition   Fever of unknown origin: Unclear etiology, however possibly secondary to metastatic prostate cancer. T max overnight was 100.5 this morning.  No obvious signs of infection, though patient is at risk for multiple opportunistic organisms given he is immunosuppressed.  -BCx x 2 NGTD -tylenol prn fever  Acute on chronic renal failure with CKD with hx of bilateral kidney transplantation: Cr continues to rise, 2.62 this morning in setting of continued diuresis that was stopped yesterday. Baseline ~1.8. Mycophenolate level 114 (high), sirolimus level 11 (within expected range).   Recent Labs Lab 03/22/13 1357 03/23/13 0850 03/24/13 0503 03/25/13 0500 03/26/13 0530  CREATININE 1.96* 1.96* 2.23* 2.47* 2.62*   -continue home sirolimus and mycophenolate -am bmet, hopeful decline with stopping of lasix  Metastatic Prostate cancer with back pain: Stable.  -Outpatient uro-oncology work up  HTN: stable -continue Amlodipine 10 mg daily and atenolol 50 mg daily, IMDUR 30mg  daily and diltiazem 180mg  bid  DM-II: A1c 7.8 on admission. On Lantus 24 U daily at home. CBGs controlled, 73-221. -will continue lantus to 24U qHS -SSI sensitive   DVT px: Heparin sq Diet: carb modified Dispo: possible d/c tomorrow with clinical improvement  The patient does have a current PCP (Sueanne Margarita, MD) and does not need an [facility] hospital follow-up appointment after discharge.  The patient does not have transportation limitations that hinder transportation  to clinic appointments.  Services Needed at time of discharge: Y = Yes, Blank = No PT:   OT:   RN:   Equipment:   Other:     LOS: 6 days   Jerene Pitch, MD 03/26/2013, 2:57 PM

## 2013-03-26 NOTE — Progress Notes (Signed)
Subjective:  No shortness of breath and is feeling better at the present time. His atrial tachycardia has settled down overnight. Some episodes of nonsustained wide-complex tachycardia that are brief are noted.  Objective:  Vital Signs in the last 24 hours: BP 171/80  Pulse 75  Temp(Src) 99.1 F (37.3 C) (Oral)  Resp 18  Ht 5\' 11"  (1.803 m)  Wt 81.5 kg (179 lb 10.8 oz)  BMI 25.07 kg/m2  SpO2 95%  Physical Exam: Mildly obese black male in no acute distress Lungs:  Clear Cardiac:  Regular rhythm, normal S1 and S2, no S3, episodes of tachycardia noted at times. Abdomen:  Soft, nontender, no masses Extremities:  No edema present  Intake/Output from previous day: 01/17 0701 - 01/18 0700 In: 600 [P.O.:600] Out: 1700 [Urine:1700]  Weight Filed Weights   03/24/13 8502 03/25/13 0517 03/26/13 0527  Weight: 80.74 kg (178 lb) 81.103 kg (178 lb 12.8 oz) 81.5 kg (179 lb 10.8 oz)    Lab Results: Basic Metabolic Panel:  Recent Labs  03/25/13 0500 03/26/13 0530  NA 139 134*  K 4.6 4.6  CL 99 95*  CO2 21 20  GLUCOSE 84 172*  BUN 37* 39*  CREATININE 2.47* 2.62*   Telemetry: Sinus rhythm with continued episodes of atrial tachycardia.  Assessment/Plan:  1. Atrial tachycardia recurrent 2. Acute diastolic heart failure clinically improved since yesterday 3. Chest pain resolved  4. Stage III chronic kidney disease  Recommendations:  From a cardiac viewpoint he wants to go home and I think this will be fine. He should go home on sustained release diltiazem 180 mg twice daily. With his increasing creatinine, I would be careful with his diuretics. He should followup with Dr. Radford Pax in one to 2 weeks after discharge.      Kerry Hough  MD [facility]  03/26/2013, 11:40 AM

## 2013-03-26 NOTE — Progress Notes (Signed)
Patient ambulated in hallway (150 feet) with no assistance.  Tolerated ambulation well, went slowly.  Will continue to monitor. [place], Ardeth Sportsman

## 2013-03-27 ENCOUNTER — Inpatient Hospital Stay (HOSPITAL_COMMUNITY): Payer: Medicare Other

## 2013-03-27 DIAGNOSIS — N184 Chronic kidney disease, stage 4 (severe): Secondary | ICD-10-CM

## 2013-03-27 DIAGNOSIS — E875 Hyperkalemia: Secondary | ICD-10-CM

## 2013-03-27 LAB — BASIC METABOLIC PANEL
BUN: 44 mg/dL — ABNORMAL HIGH (ref 6–23)
BUN: 50 mg/dL — ABNORMAL HIGH (ref 6–23)
BUN: 52 mg/dL — AB (ref 6–23)
BUN: 52 mg/dL — ABNORMAL HIGH (ref 6–23)
CHLORIDE: 92 meq/L — AB (ref 96–112)
CHLORIDE: 93 meq/L — AB (ref 96–112)
CHLORIDE: 92 meq/L — AB (ref 96–112)
CO2: 19 meq/L (ref 19–32)
CO2: 20 meq/L (ref 19–32)
CO2: 22 mEq/L (ref 19–32)
CO2: 21 mEq/L (ref 19–32)
CREATININE: 2.6 mg/dL — AB (ref 0.50–1.35)
CREATININE: 2.71 mg/dL — AB (ref 0.50–1.35)
Calcium: 7.7 mg/dL — ABNORMAL LOW (ref 8.4–10.5)
Calcium: 7.7 mg/dL — ABNORMAL LOW (ref 8.4–10.5)
Calcium: 8.2 mg/dL — ABNORMAL LOW (ref 8.4–10.5)
Calcium: 8.5 mg/dL (ref 8.4–10.5)
Chloride: 94 mEq/L — ABNORMAL LOW (ref 96–112)
Creatinine, Ser: 2.42 mg/dL — ABNORMAL HIGH (ref 0.50–1.35)
Creatinine, Ser: 2.67 mg/dL — ABNORMAL HIGH (ref 0.50–1.35)
GFR calc Af Amer: 32 mL/min — ABNORMAL LOW (ref >90)
GFR calc Af Amer: 29 mL/min — ABNORMAL LOW (ref >90)
GFR calc Af Amer: 28 mL/min — ABNORMAL LOW (ref >90)
GFR calc Af Amer: 28 mL/min — ABNORMAL LOW (ref >90)
GFR calc non Af Amer: 27 mL/min — ABNORMAL LOW (ref >90)
GFR calc non Af Amer: 25 mL/min — ABNORMAL LOW (ref >90)
GFR calc non Af Amer: 24 mL/min — ABNORMAL LOW (ref >90)
GFR, EST NON AFRICAN AMERICAN: 24 mL/min — AB (ref >90)
Glucose, Bld: 261 mg/dL — ABNORMAL HIGH (ref 70–99)
Glucose, Bld: 313 mg/dL — ABNORMAL HIGH (ref 70–99)
Glucose, Bld: 279 mg/dL — ABNORMAL HIGH (ref 70–99)
Glucose, Bld: 274 mg/dL — ABNORMAL HIGH (ref 70–99)
POTASSIUM: 5.4 meq/L — AB (ref 3.7–5.3)
Potassium: 5.6 mEq/L — ABNORMAL HIGH (ref 3.7–5.3)
Potassium: 6.1 mEq/L — ABNORMAL HIGH (ref 3.7–5.3)
Potassium: 5.2 mEq/L (ref 3.7–5.3)
SODIUM: 134 meq/L — AB (ref 137–147)
SODIUM: 135 meq/L — AB (ref 137–147)
Sodium: 133 mEq/L — ABNORMAL LOW (ref 137–147)
Sodium: 135 mEq/L — ABNORMAL LOW (ref 137–147)

## 2013-03-27 LAB — CBC
HCT: 25 % — ABNORMAL LOW (ref 39.0–52.0)
Hemoglobin: 7.7 g/dL — ABNORMAL LOW (ref 13.0–17.0)
MCH: 22.9 pg — ABNORMAL LOW (ref 26.0–34.0)
MCHC: 30.8 g/dL (ref 30.0–36.0)
MCV: 74.4 fL — AB (ref 78.0–100.0)
Platelets: 395 10*3/uL (ref 150–400)
RBC: 3.36 MIL/uL — ABNORMAL LOW (ref 4.22–5.81)
RDW: 18.4 % — ABNORMAL HIGH (ref 11.5–15.5)
WBC: 9.1 10*3/uL (ref 4.0–10.5)

## 2013-03-27 LAB — URINALYSIS, ROUTINE W REFLEX MICROSCOPIC
Glucose, UA: NEGATIVE mg/dL
Ketones, ur: NEGATIVE mg/dL
Leukocytes, UA: NEGATIVE
NITRITE: NEGATIVE
PH: 5 (ref 5.0–8.0)
Protein, ur: 100 mg/dL — AB
SPECIFIC GRAVITY, URINE: 1.027 (ref 1.005–1.030)
Urobilinogen, UA: 1 mg/dL (ref 0.0–1.0)

## 2013-03-27 LAB — FOLATE: Folate: 12.9 ng/mL

## 2013-03-27 LAB — EXPECTORATED SPUTUM ASSESSMENT W GRAM STAIN, RFLX TO RESP C

## 2013-03-27 LAB — HEPATIC FUNCTION PANEL
ALK PHOS: 385 U/L — AB (ref 39–117)
ALT: 26 U/L (ref 0–53)
AST: 45 U/L — ABNORMAL HIGH (ref 0–37)
Albumin: 2.1 g/dL — ABNORMAL LOW (ref 3.5–5.2)
BILIRUBIN TOTAL: 0.3 mg/dL (ref 0.3–1.2)
Total Protein: 7.5 g/dL (ref 6.0–8.3)

## 2013-03-27 LAB — CULTURE, BLOOD (ROUTINE X 2)
Culture: NO GROWTH
Culture: NO GROWTH

## 2013-03-27 LAB — EXPECTORATED SPUTUM ASSESSMENT W REFEX TO RESP CULTURE

## 2013-03-27 LAB — GLUCOSE, CAPILLARY
GLUCOSE-CAPILLARY: 265 mg/dL — AB (ref 70–99)
GLUCOSE-CAPILLARY: 263 mg/dL — AB (ref 70–99)
Glucose-Capillary: 229 mg/dL — ABNORMAL HIGH (ref 70–99)
Glucose-Capillary: 284 mg/dL — ABNORMAL HIGH (ref 70–99)
Glucose-Capillary: 266 mg/dL — ABNORMAL HIGH (ref 70–99)
Glucose-Capillary: 277 mg/dL — ABNORMAL HIGH (ref 70–99)

## 2013-03-27 LAB — CK TOTAL AND CKMB (NOT AT ARMC)
CK, MB: 1.2 ng/mL (ref 0.3–4.0)
Relative Index: INVALID (ref 0.0–2.5)
Total CK: 99 U/L (ref 7–232)

## 2013-03-27 LAB — RETICULOCYTES
RBC.: 3.35 MIL/uL — ABNORMAL LOW (ref 4.22–5.81)
Retic Count, Absolute: 43.6 10*3/uL (ref 19.0–186.0)
Retic Ct Pct: 1.3 % (ref 0.4–3.1)

## 2013-03-27 LAB — URINE MICROSCOPIC-ADD ON

## 2013-03-27 LAB — CREATININE, URINE, RANDOM: CREATININE, URINE: 214.72 mg/dL

## 2013-03-27 LAB — IRON AND TIBC
IRON: 17 ug/dL — AB (ref 42–135)
Saturation Ratios: 10 % — ABNORMAL LOW (ref 20–55)
TIBC: 166 ug/dL — ABNORMAL LOW (ref 215–435)
UIBC: 149 ug/dL (ref 125–400)

## 2013-03-27 LAB — FERRITIN: Ferritin: 5064 ng/mL — ABNORMAL HIGH (ref 22–322)

## 2013-03-27 LAB — VITAMIN B12: Vitamin B-12: 554 pg/mL (ref 211–911)

## 2013-03-27 LAB — TROPONIN I: Troponin I: <0.3 ng/mL (ref <0.30)

## 2013-03-27 LAB — PRO B NATRIURETIC PEPTIDE: PRO B NATRI PEPTIDE: 19450 pg/mL — AB (ref 0–125)

## 2013-03-27 LAB — OCCULT BLOOD X 1 CARD TO LAB, STOOL: Fecal Occult Bld: NEGATIVE

## 2013-03-27 LAB — SODIUM, URINE, RANDOM

## 2013-03-27 MED ORDER — SODIUM CHLORIDE 0.9 % IV SOLN
1020.0000 mg | Freq: Once | INTRAVENOUS | Status: AC
  Administered 2013-03-27: 1020 mg via INTRAVENOUS
  Filled 2013-03-27: qty 34

## 2013-03-27 MED ORDER — ALBUTEROL SULFATE (2.5 MG/3ML) 0.083% IN NEBU: 2.5000 mg | INHALATION_SOLUTION | Freq: Once | RESPIRATORY_TRACT | Status: DC

## 2013-03-27 MED ORDER — INSULIN ASPART 100 UNIT/ML ~~LOC~~ SOLN
10.0000 [IU] | Freq: Once | SUBCUTANEOUS | Status: DC
Start: 2013-03-27 — End: 2013-03-27

## 2013-03-27 MED ORDER — SODIUM CHLORIDE 0.9 % IV SOLN
2.0000 g | Freq: Once | INTRAVENOUS | Status: AC
  Administered 2013-03-27: 2 g via INTRAVENOUS
  Filled 2013-03-27: qty 20

## 2013-03-27 MED ORDER — METOPROLOL SUCCINATE ER 50 MG PO TB24
50.0000 mg | ORAL_TABLET | Freq: Every day | ORAL | Status: DC
  Filled 2013-03-27: qty 1

## 2013-03-27 MED ORDER — OXYCODONE HCL 5 MG PO TABS
10.0000 mg | ORAL_TABLET | ORAL | Status: DC | PRN
  Administered 2013-03-27 – 2013-03-30 (×15): 10 mg via ORAL
  Filled 2013-03-27 (×15): qty 2

## 2013-03-27 MED ORDER — SODIUM CHLORIDE 0.9 % IV SOLN
1.0000 g | Freq: Once | INTRAVENOUS | Status: AC
  Administered 2013-03-27: 1 g via INTRAVENOUS
  Filled 2013-03-27: qty 10

## 2013-03-27 MED ORDER — SODIUM POLYSTYRENE SULFONATE 15 GM/60ML PO SUSP
45.0000 g | Freq: Once | ORAL | Status: DC
  Filled 2013-03-27: qty 180

## 2013-03-27 MED ORDER — OXYCODONE HCL 5 MG PO TABS
5.0000 mg | ORAL_TABLET | Freq: Once | ORAL | Status: AC
  Administered 2013-03-27: 5 mg via ORAL
  Filled 2013-03-27: qty 1

## 2013-03-27 MED ORDER — ONDANSETRON 4 MG PO TBDP
4.0000 mg | ORAL_TABLET | Freq: Once | ORAL | Status: AC
  Administered 2013-03-27: 4 mg via ORAL
  Filled 2013-03-27: qty 1

## 2013-03-27 MED ORDER — METOPROLOL TARTRATE 25 MG PO TABS
25.0000 mg | ORAL_TABLET | Freq: Four times a day (QID) | ORAL | Status: DC
  Administered 2013-03-27 – 2013-03-29 (×7): 25 mg via ORAL
  Filled 2013-03-27 (×11): qty 1

## 2013-03-27 MED ORDER — GI COCKTAIL ~~LOC~~
30.0000 mL | Freq: Once | ORAL | Status: AC
  Administered 2013-03-27: 30 mL via ORAL
  Filled 2013-03-27: qty 30

## 2013-03-27 MED ORDER — METOPROLOL TARTRATE 25 MG PO TABS
25.0000 mg | ORAL_TABLET | Freq: Four times a day (QID) | ORAL | Status: DC
  Filled 2013-03-27 (×3): qty 1

## 2013-03-27 MED ORDER — SODIUM POLYSTYRENE SULFONATE 15 GM/60ML PO SUSP
15.0000 g | Freq: Once | ORAL | Status: AC
  Administered 2013-03-27: 15 g via ORAL
  Filled 2013-03-27: qty 60

## 2013-03-27 MED ORDER — DEXTROSE 50 % IV SOLN
1.0000 | Freq: Once | INTRAVENOUS | Status: AC
  Administered 2013-03-27: 50 mL via INTRAVENOUS
  Filled 2013-03-27: qty 50

## 2013-03-27 MED ORDER — INSULIN ASPART 100 UNIT/ML ~~LOC~~ SOLN
10.0000 [IU] | Freq: Once | SUBCUTANEOUS | Status: AC
  Administered 2013-03-27: 10 [IU] via INTRAVENOUS

## 2013-03-27 MED ORDER — ALBUTEROL SULFATE (2.5 MG/3ML) 0.083% IN NEBU
15.0000 mg | INHALATION_SOLUTION | Freq: Once | RESPIRATORY_TRACT | Status: AC
Start: 2013-03-27 — End: 2013-03-27
  Administered 2013-03-27: 15 mg via RESPIRATORY_TRACT
  Filled 2013-03-27 (×2): qty 18

## 2013-03-27 MED ORDER — DARBEPOETIN ALFA-POLYSORBATE 200 MCG/0.4ML IJ SOLN
200.0000 ug | Freq: Once | INTRAMUSCULAR | Status: AC
  Administered 2013-03-27: 200 ug via SUBCUTANEOUS
  Filled 2013-03-27: qty 0.4

## 2013-03-27 MED ORDER — SODIUM BICARBONATE 650 MG PO TABS
1300.0000 mg | ORAL_TABLET | Freq: Two times a day (BID) | ORAL | Status: DC
  Administered 2013-03-27 – 2013-03-29 (×5): 1300 mg via ORAL
  Filled 2013-03-27 (×7): qty 2

## 2013-03-27 NOTE — Progress Notes (Addendum)
Subjective: Joshua [name] was seen and examined at bedside. He c/o having sharp substernal chest pain that when I saw him was 6/10, though had been 10/10. He had received 2 SL NTG by the time I saw him. CP was associated with some nausea and mild SOB, though no diaphoresis. The CP was identical to the episodes that originally brought him into the hospital. Patient had hyperkalemia this AM w/ some peaked T waves on EKG. T max over night 99.2 off of any tylenol containing pain medications. He also c/o having developed a new cough last night that is productive of red tinged colored sputum. Back pain is present, though still well controlled.  Objective: Vital signs in last 24 hours: Filed Vitals:   03/27/13 0724 03/27/13 0929 03/27/13 0945 03/27/13 1220  BP: 127/65 141/58 133/62 134/67  Pulse: 60 62  64  Temp:    98.4 F (36.9 C)  TempSrc:    Oral  Resp:    18  Height:      Weight:      SpO2: 93%   96%   Weight change:   Intake/Output Summary (Last 24 hours) at 03/27/13 1508 Last data filed at 03/27/13 0900  Gross per 24 hour  Intake    840 ml  Output    400 ml  Net    440 ml   Physical Exam General: sitting in chair, appears uncomfortable w/ mildly increased WOB HEENT: [place]/AT, vision grossly intact Cardiac: RRR, harsh systolic ejection murmur loudest over R sternal border Pulm: clear to auscultation bilaterally, no wheezes, rales, or rhonchi Abd: soft, nontender, nondistended, BS present Ext: warm and well perfused, no pedal edema Neuro: alert and oriented X3, cranial nerves II-XII grossly intact, moves all extremities spontaneously, gait is slow w/ walker and he appears somewhat tremulous   Lab Results: Basic Metabolic Panel:  Recent Labs Lab 03/22/13 1357  03/27/13 0430 03/27/13 1300  NA 134*  < > 134* 133*  K 4.5  < > 5.6* 6.1*  CL 97  < > 94* 92*  CO2 16*  < > 19 20  GLUCOSE 155*  < > 261* 313*  BUN 28*  < > 44* 50*  CREATININE 1.96*  < > 2.42* 2.60*  CALCIUM 8.7   < > 7.7* 7.7*  PHOS 3.2  --   --   --   < > = values in this interval not displayed. Liver Function Tests:  Recent Labs Lab 03/22/13 1357  ALBUMIN 2.2*   CBC:  Recent Labs Lab 03/26/13 1120 03/27/13 0430  WBC 8.3 9.1  NEUTROABS 6.0  --   HGB 7.7* 7.7*  HCT 24.8* 25.0*  MCV 74.7* 74.4*  PLT 402* 395   Cardiac Enzymes:  Recent Labs Lab 03/20/13 1950 03/21/13 0347 03/27/13 0930  CKTOTAL  --   --  99  CKMB  --   --  1.2  TROPONINI <0.30 <0.30 <0.30   CBG:  Recent Labs Lab 03/26/13 1151 03/26/13 1639 03/26/13 2027 03/27/13 0805 03/27/13 1212 03/27/13 1504  GLUCAP 200* 270* 150* 229* 284* 266*   Micro Results: Recent Results (from the past 240 hour(s))  CULTURE, BLOOD (ROUTINE X 2)     Status: None   Collection Time    03/21/13  9:40 AM      Result Value Range Status   Specimen Description BLOOD RIGHT HAND   Final   Special Requests BOTTLES DRAWN AEROBIC AND ANAEROBIC 10CC   Final   Culture  Setup  Time     Final   Value: 03/21/2013 16:16     Performed at [facility]   Culture     Final   Value: NO GROWTH 5 DAYS     Performed at [facility]   Report Status 03/27/2013 FINAL   Final  CULTURE, BLOOD (ROUTINE X 2)     Status: None   Collection Time    03/21/13  9:50 AM      Result Value Range Status   Specimen Description BLOOD RIGHT HAND   Final   Special Requests BOTTLES DRAWN AEROBIC ONLY 5CC   Final   Culture  Setup Time     Final   Value: 03/21/2013 16:16     Performed at [facility]   Culture     Final   Value: NO GROWTH 5 DAYS     Performed at [facility]   Report Status 03/27/2013 FINAL   Final   Dg Chest 2 View  03/27/2013   CLINICAL DATA:  Cough, hemoptysis  EXAM: CHEST  2 VIEW  COMPARISON:  03/23/2013  FINDINGS: Multifocal patchy airspace opacities, most prominent in the right perihilar region, mildly increased. Differential considerations include mild to moderate interstitial edema or possibly multifocal  infection.  Suspected small left pleural effusion.  No pneumothorax.  Cardiomegaly.  Surgical clips overlying the bilateral neck.  IMPRESSION: Multifocal patchy airspace opacities, most prominent in the right perihilar region, mildly increased. Differential considerations include mild to moderate interstitial edema or possibly multifocal infection.  Cardiomegaly with suspected small left pleural effusion.   Electronically Signed   By: Julian Hy M.D.   On: 03/27/2013 13:00   Dg Chest 2 View  03/23/2013   CLINICAL DATA:  Chest pain with history of congestive heart failure  EXAM: CHEST  2 VIEW  COMPARISON:  03/20/2013  FINDINGS: Heart size upper normal. Mild vascular congestion has developed when compared to the prior study. There is bilateral perihilar peribronchial cuffing with indistinct bilateral perihilar vessels and mild hazy opacity in the perihilar regions bilaterally. There are no pleural effusions. Surgical clips in the thyroid bed again identified.  IMPRESSION: Findings suggest development of congestive heart failure with mild pulmonary edema.   Electronically Signed   By: Skipper Cliche M.D.   On: 03/23/2013 16:22   Dg Chest 2 View  03/20/2013   CLINICAL DATA:  Chest and back pain.  Hypertension.  EXAM: CHEST  2 VIEW  COMPARISON:  08/19/2011  FINDINGS: The lungs are clear without focal infiltrate, edema, pneumothorax or pleural effusion. Cardiopericardial silhouette is at upper limits of normal for size. Imaged bony structures of the thorax are intact. Surgical clips are seen in the thyroid bed and left axilla.  IMPRESSION: Stable.  No acute cardiopulmonary process.   Electronically Signed   By: Misty Stanley M.D.   On: 03/20/2013 07:45   Nm Bone Scan Whole Body  03/03/2013   CLINICAL DATA:  Pain.  Prostate cancer.  EXAM: NUCLEAR MEDICINE WHOLE BODY BONE SCAN  TECHNIQUE: Whole body anterior and posterior images were obtained approximately 3 hours after intravenous injection of  radiopharmaceutical.  COMPARISON:  Bone scan 10/26/2007.  CT 02/28/2013.  RADIOPHARMACEUTICALS:  26.0 mCi Technetium-99 MDP  FINDINGS: Bilateral renal atrophy with a transplanted kidney right pelvis. No focal bony abnormality. No evidence of bony metastatic disease.  IMPRESSION: No evidence of bony metastatic disease or focal bony abnormality. Bilateral renal atrophy. Transplanted kidney right pelvis.   Electronically Signed  By: [place]   On: 03/03/2013 14:02   US Renal  03/22/2013   CLINICAL DATA:  Acute kidney injury. Patient with bilateral renal transplant.  EXAM: RENAL/URINARY TRACT ULTRASOUND COMPLETE  COMPARISON:  CT, 02/28/2013  FINDINGS: Right Kidney:  Length: 7.4 cm. Kidney is echogenic. 15 mm cyst arises from the upper pole. There are probable additional tiny cysts. No hydronephrosis.  Left Kidney:  Native left kidney not visualized.  Transplant kidney: In the right lower quadrant to right upper pelvis, a transplant kidney is visualized. Is normal in overall size measuring 10.7 cm. It has normal parenchymal echogenicity. Small cyst arises from its lower pole measuring 1 cm. No other renal masses. No hydronephrosis.  Bladder:  Appears normal for degree of bladder distention.  IMPRESSION: 1. Transplant kidney is normal in appearance within a small lower pole cyst measuring 1 cm. 2. Atrophic native kidneys. Only the right was visualized. Is echogenic with at least 1 discrete small cysts. No hydronephrosis.   Electronically Signed   By: Lajean Manes M.D.   On: 03/22/2013 15:50   Nm Myocar Multi W/spect W/wall Motion / Ef  03/22/2013   CLINICAL DATA:  64yo AA man with PMH of HTN, DM2, CAD with stents, CAD, PVD s/p fem pop, prostate cancer treated with radiation, hx of thyroid cancer, h/o ESRD and kidney transplantation who presents for chest pain and back pain  EXAM: MYOCARDIAL IMAGING WITH SPECT (REST)  TECHNIQUE: Standard myocardial SPECT imaging was performed after resting intravenous  injection of Tc-70m sestamibi. Quantitative gated imaging was also performed to evaluate left ventricular wall motion, and estimate left ventricular ejection fraction. As the patient's heart rate was 129 bpm when he presented for his "stress" images, no additional administration of Lexiscan was performed.  COMPARISON:  None.  FINDINGS: The Image quality is good. There is mild interference from visceral racer uptake. The left ventricle is moderately to severely dilated. There is a moderate in size, moderate-to-severe in intensity inferior and inferolateral perfusion abnormality seen on both rest and "stress" images, possibly worse on the "stress" images. There is global hypokinesis of the left ventricle, with severe basal and mid inferior wall hypokinesis. EF 34%. The severity of LV dysfunction is out of proportion to the perfusion abnormality.  IMPRESSION: Interpretation of the study is severely limited by absence of a clear increase in coronary flow during acquisition of the "stress" images  There is evidence of severe ischemia at rest and/or scar in the right coronary artery distribution. This appears to be at least partly reversible.  The degree of LV dysfunction suggests multivessel CAD versus a component of superimposed nonischemic cardiomyopathy.  No previous studies are available for comparison   Electronically Signed   By: Sanda Klein   On: 03/22/2013 19:29   Nm Pulmonary Perf And Vent  03/20/2013   CLINICAL DATA:  Chest pain  EXAM: NUCLEAR MEDICINE VENTILATION - PERFUSION LUNG SCAN  Views: Anterior, posterior, left lateral, right lateral, RPO, LPO, RAO, LAO -ventilation and perfusion  Radionuclide: Technetium 40m DTPA -ventilation; Technetium 54m macroaggregated albumin-perfusion  Dose:  40.0 mCi-ventilation; 6.0 mCi-perfusion  Route of administration: Inhalation -ventilation; intravenous -perfusion  COMPARISON:  Chest radiograph March 20, 2013  FINDINGS: Radiotracer uptake on the ventilation study  is homogeneous and symmetric bilaterally.  Radiotracer uptake on the perfusion study is homogeneous and symmetric bilaterally.  There is no appreciable ventilation/perfusion mismatch.  IMPRESSION: No ventilation or perfusion abnormalities. Very low probability of pulmonary embolus.   Electronically Signed  By: Lowella Grip M.D.   On: 03/20/2013 16:15   2D echo 03/22/2012- Study Conclusions  - Left ventricle: The cavity size was mildly dilated. Wall thickness was normal. Systolic function was mildly reduced. The estimated ejection fraction was in the range of 45% to 50%. There is hypokinesis of the lateral myocardium. Doppler parameters are consistent with restrictive physiology, indicative of decreased left ventricular diastolic compliance and/or increased left atrial pressure. Doppler parameters are consistent with high ventricular filling pressure. - Aortic valve: Valve mobility was restricted. There was mild to moderate stenosis. Trivial regurgitation. Valve area: 1.56cm^2(VTI). Valve area: 1.47cm^2 (Vmax). - Mitral valve: Mild regurgitation. - Left atrium: The atrium was mildly dilated. - Pulmonary arteries: Systolic pressure was mildly increased. PA peak pressure: 45mm Hg (S).  Medications: I have reviewed the patient's current medications. Scheduled Meds: . aspirin  81 mg Oral Daily  . atorvastatin  80 mg Oral q1800  . calcium gluconate  1 g Intravenous Once  . clopidogrel  75 mg Oral Q breakfast  . dextrose  1 ampule Intravenous Once  . diltiazem  180 mg Oral BID  . feeding supplement (ENSURE COMPLETE)  237 mL Oral TID BM  . heparin  5,000 Units Subcutaneous Q8H  . insulin aspart  0-9 Units Subcutaneous TID WC  . insulin aspart  10 Units Subcutaneous Once  . insulin glargine  24 Units Subcutaneous Daily  . isosorbide mononitrate  30 mg Oral Daily  . levothyroxine  150 mcg Oral Daily  . methocarbamol  500 mg Oral BID  . metoprolol succinate  50 mg Oral Daily  .  mycophenolate  720 mg Oral BID  . pantoprazole  40 mg Oral Daily  . regadenoson  0.4 mg Intravenous Once  . sirolimus  4 mg Oral Daily   Continuous Infusions:   PRN Meds:.sodium chloride, oxyCODONE, sodium chloride  Assessment/Plan:  Chest pain with hx of CAD, dCHF and current atrial tachycardia as well as new onset hemoptysis and ongoing low-grade fever: Patient with another episode of chest pain this morning. There is no clear etiology, though we will rule out ACS again. He also has hyperkalemia with some EKG changes, which can induce cardiac arrhythmia and strain, though nothing unusual noted on telemetry. I was concerned for atypical lung infection given he is immunosuppresed, so obtained a CXR, which showed: multifocal patchy airspace opacities, most prominent in the right perihilar region as well as cardiomegaly and small L pleural effusion. At this point it is unclear if patient is fluid overloaded and this represents pulmonary edema, or if this represents multifocal PNA. PNA may be more likely given his persistent low-grade fever and he is immunosuppressed, which puts him at high risk for many different types on infections including opportunistic infections. Of note, his atrial tachycardia had been well controlled with diltiazem, though this regimen is changing as per below. -ID consult- recommend panculturing (sputum for aerobic, fungal, AFB culture, BCx x 2, UA, UCx) as well as CT chest (noncontrast), aspergillus Ag, CMV PCR, EBV pcr, beta D glucan (fungitell assay), pneumocystis stain; will consider starting HCAP coverage w/ vanc and cefepime if he clinically worsens -will need to consult pulmonology for bronchoscopy tomorrow -renal consult- recommend repeating sirolimus level as diltiazem can increase sirolimus toxicity -cardiology following- appreciate recs; today will switch diltiazem to metoprolol tartrate 25mg  q6h given diltiazem effect on sirolimus toxicity; d/c atenolol given  CKD -trend troponin-first one negative  -proBNP further increased from 4252 on admission to 19,450 today- cardiorenal vs  retention of BNP 2/2 A/CKD -CK-MB negative -monitor on telemetry -Continue cardiac medications: Nitroglycerin, aspirin, lipitor, Plavix, metoprolol, IMDUR -GI cocktail x 1 -PT/OT  Hyperkalemia: Patient found to have K 5.6 this AM with some peaking of T waves. He received kayexalate 15g this morning, though repeat BMP showed K 6.1- pt did not have BM after first dose. Repeat EKG showed worsening of peaked T waves.  -Calcium gluconate 1g -aspart insulin 10U w/ amp of D50 -Will repeat EKG and follow up BMP later this evening.  Anion Gap Metabolic acidosis: AG remains elevated and is 21 today. Unclear etiology. Starvation ketoacidosis vs. medication side effect vs CKD. Sirolimus and mycophenolate can both cause acidosis, though unsure if they specifically can cause an increase in AG. -encourage eating, continue ensure TID as per nutrition   Fever: Unclear etiology, however possibly secondary to metastatic prostate cancer. T max 99.2today off of all tylenol containing medications. CXR now showing possible multifocal infiltrate concerning for HCAP vs pulmonary edema. This is especially concerning given patient is immunocompromised so is at risk for multiple atypical infections. -ID consult- recommend panculturing (sputum culture, BCx x 2, UA, UCx) as well as CT chest (noncontrast)  Acute on chronic renal failure with CKD with hx of bilateral kidney transplantation: Cr had down trended this Am (Cr 2.62-->2.42), however, repeat BMP this afternoon revealed Cr 2.6. Unclear why Cr increased again as patient has not been receiving lasix. Consider sirolimus toxicity since diltiazem increases risk of this occuring.  -d/c diltiazem and start metoprolol as per above  -continue home sirolimus and mycophenolate -BMP in AM  Metastatic Prostate cancer with back pain: Stable.  -Outpatient  uro-oncology work up  HTN: stable -continue Amlodipine 10 mg daily, IMDUR 30mg  daily and metoprolol 25mg  q6h -d/c atenolol and diltiazem today  DM-II: A1c 7.8 on admission. On Lantus 24 U daily at home. CBGs controlled, 150-270 -will continue lantus to 24U qHS--consider increasing tomorrow -SSI sensitive   DVT px: Heparin sq Diet: carb modified Dispo: patient not ready for discharge yet--defer until clinical improvement  The patient does have a current PCP (Sueanne Margarita, MD) and does not need an [place] hospital follow-up appointment after discharge.  The patient does not have transportation limitations that hinder transportation to clinic appointments.  Services Needed at time of discharge: Y = Yes, Blank = No PT:   OT:   RN:   Equipment:   Other:     LOS: 7 days   Rebecca Eaton, MD 03/27/2013, 3:08 PM

## 2013-03-27 NOTE — Progress Notes (Addendum)
Patient C/O 9/10, sudden, sharp chest pain.  VSS, EKG obtained and 2 SL nitro given.  Patient reports pain is now 6/10. Melina Copa, PA & MD notified.  Will continue to monitor. Nicholson, Ardeth Sportsman

## 2013-03-27 NOTE — Progress Notes (Signed)
Patient reports coughing up blood.  Blood tinged sputum noted in cup on bedside table.  On call MD informed and order received to continue SQ heparin.  Patient states "this is the worst night I've had here," and complains of worsening chronic back pain and sleeplessness.  Also reports fevers and chills overnight.  Low grade fevers noted last pm, but afebrile this morning.  On call MD informed of patient's concerns.  No new orders received.  Will continue to monitor.  Jodell Cipro

## 2013-03-27 NOTE — Progress Notes (Signed)
Patient had a burst of SVT at 16:30.  Patient asymptomatic and MD notified.  Will continue to monitor. Kiln, Ardeth Sportsman

## 2013-03-27 NOTE — Consult Note (Signed)
[facility] CONSULT NOTE    Date: 03/27/2013                  Patient Name:  Joshua [name]  MRN: [id number]  DOB: 1950/08/01  Age / Sex: 63 y.o., male         PCP: Sueanne Margarita, MD                 Service Requesting Consult: Internal Medicine                 Reason for Consult: Acute on chronic renal failure            History of Present Illness: Patient is a 63 y.o. male with a PMHx of HTN, DM-2, CAD s/p stenting of LAD, hx of papillary thyroid cancer, CKD III s/p Renal transplant, PVD, Carotid artery stenosis s/p right carotid endarterectomy, metastatic prostate cancer who was admitted to [facility] on 03/20/2013 for evaluation of chest pain.   Patient presented with a 3 day history of substernal chest pain.  Given PMH cardiology was consulted. Lexiscan was obtained and was abnormal but study was limited.  Cardiology has continued to follow the patient and has elected for medical management in the setting of CKD.  During admission, patient's creatinine rose and has continued to be elevated above baseline despite IV fluids and diuresis.  Patient has also developed fever.  No known source at this time with negative cultures.  Last fever was on 1/18 @ 0500.  Patient is well known to the Nephrology service.  We were consulted this afternoon due to Acute on Chronic Renal failure.   Of note, upon review of the medical record it appears that the patient was started on Diltiazem on 1/14.  Diltiazem is a known inhibitor of metabolism of Sirolimus.    Medications: Outpatient medications: Prescriptions prior to admission  Medication Sig Dispense Refill  . amLODipine (NORVASC) 10 MG tablet Take 1 tablet (10 mg total) by mouth daily.  30 tablet  6  . aspirin 81 MG chewable tablet Chew 81 mg by mouth daily.        Marland Kitchen atenolol (TENORMIN) 50 MG tablet Take 50 mg by mouth daily.      Marland Kitchen atorvastatin (LIPITOR) 80 MG tablet TAKE 1 TABLET BY MOUTH EVERY DAY  30 tablet  0  . clopidogrel  (PLAVIX) 75 MG tablet Take 1 tablet (75 mg total) by mouth daily.  30 tablet  6  . insulin glargine (LANTUS) 100 UNIT/ML injection Inject 24 Units into the skin at bedtime.       . insulin lispro (HUMALOG) 100 UNIT/ML injection Inject 6 Units into the skin 3 (three) times daily as needed (for sugars over 280).       Marland Kitchen levothyroxine (SYNTHROID, LEVOTHROID) 150 MCG tablet Take 150 mcg by mouth daily.      . methocarbamol (ROBAXIN) 500 MG tablet Take 1 tablet (500 mg total) by mouth 2 (two) times daily.  20 tablet  1  . mycophenolate (MYFORTIC) 180 MG EC tablet Take 720 mg by mouth 2 (two) times daily.       Marland Kitchen oxyCODONE-acetaminophen (PERCOCET) 10-325 MG per tablet Take 1 tablet by mouth every 4 (four) hours as needed for pain.  30 tablet  0  . pantoprazole (PROTONIX) 40 MG tablet Take 40 mg by mouth daily.      . sirolimus (RAPAMUNE) 1 MG tablet Take 4 mg by mouth daily.  Current medications: Current Facility-Administered Medications  Medication Dose Route Frequency Provider Last Rate Last Dose  . 0.9 %  sodium chloride infusion  250 mL Intravenous PRN Ivor Costa, MD      . aspirin chewable tablet 81 mg  81 mg Oral Daily Ivor Costa, MD   81 mg at 03/27/13 0931  . atorvastatin (LIPITOR) tablet 80 mg  80 mg Oral q1800 Ivor Costa, MD   80 mg at 03/26/13 1659  . calcium gluconate 1 g in sodium chloride 0.9 % 100 mL IVPB  1 g Intravenous Once Jerene Pitch, MD   1 g at 03/27/13 1454  . clopidogrel (PLAVIX) tablet 75 mg  75 mg Oral Q breakfast [name], MD   75 mg at 03/27/13 0726  . diltiazem (CARDIZEM CD) 24 hr capsule 180 mg  180 mg Oral BID Jacolyn Reedy, MD   180 mg at 03/27/13 0726  . feeding supplement (ENSURE COMPLETE) (ENSURE COMPLETE) liquid 237 mL  237 mL Oral TID BM Dalene Carrow, RD   237 mL at 03/27/13 1400  . heparin injection 5,000 Units  5,000 Units Subcutaneous Q8H Ivor Costa, MD   5,000 Units at 03/27/13 1322  . insulin aspart (novoLOG) injection 0-9 Units   0-9 Units Subcutaneous TID WC [name], MD   5 Units at 03/27/13 1222  . insulin glargine (LANTUS) injection 24 Units  24 Units Subcutaneous Daily Rebecca Eaton, MD   24 Units at 03/27/13 [phone]  . isosorbide mononitrate (IMDUR) 24 hr tablet 30 mg  30 mg Oral Daily Rogelia Mire, NP   30 mg at 03/27/13 0945  . levothyroxine (SYNTHROID, LEVOTHROID) tablet 150 mcg  150 mcg Oral Daily Ivor Costa, MD   150 mcg at 03/27/13 0931  . methocarbamol (ROBAXIN) tablet 500 mg  500 mg Oral BID Ivor Costa, MD   500 mg at 03/27/13 0933  . metoprolol succinate (TOPROL-XL) 24 hr tablet 50 mg  50 mg Oral Daily Candee Furbish, MD      . mycophenolate (MYFORTIC) EC tablet 720 mg  720 mg Oral BID Ivor Costa, MD   720 mg at 03/27/13 0931  . oxyCODONE (Oxy IR/ROXICODONE) immediate release tablet 10 mg  10 mg Oral Q4H PRN Marrion Coy, MD   10 mg at 03/27/13 1030  . pantoprazole (PROTONIX) EC tablet 40 mg  40 mg Oral Daily Ivor Costa, MD   40 mg at 03/27/13 0933  . regadenoson (LEXISCAN) injection SOLN 0.4 mg  0.4 mg Intravenous Once [name], MD      . sirolimus (RAPAMUNE) tablet 4 mg  4 mg Oral Daily Ivor Costa, MD   4 mg at 03/27/13 0930  . sodium chloride 0.9 % injection 3 mL  3 mL Intravenous PRN Ivor Costa, MD   3 mL at 03/24/13 2024      Allergies: Allergies  Allergen Reactions  . Fentanyl     "Almost died"   . Propofol     "Almost died"     Past Medical History: Past Medical History  Diagnosis Date  . Carotid artery occlusion   . ASCVD (arteriosclerotic cardiovascular disease)   . CHF (congestive heart failure)   . Anginal pain     last chest pain was a couple of months ago-just lasted less than a minute  . Stroke     "light stroke" about time of MI  . Renal failure     s/p renal transplant  . Aortic stenosis  moderate AS by 06/2011 echo (Dr. Fransico Him)  . Coronary artery disease 03/2009    2 vessel s/p BMS to LAD in setting of NSTEMI, RCA chronically occluded  . PVD  (peripheral vascular disease)     s/p fempop 1980's  . Hypothyroidism     s/p papillary thyroid CA excision  . Hypertension   . Hyperlipidemia   . Aortic stenosis     moderate by echo 06/2012  . Diastolic dysfunction   . Heart murmur   . Myocardial infarct 03/2009 X2  . Type II diabetes mellitus   . Prostate cancer     "radiation tx ~ 4-5 yr ago; cleared then came back a couple months ago" (03/20/2013)   Past Surgical History: Past Surgical History  Procedure Laterality Date  . Nephrectomy transplanted organ Bilateral 2001  . Pr vein bypass graft,aorto-fem-pop  1980    fem pop   . Endarterectomy  08/25/2011    Procedure: ENDARTERECTOMY CAROTID;  Surgeon: Angelia Mould, MD;  Location: [place];  Service: Vascular;  Laterality: Right;  . Thyroidectomy    . Coronary angioplasty with stent placement  03/2009    "1"   Family History: Family History  Problem Relation Age of Onset  . Diabetes Mother   . Hyperlipidemia Mother   . Hypertension Mother   . Heart disease Mother     before age 102  . Hyperlipidemia Father   . Hypertension Father   . Heart disease Father   . Hyperlipidemia Sister   . Hypertension Sister   . Diabetes Brother   . Hypertension Brother   . Hyperlipidemia Brother    Social History: History   Social History  . Marital Status: Married    Spouse Name: N/A    Number of Children: N/A  . Years of Education: N/A   Occupational History  . Not on file.   Social History Main Topics  . Smoking status: Never Smoker   . Smokeless tobacco: Never Used  . Alcohol Use: No  . Drug Use: No  . Sexual Activity: Yes   Other Topics Concern  . Not on file   Social History Narrative  . No narrative on file   Review of Systems: As per HPI  Vital Signs: Blood pressure 134/67, pulse 64, temperature 98.4 F (36.9 C), temperature source Oral, resp. rate 18, height 5\' 11"  (1.803 m), weight 179 lb 10.8 oz (81.5 kg), SpO2 96.00%.  Weight trends: Filed Weights    03/24/13 2353 03/25/13 0517 03/26/13 0527  Weight: 178 lb (80.74 kg) 178 lb 12.8 oz (81.103 kg) 179 lb 10.8 oz (81.5 kg)   Physical Exam: General: well appearing, NAD. Asterixis, gross tremor HEENT: NCAT. Cardiovascular: RRR. No murmurs, rubs, or gallops. Gr 2/6 M Respiratory: CTAB. No rales, rhonchi, or wheeze. Bibasilar rales Abdomen: soft, nontender, nondistended.Renal Tx normal size and not tender Extremities: warm, well perfused. No LE edema. Skin: Warm, dry, intact. Dry and scaling, old [place] Neuro: No focal deficits.   Lab results: Basic Metabolic Panel:  Recent Labs Lab 03/22/13 1357  03/26/13 0530 03/27/13 0430 03/27/13 1300  NA 134*  < > 134* 134* 133*  K 4.5  < > 4.6 5.6* 6.1*  CL 97  < > 95* 94* 92*  CO2 16*  < > 20 19 20   GLUCOSE 155*  < > 172* 261* 313*  BUN 28*  < > 39* 44* 50*  CREATININE 1.96*  < > 2.62* 2.42* 2.60*  CALCIUM 8.7  < >  7.7* 7.7* 7.7*  PHOS 3.2  --   --   --   --   < > = values in this interval not displayed.  Liver Function Tests:  Recent Labs Lab 03/22/13 1357  ALBUMIN 2.2*   CBC:  Recent Labs Lab 03/26/13 1120 03/27/13 0430  WBC 8.3 9.1  NEUTROABS 6.0  --   HGB 7.7* 7.7*  HCT 24.8* 25.0*  MCV 74.7* 74.4*  PLT 402* 395    Cardiac Enzymes:  Recent Labs Lab 03/20/13 1950 03/21/13 0347 03/27/13 0930  CKTOTAL  --   --  99  CKMB  --   --  1.2  TROPONINI <0.30 <0.30 <0.30   BNP (last 3 results)  Recent Labs  03/20/13 0822  PROBNP 4252.0*    CBG:  Recent Labs Lab 03/26/13 1151 03/26/13 1639 03/26/13 2027 03/27/13 0805 03/27/13 1212  GLUCAP 200* 270* 150* 229* 284*    Microbiology: Results for orders placed during the hospital encounter of 03/20/13  CULTURE, BLOOD (ROUTINE X 2)     Status: None   Collection Time    03/21/13  9:40 AM      Result Value Range Status   Specimen Description BLOOD RIGHT HAND   Final   Special Requests BOTTLES DRAWN AEROBIC AND ANAEROBIC 10CC   Final   Culture  Setup  Time     Final   Value: 03/21/2013 16:16     Performed at [facility]   Culture     Final   Value: NO GROWTH 5 DAYS     Performed at [facility]   Report Status 03/27/2013 FINAL   Final  CULTURE, BLOOD (ROUTINE X 2)     Status: None   Collection Time    03/21/13  9:50 AM      Result Value Range Status   Specimen Description BLOOD RIGHT HAND   Final   Special Requests BOTTLES DRAWN AEROBIC ONLY 5CC   Final   Culture  Setup Time     Final   Value: 03/21/2013 16:16     Performed at [facility]     Final   Value: NO GROWTH 5 DAYS     Performed at [facility]   Report Status 03/27/2013 FINAL   Final   Urinalysis    Component Value Date/Time   COLORURINE YELLOW 03/21/2013 1344   APPEARANCEUR CLEAR 03/21/2013 1344   LABSPEC 1.021 03/21/2013 1344   PHURINE 5.5 03/21/2013 1344   GLUCOSEU NEGATIVE 03/21/2013 1344   HGBUR TRACE* 03/21/2013 1344   BILIRUBINUR SMALL* 03/21/2013 1344   KETONESUR 15* 03/21/2013 1344   PROTEINUR 100* 03/21/2013 1344   UROBILINOGEN 1.0 03/21/2013 1344   NITRITE NEGATIVE 03/21/2013 1344   LEUKOCYTESUR NEGATIVE 03/21/2013 1344    Imaging: Dg Chest 2 View 03/27/2013  IMPRESSION: Multifocal patchy airspace opacities, most prominent in the right perihilar region, mildly increased. Differential considerations include mild to moderate interstitial edema or possibly multifocal infection.  Cardiomegaly with suspected small left pleural effusion.    US Renal 03/22/2013   IMPRESSION: 1. Transplant kidney is normal in appearance within a small lower pole cyst measuring 1 cm. 2. Atrophic native kidneys. Only the right was visualized. Is echogenic with at least 1 discrete small cysts. No hydronephrosis.  Nm Myocar Multi W/spect W/wall Motion / Ef 03/22/2013    IMPRESSION: Interpretation of the study is severely limited by absence of a clear increase in coronary flow during acquisition of the "  stress" images  There is evidence of severe  ischemia at rest and/or scar in the right coronary artery distribution. This appears to be at least partly reversible.  The degree of LV dysfunction suggests multivessel CAD versus a component of superimposed nonischemic cardiomyopathy.  No previous studies are available for comparison     Assessment & Plan: Patient is a 63 y.o. male with a PMHx of HTN, DM-2, CAD s/p stenting of LAD, hx of papillary thyroid cancer, CKD III s/p Renal transplant, PVD, Carotid artery stenosis s/p right carotid endarterectomy, metastatic prostate cancerwho was admitted to [facility] on 03/20/2013 for evaluation of chest pain.  1) Chest pain, Hx of CAD 2) Hx of Diastolic CHF 3) Acute on chronic kidney disease s/p history of kidney transplant, baseline creatinine 1.6-1.8 4) Anion Gap Metabolic acidosis 5) Fever of Unknown origin 6) Metastatic prostate cancer 7) HTN 8) DM-2 9) Atrial Tachycardia 10) Anemia 11) Hyperkalemia  Plan: Acute on chronic kidney disease s/p history of kidney transplant, baseline creatinine 1.6-1.8 - Creatinine has continued to rise during admission despite both fluids and diuretics. - Patient was started on Diltiazem on 1/14.  This is a known inhibitor of Sirolimus metabolism.  This has likely lead to toxicity and the Acute renal failure. - Additional Sirolimus level ordered. - Obtaining Iron studies, PTH, and Urine Lytes. Repeating UA Do not feel vol is the issue.  Will check for Na avidity but doubt that is the issue.  This is more typical clinical, e'lyte, picture of toxic or viral injury.  Will eval both, suspect Sirolimus toxicity due to drug interaction , but need to look for other etio.  Fever of Unknown origin - Sirolimus toxicity could be contributing (due to increased immunosuppression). - Obtaining BK virus, EBV, and CMV PCR - Cultures negative thus far. - Chest xray today revealed ? Multifocal PNA vs Moderate Interstitial edema.  Anemia - Iron 17, % Saturation of 10 - IV  Feraheme and Aranesp ordered - Will monitor closely  Hyperkalemia & Metabolic Acidosis, AG - Additional Kayexalate ordered. - Starting patient on Bicarb 1300 mg BID - Will continue to monitor closely.    [place] PGY-2 I have seen and examined this patient and agree with the plan of care seen, eval, examined, counseled, talked with Med HS and Renal HS .  Dick Hark L 03/27/2013, 4:44 PM

## 2013-03-27 NOTE — Progress Notes (Signed)
Internal Medicine Attending  Date: 03/27/2013  Patient name: Joshua [name] Medical record number: [id number] Date of birth: 11/24/1950 Age: 63 y.o. Gender: male  I saw and evaluated the patient. I reviewed the resident's note by Dr. Mechele Claude and I agree with the resident's findings and plans as documented in her note.

## 2013-03-27 NOTE — Progress Notes (Addendum)
Patient: Joshua [name] / Admit Date: 03/20/2013 / Date of Encounter: 03/27/2013, 8:55 AM   Subjective  Had a "terrible night." Sweating and chills overnight. Reported coughing up blood which is new. Blood tinged sputum in cup at bedside. Tmax 99.2 overnight. No SOB, orthopnea or LEE. Denies melena, hematemesis, hematuria, hematochezia.  Initially reported no CP. Later this AM had recurrence of 10/10 sharp CP coming down to 4/10 with SL NTG and gradually improving. Objective   Telemetry: NSR. 5 beats NSVT. No further atrial tach for 24 hrs  Physical Exam: Blood pressure 127/65, pulse 60, temperature 98.8 F (37.1 C), temperature source Oral, resp. rate 18, height $RemoveBe'5\' 11"'DsOlkeFLT$  (1.803 m), weight 179 lb 10.8 oz (81.5 kg), SpO2 93.00%. General: Well developed AAM in no acute distress. Laying essentially flat in bed Head: Normocephalic, atraumatic, sclera non-icteric, no xanthomas, nares are without discharge. Neck: JVP not elevated. Lungs: Clear bilaterally to auscultation without wheezes, rales, or rhonchi. Breathing is unlabored. Heart: RRR S1 S2 without murmurs, rubs, or gallops.  Abdomen: Soft, non-tender, non-distended with normoactive bowel sounds. No rebound/guarding. Extremities: No clubbing or cyanosis. No edema. Distal pedal pulses are 2+ and equal bilaterally. Neuro: Alert and oriented X 3. Moves all extremities spontaneously. Psych:  Responds to questions appropriately with a normal affect.   Intake/Output Summary (Last 24 hours) at 03/27/13 0855 Last data filed at 03/26/13 2350  Gross per 24 hour  Intake   1560 ml  Output    650 ml  Net    910 ml    Inpatient Medications:  . aspirin  81 mg Oral Daily  . atenolol  50 mg Oral Daily  . atorvastatin  80 mg Oral q1800  . clopidogrel  75 mg Oral Q breakfast  . diltiazem  180 mg Oral BID  . feeding supplement (ENSURE COMPLETE)  237 mL Oral TID BM  . heparin  5,000 Units Subcutaneous Q8H  . insulin aspart  0-9 Units Subcutaneous  TID WC  . insulin glargine  24 Units Subcutaneous Daily  . isosorbide mononitrate  30 mg Oral Daily  . levothyroxine  150 mcg Oral Daily  . methocarbamol  500 mg Oral BID  . mycophenolate  720 mg Oral BID  . pantoprazole  40 mg Oral Daily  . regadenoson  0.4 mg Intravenous Once  . sirolimus  4 mg Oral Daily   Infusions:    Labs:  Recent Labs  03/26/13 0530 03/27/13 0430  NA 134* 134*  K 4.6 5.6*  CL 95* 94*  CO2 20 19  GLUCOSE 172* 261*  BUN 39* 44*  CREATININE 2.62* 2.42*  CALCIUM 7.7* 7.7*    Recent Labs  03/26/13 1120 03/27/13 0430  WBC 8.3 9.1  NEUTROABS 6.0  --   HGB 7.7* 7.7*  HCT 24.8* 25.0*  MCV 74.7* 74.4*  PLT 402* 395   Radiology/Studies:  Dg Chest 2 View 03/23/2013   CLINICAL DATA:  Chest pain with history of congestive heart failure  EXAM: CHEST  2 VIEW  COMPARISON:  03/20/2013  FINDINGS: Heart size upper normal. Mild vascular congestion has developed when compared to the prior study. There is bilateral perihilar peribronchial cuffing with indistinct bilateral perihilar vessels and mild hazy opacity in the perihilar regions bilaterally. There are no pleural effusions. Surgical clips in the thyroid bed again identified.  IMPRESSION: Findings suggest development of congestive heart failure with mild pulmonary edema.   Electronically Signed   By: Skipper Cliche M.D.   On: 03/23/2013 16:22  Nm Bone Scan Whole Body  03/03/2013   CLINICAL DATA:  Pain.  Prostate cancer.  EXAM: NUCLEAR MEDICINE WHOLE BODY BONE SCAN  TECHNIQUE: Whole body anterior and posterior images were obtained approximately 3 hours after intravenous injection of radiopharmaceutical.  COMPARISON:  Bone scan 10/26/2007.  CT 02/28/2013.  RADIOPHARMACEUTICALS:  26.0 mCi Technetium-99 MDP  FINDINGS: Bilateral renal atrophy with a transplanted kidney right pelvis. No focal bony abnormality. No evidence of bony metastatic disease.  IMPRESSION: No evidence of bony metastatic disease or focal bony  abnormality. Bilateral renal atrophy. Transplanted kidney right pelvis.   Electronically Signed   By: Marcello Moores  Register   On: 03/03/2013 14:02   US Renal  03/22/2013   CLINICAL DATA:  Acute kidney injury. Patient with bilateral renal transplant.  EXAM: RENAL/URINARY TRACT ULTRASOUND COMPLETE  COMPARISON:  CT, 02/28/2013  FINDINGS: Right Kidney:  Length: 7.4 cm. Kidney is echogenic. 15 mm cyst arises from the upper pole. There are probable additional tiny cysts. No hydronephrosis.  Left Kidney:  Native left kidney not visualized.  Transplant kidney: In the right lower quadrant to right upper pelvis, a transplant kidney is visualized. Is normal in overall size measuring 10.7 cm. It has normal parenchymal echogenicity. Small cyst arises from its lower pole measuring 1 cm. No other renal masses. No hydronephrosis.  Bladder:  Appears normal for degree of bladder distention.  IMPRESSION: 1. Transplant kidney is normal in appearance within a small lower pole cyst measuring 1 cm. 2. Atrophic native kidneys. Only the right was visualized. Is echogenic with at least 1 discrete small cysts. No hydronephrosis.   Electronically Signed   By: Lajean Manes M.D.   On: 03/22/2013 15:50   Nm Myocar Multi W/spect W/wall Motion / Ef  03/22/2013   CLINICAL DATA:  63yo AA man with PMH of HTN, DM2, CAD with stents, CAD, PVD s/p fem pop, prostate cancer treated with radiation, hx of thyroid cancer, h/o ESRD and kidney transplantation who presents for chest pain and back pain  EXAM: MYOCARDIAL IMAGING WITH SPECT (REST)  TECHNIQUE: Standard myocardial SPECT imaging was performed after resting intravenous injection of Tc-77m sestamibi. Quantitative gated imaging was also performed to evaluate left ventricular wall motion, and estimate left ventricular ejection fraction. As the patient's heart rate was 129 bpm when he presented for his "stress" images, no additional administration of Lexiscan was performed.  COMPARISON:  None.  FINDINGS:  The Image quality is good. There is mild interference from visceral racer uptake. The left ventricle is moderately to severely dilated. There is a moderate in size, moderate-to-severe in intensity inferior and inferolateral perfusion abnormality seen on both rest and "stress" images, possibly worse on the "stress" images. There is global hypokinesis of the left ventricle, with severe basal and mid inferior wall hypokinesis. EF 34%. The severity of LV dysfunction is out of proportion to the perfusion abnormality.  IMPRESSION: Interpretation of the study is severely limited by absence of a clear increase in coronary flow during acquisition of the "stress" images  There is evidence of severe ischemia at rest and/or scar in the right coronary artery distribution. This appears to be at least partly reversible.  The degree of LV dysfunction suggests multivessel CAD versus a component of superimposed nonischemic cardiomyopathy.  No previous studies are available for comparison   Electronically Signed   By: Sanda Klein   On: 03/22/2013 19:29   Nm Pulmonary Perf And Vent  03/20/2013   CLINICAL DATA:  Chest pain  EXAM: NUCLEAR  MEDICINE VENTILATION - PERFUSION LUNG SCAN  Views: Anterior, posterior, left lateral, right lateral, RPO, LPO, RAO, LAO -ventilation and perfusion  Radionuclide: Technetium 43m DTPA -ventilation; Technetium 29m macroaggregated albumin-perfusion  Dose:  40.0 mCi-ventilation; 6.0 mCi-perfusion  Route of administration: Inhalation -ventilation; intravenous -perfusion  COMPARISON:  Chest radiograph March 20, 2013  FINDINGS: Radiotracer uptake on the ventilation study is homogeneous and symmetric bilaterally.  Radiotracer uptake on the perfusion study is homogeneous and symmetric bilaterally.  There is no appreciable ventilation/perfusion mismatch.  IMPRESSION: No ventilation or perfusion abnormalities. Very low probability of pulmonary embolus.   Electronically Signed   By: Lowella Grip M.D.    On: 03/20/2013 16:15     Assessment and Plan  1. CAD s/p prior stenting, admitted with unstable angina - nuc 1/14 with inferior scar with mild peri-infarct ischemia, the plan is to intensify medical therapy as he is s/p kidney transplant and GFR low. Imdur started. No further CP. VQ negative for PE. Continue ASA if OK with primary team, statin. Consider changing atenolol to metoprolol given renal insufficiency. P2Y12 is 365 suggesting he is a Plavix nonresponder. Pt is unsure if he has had prior CVA - Epic lists that he had previously reported a "light" stroke at time of MI but he does not recall this (would preclude Effient use). Do not see this in prior discharge summaries. Will d/w MD in setting of new hemoptysis. 2. Atrial tachycardia - started on cardizem by EP - continue. Quiescent over the last 24 hrs.  3. Low-grade fever and hemoptysis in setting of immunosuppressive medications - workup per IM. 4. Acute on chronic kidney disease s/p kidney transplant - Cr slightly improved. Treatment of hyperkalemia per primary team.  5. Acute on chronic diastolic CHF - LV dysfunction with EF 45-50% by echo this admission. Currently appears euvolemic. 6. NSVT - consider changing atenolol to metoprolol given renal insufficiency as above. 7. Mild-mod AS by echo - follow clinically as outpatient. 8. Acute on chronic microcytic anemia - hemoccult stools. ? R/t hemoptysis. Anemia panel. Further per IM.  9. History of prostate CA with markedly elevated PSA - alk phos is also elevated. Further per IM. Recent NM scan negative for metastatic disease. 10. Hypothyroidism, status post papillary thyroid cancer removal - TSH elevated but free T4 low normal.  11. DM, uncontrolled - A1C 7.8 - Lantus/SSI.  Signed, Melina Copa PA-C  Seen and examined personally. Agree with above. Just came back from radiology. Personally viewed blood-tinged mucus in cup.   Agree with changing atenolol to metoprolol given his chronic  kidney disease.  We will continue with Plavix for now. He is not a candidate for Effient given stroke. Also with his recent hemoptysis, I would not like to give him any more potent platelet inhibition.   Chest pain could be pleuritic from cough. Continue to monitor. He is feeling better. Denies any further discomfort.  Peak T waves on EKG noted. Potassium level 5.6. Agree with Kayexalate.   Candee Furbish, MD  Spoke with internal medicine team.  Stopped diltiazem (which was started this admit). Can increase sirolomus concentrations.   Change metoprolol to 25 Q6. Can increase to 50 q6 if needed.   Aggressively treating hyperkalemia (T waves are peaked on repeat ECG as well). Stressed the importance of this.   Sharp CP ? From cough. ? PNA.   Candee Furbish, MD 03/27/2013

## 2013-03-27 NOTE — Progress Notes (Addendum)
Inpatient Diabetes Program Recommendations  AACE/ADA: New Consensus Statement on Inpatient Glycemic Control (2013)  Target Ranges:  Prepandial:   less than 140 mg/dL      Peak postprandial:   less than 180 mg/dL (1-2 hours)      Critically ill patients:  140 - 180 mg/dL     Results for Joshua [name], Joshua [name] (MRN [id number]) as of 03/27/2013 12:32  Ref. Range 03/26/2013 08:02 03/26/2013 11:51 03/26/2013 16:39 03/26/2013 20:27  Glucose-Capillary Latest Range: 70-99 mg/dL 164 (H) 200 (H) 270 (H) 150 (H)    Results for Joshua [name], Joshua [name] (MRN [id number]) as of 03/27/2013 12:32  Ref. Range 03/27/2013 08:05 03/27/2013 12:12  Glucose-Capillary Latest Range: 70-99 mg/dL 229 (H) 284 (H)     **Per records, patient takes the following insulin at home:  Lantus 24 units daily in the AM Humalog 6 units tid with meals   **MD- Please consider the following in-hospital insulin adjustments:   1. Increase Lantus to 28 units daily in the AM 2. Add 1/2 patient's home meal coverage dose- Novolog 3 units tid with meals   Will follow. Wyn Quaker RN, MSN, CDE Diabetes Coordinator Inpatient Diabetes Program Team Pager: [phone] (8a-10p)

## 2013-03-27 NOTE — Consult Note (Addendum)
[facility]  Total days of antibiotics o       Reason for Consult: fever, infiltrates on cxr in immunocomp host   Referring Physician: joines Principal Problem:   Unstable angina Active Problems:   Occlusion and stenosis of carotid artery without mention of cerebral infarction   Coronary artery disease   Hypertension   Dyslipidemia, goal LDL below 70   Hypothyroidism   Back pain   Prostate cancer   Protein-calorie malnutrition, severe   Chronic diastolic CHF (congestive heart failure)   Atrial tachycardia    HPI: Collan Schoenfeld is a 63 y.o. 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 He stated that his back and chest pain has been ongoing for several weeks but worsened to include shortness of breath and weakness. While hospitalized, he does have periodic episodes of non-sustained WCT, and atrial tachycardia for which cardiology is providing recommendations. He was febrile on day of admit but was not sustained until 1/17-1/18 when he has had recurrence of fever, nightsweats and productive cough. Initial infectious work up early in admission showed no bacteremia. Repeat cxr shows multifocal infiltrate concerning for infection. Primary team has asked for ID consultation.  Patient hs past hx of prostate ca treated 4-5 yr ago, recently getting back into care with hem/onc for evaluation of metastatic disease. he was noted to undergo imaging for prostate cancer in December. Mri of spine showed numerous bone mets, mostly affecting T12 . Abd/pelvis CT showed numerous tiny bilateral lower lung nodules. Patient does not appear to know the results of these tests  Past Medical History  Diagnosis Date  . Carotid artery occlusion   . ASCVD  (arteriosclerotic cardiovascular disease)   . CHF (congestive heart failure)   . Anginal pain     last chest pain was a couple of months ago-just lasted less than a minute  . Stroke     "light stroke" about time of MI  . Renal failure     s/p renal transplant  . Aortic stenosis     moderate AS by 06/2011 echo (Dr. Fransico Him)  . Coronary artery disease 03/2009    2 vessel s/p BMS to LAD in setting of NSTEMI, RCA chronically occluded  . PVD (peripheral vascular disease)     s/p fempop 1980's  . Hypothyroidism     s/p papillary thyroid CA excision  . Hypertension   . Hyperlipidemia   . Aortic stenosis     moderate by echo 06/2012  . Diastolic dysfunction   . Heart murmur   . Myocardial infarct 03/2009 X2  . Type II diabetes mellitus   . Prostate cancer     "radiation tx ~ 4-5 yr ago; cleared then came back a couple months ago" (03/20/2013)    Allergies:  Allergies  Allergen Reactions  . Fentanyl     "Almost died"   . Propofol     "Almost died"     MEDICATIONS: . aspirin  81 mg Oral Daily  . atorvastatin  80 mg Oral q1800  . calcium gluconate  1 g Intravenous Once  . clopidogrel  75 mg Oral Q breakfast  . dextrose  1 ampule Intravenous Once  . diltiazem  180 mg Oral BID  .  feeding supplement (ENSURE COMPLETE)  237 mL Oral TID BM  . heparin  5,000 Units Subcutaneous Q8H  . insulin aspart  0-9 Units Subcutaneous TID WC  . insulin aspart  10 Units Subcutaneous Once  . insulin glargine  24 Units Subcutaneous Daily  . isosorbide mononitrate  30 mg Oral Daily  . levothyroxine  150 mcg Oral Daily  . methocarbamol  500 mg Oral BID  . metoprolol succinate  50 mg Oral Daily  . mycophenolate  720 mg Oral BID  . pantoprazole  40 mg Oral Daily  . regadenoson  0.4 mg Intravenous Once  . sirolimus  4 mg Oral Daily    History  Substance Use Topics  . Smoking status: Never Smoker   . Smokeless tobacco: Never Used  . Alcohol Use: No    Family History  Problem Relation Age  of Onset  . Diabetes Mother   . Hyperlipidemia Mother   . Hypertension Mother   . Heart disease Mother     before age 55  . Hyperlipidemia Father   . Hypertension Father   . Heart disease Father   . Hyperlipidemia Sister   . Hypertension Sister   . Diabetes Brother   . Hypertension Brother   . Hyperlipidemia Brother     Review of Systems  Constitutional: Negative for fever, chills, diaphoresis, activity change, appetite change, fatigue and unexpected weight change.  HENT: Negative for congestion, sore throat, rhinorrhea, sneezing, trouble swallowing and sinus pressure.  Eyes: Negative for photophobia and visual disturbance.  Respiratory: Negative for cough, chest tightness, shortness of breath, wheezing and stridor.  Cardiovascular: Negative for chest pain, palpitations and leg swelling.  Gastrointestinal: Negative for nausea, vomiting, abdominal pain, diarrhea, constipation, blood in stool, abdominal distention and anal bleeding.  Genitourinary: Negative for dysuria, hematuria, flank pain and difficulty urinating.  Musculoskeletal: Negative for myalgias, back pain, joint swelling, arthralgias and gait problem.  Skin: Negative for color change, pallor, rash and wound.  Neurological: Negative for dizziness, tremors, weakness and light-headedness.  Hematological: Negative for adenopathy. Does not bruise/bleed easily.  Psychiatric/Behavioral: Negative for behavioral problems, confusion, sleep disturbance, dysphoric mood, decreased concentration and agitation.     OBJECTIVE: Temp:  [98.4 F (36.9 C)-99.2 F (37.3 C)] 98.4 F (36.9 C) (01/19 1220) Pulse Rate:  [60-75] 64 (01/19 1220) Resp:  [18] 18 (01/19 1220) BP: (127-169)/(58-67) 134/67 mmHg (01/19 1220) SpO2:  [93 %-98 %] 96 % (01/19 1220)  Constitutional: He is oriented to person, place, and time. He appears well-developed and well-nourished. No distress.  HENT:  Mouth/Throat: Oropharynx is clear and moist. No  oropharyngeal exudate.  Cardiovascular: Normal rate, regular rhythm and normal heart sounds. Exam reveals no gallop and no friction rub.  No murmur heard.  Pulmonary/Chest: Effort normal and breath sounds normal. No respiratory distress. Wheezing noted on anterior lung fields R>L  Abdominal: Soft. Bowel sounds are normal. He exhibits no distension. There is no tenderness.  Lymphadenopathy:  He has no cervical adenopathy.  Neurological: He is alert and oriented to person, place, and time.  Skin: Skin is warm and dry. No rash noted. No erythema.  Psychiatric: He has a normal mood and affect. His behavior is normal.  Ext: left arm prior  AVF   LABS: Results for orders placed during the hospital encounter of 03/20/13 (from the past 48 hour(s))  GLUCOSE, CAPILLARY     Status: Abnormal   Collection Time    03/25/13  5:09 PM      Result Value  Range   Glucose-Capillary 110 (*) 70 - 99 mg/dL  GLUCOSE, CAPILLARY     Status: Abnormal   Collection Time    03/25/13  9:18 PM      Result Value Range   Glucose-Capillary 151 (*) 70 - 99 mg/dL  BASIC METABOLIC PANEL     Status: Abnormal   Collection Time    03/26/13  5:30 AM      Result Value Range   Sodium 134 (*) 137 - 147 mEq/L   Potassium 4.6  3.7 - 5.3 mEq/L   Chloride 95 (*) 96 - 112 mEq/L   CO2 20  19 - 32 mEq/L   Glucose, Bld 172 (*) 70 - 99 mg/dL   BUN 39 (*) 6 - 23 mg/dL   Creatinine, Ser 2.62 (*) 0.50 - 1.35 mg/dL   Calcium 7.7 (*) 8.4 - 10.5 mg/dL   GFR calc non Af Amer 25 (*) >90 mL/min   GFR calc Af Amer 29 (*) >90 mL/min   Comment: (NOTE)     The eGFR has been calculated using the CKD EPI equation.     This calculation has not been validated in all clinical situations.     eGFR's persistently <90 mL/min signify possible Chronic Kidney     Disease.  GLUCOSE, CAPILLARY     Status: Abnormal   Collection Time    03/26/13  8:02 AM      Result Value Range   Glucose-Capillary 164 (*) 70 - 99 mg/dL  CBC WITH DIFFERENTIAL      Status: Abnormal   Collection Time    03/26/13 11:20 AM      Result Value Range   WBC 8.3  4.0 - 10.5 K/uL   RBC 3.32 (*) 4.22 - 5.81 MIL/uL   Hemoglobin 7.7 (*) 13.0 - 17.0 g/dL   HCT 24.8 (*) 39.0 - 52.0 %   MCV 74.7 (*) 78.0 - 100.0 fL   MCH 23.2 (*) 26.0 - 34.0 pg   MCHC 31.0  30.0 - 36.0 g/dL   RDW 18.3 (*) 11.5 - 15.5 %   Platelets 402 (*) 150 - 400 K/uL   Neutrophils Relative % 72  43 - 77 %   Lymphocytes Relative 22  12 - 46 %   Monocytes Relative 5  3 - 12 %   Eosinophils Relative 1  0 - 5 %   Basophils Relative 0  0 - 1 %   Neutro Abs 6.0  1.7 - 7.7 K/uL   Lymphs Abs 1.8  0.7 - 4.0 K/uL   Monocytes Absolute 0.4  0.1 - 1.0 K/uL   Eosinophils Absolute 0.1  0.0 - 0.7 K/uL   Basophils Absolute 0.0  0.0 - 0.1 K/uL   Smear Review MORPHOLOGY UNREMARKABLE    GLUCOSE, CAPILLARY     Status: Abnormal   Collection Time    03/26/13 11:51 AM      Result Value Range   Glucose-Capillary 200 (*) 70 - 99 mg/dL   Comment 1 Notify RN    GLUCOSE, CAPILLARY     Status: Abnormal   Collection Time    03/26/13  4:39 PM      Result Value Range   Glucose-Capillary 270 (*) 70 - 99 mg/dL   Comment 1 Notify RN    GLUCOSE, CAPILLARY     Status: Abnormal   Collection Time    03/26/13  8:27 PM      Result Value Range   Glucose-Capillary 150 (*) 70 -  99 mg/dL  BASIC METABOLIC PANEL     Status: Abnormal   Collection Time    03/27/13  4:30 AM      Result Value Range   Sodium 134 (*) 137 - 147 mEq/L   Potassium 5.6 (*) 3.7 - 5.3 mEq/L   Comment: DELTA CHECK NOTED     NO VISIBLE HEMOLYSIS   Chloride 94 (*) 96 - 112 mEq/L   CO2 19  19 - 32 mEq/L   Glucose, Bld 261 (*) 70 - 99 mg/dL   BUN 44 (*) 6 - 23 mg/dL   Creatinine, Ser 2.42 (*) 0.50 - 1.35 mg/dL   Calcium 7.7 (*) 8.4 - 10.5 mg/dL   GFR calc non Af Amer 27 (*) >90 mL/min   GFR calc Af Amer 32 (*) >90 mL/min   Comment: (NOTE)     The eGFR has been calculated using the CKD EPI equation.     This calculation has not been validated in  all clinical situations.     eGFR's persistently <90 mL/min signify possible Chronic Kidney     Disease.  CBC     Status: Abnormal   Collection Time    03/27/13  4:30 AM      Result Value Range   WBC 9.1  4.0 - 10.5 K/uL   RBC 3.36 (*) 4.22 - 5.81 MIL/uL   Hemoglobin 7.7 (*) 13.0 - 17.0 g/dL   HCT 25.0 (*) 39.0 - 52.0 %   MCV 74.4 (*) 78.0 - 100.0 fL   MCH 22.9 (*) 26.0 - 34.0 pg   MCHC 30.8  30.0 - 36.0 g/dL   RDW 18.4 (*) 11.5 - 15.5 %   Platelets 395  150 - 400 K/uL  GLUCOSE, CAPILLARY     Status: Abnormal   Collection Time    03/27/13  8:05 AM      Result Value Range   Glucose-Capillary 229 (*) 70 - 99 mg/dL   Comment 1 Notify RN     Comment 2 Documented in Chart    RETICULOCYTES     Status: Abnormal   Collection Time    03/27/13  9:30 AM      Result Value Range   Retic Ct Pct 1.3  0.4 - 3.1 %   RBC. 3.35 (*) 4.22 - 5.81 MIL/uL   Retic Count, Manual 43.6  19.0 - 186.0 K/uL  TROPONIN I     Status: None   Collection Time    03/27/13  9:30 AM      Result Value Range   Troponin I <0.30  <0.30 ng/mL   Comment:            Due to the release kinetics of cTnI,     a negative result within the first hours     of the onset of symptoms does not rule out     myocardial infarction with certainty.     If myocardial infarction is still suspected,     repeat the test at appropriate intervals.  CK TOTAL AND CKMB     Status: None   Collection Time    03/27/13  9:30 AM      Result Value Range   Total CK 99  7 - 232 U/L   CK, MB 1.2  0.3 - 4.0 ng/mL   Relative Index RELATIVE INDEX IS INVALID  0.0 - 2.5   Comment: WHEN CK < 100 U/L  GLUCOSE, CAPILLARY     Status: Abnormal   Collection Time    03/27/13 12:12 PM      Result Value Range   Glucose-Capillary 284 (*) 70 - 99 mg/dL  BASIC METABOLIC PANEL     Status: Abnormal   Collection Time    03/27/13  1:00 PM      Result Value Range   Sodium 133 (*) 137 - 147 mEq/L   Potassium 6.1 (*) 3.7 - 5.3 mEq/L   Comment: NO  VISIBLE HEMOLYSIS   Chloride 92 (*) 96 - 112 mEq/L   CO2 20  19 - 32 mEq/L   Glucose, Bld 313 (*) 70 - 99 mg/dL   BUN 50 (*) 6 - 23 mg/dL   Creatinine, Ser 2.60 (*) 0.50 - 1.35 mg/dL   Calcium 7.7 (*) 8.4 - 10.5 mg/dL   GFR calc non Af Amer 25 (*) >90 mL/min   GFR calc Af Amer 29 (*) >90 mL/min   Comment: (NOTE)     The eGFR has been calculated using the CKD EPI equation.     This calculation has not been validated in all clinical situations.     eGFR's persistently <90 mL/min signify possible Chronic Kidney     Disease.    MICRO: 1/13 blood cx ngtd IMAGING: Dg Chest 2 View  03/27/2013   CLINICAL DATA:  Cough, hemoptysis  EXAM: CHEST  2 VIEW  COMPARISON:  03/23/2013  FINDINGS: Multifocal patchy airspace opacities, most prominent in the right perihilar region, mildly increased. Differential considerations include mild to moderate interstitial edema or possibly multifocal infection.  Suspected small left pleural effusion.  No pneumothorax.  Cardiomegaly.  Surgical clips overlying the bilateral neck.  IMPRESSION: Multifocal patchy airspace opacities, most prominent in the right perihilar region, mildly increased. Differential considerations include mild to moderate interstitial edema or possibly multifocal infection.  Cardiomegaly with suspected small left pleural effusion.   Electronically Signed   By: Julian Hy M.D.   On: 03/27/2013 13:00     Assessment/Plan:  63yo M with hx of renal allograft in 2001 on IS of sirolimus and mycophenalate found to have intermittent fevers nad multifocal patchy airspace disease on chest xray. Atypical pathogens include: nocardia, aspergillus in addition to hospital acquired pathogens but also may be due to metastatic prostate cancer  - please get noncontrast chest ct to compare against the lower field cuts of lungs on the a/p ct done in dec 2014. - would repeat infectious work up with sputum culture, blood culture, ua, and urine cx - please consult  Pulmonology for evaluation of doing bronchoscopy to get BAL vs. bx of lung nodules if amenable for diagnostic purposes. - please get pneumocystitis stain - please collect respiratory virus panel - will order aspergillus ab and B-D glucan (fungitell) assay to be collected in the morning - please send sputum for aerobic, fungal, and AFB culture - can hold off on starting antibiotics, if he worsens clinically, would start vancomycin and cefepime to cover HCAP   Back pain = likely from metastatic prostate ca. Recommend primary team to touch base with his oncologist to see what they are doing next for the patient, convey info to them. Consider repeat imaging to see if patient is suffering from pathologic fracture that could be causing his back pain.  Elzie Rings [facility] [phone]

## 2013-03-28 ENCOUNTER — Inpatient Hospital Stay (HOSPITAL_COMMUNITY): Payer: Medicare Other

## 2013-03-28 DIAGNOSIS — R918 Other nonspecific abnormal finding of lung field: Secondary | ICD-10-CM

## 2013-03-28 DIAGNOSIS — R042 Hemoptysis: Secondary | ICD-10-CM

## 2013-03-28 DIAGNOSIS — D649 Anemia, unspecified: Secondary | ICD-10-CM | POA: Diagnosis present

## 2013-03-28 DIAGNOSIS — R195 Other fecal abnormalities: Secondary | ICD-10-CM

## 2013-03-28 LAB — BASIC METABOLIC PANEL
BUN: 50 mg/dL — AB (ref 6–23)
BUN: 49 mg/dL — AB (ref 6–23)
CALCIUM: 7.7 mg/dL — AB (ref 8.4–10.5)
CALCIUM: 7.9 mg/dL — AB (ref 8.4–10.5)
CO2: 19 meq/L (ref 19–32)
CO2: 20 meq/L (ref 19–32)
CREATININE: 2.53 mg/dL — AB (ref 0.50–1.35)
Chloride: 88 mEq/L — ABNORMAL LOW (ref 96–112)
Chloride: 88 mEq/L — ABNORMAL LOW (ref 96–112)
Creatinine, Ser: 2.5 mg/dL — ABNORMAL HIGH (ref 0.50–1.35)
GFR calc Af Amer: 30 mL/min — ABNORMAL LOW (ref >90)
GFR calc Af Amer: 30 mL/min — ABNORMAL LOW (ref >90)
GFR calc non Af Amer: 26 mL/min — ABNORMAL LOW (ref >90)
GFR, EST NON AFRICAN AMERICAN: 26 mL/min — AB (ref >90)
GLUCOSE: 365 mg/dL — AB (ref 70–99)
GLUCOSE: 255 mg/dL — AB (ref 70–99)
Potassium: 5.3 mEq/L (ref 3.7–5.3)
Potassium: 4.8 mEq/L (ref 3.7–5.3)
SODIUM: 131 meq/L — AB (ref 137–147)
Sodium: 130 mEq/L — ABNORMAL LOW (ref 137–147)

## 2013-03-28 LAB — RESPIRATORY VIRUS PANEL
ADENOVIRUS: NOT DETECTED
INFLUENZA A: NOT DETECTED
Influenza A H1: NOT DETECTED
Influenza A H3: NOT DETECTED
Influenza B: NOT DETECTED
Metapneumovirus: NOT DETECTED
PARAINFLUENZA 1 A: NOT DETECTED
Parainfluenza 2: NOT DETECTED
Parainfluenza 3: NOT DETECTED
RESPIRATORY SYNCYTIAL VIRUS B: NOT DETECTED
Respiratory Syncytial Virus A: NOT DETECTED
Rhinovirus: NOT DETECTED

## 2013-03-28 LAB — URINE CULTURE
Colony Count: NO GROWTH
Culture: NO GROWTH

## 2013-03-28 LAB — CBC WITH DIFFERENTIAL/PLATELET
BASOS PCT: 0 % (ref 0–1)
Basophils Absolute: 0 10*3/uL (ref 0.0–0.1)
Eosinophils Absolute: 0 10*3/uL (ref 0.0–0.7)
Eosinophils Relative: 0 % (ref 0–5)
HCT: 18.1 % — ABNORMAL LOW (ref 39.0–52.0)
Hemoglobin: 5.7 g/dL — CL (ref 13.0–17.0)
LYMPHS ABS: 1.4 10*3/uL (ref 0.7–4.0)
Lymphocytes Relative: 13 % (ref 12–46)
MCH: 23 pg — AB (ref 26.0–34.0)
MCHC: 31.5 g/dL (ref 30.0–36.0)
MCV: 73 fL — ABNORMAL LOW (ref 78.0–100.0)
MONO ABS: 0.9 10*3/uL (ref 0.1–1.0)
Monocytes Relative: 8 % (ref 3–12)
NEUTROS PCT: 79 % — AB (ref 43–77)
Neutro Abs: 8.7 10*3/uL — ABNORMAL HIGH (ref 1.7–7.7)
PLATELETS: 363 10*3/uL (ref 150–400)
RBC: 2.48 MIL/uL — ABNORMAL LOW (ref 4.22–5.81)
RDW: 18.7 % — ABNORMAL HIGH (ref 11.5–15.5)
WBC MORPHOLOGY: INCREASED
WBC: 11 10*3/uL — ABNORMAL HIGH (ref 4.0–10.5)

## 2013-03-28 LAB — GLUCOSE, CAPILLARY
GLUCOSE-CAPILLARY: 308 mg/dL — AB (ref 70–99)
GLUCOSE-CAPILLARY: 242 mg/dL — AB (ref 70–99)
Glucose-Capillary: 211 mg/dL — ABNORMAL HIGH (ref 70–99)

## 2013-03-28 LAB — BK VIRUS QUANT PCR, URINE: BK virus DNA, quant PCR: <500 copies/mL (ref <500)

## 2013-03-28 LAB — HEMOGLOBIN AND HEMATOCRIT, BLOOD
HEMATOCRIT: 20.7 % — AB (ref 39.0–52.0)
HEMATOCRIT: 25.9 % — AB (ref 39.0–52.0)
HEMOGLOBIN: 6.5 g/dL — AB (ref 13.0–17.0)
HEMOGLOBIN: 8.1 g/dL — AB (ref 13.0–17.0)

## 2013-03-28 LAB — PARATHYROID HORMONE, INTACT (NO CA): PTH: 78.5 pg/mL — ABNORMAL HIGH (ref 14.0–72.0)

## 2013-03-28 LAB — EPSTEIN BARR VRS(EBV DNA BY PCR): EBV DNA QN by PCR: <500 copies/mL (ref <500)

## 2013-03-28 LAB — PREPARE RBC (CROSSMATCH)

## 2013-03-28 LAB — TROPONIN I

## 2013-03-28 MED ORDER — INSULIN GLARGINE 100 UNIT/ML ~~LOC~~ SOLN
28.0000 [IU] | Freq: Every day | SUBCUTANEOUS | Status: DC
  Administered 2013-03-29 – 2013-04-02 (×4): 28 [IU] via SUBCUTANEOUS
  Filled 2013-03-28 (×6): qty 0.28

## 2013-03-28 MED ORDER — DEXTROSE 5 % IV SOLN: 2.0000 g | INTRAVENOUS | Status: DC

## 2013-03-28 MED ORDER — VANCOMYCIN HCL IN DEXTROSE 1-5 GM/200ML-% IV SOLN
1000.0000 mg | Freq: Once | INTRAVENOUS | Status: DC
  Filled 2013-03-28: qty 200

## 2013-03-28 MED ORDER — VANCOMYCIN HCL IN DEXTROSE 1-5 GM/200ML-% IV SOLN
1000.0000 mg | INTRAVENOUS | Status: DC
  Administered 2013-03-28 – 2013-03-30 (×3): 1000 mg via INTRAVENOUS
  Filled 2013-03-28 (×4): qty 200

## 2013-03-28 MED ORDER — DEXTROSE 5 % IV SOLN
2.0000 g | INTRAVENOUS | Status: DC
  Administered 2013-03-28 – 2013-03-30 (×3): 2 g via INTRAVENOUS
  Filled 2013-03-28 (×4): qty 2

## 2013-03-28 MED ORDER — DEXTROSE 5 % IV SOLN
2.0000 g | Freq: Once | INTRAVENOUS | Status: DC
  Filled 2013-03-28: qty 2

## 2013-03-28 MED ORDER — ONDANSETRON 4 MG PO TBDP
4.0000 mg | ORAL_TABLET | Freq: Once | ORAL | Status: AC
  Administered 2013-03-28: 4 mg via ORAL
  Filled 2013-03-28: qty 1

## 2013-03-28 MED ORDER — SIROLIMUS 1 MG PO TABS
3.0000 mg | ORAL_TABLET | Freq: Every day | ORAL | Status: DC
  Administered 2013-03-29 – 2013-03-30 (×2): 3 mg via ORAL
  Filled 2013-03-28 (×2): qty 3

## 2013-03-28 MED ORDER — VANCOMYCIN HCL IN DEXTROSE 1-5 GM/200ML-% IV SOLN: 1000.0000 mg | INTRAVENOUS | Status: DC

## 2013-03-28 NOTE — Progress Notes (Signed)
Pt had a couple episodes of tachycardia sustaining in the 130's . MD made aware and came to see the patient. Metoprolol was given. After speaking with the MD an order was placed to transfer to [facility]. Report was called to receiving RN and patient was transferred.

## 2013-03-28 NOTE — Progress Notes (Signed)
Internal Medicine Attending  Date: 03/28/2013  Patient name: Joshua [name] Medical record number: [id number] Date of birth: 05-Sep-1950 Age: 63 y.o. Gender: male  I saw and evaluated the patient, and discussed his care on A.M rounds with housestaff.  I reviewed the resident's note by Dr. Mechele Claude and I agree with the resident's findings and plans as documented in her note.

## 2013-03-28 NOTE — Progress Notes (Signed)
[facility]    Date of Admission:  03/20/2013   Total days of antibiotics 1           ID: Joshua [name] is a 63 y.o. male who is an immunocompromised host found to have bilateral infiltrates but also hemoptysis concern for pna vs. Pulmonary aveolar hemorrhage Principal Problem:   Unstable angina Active Problems:   Occlusion and stenosis of carotid artery without mention of cerebral infarction   Coronary artery disease   Hypertension   Dyslipidemia, goal LDL below 70   Hypothyroidism   Back pain   Prostate cancer   Protein-calorie malnutrition, severe   Chronic diastolic CHF (congestive heart failure)   Atrial tachycardia    Subjective: Poor sleep. Had loose stools from kayexelate, receiving blood transfusion. Still having some blood in his sputum  Medications:  . aspirin  81 mg Oral Daily  . atorvastatin  80 mg Oral q1800  . ceFEPime (MAXIPIME) IV  2 g Intravenous Q24H  . feeding supplement (ENSURE COMPLETE)  237 mL Oral TID BM  . insulin aspart  0-9 Units Subcutaneous TID WC  . [START ON 03/29/2013] insulin glargine  28 Units Subcutaneous Daily  . isosorbide mononitrate  30 mg Oral Daily  . levothyroxine  150 mcg Oral Daily  . methocarbamol  500 mg Oral BID  . metoprolol tartrate  25 mg Oral Q6H  . mycophenolate  720 mg Oral BID  . pantoprazole  40 mg Oral Daily  . regadenoson  0.4 mg Intravenous Once  . [START ON 03/29/2013] sirolimus  3 mg Oral Daily  . sodium bicarbonate  1,300 mg Oral BID  . vancomycin  1,000 mg Intravenous Q24H    Objective: Vital signs in last 24 hours: Temp:  [98 F (36.7 C)-100 F (37.8 C)] 98.7 F (37.1 C) (01/20 1545) Pulse Rate:  [82-98] 88 (01/20 1545) Resp:  [16-22] 18 (01/20 1545) BP: (125-166)/(50-75) 137/50 mmHg (01/20 1545) SpO2:  [91 %-95 %] 94 % (01/20 1545)  Constitutional: He is oriented to person, place, and time. He appears well-developed and well-nourished. No distress.  HENT:    Mouth/Throat: Oropharynx is clear and moist. No oropharyngeal exudate.  Cardiovascular: Normal rate, regular rhythm and normal heart sounds. Exam reveals no gallop and no friction rub.  No murmur heard.  Pulmonary/Chest: Effort normal and breath sounds normal. No respiratory distress. Bilateral wheezing Abdominal: Soft. Bowel sounds are normal. He exhibits no distension. There is no tenderness.  Lymphadenopathy:  He has no cervical adenopathy.  Neurological: He is alert and oriented to person, place, and time.  Skin: Skin is warm and dry. No rash noted. No erythema.  Psychiatric: He has a normal mood and affect. His behavior is normal.    Lab Results  Recent Labs  03/27/13 0430  03/28/13 0620 03/28/13 0815 03/28/13 1040  WBC 9.1  --  11.0*  --   --   HGB 7.7*  --  5.7* 6.5*  --   HCT 25.0*  --  18.1* 20.7*  --   NA 134*  < > 130*  --  131*  K 5.6*  < > 5.3  --  4.8  CL 94*  < > 88*  --  88*  CO2 19  < > 19  --  20  BUN 44*  < > 50*  --  49*  CREATININE 2.42*  < > 2.50*  --  2.53*  < > = values in this interval not displayed.  Liver Panel  Recent Labs  03/27/13 1300  PROT 7.5  ALBUMIN 2.1*  AST 45*  ALT 26  ALKPHOS 385*  BILITOT 0.3  BILIDIR <0.2  IBILI NOT CALCULATED   Sedimentation Rate No results found for this basename: ESRSEDRATE,  in the last 72 hours C-Reactive Protein No results found for this basename: CRP,  in the last 72 hours  Microbiology: pending Studies/Results: Dg Chest 2 View  03/27/2013   CLINICAL DATA:  Cough, hemoptysis  EXAM: CHEST  2 VIEW  COMPARISON:  03/23/2013  FINDINGS: Multifocal patchy airspace opacities, most prominent in the right perihilar region, mildly increased. Differential considerations include mild to moderate interstitial edema or possibly multifocal infection.  Suspected small left pleural effusion.  No pneumothorax.  Cardiomegaly.  Surgical clips overlying the bilateral neck.  IMPRESSION: Multifocal patchy airspace  opacities, most prominent in the right perihilar region, mildly increased. Differential considerations include mild to moderate interstitial edema or possibly multifocal infection.  Cardiomegaly with suspected small left pleural effusion.   Electronically Signed   By: Julian Hy M.D.   On: 03/27/2013 13:00   Ct Chest Wo Contrast  03/27/2013   CLINICAL DATA:  Chest pain and shortness of breath for several months. Low-grade fever. Acute renal failure in transplant patient.  EXAM: CT CHEST WITHOUT CONTRAST  TECHNIQUE: Multidetector CT imaging of the chest was performed following the standard protocol without IV contrast.  COMPARISON:  Chest radiograph performed earlier today at 11:45 a.m.  FINDINGS: Diffuse fluffy bilateral central airspace opacity is seen, most prominent at the upper lung lobes bilaterally. This is most compatible with diffuse pneumonia. Underlying edema cannot be excluded, given underlying small bilateral pleural effusions and interstitial prominence. No pneumothorax is seen.  Scattered coronary artery calcifications are seen. The heart is mildly enlarged. A mildly prominent right paratracheal node is seen, measuring 1.3 cm in short axis. No pericardial effusion is identified. The great vessels are grossly unremarkable in appearance. The patient is status post thyroidectomy. No axillary lymphadenopathy is seen.  The visualized portions of the liver and the spleen are unremarkable in appearance. Severe bilateral renal atrophy is seen, with scattered calcifications noted bilaterally. A small hiatal hernia is noted.  IMPRESSION: 1. Diffuse fluffy bilateral central airspace opacification, most prominent at the upper lung lobes. This is most compatible with diffuse multifocal pneumonia. Underlying edema cannot be excluded, given small bilateral pleural effusions and interstitial prominence. 2. Scattered coronary calcifications seen. 3. Mild cardiomegaly. 4. Mildly prominent right paratracheal  node, measuring 1.3 cm in short axis. 5. Severe bilateral renal atrophy, with scattered associated calcifications. 6. Small hiatal hernia seen.   Electronically Signed   By: Garald Balding M.D.   On: 03/27/2013 21:48     Assessment/Plan: hcap vs. Other pulm pathology = continue with vanco and cefepime. Await for cutlure results to determine if need to change antibiotics. Anticipate 8 day course of therapy unless PsA or MRSA is found. If patient has ongoing hemoptysis, will discuss with pulmonology benefits of bronchoscopy.  Baxter Flattery [facility] for Infect[facility] Diseases Cell: [phone] Pager: [phone]  03/28/2013, 4:07 PM

## 2013-03-28 NOTE — Evaluation (Addendum)
Occupational Therapy Evaluation Patient Details Name: Joshua [name] MRN: [id number] DOB: 04-02-1950 Today's Date: 03/28/2013 Time: 9449-6759 OT Time Calculation (min): 24 min  OT Assessment / Plan / Recommendation History of present illness 63 y.o. male admitted to [facility] on 03/20/13 with PMH of HTN, DM2, CAD with stents, CAD, PVD s/p fem pop, prostate cancer treated with radiation, hx of thyroid cancer, h/o ESRD and kidney transplantation who presents for chest pain and back pain.  The chest pain start 3 days ago at rest, it is left sided, substernal, 8/10 at the start, throbbing and sharp, non radiating, possibly worse with breathing, however, definitely worse with palpation.  The pain happened every few hours.  He was intermittently tachycardic in the ED ranging from the 80s-140.  Cardiology was consulted in the ED.  Patient further reports back pain which has been slowly worsening over past few months.  He has had work up in the outpatient setting with a bone scan and MRI lumbar spine as there was concern for bony metastasis of his prostate cancer.  Found to have HCAP, GIB with acute blood loss s/p blood transfusion, acute on chronic kidney failure, back pain with possible bone mets.     Clinical Impression   Pt presents with below problem list. Pt independent with ADLs, PTA. Feel pt will benefit from acute OT to increase independence prior to d/c.     OT Assessment  Patient needs continued OT Services    Follow Up Recommendations  Home health OT;Supervision - Intermittent (when OOB/mobility)    Barriers to Discharge      Equipment Recommendations  3 in 1 commode   Recommendations for Other Services    Frequency  Min 2X/week    Precautions / Restrictions Restrictions Weight Bearing Restrictions: No  Monitor O2 sats  Pertinent Vitals/Pain No pain reported. Educated on deep breathing as pt out of breath during session- OT's pulse oximeter not reading well.     ADL  Grooming: Teeth  care;Min guard Where Assessed - Grooming: Supported standing Upper Body Dressing: Set up;Supervision/safety Where Assessed - Upper Body Dressing: Supported sitting Lower Body Dressing: Min guard Where Assessed - Lower Body Dressing: Supported sit to Lobbyist: Magazine features editor Method: Sit to Loss adjuster, chartered: Regular height toilet;Grab bars Tub/Shower Transfer Method: Not assessed Equipment Used: Gait belt;Rolling walker;Other (comment) (Oxygen) Transfers/Ambulation Related to ADLs: Min guard ADL Comments: Educated on energy conservation techniques. Recommended sitting for bathing and dressing. Recommended someone being with pt getting in and out of tub. OT demonstrated tub transfer technique. Educated on chair push ups to increase UE strength.    OT Diagnosis: Generalized weakness  OT Problem List: Decreased strength;Decreased activity tolerance;Impaired balance (sitting and/or standing);Decreased knowledge of use of DME or AE;Cardiopulmonary status limiting activity;Decreased cognition OT Treatment Interventions: Self-care/ADL training;Therapeutic exercise;Energy conservation;DME and/or AE instruction;Therapeutic activities;Balance training;Patient/family education   OT Goals(Current goals can be found in the care plan section) Acute Rehab OT Goals Patient Stated Goal: not stated OT Goal Formulation: With patient Time For Goal Achievement: 04/04/13 Potential to Achieve Goals: Good ADL Goals Pt Will Perform Lower Body Bathing: with modified independence;sit to/from stand Pt Will Perform Lower Body Dressing: with modified independence;sit to/from stand Pt Will Transfer to Toilet: with modified independence;ambulating;regular height toilet;grab bars Pt Will Perform Toileting - Clothing Manipulation and hygiene: with modified independence;sit to/from stand Pt Will Perform Tub/Shower Transfer: Tub transfer;with supervision;rolling walker;ambulating (tub  equipment tbd) Additional ADL Goal #1: Pt will independently verbalize and  demonstrate 3/3 energy conservation techniques. Additional ADL Goal #2: Pt will independently perform HEP for bilateral UE's to increase strength.    Visit Information  Last OT Received On: 03/28/13 Assistance Needed: +1 History of Present Illness: 63 y.o. male admitted to [facility] on 03/20/13 with PMH of HTN, DM2, CAD with stents, CAD, PVD s/p fem pop, prostate cancer treated with radiation, hx of thyroid cancer, h/o ESRD and kidney transplantation who presents for chest pain and back pain.  The chest pain start 3 days ago at rest, it is left sided, substernal, 8/10 at the start, throbbing and sharp, non radiating, possibly worse with breathing, however, definitely worse with palpation.  The pain happened every few hours.  He was intermittently tachycardic in the ED ranging from the 80s-140.  Cardiology was consulted in the ED.  Patient further reports back pain which has been slowly worsening over past few months.  He has had work up in the outpatient setting with a bone scan and MRI lumbar spine as there was concern for bony metastasis of his prostate cancer.  Found to have HCAP, GIB with acute blood loss s/p blood transfusion, acute on chronic kidney failure, back pain with possible bone mets.         Prior [place] expects to be discharged to:: Private residence Living Arrangements: Spouse/significant other;Other relatives;Children Available Help at Discharge: Family;Available 24 hours/day (wife uses a cane herself and cannot provide physical assist.) Type of Home: House Home Access: Stairs to enter [employer] of Steps: 3 Entrance Stairs-Rails: Left Home Layout: One level Home Equipment: None Prior Function Level of Independence: Independent Comments: Per pt he has never been this weak and needed to use an assistive device. He was also not on home O2 PTA.    Communication Communication: No difficulties         Vision/Perception Vision - History Patient Visual Report: No change from baseline   Cognition  Cognition Arousal/Alertness: Awake/alert Behavior During Therapy: WFL for tasks assessed/performed Overall Cognitive Status: Impaired/Different from baseline Area of Impairment: Problem solving Problem Solving: Slow processing    Extremity/Trunk Assessment Upper Extremity Assessment Upper Extremity Assessment: Generalized weakness Lower Extremity Assessment Lower Extremity Assessment: Defer to PT evaluation Cervical / Trunk Assessment Cervical / Trunk Assessment: Normal     Mobility  Transfers Overall transfer level: Needs assistance Equipment used: Rolling walker (2 wheeled) Transfers: Sit to/from Stand Sit to Stand: Min guard General transfer comment: cues for hand placement.     Exercise        End of Session OT - End of Session Equipment Utilized During Treatment: Gait belt;Rolling walker;Oxygen Activity Tolerance: Patient limited by fatigue Patient left: in chair;with call bell/phone within reach;with family/visitor present Nurse Communication: Other (comment) (pt up in chair; told tech pt coughing up blood)  GO     Benito Mccreedy OTR/L [phone] 03/28/2013, 5:47 PM

## 2013-03-28 NOTE — Progress Notes (Signed)
NUTRITION FOLLOW UP  DOCUMENTATION CODES  Per approved criteria   -Severe malnutrition in the context of chronic illness    Intervention:    Continue Ensure Complete PO TID, each supplement provides an additional 350 kcals and 13 grams protein per 8 fl oz. Bottle.  Nutrition Dx:   Inadequate oral intake related to chronic illness as evidenced by weight loss and decreased intake. Ongoing.  Goal:   Intake to meet >90% of estimated nutrition needs. Unmet, progressing.  Monitor:   PO intake, labs, weight trend.  Assessment:   Patient with PMH of HTN, DM-II, CAD, hx of paillary thyroid cancer, kidney transplantation, carotid artery stenosis, PVD, and prostate cancer. Patient presents with chest and back pain. 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  Patient reports ongoing poor appetite and poor oral intake. Lunch tray at bedside untouched, patient says he is just not hungry for anything right now. Has been drinking Ensure supplements TID.   Height: Ht Readings from Last 1 Encounters:  03/25/13 5\' 11"  (1.803 m)    Weight Status:   Wt Readings from Last 1 Encounters:  03/26/13 179 lb 10.8 oz (81.5 kg)  03/21/13  171 lb 14.4 oz (77.973 kg)   Re-estimated needs:  Kcal: 2400-2600  Protein: 120-140 grams  Fluid: 1.2 L  Skin: no wounds  Diet Order: Carb Control with Ensure Complete PO TID   Intake/Output Summary (Last 24 hours) at 03/28/13 1404 Last data filed at 03/28/13 1328  Gross per 24 hour  Intake 1107.5 ml  Output   1200 ml  Net  -92.5 ml    Last BM: 1/20   Labs:   Recent Labs Lab 03/22/13 1357  03/27/13 2025 03/28/13 0620 03/28/13 1040  NA 134*  < > 135* 130* 131*  K 4.5  < > 5.2 5.3 4.8  CL 97  < > 92* 88* 88*  CO2 16*  < > 21 19 20   BUN 28*  < > 52* 50* 49*  CREATININE 1.96*  < > 2.67* 2.50* 2.53*  CALCIUM 8.7  < > 8.5 7.7* 7.9*  PHOS 3.2  --   --   --   --   GLUCOSE 155*  < > 274* 365* 255*   < > = values in this interval not displayed.  CBG (last 3)   Recent Labs  03/27/13 2040 03/28/13 0756 03/28/13 1158  GLUCAP 277* 308* 211*    Scheduled Meds: . aspirin  81 mg Oral Daily  . atorvastatin  80 mg Oral q1800  . ceFEPime (MAXIPIME) IV  2 g Intravenous Q24H  . feeding supplement (ENSURE COMPLETE)  237 mL Oral TID BM  . insulin aspart  0-9 Units Subcutaneous TID WC  . insulin glargine  24 Units Subcutaneous Daily  . isosorbide mononitrate  30 mg Oral Daily  . levothyroxine  150 mcg Oral Daily  . methocarbamol  500 mg Oral BID  . metoprolol tartrate  25 mg Oral Q6H  . mycophenolate  720 mg Oral BID  . pantoprazole  40 mg Oral Daily  . regadenoson  0.4 mg Intravenous Once  . [START ON 03/29/2013] sirolimus  3 mg Oral Daily  . sodium bicarbonate  1,300 mg Oral BID  . vancomycin  1,000 mg Intravenous Q24H    Continuous Infusions: none   Molli Barrows, RD, LDN, [place] Pager [phone] After Hours Pager [phone]

## 2013-03-28 NOTE — Consult Note (Addendum)
Name: Joshua [name] MRN: [id number] DOB: [birth year]    ADMISSION DATE:  03/20/2013 CONSULTATION DATE: 1-20  REFERRING MD :  FPTS PRIMARY SERVICE: FPTS  CHIEF COMPLAINT:  Back pain  BRIEF PATIENT DESCRIPTION:   63 yo never smoker who was admitted 1-12 with chest pain and evaluated per cards. He further developed a cough, hemoptysis (approx 50 cc) and radiographic evidence of bilateral pna, with sudden drop in Hb from 8.9 to 5.7 on 1/20 with rectal bleeding C/o diarrhea with kayexalate followed by rectal bleeding C/o jerking movments PMH of HTN, DM2, CAD with stents on Plavix, CAD with AS- AVA 1.5 cm2, PVD s/p fem pop, prostate cancer treated with radiation ? Bone mets. Hx of thyroid cancer, h/o ESRD and kidney transplantation 60 y ago  SIGNIFICANT EVENTS / STUDIES:    LINES / TUBES:   CULTURES: 119 bcx2>> 1-19 sputum>> 1-19 uc>> 1-19 resp virus>>  ANTIBIOTICS: 1-20 vanc>> 1-20 cefepime>>  HISTORY OF PRESENT ILLNESS:   63 yo with 13 hx of renal transplant, cad, back pain who was admitted 1-12 with chest pain and evaluated per cards. He further developed a cough, hemoptysis (approx 50 cc) and radiographic evidence of bilateral pna. Due to his extensive PMH and immunosuppression PCCM asked to evaluate. He has just been started on Vanc/cefepime and is in no distress.  PAST MEDICAL HISTORY :  Past Medical History  Diagnosis Date  . Carotid artery occlusion   . ASCVD (arteriosclerotic cardiovascular disease)   . CHF (congestive heart failure)   . Anginal pain     last chest pain was a couple of months ago-just lasted less than a minute  . Stroke     "light stroke" about time of MI  . Renal failure     s/p renal transplant  . Aortic stenosis     moderate AS by 06/2011 echo (Dr. Fransico Him)  . Coronary artery disease 03/2009    2 vessel s/p BMS to LAD in setting of NSTEMI, RCA chronically occluded  . PVD (peripheral vascular disease)     s/p fempop 1980's  .  Hypothyroidism     s/p papillary thyroid CA excision  . Hypertension   . Hyperlipidemia   . Aortic stenosis     moderate by echo 06/2012  . Diastolic dysfunction   . Heart murmur   . Myocardial infarct 03/2009 X2  . Type II diabetes mellitus   . Prostate cancer     "radiation tx ~ 4-5 yr ago; cleared then came back a couple months ago" (03/20/2013)   Past Surgical History  Procedure Laterality Date  . Nephrectomy transplanted organ Bilateral 2001  . Pr vein bypass graft,aorto-fem-pop  1980    fem pop   . Endarterectomy  08/25/2011    Procedure: ENDARTERECTOMY CAROTID;  Surgeon: Angelia Mould, MD;  Location: [facility];  Service: Vascular;  Laterality: Right;  . Thyroidectomy    . Coronary angioplasty with stent placement  03/2009    "1"   Prior to Admission medications   Medication Sig Start Date End Date Taking? Authorizing Provider  amLODipine (NORVASC) 10 MG tablet Take 1 tablet (10 mg total) by mouth daily. 12/08/12  Yes Sueanne Margarita, MD  aspirin 81 MG chewable tablet Chew 81 mg by mouth daily.     Yes Historical Provider, MD  atenolol (TENORMIN) 50 MG tablet Take 50 mg by mouth daily.   Yes Historical Provider, MD  atorvastatin (LIPITOR) 80 MG tablet TAKE 1 TABLET BY  MOUTH EVERY DAY 03/08/13  Yes Sueanne Margarita, MD  clopidogrel (PLAVIX) 75 MG tablet Take 1 tablet (75 mg total) by mouth daily. 12/08/12  Yes Sueanne Margarita, MD  insulin glargine (LANTUS) 100 UNIT/ML injection Inject 24 Units into the skin at bedtime.    Yes Historical Provider, MD  insulin lispro (HUMALOG) 100 UNIT/ML injection Inject 6 Units into the skin 3 (three) times daily as needed (for sugars over 280).    Yes Historical Provider, MD  levothyroxine (SYNTHROID, LEVOTHROID) 150 MCG tablet Take 150 mcg by mouth daily.   Yes Historical Provider, MD  methocarbamol (ROBAXIN) 500 MG tablet Take 1 tablet (500 mg total) by mouth 2 (two) times daily. 02/09/13  Yes Billy Fischer, MD  mycophenolate (MYFORTIC) 180 MG EC  tablet Take 720 mg by mouth 2 (two) times daily.    Yes Historical Provider, MD  oxyCODONE-acetaminophen (PERCOCET) 10-325 MG per tablet Take 1 tablet by mouth every 4 (four) hours as needed for pain. 03/11/13  Yes Glendell Docker, NP  pantoprazole (PROTONIX) 40 MG tablet Take 40 mg by mouth daily.   Yes Historical Provider, MD  sirolimus (RAPAMUNE) 1 MG tablet Take 4 mg by mouth daily.    Yes Historical Provider, MD   Allergies  Allergen Reactions  . Fentanyl     "Almost died"   . Propofol     "Almost died"     FAMILY HISTORY:  Family History  Problem Relation Age of Onset  . Diabetes Mother   . Hyperlipidemia Mother   . Hypertension Mother   . Heart disease Mother     before age 51  . Hyperlipidemia Father   . Hypertension Father   . Heart disease Father   . Hyperlipidemia Sister   . Hypertension Sister   . Diabetes Brother   . Hypertension Brother   . Hyperlipidemia Brother    SOCIAL HISTORY:  reports that he has never smoked. He has never used smokeless tobacco. He reports that he does not drink alcohol or use illicit drugs.  REVIEW OF SYSTEMS:   Constitutional: negative for anorexia, fevers and sweats  Eyes: negative for irritation, redness and visual disturbance  Ears, nose, mouth, throat, and face: negative for earaches, epistaxis, nasal congestion and sore throat  Respiratory: negative for cough, dyspnea on exertion, sputum and wheezing  Cardiovascular: negative for chest pain, dyspnea, lower extremity edema, orthopnea, palpitations and syncope  Gastrointestinal: negative for abdominal pain, constipation,melena, nausea and vomiting  Genitourinary:negative for dysuria, frequency and hematuria  Hematologic/lymphatic: negative for bleeding, easy bruising and lymphadenopathy  Musculoskeletal:negative for arthralgias, muscle weakness and stiff joints  Neurological: negative for coordination problems, gait problems, headaches and weakness  Endocrine: negative for  diabetic symptoms including polydipsia, polyuria and weight loss    SUBJECTIVE:   VITAL SIGNS: Temp:  [98.4 F (36.9 C)-100 F (37.8 C)] 98.9 F (37.2 C) (01/20 0402) Pulse Rate:  [58-98] 98 (01/20 0402) Resp:  [18] 18 (01/20 0402) BP: (103-166)/(43-72) 148/54 mmHg (01/20 0402) SpO2:  [91 %-96 %] 91 % (01/20 0402) HEMODYNAMICS:   VENTILATOR SETTINGS:   INTAKE / OUTPUT: Intake/Output     01/19 0701 - 01/20 0700 01/20 0701 - 01/21 0700   P.O. 1455    I.V. (mL/kg) 110 (1.3)    Total Intake(mL/kg) 1565 (19.2)    Urine (mL/kg/hr) 800 (0.4)    Total Output 800     Net +765          Stool Occurrence 4 x  PHYSICAL EXAMINATION: General:  WNWDAAM, NAD at rest Neuro:  Intact HEENT: No lan/JVD poor dentation Cardiovascular:  HSR RRR Lungs:  Mild wheezes and rhonchi bilat Abdomen:  Rt kidney transpalnt noted, +bs Musculoskeletal:  Intact Skin:  Warm, multiple av fistulas with left bicep +bruit thrill  LABS:  CBC  Recent Labs Lab 03/26/13 1120 03/27/13 0430 03/28/13 0620 03/28/13 0815  WBC 8.3 9.1 11.0*  --   HGB 7.7* 7.7* 5.7* 6.5*  HCT 24.8* 25.0* 18.1* 20.7*  PLT 402* 395 363  --    Coag's No results found for this basename: APTT, INR,  in the last 168 hours BMET  Recent Labs Lab 03/27/13 1725 03/27/13 2025 03/28/13 0620  NA 135* 135* 130*  K 5.4* 5.2 5.3  CL 93* 92* 88*  CO2 22 21 19   BUN 52* 52* 50*  CREATININE 2.71* 2.67* 2.50*  GLUCOSE 279* 274* 365*   Electrolytes  Recent Labs Lab 03/22/13 1357  03/27/13 1725 03/27/13 2025 03/28/13 0620  CALCIUM 8.7  < > 8.2* 8.5 7.7*  PHOS 3.2  --   --   --   --   < > = values in this interval not displayed. Sepsis Markers  Recent Labs Lab 03/24/13 1110  LATICACIDVEN 1.1   ABG  Recent Labs Lab 03/22/13 1730  PHART 7.404  PCO2ART 31.3*  PO2ART 76.3*   Liver Enzymes  Recent Labs Lab 03/22/13 1357 03/27/13 1300  AST  --  45*  ALT  --  26  ALKPHOS  --  385*  BILITOT  --  0.3    ALBUMIN 2.2* 2.1*   Cardiac Enzymes  Recent Labs Lab 03/27/13 0930 03/27/13 1615 03/27/13 2310  TROPONINI <0.30 <0.30 <0.30  PROBNP  --  19450.0*  --    Glucose  Recent Labs Lab 03/27/13 1212 03/27/13 1504 03/27/13 1615 03/27/13 1813 03/27/13 2040 03/28/13 0756  GLUCAP 284* 266* 265* 263* 277* 308*    Imaging Dg Chest 2 View  03/27/2013   CLINICAL DATA:  Cough, hemoptysis  EXAM: CHEST  2 VIEW  COMPARISON:  03/23/2013  FINDINGS: Multifocal patchy airspace opacities, most prominent in the right perihilar region, mildly increased. Differential considerations include mild to moderate interstitial edema or possibly multifocal infection.  Suspected small left pleural effusion.  No pneumothorax.  Cardiomegaly.  Surgical clips overlying the bilateral neck.  IMPRESSION: Multifocal patchy airspace opacities, most prominent in the right perihilar region, mildly increased. Differential considerations include mild to moderate interstitial edema or possibly multifocal infection.  Cardiomegaly with suspected small left pleural effusion.   Electronically Signed   By: Julian Hy M.D.   On: 03/27/2013 13:00   Ct Chest Wo Contrast  03/27/2013   CLINICAL DATA:  Chest pain and shortness of breath for several months. Low-grade fever. Acute renal failure in transplant patient.  EXAM: CT CHEST WITHOUT CONTRAST  TECHNIQUE: Multidetector CT imaging of the chest was performed following the standard protocol without IV contrast.  COMPARISON:  Chest radiograph performed earlier today at 11:45 a.m.  FINDINGS: Diffuse fluffy bilateral central airspace opacity is seen, most prominent at the upper lung lobes bilaterally. This is most compatible with diffuse pneumonia. Underlying edema cannot be excluded, given underlying small bilateral pleural effusions and interstitial prominence. No pneumothorax is seen.  Scattered coronary artery calcifications are seen. The heart is mildly enlarged. A mildly prominent  right paratracheal node is seen, measuring 1.3 cm in short axis. No pericardial effusion is identified. The great vessels are grossly unremarkable in  appearance. The patient is status post thyroidectomy. No axillary lymphadenopathy is seen.  The visualized portions of the liver and the spleen are unremarkable in appearance. Severe bilateral renal atrophy is seen, with scattered calcifications noted bilaterally. A small hiatal hernia is noted.  IMPRESSION: 1. Diffuse fluffy bilateral central airspace opacification, most prominent at the upper lung lobes. This is most compatible with diffuse multifocal pneumonia. Underlying edema cannot be excluded, given small bilateral pleural effusions and interstitial prominence. 2. Scattered coronary calcifications seen. 3. Mild cardiomegaly. 4. Mildly prominent right paratracheal node, measuring 1.3 cm in short axis. 5. Severe bilateral renal atrophy, with scattered associated calcifications. 6. Small hiatal hernia seen.   Electronically Signed   By: Garald Balding M.D.   On: 03/27/2013 21:48     CXR: see above  ASSESSMENT / PLAN:  PULMONARY A: Bilat opacities CW  pna in immunocompromised host Hemoptysis suspicious for pulmonary alveolar hemorrhage P:   Abx noted No role currently for invasive pulmonary procedures Hemoptysis is improving with holding plavix If continues will need to proceed with bscopy & lavage  CARDIOVASCULAR A:  CAD PVD CHF Chest Pain P:  Per Cards  RENAL A:  Post renal transplant 13 years ago P:   Per renal  GASTROINTESTINAL A: No acute issue P:     HEMATOLOGIC A:  Hx of prostate ca and thyroid ca with questionable new bone mets.  Anemia P:  Per IM Tx per IM Holding anticoagulants.   INFECTIOUS A:  Presumed HCAP P:  Cefepime/ vanc   ENDOCRINE A:  Hypothroid  P:   Per IM  NEUROLOGIC A: Involuntary movments P:  ? Related to toxicity of immunosuppressants ? Drug interactions   TODAY'S SUMMARY:  63 yo  renal transplant with sweats, hemoptysis and interstitial infiltrates ? Edema vs alveolar hemorrhage - very suspicious in v/o Hb drop out of proportion to degree of rectal bleeding reported The patient indicates understanding of these issues and agrees with the plan. With holding plavix & see if this resolves, if does not , may need bscopy  Addendum - On review, sirolimus has been associated with pulmonary hemorrhage - do we have to consider stopping or lowering doses?  Kara Mead MD. [place] Flood. [facility] Pager [phone] If no response call [phone]   03/28/2013, 11:19 AM

## 2013-03-28 NOTE — Consult Note (Addendum)
[facility] Consult: 2:04 PM 03/28/2013  LOS: 8 days    Referring Provider: Dr Delsa Bern Primary Care Physician:  Has not PMD!  Goes to a lot of specialists, goes to urgent care prn Primary Gastroenterologist:  Althia Forts, none     Reason for Consultation:  Anemia. Limited rectal bleeding.    HPI: Joshua [name] is a 63 y.o. male.  Host of medical problems. PMH of HTN, DM2, CAD with stents, chronic Plavix, CAD, PVD s/p fem pop, prostate cancer treated with radiation.  Hx of thyroid cancer, h/o ESRD and kidney transplantation.  Admitted 8 days ago with chest and back pain.  Xrays show pneumonia. Felt to have Sirolimus toxicity and associated ARF. Apparently a recent MRI is showing mets to spine but I am unable to locate the report. An bone scan of 03/03/13 showed no mets.   Received Kayexalate yesterday for hyperkalemia and after several loose stools saw some red blood passing on 3 occasions, it resolved after that. Never sees blood in stool at home.  Some nausea and minor bilious emesis last night, also resolved. Plavix placed on hold. Hgb was 8.9 with MCV 75 in 08/2011, 8.6/73 on 03/20/13, down to 5..7 today, recheck measured 6.5 today so one unit PRBC ordered and transfusing now.    No anorexia, no GERD sxs, no abdominal pain.  Normally has BMs every other day.  No dyspnea.  Says he has lost about 30 # in last few months.  No ETOH.  No NSAIDs, ASA. Never transfused or had anemia.     Past Medical History  Diagnosis Date  . Carotid artery occlusion   . ASCVD (arteriosclerotic cardiovascular disease)   . CHF (congestive heart failure)   . Anginal pain     last chest pain was a couple of months ago-just lasted less than a minute  . Stroke     "light stroke" about time of MI  . Renal failure     s/p renal  transplant  . Aortic stenosis     moderate AS by 06/2011 echo (Dr. Fransico Him)  . Coronary artery disease 03/2009    2 vessel s/p BMS to LAD in setting of NSTEMI, RCA chronically occluded  . PVD (peripheral vascular disease)     s/p fempop 1980's  . Hypothyroidism     s/p papillary thyroid CA excision  . Hypertension   . Hyperlipidemia   . Aortic stenosis     moderate by echo 06/2012  . Diastolic dysfunction   . Heart murmur   . Myocardial infarct 03/2009 X2  . Type II diabetes mellitus   . Prostate cancer     "radiation tx ~ 4-5 yr ago; cleared then came back a couple months ago" (03/20/2013)    Past Surgical History  Procedure Laterality Date  . Nephrectomy transplanted organ Bilateral 2001  . Pr vein bypass graft,aorto-fem-pop  1980    fem pop   . Endarterectomy  08/25/2011    Procedure: ENDARTERECTOMY CAROTID;  Surgeon: Angelia Mould, MD;  Location: [place];  Service: Vascular;  Laterality: Right;  . Thyroidectomy    . Coronary angioplasty with stent placement  03/2009    "1"    Prior to Admission medications   Medication Sig Start Date End Date Taking? Authorizing Provider  amLODipine (NORVASC) 10 MG tablet Take 1 tablet (10 mg total) by mouth daily. 12/08/12  Yes Sueanne Margarita, MD  aspirin 81 MG chewable tablet Chew 81 mg by mouth daily.     Yes Historical Provider, MD  atenolol (TENORMIN) 50 MG tablet Take 50 mg by mouth daily.   Yes Historical Provider, MD  atorvastatin (LIPITOR) 80 MG tablet TAKE 1 TABLET BY MOUTH EVERY DAY 03/08/13  Yes Sueanne Margarita, MD  clopidogrel (PLAVIX) 75 MG tablet Take 1 tablet (75 mg total) by mouth daily. 12/08/12  Yes Sueanne Margarita, MD  insulin glargine (LANTUS) 100 UNIT/ML injection Inject 24 Units into the skin at bedtime.    Yes Historical Provider, MD  insulin lispro (HUMALOG) 100 UNIT/ML injection Inject 6 Units into the skin 3 (three) times daily as needed (for sugars over 280).    Yes Historical Provider, MD  levothyroxine  (SYNTHROID, LEVOTHROID) 150 MCG tablet Take 150 mcg by mouth daily.   Yes Historical Provider, MD  methocarbamol (ROBAXIN) 500 MG tablet Take 1 tablet (500 mg total) by mouth 2 (two) times daily. 02/09/13  Yes Billy Fischer, MD  mycophenolate (MYFORTIC) 180 MG EC tablet Take 720 mg by mouth 2 (two) times daily.    Yes Historical Provider, MD  oxyCODONE-acetaminophen (PERCOCET) 10-325 MG per tablet Take 1 tablet by mouth every 4 (four) hours as needed for pain. 03/11/13  Yes Glendell Docker, NP  pantoprazole (PROTONIX) 40 MG tablet Take 40 mg by mouth daily.   Yes Historical Provider, MD  sirolimus (RAPAMUNE) 1 MG tablet Take 4 mg by mouth daily.    Yes Historical Provider, MD    Scheduled Meds: . aspirin  81 mg Oral Daily  . atorvastatin  80 mg Oral q1800  . ceFEPime (MAXIPIME) IV  2 g Intravenous Q24H  . feeding supplement (ENSURE COMPLETE)  237 mL Oral TID BM  . insulin aspart  0-9 Units Subcutaneous TID WC  . insulin glargine  24 Units Subcutaneous Daily  . isosorbide mononitrate  30 mg Oral Daily  . levothyroxine  150 mcg Oral Daily  . methocarbamol  500 mg Oral BID  . metoprolol tartrate  25 mg Oral Q6H  . mycophenolate  720 mg Oral BID  . pantoprazole  40 mg Oral Daily  . regadenoson  0.4 mg Intravenous Once  . [START ON 03/29/2013] sirolimus  3 mg Oral Daily  . sodium bicarbonate  1,300 mg Oral BID  . vancomycin  1,000 mg Intravenous Q24H   Infusions:   PRN Meds: sodium chloride, oxyCODONE, sodium chloride   Allergies as of 03/20/2013 - Review Complete 03/20/2013  Allergen Reaction Noted  . Fentanyl  08/11/2011  . Propofol  08/11/2011    Family History  Problem Relation Age of Onset  . Diabetes Mother   . Hyperlipidemia Mother   . Hypertension Mother   . Heart disease Mother     before age 20  . Hyperlipidemia Father   . Hypertension Father   . Heart disease Father   . Hyperlipidemia Sister   . Hypertension Sister   . Diabetes Brother   . Hypertension Brother     . Hyperlipidemia Brother         No colon cancer or PUD.  No anemia.   History   Social History  . Marital Status: Married    Spouse Name: N/A    Number of Children: N/A  . Years of Education: N/A   Occupational History  . disabled   Social History Main Topics  . Smoking status: Never Smoker   . Smokeless tobacco: Never Used  . Alcohol Use: No  . Drug Use: No  . Sexual Activity: Yes   Social History Narrative  . No narrative on file    REVIEW OF SYSTEMS: Constitutional:  No fatigue.  Able to function independently at home ENT:  No nose bleeds Pulm:  No SOB or cough.  CV:  No palpitations, no LE edema.  GU:  No hematuria, no frequency GI:  Per HPI Heme:  Per HPI   Transfusions:  Per HPI Neuro:  No headaches, no peripheral tingling or numbness Derm:  No itching, no rash or sores.  Endocrine:  No sweats or chills.  No polyuria or dysuria Immunization:  Not queried Travel:  None beyond local counties in last few months.    PHYSICAL EXAM: Vital signs in last 24 hours: Filed Vitals:   03/28/13 1355  BP: 126/60  Pulse: 82  Temp: 99 F (37.2 C)  Resp: 20   Wt Readings from Last 3 Encounters:  03/26/13 81.5 kg (179 lb 10.8 oz)  03/11/13 86.183 kg (190 lb)  01/11/13 86.183 kg (190 lb)    General: looks chronically ill, comfortable Head:  No swelling, no trauma  Eyes:  No icterus or pallor Ears:  Slightly HOH  Nose:  No congestion,  Mouth:  Moist mm, poor dentition. Neck:  No mass, no JVD. Lungs:  Diminished but clear Heart: RRR.  No MRG Abdomen:  Soft, no mass, no HSM, no bruits.  Not tender.   Rectal: medium brown stool is FOB + .  No mass or hemorrhoids  Musc/Skeltl: no joint swelling of deformity Extremities:  No pedal edema.  Old AVG in arms  Neurologic:  Oriented x 3, no tremor Skin:  Punctate sores on legs, not infected Tattoos:  none Nodes:  No cervical or inguinal adenopathy.    Psych:  Pleasant, relaxed, appropriate.   Intake/Output from  previous day: 01/19 0701 - 01/20 0700 In: 1565 [P.O.:1455; I.V.:110] Out: 800 [Urine:800] Intake/Output this shift: Total I/O In: 262.5 [I.V.:250; Blood:12.5] Out: 400 [Urine:400]  LAB RESULTS:  Recent Labs  03/26/13 1120 03/27/13 0430 03/28/13 0620 03/28/13 0815  WBC 8.3 9.1 11.0*  --   HGB 7.7* 7.7* 5.7* 6.5*  HCT 24.8* 25.0* 18.1* 20.7*  PLT 402* 395 363  --    BMET Lab Results  Component Value Date   NA 131* 03/28/2013   NA 130* 03/28/2013   NA 135* 03/27/2013   K 4.8 03/28/2013   K 5.3 03/28/2013   K 5.2 03/27/2013   CL 88* 03/28/2013   CL 88* 03/28/2013   CL 92* 03/27/2013   CO2 20 03/28/2013   CO2 19 03/28/2013   CO2 21 03/27/2013   GLUCOSE 255* 03/28/2013   GLUCOSE 365* 03/28/2013   GLUCOSE 274* 03/27/2013   BUN 49* 03/28/2013   BUN 50* 03/28/2013   BUN 52* 03/27/2013   CREATININE 2.53* 03/28/2013   CREATININE 2.50* 03/28/2013   CREATININE 2.67* 03/27/2013   CALCIUM 7.9* 03/28/2013   CALCIUM 7.7* 03/28/2013   CALCIUM 8.5 03/27/2013   LFT  Recent Labs  03/27/13 1300  PROT 7.5  ALBUMIN 2.1*  AST 45*  ALT 26  ALKPHOS 385*  BILITOT 0.3  BILIDIR <0.2  IBILI NOT CALCULATED   PT/INR Lab Results  Component Value Date   INR 1.14 03/20/2013   INR 1.02 08/19/2011   INR 1.08 03/25/2009    RADIOLOGY STUDIES: Dg Chest 2 View 03/27/2013   CLINICAL DATA:  Cough, hemoptysis  EXAM: CHEST  2 VIEW  COMPARISON:  03/23/2013  FINDINGS: Multifocal patchy airspace opacities, most prominent in the right perihilar region, mildly increased. Differential considerations include mild to moderate interstitial edema or possibly multifocal infection.  Suspected small left pleural effusion.  No pneumothorax.  Cardiomegaly.  Surgical clips overlying the bilateral neck.  IMPRESSION: Multifocal patchy airspace opacities, most prominent in the right perihilar region, mildly increased. Differential considerations include mild to moderate interstitial edema or possibly multifocal infection.   Cardiomegaly with suspected small left pleural effusion.   Electronically Signed   By: Charline Bills M.D.   On: 03/27/2013 13:00   Ct Chest Wo Contrast 03/27/2013     FINDINGS: Diffuse fluffy bilateral central airspace opacity is seen, most prominent at the upper lung lobes bilaterally. This is most compatible with diffuse pneumonia. Underlying edema cannot be excluded, given underlying small bilateral pleural effusions and interstitial prominence. No pneumothorax is seen.  Scattered coronary artery calcifications are seen. The heart is mildly enlarged. A mildly prominent right paratracheal node is seen, measuring 1.3 cm in short axis. No pericardial effusion is identified. The great vessels are grossly unremarkable in appearance. The patient is status post thyroidectomy. No axillary lymphadenopathy is seen.  The visualized portions of the liver and the spleen are unremarkable in appearance. Severe bilateral renal atrophy is seen, with scattered calcifications noted bilaterally. A small hiatal hernia is noted.  IMPRESSION: 1. Diffuse fluffy bilateral central airspace opacification, most prominent at the upper lung lobes. This is most compatible with diffuse multifocal pneumonia. Underlying edema cannot be excluded, given small bilateral pleural effusions and interstitial prominence. 2. Scattered coronary calcifications seen. 3. Mild cardiomegaly. 4. Mildly prominent right paratracheal node, measuring 1.3 cm in short axis. 5. Severe bilateral renal atrophy, with scattered associated calcifications. 6. Small hiatal hernia seen.   Electronically Signed   By: Roanna Raider M.D.   On: 03/27/2013 21:48    ENDOSCOPIC STUDIES: None ever  IMPRESSION:   *  Microcytic anemia, chronic.  Drop of nearly 3 grams over 7 days. Transfusing one of one units currently.   *  Limited rectal bleeding occurred after kayexelate caused copious diarrhea. Resolved.  Amount of rectal bleeding does not seem sufficient to  explain the level of anemia.  Rule out hemorrhoidal, rule out rectal/anal irritation from the diarrhea.  Rule out radiation proctitis.   *  Diffuse multifocal PNA. ID following, immunosuppressed.   *  CAD.  03/2009 non STEMI: s/p BMS to LAD, chronically occluded RCA. On chronic Plavix, this now on hold as of today.   acute diastolic and systolic heart failure. EF 45 to 50%  *  S/p renal transplant 2001. Chronic immunosuppressives. Addition of Diltiazem 1/14 felt to have led to Sirolimus toxicity and ARF.   *  Prostate cancer. Initial diagnoses ~ 2000/2001. Treated with radiation.  Per pt, Dr Feliz Beam and resident's notes pt has T spine mets.  I can not find studies to support this diagnoses of mets. Pt says he is scheduled for MRI with biopsy today, again I am unable to find this in Epic.    *  IDDM.   PLAN:     *  Needs colonoscopy and EGD but last dose Plavix was 1/19.  Ideally we pursue these studies after holding Plavix 5 days. Defer decision to Dr Olevia Perches.   Azucena Freed  03/28/2013, 2:04 PM Pager: [phone] Attending MD note:   I have taken a history, examined the patient, and reviewed the chart. I agree with the Advanced Practitioner's impression and recommendations. Heme positive  Microcytic anemia in the setting of low volume hematochezia, suspect multifactorial causes for anemia.The earliest he could have EGD/colon would  5 days off Plavix, on Friday 03/31/2013. Will follow with you and consider endoscopic studies pending other subspecialty tests.  [facility] Pager # [phone]

## 2013-03-28 NOTE — Evaluation (Signed)
Physical Therapy Evaluation Patient Details Name: Joshua [name] MRN: [id number] DOB: [birth year] Today's Date: 03/28/2013 Time: 5852-7782 PT Time Calculation (min): 39 min  PT Assessment / Plan / Recommendation History of Present Illness  63 y.o. male admitted to [facility] on 03/20/13 with PMH of HTN, DM2, CAD with stents, CAD, PVD s/p fem pop, prostate cancer treated with radiation, hx of thyroid cancer, h/o ESRD and kidney transplantation who presents for chest pain and back pain.  The chest pain start 3 days ago at rest, it is left sided, substernal, 8/10 at the start, throbbing and sharp, non radiating, possibly worse with breathing, however, definitely worse with palpation.  The pain happened every few hours.  He was intermittently tachycardic in the ED ranging from the 80s-140.  Cardiology was consulted in the ED.  Patient further reports back pain which has been slowly worsening over past few months.  He has had work up in the outpatient setting with a bone scan and MRI lumbar spine as there was concern for bony metastasis of his prostate cancer.  Found to have HCAP, GIB with acute blood loss s/p blood transfusion, acute on chronic kidney failure, back pain with possible bone mets.    Clinical Impression  Pt is generally weak and deconditioned and at this time cannot even be on RA at rest without O2 sats dropping to 88% or below.  He relies on the support of the RW for gait (did not use one PTA) and would benefit from HHPT and RW at discharge as long as he continues to move well.   PT to follow acutely for deficits listed below.       PT Assessment  Patient needs continued PT services    Follow Up Recommendations  Home health PT;Supervision for mobility/OOB    Does the patient have the potential to tolerate intense rehabilitation     NA  Barriers to Discharge   None      Equipment Recommendations  Rolling walker with 5" wheels    Recommendations for Other Services Other (comment) (Is  this pt appropriate for hospice/pallitive care?)   Frequency Min 3X/week    Precautions / Restrictions   Monitor O2 sats with gait.    Pertinent Vitals/Pain O2 RA at rest 88% after 3 mins off.  3 L O2 [place] applied for gait and O2 sats only maintained at 90% with DOE 2/4.        Mobility  Bed Mobility Overal bed mobility: Modified Independent General bed mobility comments: uses railing to pull to sitting.  Transfers Overall transfer level: Needs assistance Equipment used: Rolling walker (2 wheeled) Transfers: Sit to/from Stand Sit to Stand: Min guard General transfer comment: min guard assist for stability during transitions.  Cues for correct hand placement.  Ambulation/Gait Ambulation/Gait assistance: Min assist Ambulation Distance (Feet): 80 Feet Assistive device: Rolling walker (2 wheeled) Gait Pattern/deviations: Step-through pattern;Shuffle Gait velocity: decreased General Gait Details: shuffle steps with very poor foot and toe clearance.  No obvious asymmetries when comparing left to right and no knee buckling during gait.   Did some short distance gait in room around end of bed an he reached for furniture for support of bil hands.  He seems to rely on this for balance and stability.          PT Diagnosis: Difficulty walking;Abnormality of gait;Generalized weakness;Altered mental status  PT Problem List: Decreased strength;Decreased activity tolerance;Decreased balance;Decreased mobility;Decreased cognition;Decreased knowledge of use of DME;Cardiopulmonary status limiting activity PT Treatment Interventions: DME  instruction;Gait training;Stair training;Functional mobility training;Therapeutic activities;Therapeutic exercise;Balance training;Neuromuscular re-education;Patient/family education;Cognitive remediation     PT Goals(Current goals can be found in the care plan section) Acute Rehab PT Goals Patient Stated Goal: to get stronger, go home with his wife.  PT Goal  Formulation: With patient/family Time For Goal Achievement: 04/11/13 Potential to Achieve Goals: Good  Visit Information  Last PT Received On: 03/28/13 Assistance Needed: +1 History of Present Illness: 63 y.o. male admitted to [facility] on 03/20/13 with PMH of HTN, DM2, CAD with stents, CAD, PVD s/p fem pop, prostate cancer treated with radiation, hx of thyroid cancer, h/o ESRD and kidney transplantation who presents for chest pain and back pain.  The chest pain start 3 days ago at rest, it is left sided, substernal, 8/10 at the start, throbbing and sharp, non radiating, possibly worse with breathing, however, definitely worse with palpation.  The pain happened every few hours.  He was intermittently tachycardic in the ED ranging from the 80s-140.  Cardiology was consulted in the ED.  Patient further reports back pain which has been slowly worsening over past few months.  He has had work up in the outpatient setting with a bone scan and MRI lumbar spine as there was concern for bony metastasis of his prostate cancer.  Found to have HCAP, GIB with acute blood loss s/p blood transfusion, acute on chronic kidney failure, back pain with possible bone mets.         Prior [place] expects to be discharged to:: Private residence Living Arrangements: Spouse/significant other;Other relatives;Children Available Help at Discharge: Family;Available 24 hours/day (wife uses a cane herself and cannot provide physical assist.) Type of Home: House Home Access: Stairs to enter [employer] of Steps: 3 Entrance Stairs-Rails: Left Home Layout: One level Home Equipment: None Prior Function Level of Independence: Independent Comments: Per pt he has never been this weak and needed to use an assistive device. He was also not on home O2 PTA.   Communication Communication: No difficulties    Cognition  Cognition Arousal/Alertness: Awake/alert Behavior During Therapy: WFL for  tasks assessed/performed Overall Cognitive Status: Impaired/Different from baseline Area of Impairment: Problem solving Problem Solving: Slow processing General Comments: At times he trails off, or takes longer to process what he is going to say.  It is really suttle, but there.  Otherwise A&O and generally appropriate.     Extremity/Trunk Assessment Upper Extremity Assessment Upper Extremity Assessment: Defer to OT evaluation Lower Extremity Assessment Lower Extremity Assessment: Generalized weakness Cervical / Trunk Assessment Cervical / Trunk Assessment: Normal   Balance Balance Overall balance assessment: Needs assistance Sitting-balance support: No upper extremity supported;Feet supported Sitting balance-Leahy Scale: Good Standing balance support: Bilateral upper extremity supported Standing balance-Leahy Scale: Fair  End of Session PT - End of Session Equipment Utilized During Treatment: Gait belt;Oxygen Activity Tolerance: Patient limited by fatigue Patient left: in chair;with call bell/phone within reach (with OT coming in room to assess pt. ) Nurse Communication: Mobility status;Other (comment) (continues to need O2)     Niomi Valent B. [place], [place], DPT [phone]   03/28/2013, 5:37 PM

## 2013-03-28 NOTE — Progress Notes (Signed)
Subjective:  Sitting on edge of bed, actually appears quite comfortable. No significant increased work of breathing. Occasional blood-tinged sputum. He has had a bowel movement for 2, Kayexalate yesterday.  Hemoglobin this morning critically low at 5.7 repeat  6.5  Objective:  Vital Signs in the last 24 hours: Temp:  [98.4 F (36.9 C)-100 F (37.8 C)] 98.9 F (37.2 C) (01/20 0402) Pulse Rate:  [58-98] 98 (01/20 0402) Resp:  [18] 18 (01/20 0402) BP: (103-166)/(43-72) 148/54 mmHg (01/20 0402) SpO2:  [91 %-96 %] 91 % (01/20 0402)  Intake/Output from previous day: 01/19 0701 - 01/20 0700 In: 1565 [P.O.:1455; I.V.:110] Out: 800 [Urine:800]   Physical Exam: General: Sitting on edge of bed, in no acute distress. Head:  Normocephalic and atraumatic. Lungs: Diffuse rales/rhonchi bilaterally, mild. Mild expiratory wheezes heard throughout. Heart: Normal S1 and S2.  2/6 systolic murmur right upper sternal border, no rubs or gallops.  Abdomen: soft, non-tender, positive bowel sounds. Extremities: No clubbing or cyanosis. No edema. Neurologic: Alert and oriented x 3.    Lab Results:  Recent Labs  03/27/13 0430 03/28/13 0620 03/28/13 0815  WBC 9.1 11.0*  --   HGB 7.7* 5.7* 6.5*  PLT 395 363  --     Recent Labs  03/27/13 2025 03/28/13 0620  NA 135* 130*  K 5.2 5.3  CL 92* 88*  CO2 21 19  GLUCOSE 274* 365*  BUN 52* 50*  CREATININE 2.67* 2.50*    Recent Labs  03/27/13 1615 03/27/13 2310  TROPONINI <0.30 <0.30   Hepatic Function Panel  Recent Labs  03/27/13 1300  PROT 7.5  ALBUMIN 2.1*  AST 45*  ALT 26  ALKPHOS 385*  BILITOT 0.3  BILIDIR <0.2  IBILI NOT CALCULATED   Sirolimus level 03/22/13-11.0 within normal range. Repeat pending.  Imaging: Dg Chest 2 View  03/27/2013   CLINICAL DATA:  Cough, hemoptysis  EXAM: CHEST  2 VIEW  COMPARISON:  03/23/2013  FINDINGS: Multifocal patchy airspace opacities, most prominent in the right perihilar region, mildly  increased. Differential considerations include mild to moderate interstitial edema or possibly multifocal infection.  Suspected small left pleural effusion.  No pneumothorax.  Cardiomegaly.  Surgical clips overlying the bilateral neck.  IMPRESSION: Multifocal patchy airspace opacities, most prominent in the right perihilar region, mildly increased. Differential considerations include mild to moderate interstitial edema or possibly multifocal infection.  Cardiomegaly with suspected small left pleural effusion.   Electronically Signed   By: Julian Hy M.D.   On: 03/27/2013 13:00   Personally viewed.   Telemetry: Occasional couplet, PVC, brief paroxysmal atrial tachycardia. Overall normal rhythm. Personally viewed.    Cardiac Studies:  Echocardiogram 03/21/13: - Left ventricle: The cavity size was mildly dilated. Wall thickness was normal. Systolic function was mildly reduced. The estimated ejection fraction was in the range of 45% to 50%. There is hypokinesis of the lateral myocardium. Doppler parameters are consistent with restrictive physiology, indicative of decreased left ventricular diastolic compliance and/or increased left atrial pressure. Doppler parameters are consistent with high ventricular filling pressure. - Aortic valve: Valve mobility was restricted. There was mild to moderate stenosis. Trivial regurgitation. Valve area: 1.56cm^2(VTI). Valve area: 1.47cm^2 (Vmax). - Mitral valve: Mild regurgitation. - Left atrium: The atrium was mildly dilated. - Pulmonary arteries: Systolic pressure was mildly increased. PA peak pressure: 36mm Hg (S).  Nuclear stress test 03/22/13-severely limited study. Question ischemia in the RCA territory. Decreased ejection fraction 34% however echocardiogram demonstrated EF of 45-50%. RCA territory defect  to be expected because of cardiac catheterization in 2011 demonstrated chronically occluded RCA. He also has bare-metal stent to  LAD.   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   Coronary artery disease   Hypertension   Dyslipidemia, goal LDL below 70   Hypothyroidism   Back pain   Prostate cancer   Protein-calorie malnutrition, severe   Chronic diastolic CHF (congestive heart failure)   Atrial tachycardia  63 year old male status post renal transplant with chest pain, coronary artery disease status post bare-metal stent to LAD and chronically occluded RCA, mild aortic stenosis, dyslipidemia, hypertension, paroxysmal atrial tachycardia, fever, bilateral airspace disease of questionable etiology, recent hyperkalemia, recent severe anemia unknown source on chronic immunosuppression.  1. Coronary artery disease-prior BMS to LAD, chronically occluded RCA. Difficult to interpret nuclear stress test. Intensify medical therapy. Continue isosorbide. I would not perform cardiac catheterization especially in the setting of creatinine elevation. V/Q has been negative for pulmonary embolism. For now continue with metoprolol. Avoid diltiazem because of sirolimus. Discussed with nephrology team. If hemoglobin continues to be an issue, obviously Plavix and aspirin and may need to be held while issue resolves. Troponin has been normal.  2. Severe anemia-unknown source. Patchy airspace disease noted on chest x-ray, blood-tinged sputum. Does not appear to be in distress on exam. I guess it is possible to have pulmonary hemorrhage but clinically he does not appear to be that sick. Bowel movement could of been secondary as well to Kayexalate given yesterday in the setting of hyperkalemia. Continue to monitor closely. Concerning. Would have low threshold for transfusion given his underlying coronary artery disease.  3. Paroxysmal atrial tachycardia-currently well controlled on metoprolol. No changes. Avoid diltiazem.  4.  Status post renal transplant. Immunosuppressants. Fever. Patchy infiltrates on chest x-ray. Infectious disease on board. Nephrology.  5. Acute diastolic/systolic heart failure-clinically, no shortness of breath. The patchy airspace disease be secondary to edema? Avoiding diuresis at this time given his acute renal failure. As this improves, we may need to trial gentle diuresis. ProBNP is elevated from 4000 on admission to 19,000 currently. This may be in part secondary to worsening creatinine clearance.   Complex medical situation. Discussion was several team members.  Quy Lotts, [place] 03/28/2013, 10:03 AM

## 2013-03-28 NOTE — Progress Notes (Signed)
Spoke with Dr Mechele Claude regarding patient Hgb 8.1. Per her order no need to transfuse another unit at this time. Dorna Bloom, RN

## 2013-03-28 NOTE — Progress Notes (Signed)
S: Called by nurse to bedside for tachycardia. Nursing staff uncomfortable due to the complexity of the patient. Patient has no complaints at this time. He is asymptomatic from his tachycardia. He has not had a BM today. He has had no blood per rectum or bloody urine. He has had scant hemoptysis with painful coughing.   Objectively: tachycardic around 130. Reviewed tele. Patient had two episodes of asymptomatic SVT at 7 pm and 750 pm. Each episode lasted about 10 minutes. The patient had course breath sounds bil. consistent with his known HCAP.  A+P: The patient had an episode of bloody BM last night with a corresponding drop in Hg. He appears stable now, but has had two episodes of SVT. This is likely due to known history of afib with RVR which is being rate controlled with metoprolol. The patient has had a rate of mid 80's through the majority of the day. Thus, I believe that his rate control is appropriate at this time. I talked with the nurse taking care of the patient as well as the charge nurse. Both were uncomfortable with the complexity of the patient. Given their high patient to nurse ratio this is understandable. I recommend transferring the patient to a higher level of care.

## 2013-03-28 NOTE — Progress Notes (Signed)
CRITICAL VALUE ALERT  Critical value received:  Hgb 5.7  Date of notification:  03/28/2013  Time of notification:  0750  Critical value read back:yes  Nurse who received alert:  Dorna Bloom, RN  MD notified (1st page):  Dr Mechele Claude  Time of first page:  [phone]  Responding MD:  Dr Mechele Claude   Time MD responded:  [phone]

## 2013-03-28 NOTE — Progress Notes (Signed)
Subjective: Joshua [name] feels well this morning. He has not had chest pain at all since 8pm last night. He did however, develop diarrhea after receiving a total of 45g kayexalate yesterday for his hyperkalemia. Patient reports seeing a moderate amount of bright red blood in his bowel movement x 2 late last night. He continues with some diarrhea, but has not seen any blood this morning. Denies lightheadedness, dizziness, SOB.   Telemetry showed one very sort episode of PAT, no other arrhythmias.  Objective: Vital signs in last 24 hours: Filed Vitals:   03/28/13 1200 03/28/13 1220 03/28/13 1320 03/28/13 1355  BP: 127/75 157/65 125/58 126/60  Pulse: 84 86 84 82  Temp: 98 F (36.7 C) 98.8 F (37.1 C) 99.3 F (37.4 C) 99 F (37.2 C)  TempSrc: Oral     Resp: 18 22 18 20   Height:      Weight:      SpO2: 93% 94% 94%    Weight change:   Intake/Output Summary (Last 24 hours) at 03/28/13 1434 Last data filed at 03/28/13 1328  Gross per 24 hour  Intake 1107.5 ml  Output   1200 ml  Net  -92.5 ml   Physical Exam General: lying comfortably in bed, appears comfortable HEENT: [place]/AT, vision grossly intact Cardiac: RRR, harsh systolic ejection murmur loudest over R sternal border Pulm: diminished breath sounds bilaterally with some bibasilar crackles Abd: soft, nontender, nondistended, BS present Ext: warm and well perfused, no pedal edema Neuro: alert and oriented X3, cranial nerves II-XII grossly intact, moves all extremities spontaneously  Lab Results: Basic Metabolic Panel:  Recent Labs Lab 03/22/13 1357  03/28/13 0620 03/28/13 1040  NA 134*  < > 130* 131*  K 4.5  < > 5.3 4.8  CL 97  < > 88* 88*  CO2 16*  < > 19 20  GLUCOSE 155*  < > 365* 255*  BUN 28*  < > 50* 49*  CREATININE 1.96*  < > 2.50* 2.53*  CALCIUM 8.7  < > 7.7* 7.9*  PHOS 3.2  --   --   --   < > = values in this interval not displayed. Liver Function Tests:  Recent Labs Lab 03/22/13 1357 03/27/13 1300    AST  --  45*  ALT  --  26  ALKPHOS  --  385*  BILITOT  --  0.3  PROT  --  7.5  ALBUMIN 2.2* 2.1*   CBC:  Recent Labs Lab 03/26/13 1120 03/27/13 0430 03/28/13 0620 03/28/13 0815  WBC 8.3 9.1 11.0*  --   NEUTROABS 6.0  --  8.7*  --   HGB 7.7* 7.7* 5.7* 6.5*  HCT 24.8* 25.0* 18.1* 20.7*  MCV 74.7* 74.4* 73.0*  --   PLT 402* 395 363  --    Cardiac Enzymes:  Recent Labs Lab 03/27/13 0930 03/27/13 1615 03/27/13 2310  CKTOTAL 99  --   --   CKMB 1.2  --   --   TROPONINI <0.30 <0.30 <0.30   CBG:  Recent Labs Lab 03/27/13 1504 03/27/13 1615 03/27/13 1813 03/27/13 2040 03/28/13 0756 03/28/13 1158  GLUCAP 266* 265* 263* 277* 308* 211*   Iron/TIBC/Ferritin    Component Value Date/Time   IRON 17* 03/27/2013 0930   TIBC 166* 03/27/2013 0930   FERRITIN 5064* 03/27/2013 0930   BNP    Component Value Date/Time   PROBNP 19450.0* 03/27/2013 1615    Urinalysis    Component Value Date/Time   COLORURINE YELLOW  03/27/2013 1834   APPEARANCEUR CLOUDY* 03/27/2013 1834   LABSPEC 1.027 03/27/2013 1834   PHURINE 5.0 03/27/2013 1834   GLUCOSEU NEGATIVE 03/27/2013 1834   HGBUR SMALL* 03/27/2013 1834   BILIRUBINUR SMALL* 03/27/2013 [place] 03/27/2013 1834   PROTEINUR 100* 03/27/2013 1834   UROBILINOGEN 1.0 03/27/2013 1834   NITRITE NEGATIVE 03/27/2013 1834   LEUKOCYTESUR NEGATIVE 03/27/2013 1834   Micro Results: Recent Results (from the past 240 hour(s))  CULTURE, BLOOD (ROUTINE X 2)     Status: None   Collection Time    03/21/13  9:40 AM      Result Value Range Status   Specimen Description BLOOD RIGHT HAND   Final   Special Requests BOTTLES DRAWN AEROBIC AND ANAEROBIC 10CC   Final   Culture  Setup Time     Final   Value: 03/21/2013 16:16     Performed at [facility]   Culture     Final   Value: NO GROWTH 5 DAYS     Performed at [facility]   Report Status 03/27/2013 FINAL   Final  CULTURE, BLOOD (ROUTINE X 2)     Status: None    Collection Time    03/21/13  9:50 AM      Result Value Range Status   Specimen Description BLOOD RIGHT HAND   Final   Special Requests BOTTLES DRAWN AEROBIC ONLY 5CC   Final   Culture  Setup Time     Final   Value: 03/21/2013 16:16     Performed at [facility]   Culture     Final   Value: NO GROWTH 5 DAYS     Performed at [facility]   Report Status 03/27/2013 FINAL   Final  CULTURE, BLOOD (ROUTINE X 2)     Status: None   Collection Time    03/27/13  5:25 PM      Result Value Range Status   Specimen Description BLOOD RIGHT HAND   Final   Special Requests BOTTLES DRAWN AEROBIC ONLY 10CC   Final   Culture  Setup Time     Final   Value: 03/27/2013 22:06     Performed at [facility]   Culture     Final   Value:        BLOOD CULTURE RECEIVED NO GROWTH TO DATE CULTURE WILL BE HELD FOR 5 DAYS BEFORE ISSUING A FINAL NEGATIVE REPORT     Performed at [facility]   Report Status PENDING   Incomplete  CULTURE, BLOOD (ROUTINE X 2)     Status: None   Collection Time    03/27/13  5:28 PM      Result Value Range Status   Specimen Description BLOOD RIGHT HAND   Final   Special Requests BOTTLES DRAWN AEROBIC ONLY 10CC   Final   Culture  Setup Time     Final   Value: 03/27/2013 22:05     Performed at [facility]   Culture     Final   Value:        BLOOD CULTURE RECEIVED NO GROWTH TO DATE CULTURE WILL BE HELD FOR 5 DAYS BEFORE ISSUING A FINAL NEGATIVE REPORT     Performed at [facility]   Report Status PENDING   Incomplete  CULTURE, EXPECTORATED SPUTUM-ASSESSMENT     Status: None   Collection Time    03/27/13  6:34 PM      Result  Value Range Status   Specimen Description SPUTUM   Final   Special Requests NONE   Final   Sputum evaluation     Final   Value: THIS SPECIMEN IS ACCEPTABLE. RESPIRATORY CULTURE REPORT TO FOLLOW.   Report Status 03/27/2013 FINAL   Final  CULTURE, RESPIRATORY (NON-EXPECTORATED)     Status: None   Collection Time     03/27/13  6:34 PM      Result Value Range Status   Specimen Description SPUTUM   Final   Special Requests NONE   Final   Gram Stain     Final   Value: RARE WBC PRESENT,BOTH PMN AND MONONUCLEAR     NO SQUAMOUS EPITHELIAL CELLS SEEN     RARE GRAM POSITIVE COCCI IN PAIRS     RARE GRAM POSITIVE RODS     Performed at [facility]   Culture PENDING   Incomplete   Report Status PENDING   Incomplete    MISC: sirolimus level pending quantiferon level pending PTH pending CMV pcr pending EBV pcr pending BK virus pcr pending Respiratory virus panel pending Pneumocystis smear pending Aspergillus antigen pending Sputum culture- gram stain shows gram + cocci in pairs w/ gram + rods; fungal and AFB culture pending UCX pending BCX pending FOBT negative, repeat pending FENa- .2%  RISRSLT Dg Chest 2 View  03/27/2013   CLINICAL DATA:  Cough, hemoptysis  EXAM: CHEST  2 VIEW  COMPARISON:  03/23/2013  FINDINGS: Multifocal patchy airspace opacities, most prominent in the right perihilar region, mildly increased. Differential considerations include mild to moderate interstitial edema or possibly multifocal infection.  Suspected small left pleural effusion.  No pneumothorax.  Cardiomegaly.  Surgical clips overlying the bilateral neck.  IMPRESSION: Multifocal patchy airspace opacities, most prominent in the right perihilar region, mildly increased. Differential considerations include mild to moderate interstitial edema or possibly multifocal infection.  Cardiomegaly with suspected small left pleural effusion.   Electronically Signed   By: Julian Hy M.D.   On: 03/27/2013 13:00   Dg Chest 2 View  03/23/2013   CLINICAL DATA:  Chest pain with history of congestive heart failure  EXAM: CHEST  2 VIEW  COMPARISON:  03/20/2013  FINDINGS: Heart size upper normal. Mild vascular congestion has developed when compared to the prior study. There is bilateral perihilar peribronchial cuffing with  indistinct bilateral perihilar vessels and mild hazy opacity in the perihilar regions bilaterally. There are no pleural effusions. Surgical clips in the thyroid bed again identified.  IMPRESSION: Findings suggest development of congestive heart failure with mild pulmonary edema.   Electronically Signed   By: Skipper Cliche M.D.   On: 03/23/2013 16:22   Dg Chest 2 View  03/20/2013   CLINICAL DATA:  Chest and back pain.  Hypertension.  EXAM: CHEST  2 VIEW  COMPARISON:  08/19/2011  FINDINGS: The lungs are clear without focal infiltrate, edema, pneumothorax or pleural effusion. Cardiopericardial silhouette is at upper limits of normal for size. Imaged bony structures of the thorax are intact. Surgical clips are seen in the thyroid bed and left axilla.  IMPRESSION: Stable.  No acute cardiopulmonary process.   Electronically Signed   By: Misty Stanley M.D.   On: 03/20/2013 07:45   Ct Chest Wo Contrast  03/27/2013   CLINICAL DATA:  Chest pain and shortness of breath for several months. Low-grade fever. Acute renal failure in transplant patient.  EXAM: CT CHEST WITHOUT CONTRAST  TECHNIQUE: Multidetector CT imaging of the chest was performed  following the standard protocol without IV contrast.  COMPARISON:  Chest radiograph performed earlier today at 11:45 a.m.  FINDINGS: Diffuse fluffy bilateral central airspace opacity is seen, most prominent at the upper lung lobes bilaterally. This is most compatible with diffuse pneumonia. Underlying edema cannot be excluded, given underlying small bilateral pleural effusions and interstitial prominence. No pneumothorax is seen.  Scattered coronary artery calcifications are seen. The heart is mildly enlarged. A mildly prominent right paratracheal node is seen, measuring 1.3 cm in short axis. No pericardial effusion is identified. The great vessels are grossly unremarkable in appearance. The patient is status post thyroidectomy. No axillary lymphadenopathy is seen.  The visualized  portions of the liver and the spleen are unremarkable in appearance. Severe bilateral renal atrophy is seen, with scattered calcifications noted bilaterally. A small hiatal hernia is noted.  IMPRESSION: 1. Diffuse fluffy bilateral central airspace opacification, most prominent at the upper lung lobes. This is most compatible with diffuse multifocal pneumonia. Underlying edema cannot be excluded, given small bilateral pleural effusions and interstitial prominence. 2. Scattered coronary calcifications seen. 3. Mild cardiomegaly. 4. Mildly prominent right paratracheal node, measuring 1.3 cm in short axis. 5. Severe bilateral renal atrophy, with scattered associated calcifications. 6. Small hiatal hernia seen.   Electronically Signed   By: Garald Balding M.D.   On: 03/27/2013 21:48   Nm Bone Scan Whole Body  03/03/2013   CLINICAL DATA:  Pain.  Prostate cancer.  EXAM: NUCLEAR MEDICINE WHOLE BODY BONE SCAN  TECHNIQUE: Whole body anterior and posterior images were obtained approximately 3 hours after intravenous injection of radiopharmaceutical.  COMPARISON:  Bone scan 10/26/2007.  CT 02/28/2013.  RADIOPHARMACEUTICALS:  26.0 mCi Technetium-99 MDP  FINDINGS: Bilateral renal atrophy with a transplanted kidney right pelvis. No focal bony abnormality. No evidence of bony metastatic disease.  IMPRESSION: No evidence of bony metastatic disease or focal bony abnormality. Bilateral renal atrophy. Transplanted kidney right pelvis.   Electronically Signed   By: Marcello Moores  Register   On: 03/03/2013 14:02   US Renal  03/22/2013   CLINICAL DATA:  Acute kidney injury. Patient with bilateral renal transplant.  EXAM: RENAL/URINARY TRACT ULTRASOUND COMPLETE  COMPARISON:  CT, 02/28/2013  FINDINGS: Right Kidney:  Length: 7.4 cm. Kidney is echogenic. 15 mm cyst arises from the upper pole. There are probable additional tiny cysts. No hydronephrosis.  Left Kidney:  Native left kidney not visualized.  Transplant kidney: In the right lower  quadrant to right upper pelvis, a transplant kidney is visualized. Is normal in overall size measuring 10.7 cm. It has normal parenchymal echogenicity. Small cyst arises from its lower pole measuring 1 cm. No other renal masses. No hydronephrosis.  Bladder:  Appears normal for degree of bladder distention.  IMPRESSION: 1. Transplant kidney is normal in appearance within a small lower pole cyst measuring 1 cm. 2. Atrophic native kidneys. Only the right was visualized. Is echogenic with at least 1 discrete small cysts. No hydronephrosis.   Electronically Signed   By: Lajean Manes M.D.   On: 03/22/2013 15:50   Nm Myocar Multi W/spect W/wall Motion / Ef  03/22/2013   CLINICAL DATA:  63yo AA man with PMH of HTN, DM2, CAD with stents, CAD, PVD s/p fem pop, prostate cancer treated with radiation, hx of thyroid cancer, h/o ESRD and kidney transplantation who presents for chest pain and back pain  EXAM: MYOCARDIAL IMAGING WITH SPECT (REST)  TECHNIQUE: Standard myocardial SPECT imaging was performed after resting intravenous injection of Tc-87m sestamibi. Quantitative  gated imaging was also performed to evaluate left ventricular wall motion, and estimate left ventricular ejection fraction. As the patient's heart rate was 129 bpm when he presented for his "stress" images, no additional administration of Lexiscan was performed.  COMPARISON:  None.  FINDINGS: The Image quality is good. There is mild interference from visceral racer uptake. The left ventricle is moderately to severely dilated. There is a moderate in size, moderate-to-severe in intensity inferior and inferolateral perfusion abnormality seen on both rest and "stress" images, possibly worse on the "stress" images. There is global hypokinesis of the left ventricle, with severe basal and mid inferior wall hypokinesis. EF 34%. The severity of LV dysfunction is out of proportion to the perfusion abnormality.  IMPRESSION: Interpretation of the study is severely  limited by absence of a clear increase in coronary flow during acquisition of the "stress" images  There is evidence of severe ischemia at rest and/or scar in the right coronary artery distribution. This appears to be at least partly reversible.  The degree of LV dysfunction suggests multivessel CAD versus a component of superimposed nonischemic cardiomyopathy.  No previous studies are available for comparison   Electronically Signed   By: Sanda Klein   On: 03/22/2013 19:29   Nm Pulmonary Perf And Vent  03/20/2013   CLINICAL DATA:  Chest pain  EXAM: NUCLEAR MEDICINE VENTILATION - PERFUSION LUNG SCAN  Views: Anterior, posterior, left lateral, right lateral, RPO, LPO, RAO, LAO -ventilation and perfusion  Radionuclide: Technetium 57m DTPA -ventilation; Technetium 48m macroaggregated albumin-perfusion  Dose:  40.0 mCi-ventilation; 6.0 mCi-perfusion  Route of administration: Inhalation -ventilation; intravenous -perfusion  COMPARISON:  Chest radiograph March 20, 2013  FINDINGS: Radiotracer uptake on the ventilation study is homogeneous and symmetric bilaterally.  Radiotracer uptake on the perfusion study is homogeneous and symmetric bilaterally.  There is no appreciable ventilation/perfusion mismatch.  IMPRESSION: No ventilation or perfusion abnormalities. Very low probability of pulmonary embolus.   Electronically Signed   By: Lowella Grip M.D.   On: 03/20/2013 16:15   2D echo 03/22/2012- Study Conclusions  - Left ventricle: The cavity size was mildly dilated. Wall thickness was normal. Systolic function was mildly reduced. The estimated ejection fraction was in the range of 45% to 50%. There is hypokinesis of the lateral myocardium. Doppler parameters are consistent with restrictive physiology, indicative of decreased left ventricular diastolic compliance and/or increased left atrial pressure. Doppler parameters are consistent with high ventricular filling pressure. - Aortic valve: Valve  mobility was restricted. There was mild to moderate stenosis. Trivial regurgitation. Valve area: 1.56cm^2(VTI). Valve area: 1.47cm^2 (Vmax). - Mitral valve: Mild regurgitation. - Left atrium: The atrium was mildly dilated. - Pulmonary arteries: Systolic pressure was mildly increased. PA peak pressure: 31mm Hg (S).  Medications: I have reviewed the patient's current medications. Scheduled Meds: . aspirin  81 mg Oral Daily  . atorvastatin  80 mg Oral q1800  . ceFEPime (MAXIPIME) IV  2 g Intravenous Q24H  . feeding supplement (ENSURE COMPLETE)  237 mL Oral TID BM  . insulin aspart  0-9 Units Subcutaneous TID WC  . [START ON 03/29/2013] insulin glargine  28 Units Subcutaneous Daily  . isosorbide mononitrate  30 mg Oral Daily  . levothyroxine  150 mcg Oral Daily  . methocarbamol  500 mg Oral BID  . metoprolol tartrate  25 mg Oral Q6H  . mycophenolate  720 mg Oral BID  . pantoprazole  40 mg Oral Daily  . regadenoson  0.4 mg Intravenous Once  . [  START ON 03/29/2013] sirolimus  3 mg Oral Daily  . sodium bicarbonate  1,300 mg Oral BID  . vancomycin  1,000 mg Intravenous Q24H   Continuous Infusions:   PRN Meds:.sodium chloride, oxyCODONE, sodium chloride  Assessment/Plan:  HCAP: Chest CT scan showed multifocal pneumonia w/ small b/l pleural effusions. Tmax last night was 100. He had a mild leukocytosis of 11. Patient feels better subjectively, though is diaphoretic on exam with some decreased air movement and bibasilar crackles. No more hemoptysis today. He was seen by ID who recommended beginning Vanc/Cefepime for HCAP coverage.  -vanc/cefepime day 1 -UA showed no signs of UTI -sputum gram stain showed gram + cocci in pairs and gram + rods  -ID consult- checking sputum for aerobic, fungal, AFB culture, BCx x 2,  aspergillus Ag, CMV PCR, EBV pcr, beta D glucan (fungitell assay), pneumocystis stain -Pulmonology consult, appreciate recs -PT/OT  Acute GI bleed w/ dropping hemoglobin:  Patient with profuse diarrhea last night after receiving substantial amount of kayexalate. He had 2 episodes of bloody stools overnight with approximate 2g drop in hemoglobin. Hb this morning 5.7, repeat 6.5. Patient feels somewhat weak, though this is not new this morning. Otherwise, he does not endorse symptoms of anemia. He continues to have diarrhea, though no more blood since late last night. -GI consult, appreciate recs -transfuse 1U pRBC -post transfusion CBC  -d/c heparin, ASA, plavix (1/20 first day without any of these medications) -repeat FOBT pending -repeat CBC in AM  Chest pain with hx of CAD, dCHF and current atrial tachycardia- ACS ruled out again on 1/19 w/ three negative troponins and CKMB wnl after he experienced another episode of chest pain. Patient's HR is well controlled on metoprolol 25mg  q8h as he had only one short episode of PAT overnight. Pro-BNP is elevated further at 19,450 from 4252 on admission. This may represent fluid overload, though more likely related to his declining kidney function. No more chest pain since yesterday evening. Chest pain may be related to ongoing HCAP and pleural effusion rather than cardiac in origin. -telemetry  -cardiology following, appreciate recs -continue metroprolol tartrate 25mg  q8h -holding ASA, plavix, heparin in setting of GI bleed -Continue cardiac medications: Nitroglycerin, lipitor, metoprolol, IMDUR  Hyperkalemia: Resolved after patient received total of calcium gluconate 3g, 45g kayexalate, aspart insulin 10u w/ amp D50 as well as a nebulizer treatment. Repeat EKG showed resolution of peaked T waves. Latest K 4.8 at 10am today.  Anion Gap Metabolic acidosis: AG remains elevated and is 23 today. Unclear etiology. Starvation ketoacidosis vs. medication side effect vs A/CKD. Sirolimus and mycophenolate can both cause acidosis, though unsure if they specifically can cause an increase in AG. 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 Cr a few weeks ago was 1.7-1.8. FENa shows prerenal picture, perhaps 2/2 acute blood loss.  -f/u sirolimus level -holding sirolimus w/ plan to restart tomorrow at 3mg  per nephrology -renal following, appreciate recs  Metastatic Prostate cancer with back pain: Back pain is stable. I spoke with patient's uro-oncologist today who does not have any further recommendations for work up at this time.  -Outpatient uro-oncology work up  HTN: stable -continue Amlodipine 10 mg daily, IMDUR 30mg  daily and metoprolol 25mg  q6h  DM-II: A1c 7.8 on admission. On Lantus 24 U daily at home. CBG control is fair: 265-308. -will increase lantus to 28U daily -  SSI sensitive   DVT px: Heparin sq Diet: carb modified Dispo: patient not ready for discharge yet--defer until clinical improvement  The patient does have a current PCP (Sueanne Margarita, MD) and does not need an [facility] hospital follow-up appointment after discharge.  The patient does not have transportation limitations that hinder transportation to clinic appointments.  Services Needed at time of discharge: Y = Yes, Blank = No PT:   OT:   RN:   Equipment:   Other:     LOS: 8 days   Rebecca Eaton, MD 03/28/2013, 2:34 PM

## 2013-03-28 NOTE — Progress Notes (Signed)
Spoke with Dr Marlou Porch regarding patient blood transfusion. OK to give 2 units. Per MD will not administer lasix in between units at this time. Dorna Bloom, RN

## 2013-03-28 NOTE — Progress Notes (Deleted)
ANTIBIOTIC CONSULT NOTE - INITIAL  Pharmacy Consult for Vancocin and Maxipime Indication: pneumonia  Allergies  Allergen Reactions  . Fentanyl     "Almost died"   . Propofol     "Almost died"     Patient Measurements: Height: 5\' 11"  (180.3 cm) Weight: 179 lb 10.8 oz (81.5 kg) IBW/kg (Calculated) : 75.3  Vital Signs: Temp: 100 F (37.8 C) (01/19 2046) Temp src: Oral (01/19 2046) BP: 166/72 mmHg (01/19 2046) Pulse Rate: 92 (01/19 2046) Intake/Output from previous day: 01/19 0701 - 01/20 0700 In: 1080 [P.O.:1080] Out: 400 [Urine:400]  Labs:  Recent Labs  03/26/13 1120 03/27/13 0430 03/27/13 1300 03/27/13 1725 03/27/13 1834 03/27/13 2025  WBC 8.3 9.1  --   --   --   --   HGB 7.7* 7.7*  --   --   --   --   PLT 402* 395  --   --   --   --   LABCREA  --   --   --   --  214.72  --   CREATININE  --  2.42* 2.60* 2.71*  --  2.67*   Estimated Creatinine Clearance: 30.9 ml/min (by C-G formula based on Cr of 2.67).   Microbiology: Recent Results (from the past 720 hour(s))  CULTURE, BLOOD (ROUTINE X 2)     Status: None   Collection Time    03/21/13  9:40 AM      Result Value Range Status   Specimen Description BLOOD RIGHT HAND   Final   Special Requests BOTTLES DRAWN AEROBIC AND ANAEROBIC 10CC   Final   Culture  Setup Time     Final   Value: 03/21/2013 16:16     Performed at [facility]   Culture     Final   Value: NO GROWTH 5 DAYS     Performed at [facility]   Report Status 03/27/2013 FINAL   Final  CULTURE, BLOOD (ROUTINE X 2)     Status: None   Collection Time    03/21/13  9:50 AM      Result Value Range Status   Specimen Description BLOOD RIGHT HAND   Final   Special Requests BOTTLES DRAWN AEROBIC ONLY 5CC   Final   Culture  Setup Time     Final   Value: 03/21/2013 16:16     Performed at [facility]   Culture     Final   Value: NO GROWTH 5 DAYS     Performed at [facility]   Report Status 03/27/2013 FINAL   Final   CULTURE, EXPECTORATED SPUTUM-ASSESSMENT     Status: None   Collection Time    03/27/13  6:34 PM      Result Value Range Status   Specimen Description SPUTUM   Final   Special Requests NONE   Final   Sputum evaluation     Final   Value: THIS SPECIMEN IS ACCEPTABLE. RESPIRATORY CULTURE REPORT TO FOLLOW.   Report Status 03/27/2013 FINAL   Final    Medical History: Past Medical History  Diagnosis Date  . Carotid artery occlusion   . ASCVD (arteriosclerotic cardiovascular disease)   . CHF (congestive heart failure)   . Anginal pain     last chest pain was a couple of months ago-just lasted less than a minute  . Stroke     "light stroke" about time of MI  . Renal failure     s/p renal transplant  .  Aortic stenosis     moderate AS by 06/2011 echo (Dr. Fransico Him)  . Coronary artery disease 03/2009    2 vessel s/p BMS to LAD in setting of NSTEMI, RCA chronically occluded  . PVD (peripheral vascular disease)     s/p fempop 1980's  . Hypothyroidism     s/p papillary thyroid CA excision  . Hypertension   . Hyperlipidemia   . Aortic stenosis     moderate by echo 06/2012  . Diastolic dysfunction   . Heart murmur   . Myocardial infarct 03/2009 X2  . Type II diabetes mellitus   . Prostate cancer     "radiation tx ~ 4-5 yr ago; cleared then came back a couple months ago" (03/20/2013)    Medications:  Prescriptions prior to admission  Medication Sig Dispense Refill  . amLODipine (NORVASC) 10 MG tablet Take 1 tablet (10 mg total) by mouth daily.  30 tablet  6  . aspirin 81 MG chewable tablet Chew 81 mg by mouth daily.        Marland Kitchen atenolol (TENORMIN) 50 MG tablet Take 50 mg by mouth daily.      Marland Kitchen atorvastatin (LIPITOR) 80 MG tablet TAKE 1 TABLET BY MOUTH EVERY DAY  30 tablet  0  . clopidogrel (PLAVIX) 75 MG tablet Take 1 tablet (75 mg total) by mouth daily.  30 tablet  6  . insulin glargine (LANTUS) 100 UNIT/ML injection Inject 24 Units into the skin at bedtime.       . insulin lispro  (HUMALOG) 100 UNIT/ML injection Inject 6 Units into the skin 3 (three) times daily as needed (for sugars over 280).       Marland Kitchen levothyroxine (SYNTHROID, LEVOTHROID) 150 MCG tablet Take 150 mcg by mouth daily.      . methocarbamol (ROBAXIN) 500 MG tablet Take 1 tablet (500 mg total) by mouth 2 (two) times daily.  20 tablet  1  . mycophenolate (MYFORTIC) 180 MG EC tablet Take 720 mg by mouth 2 (two) times daily.       Marland Kitchen oxyCODONE-acetaminophen (PERCOCET) 10-325 MG per tablet Take 1 tablet by mouth every 4 (four) hours as needed for pain.  30 tablet  0  . pantoprazole (PROTONIX) 40 MG tablet Take 40 mg by mouth daily.      . sirolimus (RAPAMUNE) 1 MG tablet Take 4 mg by mouth daily.        Scheduled:  . aspirin  81 mg Oral Daily  . atorvastatin  80 mg Oral q1800  . clopidogrel  75 mg Oral Q breakfast  . feeding supplement (ENSURE COMPLETE)  237 mL Oral TID BM  . heparin  5,000 Units Subcutaneous Q8H  . insulin aspart  0-9 Units Subcutaneous TID WC  . insulin glargine  24 Units Subcutaneous Daily  . isosorbide mononitrate  30 mg Oral Daily  . levothyroxine  150 mcg Oral Daily  . methocarbamol  500 mg Oral BID  . metoprolol tartrate  25 mg Oral Q6H  . mycophenolate  720 mg Oral BID  . pantoprazole  40 mg Oral Daily  . regadenoson  0.4 mg Intravenous Once  . sodium bicarbonate  1,300 mg Oral BID    Assessment: 63yo male admitted 1/12 w/ CP, now w/ concern for HCAP, ID recommended multiple tests for multifocal patchy airspace dz on CXR, to hold off on ABX unless clinical worsening, now to begin vanc and cefepime for possible HCAP.  Goal of Therapy:  Vancomycin trough level 15-20  mcg/ml  Plan:  Will begin vancomycin 1000mg  IV Q24H and cefepime 2g IV Q24H and monitor CBC, Cx, testing results, and levels prn.  Wynona Neat, PharmD, BCPS  03/28/2013,12:57 AM

## 2013-03-28 NOTE — Progress Notes (Signed)
[name] Progress Note   Subjective:   Patient reports that he is feeling very well this am. No reports of SOB. No worsening edema.    Objective:   BP 148/54  Pulse 98  Temp(Src) 98.9 F (37.2 C) (Oral)  Resp 18  Ht 5\' 11"  (1.803 m)  Wt 179 lb 10.8 oz (81.5 kg)  BMI 25.07 kg/m2  SpO2 91%  Intake/Output Summary (Last 24 hours) at 03/28/13 0720 Last data filed at 03/28/13 0630  Gross per 24 hour  Intake   1565 ml  Output    800 ml  Net    765 ml   Physical Exam: Gen: resting in bed. NAD. Tremor noted but improved from yesterday. Still some asterixis CVS: RRR. 2/6 Systolic murmur. Resp: Bibasilar rales noted. Abd: soft, nontender, abdomen full secondary to renal transplant. Ext: No LE edema.   Imaging: Dg Chest 2 View  03/27/2013   CLINICAL DATA:  Cough, hemoptysis  EXAM: CHEST  2 VIEW  COMPARISON:  03/23/2013  FINDINGS: Multifocal patchy airspace opacities, most prominent in the right perihilar region, mildly increased. Differential considerations include mild to moderate interstitial edema or possibly multifocal infection.  Suspected small left pleural effusion.  No pneumothorax.  Cardiomegaly.  Surgical clips overlying the bilateral neck.  IMPRESSION: Multifocal patchy airspace opacities, most prominent in the right perihilar region, mildly increased. Differential considerations include mild to moderate interstitial edema or possibly multifocal infection.  Cardiomegaly with suspected small left pleural effusion.   Electronically Signed   By: Julian Hy M.D.   On: 03/27/2013 13:00   Ct Chest Wo Contrast  03/27/2013   CLINICAL DATA:  Chest pain and shortness of breath for several months. Low-grade fever. Acute renal failure in transplant patient.  EXAM: CT CHEST WITHOUT CONTRAST  TECHNIQUE: Multidetector CT imaging of the chest was performed following the standard protocol without IV contrast.  COMPARISON:  Chest radiograph performed earlier today at  11:45 a.m.  FINDINGS: Diffuse fluffy bilateral central airspace opacity is seen, most prominent at the upper lung lobes bilaterally. This is most compatible with diffuse pneumonia. Underlying edema cannot be excluded, given underlying small bilateral pleural effusions and interstitial prominence. No pneumothorax is seen.  Scattered coronary artery calcifications are seen. The heart is mildly enlarged. A mildly prominent right paratracheal node is seen, measuring 1.3 cm in short axis. No pericardial effusion is identified. The great vessels are grossly unremarkable in appearance. The patient is status post thyroidectomy. No axillary lymphadenopathy is seen.  The visualized portions of the liver and the spleen are unremarkable in appearance. Severe bilateral renal atrophy is seen, with scattered calcifications noted bilaterally. A small hiatal hernia is noted.  IMPRESSION: 1. Diffuse fluffy bilateral central airspace opacification, most prominent at the upper lung lobes. This is most compatible with diffuse multifocal pneumonia. Underlying edema cannot be excluded, given small bilateral pleural effusions and interstitial prominence. 2. Scattered coronary calcifications seen. 3. Mild cardiomegaly. 4. Mildly prominent right paratracheal node, measuring 1.3 cm in short axis. 5. Severe bilateral renal atrophy, with scattered associated calcifications. 6. Small hiatal hernia seen.   Electronically Signed   By: Garald Balding M.D.   On: 03/27/2013 21:48    Labs: BMET  Recent Labs Lab 03/22/13 1357  03/24/13 0503 03/25/13 0500 03/26/13 0530 03/27/13 0430 03/27/13 1300 03/27/13 1725 03/27/13 2025  NA 134*  < > 136* 139 134* 134* 133* 135* 135*  K 4.5  < > 4.7 4.6 4.6 5.6* 6.1* 5.4* 5.2  CL 97  < > 100 99 95* 94* 92* 93* 92*  CO2 16*  < > 19 21 20 19 20 22 21   GLUCOSE 155*  < > 119* 84 172* 261* 313* 279* 274*  BUN 28*  < > 31* 37* 39* 44* 50* 52* 52*  CREATININE 1.96*  < > 2.23* 2.47* 2.62* 2.42* 2.60*  2.71* 2.67*  CALCIUM 8.7  < > 8.0* 8.3* 7.7* 7.7* 7.7* 8.2* 8.5  PHOS 3.2  --   --   --   --   --   --   --   --   < > = values in this interval not displayed. CBC  Recent Labs Lab 03/26/13 1120 03/27/13 0430  WBC 8.3 9.1  NEUTROABS 6.0  --   HGB 7.7* 7.7*  HCT 24.8* 25.0*  MCV 74.7* 74.4*  PLT 402* 395    Medications:     . aspirin  81 mg Oral Daily  . atorvastatin  80 mg Oral q1800  . clopidogrel  75 mg Oral Q breakfast  . feeding supplement (ENSURE COMPLETE)  237 mL Oral TID BM  . heparin  5,000 Units Subcutaneous Q8H  . insulin aspart  0-9 Units Subcutaneous TID WC  . insulin glargine  24 Units Subcutaneous Daily  . isosorbide mononitrate  30 mg Oral Daily  . levothyroxine  150 mcg Oral Daily  . methocarbamol  500 mg Oral BID  . metoprolol tartrate  25 mg Oral Q6H  . mycophenolate  720 mg Oral BID  . pantoprazole  40 mg Oral Daily  . regadenoson  0.4 mg Intravenous Once  . sodium bicarbonate  1,300 mg Oral BID    Assessment/ Plan:   Patient is a 63 y.o. male with a PMHx of HTN, DM-2, CAD s/p stenting of LAD, hx of papillary thyroid cancer, CKD III s/p Renal transplant, PVD, Carotid artery stenosis s/p right carotid endarterectomy, metastatic prostate cancerwho was [facility] for evaluation of chest pain.  1) Chest pain, Hx of CAD  2) Hx of Diastolic CHF  3) Acute on chronic kidney disease s/p history of kidney transplant, baseline creatinine 1.6-1.8 Cr stable.  Vol xs mild.  FENA low c/w drug toxicity with MTor inhibitor, low Hb, or CHF.. Ongoing concern of viral infx 4) Anion Gap Metabolic acidosis  5) Fever of Unknown origin  6) Metastatic prostate cancer  7) HTN  8) DM-2  9) Atrial Tachycardia  10) Anemia  11) Hyperkalemia   Plan:  Acute on chronic kidney disease s/p history of kidney transplant, baseline creatinine 1.6-1.8  - Patient was started on Diltiazem on 1/14. This is a known inhibitor of Sirolimus metabolism. This has likely  lead to toxicity and the Acute renal failure.  - Awaiting sirolimus level.  Plan to restart @ 3 mg tomorrow.  - UA revealed small blood, 100 protein, hyaline and granular casts - Urine sodium <20; FENa <0.18 (prerenal) indicating low perfusion to the kidneys.  Likely secondary to toxicity and significant anemia.  - Creatinine stable this am  - Will continue to follow closely  Fever of Unknown origin  - Sirolimus toxicity could be contributing (due to increased immunosuppression).  - Awaiting BK virus, EBV, and CMV PCR  - Chest xray today revealed ? Multifocal PNA vs Moderate Interstitial edema.  ID is following patient.  ID also recommended Pulm consult.  - Further workup/management per ID/Primary team  Anemia  - Iron 17, % Saturation of 10  -  IV Feraheme and Aranesp given Yesterday - Anemia worsened this am (Hb 5.7); unclear etiology at this time. - Transfusion underway.  Hyperkalemia & Metabolic Acidosis, AG  - K+ 5.2 this am. - Acidosis unchanged. Will continue TID Bicarb. - Will continue to monitor closely.   Coral Spikes, DO PGY-2, [facility] 03/28/2013, 7:20 AM I have seen and examined this patient and agree with the plan of care seen, examined and eval.  .  Alea Ryer L 03/28/2013, 4:07 PM

## 2013-03-28 NOTE — Progress Notes (Signed)
Inpatient Diabetes Program Recommendations  AACE/ADA: New Consensus Statement on Inpatient Glycemic Control (2013)  Target Ranges:  Prepandial:   less than 140 mg/dL      Peak postprandial:   less than 180 mg/dL (1-2 hours)      Critically ill patients:  140 - 180 mg/dL     Results for Joshua [name], Joshua [name] (MRN [id number]) as of 03/28/2013 13:34  Ref. Range 03/27/2013 08:05 03/27/2013 12:12 03/27/2013 15:04 03/27/2013 16:15 03/27/2013 18:13 03/27/2013 20:40  Glucose-Capillary Latest Range: 70-99 mg/dL 229 (H) 284 (H) 266 (H) 265 (H) 263 (H) 277 (H)    Results for Joshua [name], Joshua [name] (MRN [id number]) as of 03/28/2013 13:34  Ref. Range 03/28/2013 07:56 03/28/2013 11:58  Glucose-Capillary Latest Range: 70-99 mg/dL 308 (H) 211 (H)      **Per records, patient takes the following insulin at home:  Lantus 24 units daily in the AM  Humalog 6 units tid with meals    **MD- Please consider the following in-hospital insulin adjustments:   1. Increase Lantus to 28 units daily in the AM  2. Add 1/2 patient's home meal coverage dose- Novolog 3 units tid with meals    Will follow. Wyn Quaker RN, MSN, CDE Diabetes Coordinator Inpatient Diabetes Program Team Pager: [phone] (8a-10p)

## 2013-03-28 NOTE — Progress Notes (Signed)
At 2033 pt complained of 8/10 chest pain that was located in the center of his chest, felt like a pressure, was gradual in onset, did not radiate, and nothing made better or worse.  Pt reports feeling the same pain earlier during the day and was given pain medication which relieve the pain.  An ECG was done and MD notified.  No new orders were placed at that time.  Pt was given 10mg  of Oxycodone and reported pain decreasing to 2/10.  Pt currently asleep.  Will continue to monitor.

## 2013-03-28 NOTE — Progress Notes (Signed)
ANTIBIOTIC CONSULT NOTE - INITIAL  Pharmacy Consult for Vancomycin and Cefepime Indication: HCAP  Allergies  Allergen Reactions  . Fentanyl     "Almost died"   . Propofol     "Almost died"     Patient Measurements: Height: 5\' 11"  (180.3 cm) Weight: 179 lb 10.8 oz (81.5 kg) IBW/kg (Calculated) : 75.3  Vital Signs: Temp: 98.9 F (37.2 C) (01/20 0402) Temp src: Oral (01/20 0402) BP: 148/54 mmHg (01/20 0402) Pulse Rate: 98 (01/20 0402) Intake/Output from previous day: 01/19 0701 - 01/20 0700 In: 1565 [P.O.:1455; I.V.:110] Out: 800 [Urine:800]  Labs:  Recent Labs  03/26/13 1120 03/27/13 0430  03/27/13 1725 03/27/13 1834 03/27/13 2025 03/28/13 0620  WBC 8.3 9.1  --   --   --   --  11.0*  HGB 7.7* 7.7*  --   --   --   --  5.7*  PLT 402* 395  --   --   --   --  363  LABCREA  --   --   --   --  214.72  --   --   CREATININE  --  2.42*  < > 2.71*  --  2.67* 2.50*  < > = values in this interval not displayed. Estimated Creatinine Clearance: 33 ml/min (by C-G formula based on Cr of 2.5).   Microbiology: Recent Results (from the past 720 hour(s))  CULTURE, BLOOD (ROUTINE X 2)     Status: None   Collection Time    03/21/13  9:40 AM      Result Value Range Status   Specimen Description BLOOD RIGHT HAND   Final   Special Requests BOTTLES DRAWN AEROBIC AND ANAEROBIC 10CC   Final   Culture  Setup Time     Final   Value: 03/21/2013 16:16     Performed at [facility]   Culture     Final   Value: NO GROWTH 5 DAYS     Performed at [facility]   Report Status 03/27/2013 FINAL   Final  CULTURE, BLOOD (ROUTINE X 2)     Status: None   Collection Time    03/21/13  9:50 AM      Result Value Range Status   Specimen Description BLOOD RIGHT HAND   Final   Special Requests BOTTLES DRAWN AEROBIC ONLY 5CC   Final   Culture  Setup Time     Final   Value: 03/21/2013 16:16     Performed at [facility]   Culture     Final   Value: NO GROWTH 5 DAYS   Performed at [facility]   Report Status 03/27/2013 FINAL   Final  CULTURE, BLOOD (ROUTINE X 2)     Status: None   Collection Time    03/27/13  5:25 PM      Result Value Range Status   Specimen Description BLOOD RIGHT HAND   Final   Special Requests BOTTLES DRAWN AEROBIC ONLY 10CC   Final   Culture  Setup Time     Final   Value: 03/27/2013 22:06     Performed at [facility]   Culture     Final   Value:        BLOOD CULTURE RECEIVED NO GROWTH TO DATE CULTURE WILL BE HELD FOR 5 DAYS BEFORE ISSUING A FINAL NEGATIVE REPORT     Performed at [facility]   Report Status PENDING   Incomplete  CULTURE, BLOOD (  ROUTINE X 2)     Status: None   Collection Time    03/27/13  5:28 PM      Result Value Range Status   Specimen Description BLOOD RIGHT HAND   Final   Special Requests BOTTLES DRAWN AEROBIC ONLY 10CC   Final   Culture  Setup Time     Final   Value: 03/27/2013 22:05     Performed at [facility]   Culture     Final   Value:        BLOOD CULTURE RECEIVED NO GROWTH TO DATE CULTURE WILL BE HELD FOR 5 DAYS BEFORE ISSUING A FINAL NEGATIVE REPORT     Performed at [facility]   Report Status PENDING   Incomplete  CULTURE, EXPECTORATED SPUTUM-ASSESSMENT     Status: None   Collection Time    03/27/13  6:34 PM      Result Value Range Status   Specimen Description SPUTUM   Final   Special Requests NONE   Final   Sputum evaluation     Final   Value: THIS SPECIMEN IS ACCEPTABLE. RESPIRATORY CULTURE REPORT TO FOLLOW.   Report Status 03/27/2013 FINAL   Final  CULTURE, RESPIRATORY (NON-EXPECTORATED)     Status: None   Collection Time    03/27/13  6:34 PM      Result Value Range Status   Specimen Description SPUTUM   Final   Special Requests NONE   Final   Gram Stain     Final   Value: RARE WBC PRESENT,BOTH PMN AND MONONUCLEAR     NO SQUAMOUS EPITHELIAL CELLS SEEN     RARE GRAM POSITIVE COCCI IN PAIRS     RARE GRAM POSITIVE RODS     Performed at  [facility]   Culture PENDING   Incomplete   Report Status PENDING   Incomplete    Medical History: Past Medical History  Diagnosis Date  . Carotid artery occlusion   . ASCVD (arteriosclerotic cardiovascular disease)   . CHF (congestive heart failure)   . Anginal pain     last chest pain was a couple of months ago-just lasted less than a minute  . Stroke     "light stroke" about time of MI  . Renal failure     s/p renal transplant  . Aortic stenosis     moderate AS by 06/2011 echo (Dr. Fransico Him)  . Coronary artery disease 03/2009    2 vessel s/p BMS to LAD in setting of NSTEMI, RCA chronically occluded  . PVD (peripheral vascular disease)     s/p fempop 1980's  . Hypothyroidism     s/p papillary thyroid CA excision  . Hypertension   . Hyperlipidemia   . Aortic stenosis     moderate by echo 06/2012  . Diastolic dysfunction   . Heart murmur   . Myocardial infarct 03/2009 X2  . Type II diabetes mellitus   . Prostate cancer     "radiation tx ~ 4-5 yr ago; cleared then came back a couple months ago" (03/20/2013)   Assessment: 63yo male admitted 03/20/13 with chest pain. CT chest now shows diffuse bilateral opacities most compatible with multifocal pneumonia. He will begin empiric antibiotics to cover for HCAP. He has a history of CKD and sCr seems to be around baseline at 2.5 with CrCl 44ml/min.  Goal of Therapy:  Vancomycin trough level 15-20 mcg/ml  Plan:  1) Cefepime 2g IV q24 2) Vancomycin 1g IV  q24 3) Follow renal function, cultures, LOT, level if needed  Nena Jordan, PharmD, BCPS  03/28/2013,8:42 AM

## 2013-03-29 ENCOUNTER — Encounter (HOSPITAL_COMMUNITY): Payer: Self-pay | Admitting: Physician Assistant

## 2013-03-29 DIAGNOSIS — J189 Pneumonia, unspecified organism: Secondary | ICD-10-CM

## 2013-03-29 DIAGNOSIS — D649 Anemia, unspecified: Secondary | ICD-10-CM

## 2013-03-29 LAB — CBC WITH DIFFERENTIAL/PLATELET
BASOS ABS: 0 10*3/uL (ref 0.0–0.1)
Basophils Relative: 0 % (ref 0–1)
Eosinophils Absolute: 0.1 10*3/uL (ref 0.0–0.7)
Eosinophils Relative: 1 % (ref 0–5)
HEMATOCRIT: 22.5 % — AB (ref 39.0–52.0)
Hemoglobin: 7.2 g/dL — ABNORMAL LOW (ref 13.0–17.0)
LYMPHS ABS: 1.7 10*3/uL (ref 0.7–4.0)
LYMPHS PCT: 17 % (ref 12–46)
MCH: 23.5 pg — ABNORMAL LOW (ref 26.0–34.0)
MCHC: 32 g/dL (ref 30.0–36.0)
MCV: 73.5 fL — ABNORMAL LOW (ref 78.0–100.0)
Monocytes Absolute: 0.7 10*3/uL (ref 0.1–1.0)
Monocytes Relative: 7 % (ref 3–12)
NEUTROS ABS: 7.7 10*3/uL (ref 1.7–7.7)
Neutrophils Relative %: 75 % (ref 43–77)
Platelets: 318 10*3/uL (ref 150–400)
RBC: 3.06 MIL/uL — ABNORMAL LOW (ref 4.22–5.81)
RDW: 18.2 % — AB (ref 11.5–15.5)
WBC: 10.2 10*3/uL (ref 4.0–10.5)

## 2013-03-29 LAB — BASIC METABOLIC PANEL
BUN: 46 mg/dL — AB (ref 6–23)
CALCIUM: 7.5 mg/dL — AB (ref 8.4–10.5)
CHLORIDE: 90 meq/L — AB (ref 96–112)
CO2: 22 mEq/L (ref 19–32)
Creatinine, Ser: 2.31 mg/dL — ABNORMAL HIGH (ref 0.50–1.35)
GFR calc Af Amer: 33 mL/min — ABNORMAL LOW (ref >90)
GFR calc non Af Amer: 29 mL/min — ABNORMAL LOW (ref >90)
Glucose, Bld: 204 mg/dL — ABNORMAL HIGH (ref 70–99)
Potassium: 4 mEq/L (ref 3.7–5.3)
Sodium: 132 mEq/L — ABNORMAL LOW (ref 137–147)

## 2013-03-29 LAB — MRSA PCR SCREENING: MRSA by PCR: NEGATIVE

## 2013-03-29 LAB — GLUCOSE, CAPILLARY
GLUCOSE-CAPILLARY: 161 mg/dL — AB (ref 70–99)
GLUCOSE-CAPILLARY: 142 mg/dL — AB (ref 70–99)
Glucose-Capillary: 124 mg/dL — ABNORMAL HIGH (ref 70–99)
Glucose-Capillary: 200 mg/dL — ABNORMAL HIGH (ref 70–99)
Glucose-Capillary: 177 mg/dL — ABNORMAL HIGH (ref 70–99)

## 2013-03-29 LAB — CYTOMEGALOVIRUS PCR, QUALITATIVE: Cytomegalovirus DNA: NOT DETECTED

## 2013-03-29 LAB — QUANTIFERON TB GOLD ASSAY (BLOOD)
Mitogen value: 0.1 IU/mL
Quantiferon Nil Value: 0.01 IU/mL
TB AG VALUE: 0.02 [IU]/mL
TB ANTIGEN MINUS NIL VALUE: 0.01 [IU]/mL

## 2013-03-29 MED ORDER — BISACODYL 5 MG PO TBEC
5.0000 mg | DELAYED_RELEASE_TABLET | Freq: Two times a day (BID) | ORAL | Status: AC
  Administered 2013-03-29 – 2013-03-30 (×4): 5 mg via ORAL
  Filled 2013-03-29 (×3): qty 1

## 2013-03-29 MED ORDER — METOPROLOL TARTRATE 50 MG PO TABS
50.0000 mg | ORAL_TABLET | Freq: Four times a day (QID) | ORAL | Status: DC
  Administered 2013-03-29 – 2013-04-05 (×28): 50 mg via ORAL
  Filled 2013-03-29 (×36): qty 1

## 2013-03-29 MED ORDER — LEVALBUTEROL HCL 1.25 MG/0.5ML IN NEBU
1.2500 mg | INHALATION_SOLUTION | Freq: Four times a day (QID) | RESPIRATORY_TRACT | Status: DC | PRN
  Filled 2013-03-29: qty 0.5

## 2013-03-29 MED ORDER — LEVALBUTEROL HCL 1.25 MG/0.5ML IN NEBU
1.2500 mg | INHALATION_SOLUTION | Freq: Once | RESPIRATORY_TRACT | Status: AC
  Administered 2013-03-29: 1.25 mg via RESPIRATORY_TRACT
  Filled 2013-03-29: qty 0.5

## 2013-03-29 MED ORDER — METOPROLOL TARTRATE 1 MG/ML IV SOLN
2.5000 mg | INTRAVENOUS | Status: DC | PRN
  Administered 2013-03-29 – 2013-03-30 (×4): 5 mg via INTRAVENOUS
  Filled 2013-03-29 (×4): qty 5

## 2013-03-29 NOTE — Progress Notes (Signed)
Called md re hr; new orders rec'd to give scheduled lopressor now. Will continue to monitor.

## 2013-03-29 NOTE — Progress Notes (Deleted)
Error, see other note.

## 2013-03-29 NOTE — Progress Notes (Signed)
Patient awakened from acute pain "around his kidney," oxy IR given at 0430. Patient asleep an hour later. RN called to room about 0630, patient continues to c/o "kidney pain". MD made aware. No new orders received. Will continue to monitor.   Lum Babe, RN

## 2013-03-29 NOTE — Progress Notes (Signed)
Daily Rounding Note  04/14/2013, 11:31 AM  LOS: 9 days   SUBJECTIVE:       Fleeting right flank pain has resolved.  No BMs.  Tolerating diet.  OBJECTIVE:         Vital signs in last 24 hours:    Temp:  [98 F (36.7 C)-100 F (37.8 C)] 98.9 F (37.2 C) 2022/04/14 0743) Pulse Rate:  [82-135] 85 04-14-2022 0743) Resp:  [16-22] 18 2022-04-14 0743) BP: (125-170)/(50-88) 143/63 mmHg April 14, 2022 0743) SpO2:  [88 %-96 %] 92 % 04-14-22 0743) Weight:  [81.7 kg (180 lb 1.9 oz)-81.8 kg (180 lb 5.4 oz)] 81.8 kg (180 lb 5.4 oz) 2022/04/14 0400) Last BM Date: 03/27/13 General: pleasant, looks well.    Heart: RRR.  No MRG Chest: diminished bil BS.  No dyspnea Abdomen: soft, NT, ND.  Active BS  Extremities: no CCE Neuro/Psych:  Oriented x 3, alert, appropriate.   Intake/Output from previous day: 01/20 0701 - 2022/04/14 0700 In: 558.3 [I.V.:250; Blood:308.3] Out: 900 [Urine:900]  Intake/Output this shift: Total I/O In: 100 [IV Piggyback:100] Out: -   Lab Results:  Recent Labs  03/27/13 0430 03/28/13 0620 03/28/13 0815 03/28/13 1745 04-14-2013 0352  WBC 9.1 11.0*  --   --  10.2  HGB 7.7* 5.7* 6.5* 8.1* 7.2*  HCT 25.0* 18.1* 20.7* 25.9* 22.5*  PLT 395 363  --   --  318   BMET  Recent Labs  03/28/13 0620 03/28/13 1040 04/14/2013 0352  NA 130* 131* 132*  K 5.3 4.8 4.0  CL 88* 88* 90*  CO2 19 20 22   GLUCOSE 365* 255* 204*  BUN 50* 49* 46*  CREATININE 2.50* 2.53* 2.31*  CALCIUM 7.7* 7.9* 7.5*   LFT  Recent Labs  03/27/13 1300  PROT 7.5  ALBUMIN 2.1*  AST 45*  ALT 26  ALKPHOS 385*  BILITOT 0.3  BILIDIR <0.2  IBILI NOT CALCULATED    Studies/Results: Dg Chest 2 View 2013-04-14   CLINICAL DATA:  Shortness of breath and cough.  EXAM: CHEST  2 VIEW  COMPARISON:  Chest radiograph and CT of the chest performed 03/27/2013  FINDINGS: The lungs are well-aerated. Fluffy bilateral central airspace opacification is again noted bilaterally,  perhaps slightly improved from the prior study at the right lung base. Small bilateral pleural effusions are noted. The appearance on CT is suspicious for multifocal pneumonia, though superimposed interstitial edema cannot be excluded. There is no evidence of pneumothorax.  The heart is borderline enlarged. No acute osseous abnormalities are seen. Postoperative change is seen about the thyroid bed. Clips are seen at the left axilla.  IMPRESSION: 1. Persistent fluffy bilateral central airspace opacification, perhaps slightly improved at the right lung base. Small bilateral pleural effusions seen. The appearance on CT is suspicious for significant multifocal pneumonia, though superimposed interstitial edema cannot be excluded. 2. Borderline cardiomegaly.   Electronically Signed   By: Garald Balding M.D.   On: Apr 14, 2013 01:31    Ct Chest Wo Contrast 03/27/2013    FINDINGS: Diffuse fluffy bilateral central airspace opacity is seen, most prominent at the upper lung lobes bilaterally. This is most compatible with diffuse pneumonia. Underlying edema cannot be excluded, given underlying small bilateral pleural effusions and interstitial prominence. No pneumothorax is seen.  Scattered coronary artery calcifications are seen. The heart is mildly enlarged. A mildly prominent right paratracheal node is seen, measuring 1.3 cm in short axis. No pericardial effusion is identified. The great vessels  are grossly unremarkable in appearance. The patient is status post thyroidectomy. No axillary lymphadenopathy is seen.  The visualized portions of the liver and the spleen are unremarkable in appearance. Severe bilateral renal atrophy is seen, with scattered calcifications noted bilaterally. A small hiatal hernia is noted.  IMPRESSION: 1. Diffuse fluffy bilateral central airspace opacification, most prominent at the upper lung lobes. This is most compatible with diffuse multifocal pneumonia. Underlying edema cannot be excluded,  given small bilateral pleural effusions and interstitial prominence. 2. Scattered coronary calcifications seen. 3. Mild cardiomegaly. 4. Mildly prominent right paratracheal node, measuring 1.3 cm in short axis. 5. Severe bilateral renal atrophy, with scattered associated calcifications. 6. Small hiatal hernia seen.   Electronically Signed   By: Garald Balding M.D.   On: 03/27/2013 21:48    ASSESMENT:   * Microcytic anemia, chronic. Drop of nearly 3 grams over 7 days.  One unit PRBCs given 03/28/13. * Limited rectal bleeding occurred after kayexelate caused copious diarrhea. Resolved.  Amount of rectal bleeding does not seem sufficient to explain the level of anemia.  Rule out hemorrhoidal, rule out rectal/anal irritation from the diarrhea. Rule out radiation proctitis.  * Diffuse multifocal PNA. ID following, immunosuppressed. Pulmonary does not plan bronchoscopy.  * CAD. 03/2009 non STEMI: s/p BMS to LAD, chronically occluded RCA. On chronic Plavix, this now on hold as of today.  acute diastolic and systolic heart failure. EF 45 to 50%  Note Dr Kingsley Plan statement that pt is "Plavix nonresponder - Plavix discontinued by IM due to bloody stools and hemoptysis. Would not change to Effient in future due to possible hx CVA" * S/p renal transplant 2001. Chronic immunosuppressives. Addition of Diltiazem 1/14 felt to have led to Sirolimus toxicity and ARF.  Improving renal function.  * Prostate cancer. Initial diagnoses ~ 2000/2001. Treated with radiation. Per pt, Dr Storm Frisk and resident's notes pt has T spine mets. I can not find studies to support this diagnoses of mets. Pt says he is scheduled for MRI with biopsy today, again I am unable to find this in Epic.  * IDDM.     PLAN   *  Colonoscopy and EGD will be arranged for 1/23. See orders for clears and bowel prep to start 1/22    Azucena Freed  03/29/2013, 11:31 AM Pager: [phone] Attending MD note:   I have taken a history, examined the  patient, and reviewed the chart. I agree with the Advanced Practitioner's impression and recommendations. Heme positive anemia, low volume hematochezia, had SVT last night. Plan EGD/colon 03/31/2013  [facility] Pager # [phone]

## 2013-03-29 NOTE — Progress Notes (Signed)
Internal Medicine Attending  Date: 03/29/2013  Patient name: Joshua [name] Medical record number: [id number] Date of birth: 1950/11/21 Age: 63 y.o. Gender: male  I saw and evaluated the patient, and discussed his care with housestaff.  I reviewed the resident's note by Dr. Mechele Claude and I agree with the resident's findings and plans as documented in her note.

## 2013-03-29 NOTE — Progress Notes (Signed)
[facility]    Date of Admission:  03/20/2013   Total days of antibiotics 2           ID: Joshua [name] is a 63 y.o. male who is an immunocompromised host found to have bilateral infiltrates but also hemoptysis concern for pna vs. Pulmonary aveolar hemorrhage Principal Problem:   Unstable angina Active Problems:   Occlusion and stenosis of carotid artery without mention of cerebral infarction   Coronary artery disease   Hypertension   Dyslipidemia, goal LDL below 70   Hypothyroidism   Back pain   Prostate cancer   Protein-calorie malnutrition, severe   Chronic diastolic CHF (congestive heart failure)   Atrial tachycardia   Anemia   Nonspecific abnormal finding in stool contents    Subjective: Afebrile, no longer have blood streaked sputum  Medications:  . aspirin  81 mg Oral Daily  . atorvastatin  80 mg Oral q1800  . bisacodyl  5 mg Oral BID  . ceFEPime (MAXIPIME) IV  2 g Intravenous Q24H  . feeding supplement (ENSURE COMPLETE)  237 mL Oral TID BM  . insulin aspart  0-9 Units Subcutaneous TID WC  . insulin glargine  28 Units Subcutaneous Daily  . isosorbide mononitrate  30 mg Oral Daily  . levalbuterol  1.25 mg Nebulization Once  . levothyroxine  150 mcg Oral Daily  . methocarbamol  500 mg Oral BID  . metoprolol tartrate  50 mg Oral Q6H  . mycophenolate  720 mg Oral BID  . pantoprazole  40 mg Oral Daily  . regadenoson  0.4 mg Intravenous Once  . sirolimus  3 mg Oral Daily  . sodium bicarbonate  1,300 mg Oral BID  . vancomycin  1,000 mg Intravenous Q24H    Objective: Vital signs in last 24 hours: Temp:  [98.7 F (37.1 C)-100 F (37.8 C)] 98.7 F (37.1 C) (01/21 1122) Pulse Rate:  [80-135] 80 (01/21 1122) Resp:  [16-20] 19 (01/21 1122) BP: (126-170)/(50-88) 141/60 mmHg (01/21 1122) SpO2:  [88 %-96 %] 94 % (01/21 1122) Weight:  [180 lb 1.9 oz (81.7 kg)-180 lb 5.4 oz (81.8 kg)] 180 lb 5.4 oz (81.8 kg) (01/21  0400)  Constitutional: He is oriented to person, place, and time. He appears well-developed and well-nourished. No distress.  HENT:  Mouth/Throat: Oropharynx is clear and moist. No oropharyngeal exudate.  Cardiovascular: Normal rate, regular rhythm and normal heart sounds. Exam reveals no gallop and no friction rub.  No murmur heard.  Pulmonary/Chest: Effort normal and breath sounds normal. No respiratory distress. Bilateral wheezing Abdominal: Soft. Bowel sounds are normal. He exhibits no distension. There is no tenderness.  Lymphadenopathy:  He has no cervical adenopathy.  Neurological: He is alert and oriented to person, place, and time.  Skin: Skin is warm and dry. No rash noted. No erythema.  Psychiatric: He has a normal mood and affect. His behavior is normal.    Lab Results  Recent Labs  03/28/13 0620  03/28/13 1040 03/28/13 1745 03/29/13 0352  WBC 11.0*  --   --   --  10.2  HGB 5.7*  < >  --  8.1* 7.2*  HCT 18.1*  < >  --  25.9* 22.5*  NA 130*  --  131*  --  132*  K 5.3  --  4.8  --  4.0  CL 88*  --  88*  --  90*  CO2 19  --  20  --  22  BUN 50*  --  49*  --  46*  CREATININE 2.50*  --  2.53*  --  2.31*  < > = values in this interval not displayed. Liver Panel  Recent Labs  03/27/13 1300  PROT 7.5  ALBUMIN 2.1*  AST 45*  ALT 26  ALKPHOS 385*  BILITOT 0.3  BILIDIR <0.2  IBILI NOT CALCULATED    Microbiology: Sputum = NOPF Blood = ngtd RVP = negative Studies/Results: Dg Chest 2 View  03/29/2013   CLINICAL DATA:  Shortness of breath and cough.  EXAM: CHEST  2 VIEW  COMPARISON:  Chest radiograph and CT of the chest performed 03/27/2013  FINDINGS: The lungs are well-aerated. Fluffy bilateral central airspace opacification is again noted bilaterally, perhaps slightly improved from the prior study at the right lung base. Small bilateral pleural effusions are noted. The appearance on CT is suspicious for multifocal pneumonia, though superimposed interstitial edema  cannot be excluded. There is no evidence of pneumothorax.  The heart is borderline enlarged. No acute osseous abnormalities are seen. Postoperative change is seen about the thyroid bed. Clips are seen at the left axilla.  IMPRESSION: 1. Persistent fluffy bilateral central airspace opacification, perhaps slightly improved at the right lung base. Small bilateral pleural effusions seen. The appearance on CT is suspicious for significant multifocal pneumonia, though superimposed interstitial edema cannot be excluded. 2. Borderline cardiomegaly.   Electronically Signed   By: Garald Balding M.D.   On: 03/29/2013 01:31   Ct Chest Wo Contrast  03/27/2013   CLINICAL DATA:  Chest pain and shortness of breath for several months. Low-grade fever. Acute renal failure in transplant patient.  EXAM: CT CHEST WITHOUT CONTRAST  TECHNIQUE: Multidetector CT imaging of the chest was performed following the standard protocol without IV contrast.  COMPARISON:  Chest radiograph performed earlier today at 11:45 a.m.  FINDINGS: Diffuse fluffy bilateral central airspace opacity is seen, most prominent at the upper lung lobes bilaterally. This is most compatible with diffuse pneumonia. Underlying edema cannot be excluded, given underlying small bilateral pleural effusions and interstitial prominence. No pneumothorax is seen.  Scattered coronary artery calcifications are seen. The heart is mildly enlarged. A mildly prominent right paratracheal node is seen, measuring 1.3 cm in short axis. No pericardial effusion is identified. The great vessels are grossly unremarkable in appearance. The patient is status post thyroidectomy. No axillary lymphadenopathy is seen.  The visualized portions of the liver and the spleen are unremarkable in appearance. Severe bilateral renal atrophy is seen, with scattered calcifications noted bilaterally. A small hiatal hernia is noted.  IMPRESSION: 1. Diffuse fluffy bilateral central airspace opacification, most  prominent at the upper lung lobes. This is most compatible with diffuse multifocal pneumonia. Underlying edema cannot be excluded, given small bilateral pleural effusions and interstitial prominence. 2. Scattered coronary calcifications seen. 3. Mild cardiomegaly. 4. Mildly prominent right paratracheal node, measuring 1.3 cm in short axis. 5. Severe bilateral renal atrophy, with scattered associated calcifications. 6. Small hiatal hernia seen.   Electronically Signed   By: Garald Balding M.D.   On: 03/27/2013 21:48     Assessment/Plan: hcap vs. Other pulm pathology = continue with vanco and cefepime, currently on day 2. Await for cutlure results to determine if need to change antibiotics. Anticipate 8 day course of therapy unless PsA or MRSA is found. If he continues to improve, can switch to an oral regimen.   Baxter Flattery [facility] Cell: [phone] Pager: [phone]  03/29/2013, 1:36 PM

## 2013-03-29 NOTE — Progress Notes (Signed)
Paged dr.  Donivan Scull: hr 130's, to give 1800 lopressor now, no prn's rec'd. Will continue to monitor.

## 2013-03-29 NOTE — Progress Notes (Addendum)
HR 130-140s sustaining in what appears to be SVT, BP 170/88. Patient states that his heart feels like it's racing. MD notified. Ordered to give schedule PO lopressor 25 mg early. Will continue to monitor.   Lum Babe, RN

## 2013-03-29 NOTE — Progress Notes (Addendum)
Patient: Joshua [name] / Admit Date: 03/20/2013 / Date of Encounter: 03/29/2013, 9:01 AM   Subjective  No CP or SOB. No orthopnea, LEE. Still coughed up blood tinged sputum last night but seems to be improving. Has not had BM recently. Was able to feel tachypalps while in a-tach.  Objective   Telemetry: NSR, paroxysms of atrial tach, triplet of PVCs  Physical Exam: Blood pressure 143/63, pulse 85, temperature 98.9 F (37.2 C), temperature source Oral, resp. rate 18, height $RemoveBe'5\' 11"'LvfUhgRSC$  (1.803 m), weight 180 lb 5.4 oz (81.8 kg), SpO2 92.00%. General: Well developed, well nourished AAM, in no acute distress. Laying 15 degrees in bed Head: Normocephalic, atraumatic, sclera non-icteric, no xanthomas, nares are without discharge. Neck:  JVP not elevated. Lungs: Diffuse wheezing bilaterally, no rales or rhonchi. Breathing is unlabored. Heart: RRR S1 S2 without murmurs, rubs, or gallops.  Abdomen: Soft, non-tender, non-distended with normoactive bowel sounds. No rebound/guarding. Extremities: No clubbing or cyanosis. No edema. Distal pedal pulses are 2+ and equal bilaterally. Neuro: Alert and oriented X 3. Moves all extremities spontaneously. Psych:  Responds to questions appropriately with a normal affect.   Intake/Output Summary (Last 24 hours) at 03/29/13 0901 Last data filed at 03/29/13 0816  Gross per 24 hour  Intake 658.33 ml  Output    900 ml  Net -241.67 ml    Inpatient Medications:  . aspirin  81 mg Oral Daily  . atorvastatin  80 mg Oral q1800  . ceFEPime (MAXIPIME) IV  2 g Intravenous Q24H  . feeding supplement (ENSURE COMPLETE)  237 mL Oral TID BM  . insulin aspart  0-9 Units Subcutaneous TID WC  . insulin glargine  28 Units Subcutaneous Daily  . isosorbide mononitrate  30 mg Oral Daily  . levothyroxine  150 mcg Oral Daily  . methocarbamol  500 mg Oral BID  . metoprolol tartrate  50 mg Oral Q6H  . mycophenolate  720 mg Oral BID  . pantoprazole  40 mg Oral Daily  .  regadenoson  0.4 mg Intravenous Once  . sirolimus  3 mg Oral Daily  . sodium bicarbonate  1,300 mg Oral BID  . vancomycin  1,000 mg Intravenous Q24H   Infusions:    Labs:  Recent Labs  03/28/13 1040 03/29/13 0352  NA 131* 132*  K 4.8 4.0  CL 88* 90*  CO2 20 22  GLUCOSE 255* 204*  BUN 49* 46*  CREATININE 2.53* 2.31*  CALCIUM 7.9* 7.5*    Recent Labs  03/27/13 1300  AST 45*  ALT 26  ALKPHOS 385*  BILITOT 0.3  PROT 7.5  ALBUMIN 2.1*    Recent Labs  03/28/13 0620  03/28/13 1745 03/29/13 0352  WBC 11.0*  --   --  10.2  NEUTROABS 8.7*  --   --  7.7  HGB 5.7*  < > 8.1* 7.2*  HCT 18.1*  < > 25.9* 22.5*  MCV 73.0*  --   --  73.5*  PLT 363  --   --  318  < > = values in this interval not displayed.  Recent Labs  03/27/13 0930 03/27/13 1615 03/27/13 2310  CKTOTAL 99  --   --   CKMB 1.2  --   --   TROPONINI <0.30 <0.30 <0.30    Radiology/Studies:  Dg Chest 2 View  03/29/2013   CLINICAL DATA:  Shortness of breath and cough.  EXAM: CHEST  2 VIEW  COMPARISON:  Chest radiograph and CT of the chest performed 03/27/2013  FINDINGS: The lungs are well-aerated. Fluffy bilateral central airspace opacification is again noted bilaterally, perhaps slightly improved from the prior study at the right lung base. Small bilateral pleural effusions are noted. The appearance on CT is suspicious for multifocal pneumonia, though superimposed interstitial edema cannot be excluded. There is no evidence of pneumothorax.  The heart is borderline enlarged. No acute osseous abnormalities are seen. Postoperative change is seen about the thyroid bed. Clips are seen at the left axilla.  IMPRESSION: 1. Persistent fluffy bilateral central airspace opacification, perhaps slightly improved at the right lung base. Small bilateral pleural effusions seen. The appearance on CT is suspicious for significant multifocal pneumonia, though superimposed interstitial edema cannot be excluded. 2. Borderline  cardiomegaly.   Electronically Signed   By: Garald Balding M.D.   On: 03/29/2013 01:31   Dg Chest 2 View  03/27/2013   CLINICAL DATA:  Cough, hemoptysis  EXAM: CHEST  2 VIEW  COMPARISON:  03/23/2013  FINDINGS: Multifocal patchy airspace opacities, most prominent in the right perihilar region, mildly increased. Differential considerations include mild to moderate interstitial edema or possibly multifocal infection.  Suspected small left pleural effusion.  No pneumothorax.  Cardiomegaly.  Surgical clips overlying the bilateral neck.  IMPRESSION: Multifocal patchy airspace opacities, most prominent in the right perihilar region, mildly increased. Differential considerations include mild to moderate interstitial edema or possibly multifocal infection.  Cardiomegaly with suspected small left pleural effusion.   Electronically Signed   By: Julian Hy M.D.   On: 03/27/2013 13:00   Dg Chest 2 View  03/23/2013   CLINICAL DATA:  Chest pain with history of congestive heart failure  EXAM: CHEST  2 VIEW  COMPARISON:  03/20/2013  FINDINGS: Heart size upper normal. Mild vascular congestion has developed when compared to the prior study. There is bilateral perihilar peribronchial cuffing with indistinct bilateral perihilar vessels and mild hazy opacity in the perihilar regions bilaterally. There are no pleural effusions. Surgical clips in the thyroid bed again identified.  IMPRESSION: Findings suggest development of congestive heart failure with mild pulmonary edema.   Electronically Signed   By: Skipper Cliche M.D.   On: 03/23/2013 16:22   Dg Chest 2 View  03/20/2013   CLINICAL DATA:  Chest and back pain.  Hypertension.  EXAM: CHEST  2 VIEW  COMPARISON:  08/19/2011  FINDINGS: The lungs are clear without focal infiltrate, edema, pneumothorax or pleural effusion. Cardiopericardial silhouette is at upper limits of normal for size. Imaged bony structures of the thorax are intact. Surgical clips are seen in the thyroid  bed and left axilla.  IMPRESSION: Stable.  No acute cardiopulmonary process.   Electronically Signed   By: Misty Stanley M.D.   On: 03/20/2013 07:45   Ct Chest Wo Contrast  03/27/2013   CLINICAL DATA:  Chest pain and shortness of breath for several months. Low-grade fever. Acute renal failure in transplant patient.  EXAM: CT CHEST WITHOUT CONTRAST  TECHNIQUE: Multidetector CT imaging of the chest was performed following the standard protocol without IV contrast.  COMPARISON:  Chest radiograph performed earlier today at 11:45 a.m.  FINDINGS: Diffuse fluffy bilateral central airspace opacity is seen, most prominent at the upper lung lobes bilaterally. This is most compatible with diffuse pneumonia. Underlying edema cannot be excluded, given underlying small bilateral pleural effusions and interstitial prominence. No pneumothorax is seen.  Scattered coronary artery calcifications are seen. The heart is mildly enlarged. A mildly prominent right paratracheal node is seen, measuring 1.3 cm in short  axis. No pericardial effusion is identified. The great vessels are grossly unremarkable in appearance. The patient is status post thyroidectomy. No axillary lymphadenopathy is seen.  The visualized portions of the liver and the spleen are unremarkable in appearance. Severe bilateral renal atrophy is seen, with scattered calcifications noted bilaterally. A small hiatal hernia is noted.  IMPRESSION: 1. Diffuse fluffy bilateral central airspace opacification, most prominent at the upper lung lobes. This is most compatible with diffuse multifocal pneumonia. Underlying edema cannot be excluded, given small bilateral pleural effusions and interstitial prominence. 2. Scattered coronary calcifications seen. 3. Mild cardiomegaly. 4. Mildly prominent right paratracheal node, measuring 1.3 cm in short axis. 5. Severe bilateral renal atrophy, with scattered associated calcifications. 6. Small hiatal hernia seen.   Electronically Signed    By: Roanna Raider M.D.   On: 03/27/2013 21:48   Nm Bone Scan Whole Body  03/03/2013   CLINICAL DATA:  Pain.  Prostate cancer.  EXAM: NUCLEAR MEDICINE WHOLE BODY BONE SCAN  TECHNIQUE: Whole body anterior and posterior images were obtained approximately 3 hours after intravenous injection of radiopharmaceutical.  COMPARISON:  Bone scan 10/26/2007.  CT 02/28/2013.  RADIOPHARMACEUTICALS:  26.0 mCi Technetium-99 MDP  FINDINGS: Bilateral renal atrophy with a transplanted kidney right pelvis. No focal bony abnormality. No evidence of bony metastatic disease.  IMPRESSION: No evidence of bony metastatic disease or focal bony abnormality. Bilateral renal atrophy. Transplanted kidney right pelvis.   Electronically Signed   By: Maisie Fus  Register   On: 03/03/2013 14:02   US Renal  03/22/2013   CLINICAL DATA:  Acute kidney injury. Patient with bilateral renal transplant.  EXAM: RENAL/URINARY TRACT ULTRASOUND COMPLETE  COMPARISON:  CT, 02/28/2013  FINDINGS: Right Kidney:  Length: 7.4 cm. Kidney is echogenic. 15 mm cyst arises from the upper pole. There are probable additional tiny cysts. No hydronephrosis.  Left Kidney:  Native left kidney not visualized.  Transplant kidney: In the right lower quadrant to right upper pelvis, a transplant kidney is visualized. Is normal in overall size measuring 10.7 cm. It has normal parenchymal echogenicity. Small cyst arises from its lower pole measuring 1 cm. No other renal masses. No hydronephrosis.  Bladder:  Appears normal for degree of bladder distention.  IMPRESSION: 1. Transplant kidney is normal in appearance within a small lower pole cyst measuring 1 cm. 2. Atrophic native kidneys. Only the right was visualized. Is echogenic with at least 1 discrete small cysts. No hydronephrosis.   Electronically Signed   By: Amie Portland M.D.   On: 03/22/2013 15:50   Nm Myocar Multi W/spect W/wall Motion / Ef  03/22/2013   CLINICAL DATA:  63yo AA man with PMH of HTN, DM2, CAD with stents,  CAD, PVD s/p fem pop, prostate cancer treated with radiation, hx of thyroid cancer, h/o ESRD and kidney transplantation who presents for chest pain and back pain  EXAM: MYOCARDIAL IMAGING WITH SPECT (REST)  TECHNIQUE: Standard myocardial SPECT imaging was performed after resting intravenous injection of Tc-81m sestamibi. Quantitative gated imaging was also performed to evaluate left ventricular wall motion, and estimate left ventricular ejection fraction. As the patient's heart rate was 129 bpm when he presented for his "stress" images, no additional administration of Lexiscan was performed.  COMPARISON:  None.  FINDINGS: The Image quality is good. There is mild interference from visceral racer uptake. The left ventricle is moderately to severely dilated. There is a moderate in size, moderate-to-severe in intensity inferior and inferolateral perfusion abnormality seen on both rest and "stress"  images, possibly worse on the "stress" images. There is global hypokinesis of the left ventricle, with severe basal and mid inferior wall hypokinesis. EF 34%. The severity of LV dysfunction is out of proportion to the perfusion abnormality.  IMPRESSION: Interpretation of the study is severely limited by absence of a clear increase in coronary flow during acquisition of the "stress" images  There is evidence of severe ischemia at rest and/or scar in the right coronary artery distribution. This appears to be at least partly reversible.  The degree of LV dysfunction suggests multivessel CAD versus a component of superimposed nonischemic cardiomyopathy.  No previous studies are available for comparison   Electronically Signed   By: Sanda Klein   On: 03/22/2013 19:29   Nm Pulmonary Perf And Vent  03/20/2013   CLINICAL DATA:  Chest pain  EXAM: NUCLEAR MEDICINE VENTILATION - PERFUSION LUNG SCAN  Views: Anterior, posterior, left lateral, right lateral, RPO, LPO, RAO, LAO -ventilation and perfusion  Radionuclide: Technetium 6m  DTPA -ventilation; Technetium 78m macroaggregated albumin-perfusion  Dose:  40.0 mCi-ventilation; 6.0 mCi-perfusion  Route of administration: Inhalation -ventilation; intravenous -perfusion  COMPARISON:  Chest radiograph March 20, 2013  FINDINGS: Radiotracer uptake on the ventilation study is homogeneous and symmetric bilaterally.  Radiotracer uptake on the perfusion study is homogeneous and symmetric bilaterally.  There is no appreciable ventilation/perfusion mismatch.  IMPRESSION: No ventilation or perfusion abnormalities. Very low probability of pulmonary embolus.   Electronically Signed   By: Lowella Grip M.D.   On: 03/20/2013 16:15     Assessment and Plan  63 year old male CAD, history of renal transplant on immunosuppressants presented with CP and abnormal nuc treated with medical therapy. Also has a/c dCHF, mild AS, DM, HTN, with interval development of paroxysmal atrial tach, ARF/CKD in setting of possible sirolimus toxicity, hyperkalemia tx with kayexalate, fever, bilateral airspace disease of questionable etiology, hemoptysis and hematochezia and severe anemia.   1. CAD s/p prior stenting, admitted with unstable angina - nuc 1/14 with inferior scar with mild peri-infarct ischemia, the plan is to intensify medical therapy as he is s/p kidney transplant and GFR low. Imdur started. No further CP. VQ negative for PE. Continue ASA only if OK with primary team, may DC if hemoglobin continues to be an issue. Continue statin, BB, Imdur. P2Y12 is 365 indicating Plavix nonresponder - Plavix discontinued by IM due to bloody stools and hemoptysis. Would not change to Effient in future due to possible hx CVA. 2. Paroxysmal atrial tachycardia - cardizem discontinued due to interaction with sirolimus, changed to metoprolol. Likely exacerbated by acute illness. Increase metoprolol. 3. HCAP vs other infection in setting of immunosuppression- development of fever, hemoptysis and bilateral infiltrates vs  alveolar hemorhage this admission. Pulm also following. Ucx neg, resp cx normal flora, blood cx neg thus far. 4. GI bleed - low volume hematochezia, could be contributing to anemia. GI considering endoscopy. Plavix dc'd. 5. Acute on chronic microcytic anemia, felt multifactorial - nadir 5.7 on 1/20 s/p transfusion. Occurring in setting of GIB and hemoptysis.  6. Acute on chronic kidney disease s/p kidney transplant - thought possibly due to sirolimus toxicity. Stable. 7. History of prostate CA with markedly elevated PSA, elevated alk phos - per ID notes, the patient recently had scan indicating bony spine mets. I am unable to locate this in the chart. IM spoke with patient's uro-oncologist who does not have any further recommendations for work up at this time.  8. Acute on chronic diastolic CHF - LV dysfunction  with EF 45-50% by echo this admission. Weight is beginning to rise but still down from where he was earlier this month. Monitor. May need careful diuresis tomorrow if Cr stable. 9. NSVT - continue BB. Keep K=/>4.0. 10. Mild-mod AS by echo - follow clinically as outpatient.  11. Hypothyroidism, status post papillary thyroid cancer removal - TSH elevated but free T4 low normal.  12. DM, uncontrolled - A1C 7.8 - Lantus/SSI.  Consider SCDs for DVT ppx given discontinuation of anticoag.  Signed, Melina Copa PA-C  Agree with above, personally seen and examined. Increased episodes of PAT overnight, HR 130's.  Will increase metoprolol to $RemoveBefor'50mg'[id number]$  Q6.  CXR, personally viewed, continues to demonstrate bilateral fluffy opacification. ?PNA with superimposed edema. I would not be opposed to low dose lasix ($RemoveBef'20mg'eVQWbdYPnC$  IV) but will defer to primary team in conjunction with nephrology given his recent ARF which is improving. Did receive 2 units of PRBC yesterday. Diffuse wheeze noted. I would still like to administer metoprolol, cardioselective. Other choices for rate control are not reasonable (diltiazem).    Agree with Plavix discontinuation.   Multiple consultants notes reviewed.    Candee Furbish, MD

## 2013-03-29 NOTE — Progress Notes (Signed)
Subjective: Joshua [name] feels well this morning. He denies having any more diarrhea, bloody stools since two nights ago. Patient had one episode of cough producing small amount of blood tinged sputum, though not frankly bloody. Cough remains mild per patient. No chills or SOB. Patient was transferred to SDU yesterday given nursing staff comfort level last night (see prior note by Dr. Margart Sickles). Patient's telemetry showed intermittent atrial tachycardia to the 130s-170s overnight. He has new oxygen requirement as I turned the O2 off during my exam and he desatted to 89%.   Objective: Vital signs in last 24 hours: Filed Vitals:   03/29/13 0743 03/29/13 1122 03/29/13 1403 03/29/13 1519  BP: 143/63 141/60    Pulse: 85 80    Temp: 98.9 F (37.2 C) 98.7 F (37.1 C)  99.7 F (37.6 C)  TempSrc: Oral Oral  Oral  Resp: 18 19    Height:      Weight:      SpO2: 92% 94% 93%    Weight change:   Intake/Output Summary (Last 24 hours) at 03/29/13 1544 Last data filed at 03/29/13 1400  Gross per 24 hour  Intake 1275.83 ml  Output    950 ml  Net 325.83 ml   Physical Exam General: lying comfortably in bed HEENT: [place]/AT, vision grossly intact Cardiac: RRR, harsh systolic ejection murmur loudest over R sternal border Pulm: diffuse wheezing w/ some crackles bilaterally, no increased WOB Abd: soft, nontender, nondistended, BS present Ext: warm and well perfused, no pedal edema Neuro: alert and oriented X3, cranial nerves II-XII grossly intact, moves all extremities spontaneously  Lab Results: Basic Metabolic Panel:  Recent Labs Lab 03/28/13 1040 03/29/13 0352  NA 131* 132*  K 4.8 4.0  CL 88* 90*  CO2 20 22  GLUCOSE 255* 204*  BUN 49* 46*  CREATININE 2.53* 2.31*  CALCIUM 7.9* 7.5*   Liver Function Tests:  Recent Labs Lab 03/27/13 1300  AST 45*  ALT 26  ALKPHOS 385*  BILITOT 0.3  PROT 7.5  ALBUMIN 2.1*   CBC:  Recent Labs Lab 03/28/13 0620  03/28/13 1745 03/29/13 0352   WBC 11.0*  --   --  10.2  NEUTROABS 8.7*  --   --  7.7  HGB 5.7*  < > 8.1* 7.2*  HCT 18.1*  < > 25.9* 22.5*  MCV 73.0*  --   --  73.5*  PLT 363  --   --  318  < > = values in this interval not displayed. Cardiac Enzymes:  Recent Labs Lab 03/27/13 0930 03/27/13 1615 03/27/13 2310  CKTOTAL 99  --   --   CKMB 1.2  --   --   TROPONINI <0.30 <0.30 <0.30   CBG:  Recent Labs Lab 03/28/13 0756 03/28/13 1158 03/28/13 1626 03/28/13 2122 03/29/13 0758 03/29/13 1145  GLUCAP 308* 211* 242* 124* 200* 161*   Iron/TIBC/Ferritin    Component Value Date/Time   IRON 17* 03/27/2013 0930   TIBC 166* 03/27/2013 0930   FERRITIN 5064* 03/27/2013 0930   BNP    Component Value Date/Time   PROBNP 19450.0* 03/27/2013 1615    Urinalysis    Component Value Date/Time   COLORURINE YELLOW 03/27/2013 1834   APPEARANCEUR CLOUDY* 03/27/2013 1834   LABSPEC 1.027 03/27/2013 1834   PHURINE 5.0 03/27/2013 1834   GLUCOSEU NEGATIVE 03/27/2013 1834   HGBUR SMALL* 03/27/2013 1834   BILIRUBINUR SMALL* 03/27/2013 [place] 03/27/2013 1834   PROTEINUR 100* 03/27/2013 1834  UROBILINOGEN 1.0 03/27/2013 1834   NITRITE NEGATIVE 03/27/2013 1834   LEUKOCYTESUR NEGATIVE 03/27/2013 1834   Micro Results: Recent Results (from the past 240 hour(s))  CULTURE, BLOOD (ROUTINE X 2)     Status: None   Collection Time    03/21/13  9:40 AM      Result Value Range Status   Specimen Description BLOOD RIGHT HAND   Final   Special Requests BOTTLES DRAWN AEROBIC AND ANAEROBIC 10CC   Final   Culture  Setup Time     Final   Value: 03/21/2013 16:16     Performed at [facility]   Culture     Final   Value: NO GROWTH 5 DAYS     Performed at [facility]   Report Status 03/27/2013 FINAL   Final  CULTURE, BLOOD (ROUTINE X 2)     Status: None   Collection Time    03/21/13  9:50 AM      Result Value Range Status   Specimen Description BLOOD RIGHT HAND   Final   Special Requests BOTTLES DRAWN  AEROBIC ONLY 5CC   Final   Culture  Setup Time     Final   Value: 03/21/2013 16:16     Performed at [facility]   Culture     Final   Value: NO GROWTH 5 DAYS     Performed at [facility]   Report Status 03/27/2013 FINAL   Final  CULTURE, BLOOD (ROUTINE X 2)     Status: None   Collection Time    03/27/13  5:25 PM      Result Value Range Status   Specimen Description BLOOD RIGHT HAND   Final   Special Requests BOTTLES DRAWN AEROBIC ONLY 10CC   Final   Culture  Setup Time     Final   Value: 03/27/2013 22:06     Performed at [facility]   Culture     Final   Value:        BLOOD CULTURE RECEIVED NO GROWTH TO DATE CULTURE WILL BE HELD FOR 5 DAYS BEFORE ISSUING A FINAL NEGATIVE REPORT     Performed at [facility]   Report Status PENDING   Incomplete  CULTURE, BLOOD (ROUTINE X 2)     Status: None   Collection Time    03/27/13  5:28 PM      Result Value Range Status   Specimen Description BLOOD RIGHT HAND   Final   Special Requests BOTTLES DRAWN AEROBIC ONLY 10CC   Final   Culture  Setup Time     Final   Value: 03/27/2013 22:05     Performed at [facility]   Culture     Final   Value:        BLOOD CULTURE RECEIVED NO GROWTH TO DATE CULTURE WILL BE HELD FOR 5 DAYS BEFORE ISSUING A FINAL NEGATIVE REPORT     Performed at [facility]   Report Status PENDING   Incomplete  RESPIRATORY VIRUS PANEL     Status: None   Collection Time    03/27/13  6:14 PM      Result Value Range Status   Source - RVPAN NASAL SWAB   Corrected   Comment: CORRECTED ON 01/20 AT 1804: PREVIOUSLY REPORTED AS NASAL SWAB   Respiratory Syncytial Virus A NOT DETECTED   Final   Respiratory Syncytial Virus B NOT DETECTED   Final   Influenza A  NOT DETECTED   Final   Influenza B NOT DETECTED   Final   Parainfluenza 1 NOT DETECTED   Final   Parainfluenza 2 NOT DETECTED   Final   Parainfluenza 3 NOT DETECTED   Final   Metapneumovirus NOT DETECTED   Final   Rhinovirus  NOT DETECTED   Final   Adenovirus NOT DETECTED   Final   Influenza A H1 NOT DETECTED   Final   Influenza A H3 NOT DETECTED   Final   Comment: (NOTE)           Normal Reference Range for each Analyte: NOT DETECTED     Testing performed using the Luminex xTAG Respiratory Viral Panel test     kit.     This test was developed and its performance characteristics determined     by [facility]. It has not been cleared or approved by the [place]     Food and Drug Administration. This test is used for clinical purposes.     It should not be regarded as investigational or for research. This     laboratory is certified under the [place] (CLIA) as qualified to perform high complexity     clinical laboratory testing.     Performed at [place], EXPECTORATED SPUTUM-ASSESSMENT     Status: None   Collection Time    03/27/13  6:34 PM      Result Value Range Status   Specimen Description SPUTUM   Final   Special Requests NONE   Final   Sputum evaluation     Final   Value: THIS SPECIMEN IS ACCEPTABLE. RESPIRATORY CULTURE REPORT TO FOLLOW.   Report Status 03/27/2013 FINAL   Final  URINE CULTURE     Status: None   Collection Time    03/27/13  6:34 PM      Result Value Range Status   Specimen Description URINE, CLEAN CATCH   Final   Special Requests NONE   Final   Culture  Setup Time     Final   Value: 03/28/2013 01:05     Performed at [facility]     Final   Value: NO GROWTH     Performed at [facility]   Culture     Final   Value: NO GROWTH     Performed at [facility]   Report Status 03/28/2013 FINAL   Final  CULTURE, RESPIRATORY (NON-EXPECTORATED)     Status: None   Collection Time    03/27/13  6:34 PM      Result Value Range Status   Specimen Description SPUTUM   Final   Special Requests NONE   Final   Gram Stain     Final   Value: RARE WBC PRESENT,BOTH PMN AND MONONUCLEAR       NO SQUAMOUS EPITHELIAL CELLS SEEN     RARE GRAM POSITIVE COCCI IN PAIRS     RARE GRAM POSITIVE RODS     Performed at [facility]   Culture     Final   Value: NORMAL OROPHARYNGEAL FLORA     Performed at [facility]   Report Status PENDING   Incomplete  MRSA PCR SCREENING     Status: None   Collection Time    03/28/13 10:42 PM      Result Value Range Status   MRSA by PCR NEGATIVE  NEGATIVE Final   Comment:            The GeneXpert MRSA Assay (FDA     approved for NASAL specimens     only), is one component of a     comprehensive MRSA colonization     surveillance program. It is not     intended to diagnose MRSA     infection nor to guide or     monitor treatment for     MRSA infections.    MISC: sirolimus level pending quantiferon negative PTH 78.5 (H) CMV pcr pending EBV pcr negative BK virus pcr negative Respiratory virus panel negative Pneumocystis smear pending Aspergillus antigen pending Sputum culture- gram stain shows gram + cocci in pairs w/ gram + rods; fungal and AFB culture pending UCX NGTD BCX NGTD x 2 FOBT negative, repeat pending FENa- .2%  RISRSLT Dg Chest 2 View  03/27/2013   CLINICAL DATA:  Cough, hemoptysis  EXAM: CHEST  2 VIEW  COMPARISON:  03/23/2013  FINDINGS: Multifocal patchy airspace opacities, most prominent in the right perihilar region, mildly increased. Differential considerations include mild to moderate interstitial edema or possibly multifocal infection.  Suspected small left pleural effusion.  No pneumothorax.  Cardiomegaly.  Surgical clips overlying the bilateral neck.  IMPRESSION: Multifocal patchy airspace opacities, most prominent in the right perihilar region, mildly increased. Differential considerations include mild to moderate interstitial edema or possibly multifocal infection.  Cardiomegaly with suspected small left pleural effusion.   Electronically Signed   By: Charline Bills M.D.   On: 03/27/2013 13:00    Dg Chest 2 View  03/23/2013   CLINICAL DATA:  Chest pain with history of congestive heart failure  EXAM: CHEST  2 VIEW  COMPARISON:  03/20/2013  FINDINGS: Heart size upper normal. Mild vascular congestion has developed when compared to the prior study. There is bilateral perihilar peribronchial cuffing with indistinct bilateral perihilar vessels and mild hazy opacity in the perihilar regions bilaterally. There are no pleural effusions. Surgical clips in the thyroid bed again identified.  IMPRESSION: Findings suggest development of congestive heart failure with mild pulmonary edema.   Electronically Signed   By: Esperanza Heir M.D.   On: 03/23/2013 16:22   Dg Chest 2 View  03/20/2013   CLINICAL DATA:  Chest and back pain.  Hypertension.  EXAM: CHEST  2 VIEW  COMPARISON:  08/19/2011  FINDINGS: The lungs are clear without focal infiltrate, edema, pneumothorax or pleural effusion. Cardiopericardial silhouette is at upper limits of normal for size. Imaged bony structures of the thorax are intact. Surgical clips are seen in the thyroid bed and left axilla.  IMPRESSION: Stable.  No acute cardiopulmonary process.   Electronically Signed   By: Kennith Center M.D.   On: 03/20/2013 07:45   Ct Chest Wo Contrast  03/27/2013   CLINICAL DATA:  Chest pain and shortness of breath for several months. Low-grade fever. Acute renal failure in transplant patient.  EXAM: CT CHEST WITHOUT CONTRAST  TECHNIQUE: Multidetector CT imaging of the chest was performed following the standard protocol without IV contrast.  COMPARISON:  Chest radiograph performed earlier today at 11:45 a.m.  FINDINGS: Diffuse fluffy bilateral central airspace opacity is seen, most prominent at the upper lung lobes bilaterally. This is most compatible with diffuse pneumonia. Underlying edema cannot be excluded, given underlying small bilateral pleural effusions and interstitial prominence. No pneumothorax is seen.  Scattered coronary artery calcifications  are seen. The heart is mildly enlarged. A mildly prominent right  paratracheal node is seen, measuring 1.3 cm in short axis. No pericardial effusion is identified. The great vessels are grossly unremarkable in appearance. The patient is status post thyroidectomy. No axillary lymphadenopathy is seen.  The visualized portions of the liver and the spleen are unremarkable in appearance. Severe bilateral renal atrophy is seen, with scattered calcifications noted bilaterally. A small hiatal hernia is noted.  IMPRESSION: 1. Diffuse fluffy bilateral central airspace opacification, most prominent at the upper lung lobes. This is most compatible with diffuse multifocal pneumonia. Underlying edema cannot be excluded, given small bilateral pleural effusions and interstitial prominence. 2. Scattered coronary calcifications seen. 3. Mild cardiomegaly. 4. Mildly prominent right paratracheal node, measuring 1.3 cm in short axis. 5. Severe bilateral renal atrophy, with scattered associated calcifications. 6. Small hiatal hernia seen.   Electronically Signed   By: Garald Balding M.D.   On: 03/27/2013 21:48   Nm Bone Scan Whole Body  03/03/2013   CLINICAL DATA:  Pain.  Prostate cancer.  EXAM: NUCLEAR MEDICINE WHOLE BODY BONE SCAN  TECHNIQUE: Whole body anterior and posterior images were obtained approximately 3 hours after intravenous injection of radiopharmaceutical.  COMPARISON:  Bone scan 10/26/2007.  CT 02/28/2013.  RADIOPHARMACEUTICALS:  26.0 mCi Technetium-99 MDP  FINDINGS: Bilateral renal atrophy with a transplanted kidney right pelvis. No focal bony abnormality. No evidence of bony metastatic disease.  IMPRESSION: No evidence of bony metastatic disease or focal bony abnormality. Bilateral renal atrophy. Transplanted kidney right pelvis.   Electronically Signed   By: Marcello Moores  Register   On: 03/03/2013 14:02   US Renal  03/22/2013   CLINICAL DATA:  Acute kidney injury. Patient with bilateral renal transplant.  EXAM:  RENAL/URINARY TRACT ULTRASOUND COMPLETE  COMPARISON:  CT, 02/28/2013  FINDINGS: Right Kidney:  Length: 7.4 cm. Kidney is echogenic. 15 mm cyst arises from the upper pole. There are probable additional tiny cysts. No hydronephrosis.  Left Kidney:  Native left kidney not visualized.  Transplant kidney: In the right lower quadrant to right upper pelvis, a transplant kidney is visualized. Is normal in overall size measuring 10.7 cm. It has normal parenchymal echogenicity. Small cyst arises from its lower pole measuring 1 cm. No other renal masses. No hydronephrosis.  Bladder:  Appears normal for degree of bladder distention.  IMPRESSION: 1. Transplant kidney is normal in appearance within a small lower pole cyst measuring 1 cm. 2. Atrophic native kidneys. Only the right was visualized. Is echogenic with at least 1 discrete small cysts. No hydronephrosis.   Electronically Signed   By: Lajean Manes M.D.   On: 03/22/2013 15:50   Nm Myocar Multi W/spect W/wall Motion / Ef  03/22/2013   CLINICAL DATA:  63yo AA man with PMH of HTN, DM2, CAD with stents, CAD, PVD s/p fem pop, prostate cancer treated with radiation, hx of thyroid cancer, h/o ESRD and kidney transplantation who presents for chest pain and back pain  EXAM: MYOCARDIAL IMAGING WITH SPECT (REST)  TECHNIQUE: Standard myocardial SPECT imaging was performed after resting intravenous injection of Tc-61m sestamibi. Quantitative gated imaging was also performed to evaluate left ventricular wall motion, and estimate left ventricular ejection fraction. As the patient's heart rate was 129 bpm when he presented for his "stress" images, no additional administration of Lexiscan was performed.  COMPARISON:  None.  FINDINGS: The Image quality is good. There is mild interference from visceral racer uptake. The left ventricle is moderately to severely dilated. There is a moderate in size, moderate-to-severe in intensity inferior and inferolateral  perfusion abnormality seen on  both rest and "stress" images, possibly worse on the "stress" images. There is global hypokinesis of the left ventricle, with severe basal and mid inferior wall hypokinesis. EF 34%. The severity of LV dysfunction is out of proportion to the perfusion abnormality.  IMPRESSION: Interpretation of the study is severely limited by absence of a clear increase in coronary flow during acquisition of the "stress" images  There is evidence of severe ischemia at rest and/or scar in the right coronary artery distribution. This appears to be at least partly reversible.  The degree of LV dysfunction suggests multivessel CAD versus a component of superimposed nonischemic cardiomyopathy.  No previous studies are available for comparison   Electronically Signed   By: Thurmon Fair   On: 03/22/2013 19:29   Nm Pulmonary Perf And Vent  03/20/2013   CLINICAL DATA:  Chest pain  EXAM: NUCLEAR MEDICINE VENTILATION - PERFUSION LUNG SCAN  Views: Anterior, posterior, left lateral, right lateral, RPO, LPO, RAO, LAO -ventilation and perfusion  Radionuclide: Technetium 29m DTPA -ventilation; Technetium 63m macroaggregated albumin-perfusion  Dose:  40.0 mCi-ventilation; 6.0 mCi-perfusion  Route of administration: Inhalation -ventilation; intravenous -perfusion  COMPARISON:  Chest radiograph March 20, 2013  FINDINGS: Radiotracer uptake on the ventilation study is homogeneous and symmetric bilaterally.  Radiotracer uptake on the perfusion study is homogeneous and symmetric bilaterally.  There is no appreciable ventilation/perfusion mismatch.  IMPRESSION: No ventilation or perfusion abnormalities. Very low probability of pulmonary embolus.   Electronically Signed   By: Bretta Bang M.D.   On: 03/20/2013 16:15   2D echo 03/22/2012- Study Conclusions  - Left ventricle: The cavity size was mildly dilated. Wall thickness was normal. Systolic function was mildly reduced. The estimated ejection fraction was in the range of 45% to 50%.  There is hypokinesis of the lateral myocardium. Doppler parameters are consistent with restrictive physiology, indicative of decreased left ventricular diastolic compliance and/or increased left atrial pressure. Doppler parameters are consistent with high ventricular filling pressure. - Aortic valve: Valve mobility was restricted. There was mild to moderate stenosis. Trivial regurgitation. Valve area: 1.56cm^2(VTI). Valve area: 1.47cm^2 (Vmax). - Mitral valve: Mild regurgitation. - Left atrium: The atrium was mildly dilated. - Pulmonary arteries: Systolic pressure was mildly increased. PA peak pressure: 15mm Hg (S).  Medications: I have reviewed the patient's current medications. Scheduled Meds: . aspirin  81 mg Oral Daily  . atorvastatin  80 mg Oral q1800  . bisacodyl  5 mg Oral BID  . ceFEPime (MAXIPIME) IV  2 g Intravenous Q24H  . feeding supplement (ENSURE COMPLETE)  237 mL Oral TID BM  . insulin aspart  0-9 Units Subcutaneous TID WC  . insulin glargine  28 Units Subcutaneous Daily  . isosorbide mononitrate  30 mg Oral Daily  . levothyroxine  150 mcg Oral Daily  . methocarbamol  500 mg Oral BID  . metoprolol tartrate  50 mg Oral Q6H  . mycophenolate  720 mg Oral BID  . pantoprazole  40 mg Oral Daily  . regadenoson  0.4 mg Intravenous Once  . sirolimus  3 mg Oral Daily  . sodium bicarbonate  1,300 mg Oral BID  . vancomycin  1,000 mg Intravenous Q24H   Continuous Infusions:   PRN Meds:.sodium chloride, levalbuterol, oxyCODONE, sodium chloride  Assessment/Plan:  HCAP: Chest CT scan showed multifocal pneumonia w/ small b/l pleural effusions. Afebrile overnight, leukocytosis improving. Patient feels like he is improving. Lungs are still coarse bilaterally. He does have new O2 requirement, now  on 3LPM and satting well. Will keep in SDU x 1 more day. Prior hemoptysis has improved. -vanc/cefepime day 2-->transition to PO tomorrow if he continues to improve (total of 8 day  course) -added xopenex nebs for wheezing -sputum gram stain showed gram + cocci in pairs and gram + rods  -ID consult- checking sputum for aerobic, fungal, AFB culture, BCx x 2 (NGTD),  aspergillus Ag, CMV PCR, EBV pcr (negative), beta D glucan (fungitell assay), pneumocystis stain--so far work up is noncontributory  -Pulmonology consult- no need for bronchoscopy at this time -PT/OT  Acute GI bleed w/ dropping hemoglobin: No more diarrhea or BRBPR. Hb stable today 7.2. S/p 1U pRBC yesterday. -GI consult- planning for EGD and colonoscopy on Friday (1/23) -d/c heparin, ASA, plavix (1/20 first day without any of these medications)--will not continue plavix at discharge because per cardiology, patient is a nonresponder -repeat CBC in AM  Chest pain with hx of CAD, dCHF and current atrial tachycardia- No more chest pain. ACS ruled out twice so far this admission. Patient still with multiple episodes of atrial tachycardia overnight.  -telemetry  -cardiology consult- continue metoprolol at current dose -continue metroprolol tartrate 25mg  q8h -holding ASA, plavix, heparin in setting of GI bleed -Continue cardiac medications: Nitroglycerin, lipitor, metoprolol, IMDUR  Hyperkalemia: Resolved. K today 4.0.  Anion Gap Metabolic acidosis: AG remains elevated, 20 today. Unclear etiology. Starvation ketoacidosis vs. medication side effect vs A/CKD. Sirolimus and mycophenolate can both cause acidosis, though unsure if they specifically can cause an increase in AG. 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 Cr a few weeks ago was 1.7-1.8. FENa shows prerenal picture, perhaps 2/2 acute blood loss.  -f/u sirolimus level -restart sirolimus 3mg  daily  -renal following, appreciate recs  Metastatic Prostate cancer with back pain: Back pain is stable.   -Outpatient uro-oncology work up  HTN: stable -continue Amlodipine 10 mg daily, IMDUR 30mg  daily and metoprolol 25mg  q6h  DM-II: A1c 7.8 on admission. On Lantus 24 U daily at home. CBG control is fair: 124-242.  -lantus to 28U daily -SSI sensitive   DVT px: SCDs Diet: carb modified Dispo: patient not ready for discharge yet--defer until clinical improvement  The patient does have a current PCP (Sueanne Margarita, MD) and does not need an [facility] hospital follow-up appointment after discharge.  The patient does not have transportation limitations that hinder transportation to clinic appointments.  Services Needed at time of discharge: Y = Yes, Blank = No PT:   OT:   RN:   Equipment:   Other:     LOS: 9 days   Rebecca Eaton, MD 03/29/2013, 3:44 PM

## 2013-03-29 NOTE — Progress Notes (Signed)
[facility] Progress Note   Subjective:   Patient developed loose stools and hematochezia. Patient feeling well this am. No complaints.   Objective:   BP 144/68  Pulse 83  Temp(Src) 98.7 F (37.1 C) (Oral)  Resp 17  Ht 5\' 11"  (1.803 m)  Wt 180 lb 5.4 oz (81.8 kg)  BMI 25.16 kg/m2  SpO2 95%  Intake/Output Summary (Last 24 hours) at 03/29/13 0717 Last data filed at 03/29/13 0400  Gross per 24 hour  Intake 558.33 ml  Output    700 ml  Net -141.67 ml   Physical Exam: Gen: resting in bed. NAD. Minimal asterixis.  CVS: RRR. 2/6 Systolic murmur. Resp: Bibasilar rales noted. Abd: soft, nontender, abdomen full secondary to renal transplant. Ext: No LE edema.   Imaging: Dg Chest 2 View  03/29/2013   CLINICAL DATA:  Shortness of breath and cough.  EXAM: CHEST  2 VIEW  COMPARISON:  Chest radiograph and CT of the chest performed 03/27/2013  FINDINGS: The lungs are well-aerated. Fluffy bilateral central airspace opacification is again noted bilaterally, perhaps slightly improved from the prior study at the right lung base. Small bilateral pleural effusions are noted. The appearance on CT is suspicious for multifocal pneumonia, though superimposed interstitial edema cannot be excluded. There is no evidence of pneumothorax.  The heart is borderline enlarged. No acute osseous abnormalities are seen. Postoperative change is seen about the thyroid bed. Clips are seen at the left axilla.  IMPRESSION: 1. Persistent fluffy bilateral central airspace opacification, perhaps slightly improved at the right lung base. Small bilateral pleural effusions seen. The appearance on CT is suspicious for significant multifocal pneumonia, though superimposed interstitial edema cannot be excluded. 2. Borderline cardiomegaly.   Electronically Signed   By: Garald Balding M.D.   On: 03/29/2013 01:31   Dg Chest 2 View  03/27/2013   CLINICAL DATA:  Cough, hemoptysis  EXAM: CHEST  2 VIEW  COMPARISON:   03/23/2013  FINDINGS: Multifocal patchy airspace opacities, most prominent in the right perihilar region, mildly increased. Differential considerations include mild to moderate interstitial edema or possibly multifocal infection.  Suspected small left pleural effusion.  No pneumothorax.  Cardiomegaly.  Surgical clips overlying the bilateral neck.  IMPRESSION: Multifocal patchy airspace opacities, most prominent in the right perihilar region, mildly increased. Differential considerations include mild to moderate interstitial edema or possibly multifocal infection.  Cardiomegaly with suspected small left pleural effusion.   Electronically Signed   By: Julian Hy M.D.   On: 03/27/2013 13:00   Ct Chest Wo Contrast  03/27/2013   CLINICAL DATA:  Chest pain and shortness of breath for several months. Low-grade fever. Acute renal failure in transplant patient.  EXAM: CT CHEST WITHOUT CONTRAST  TECHNIQUE: Multidetector CT imaging of the chest was performed following the standard protocol without IV contrast.  COMPARISON:  Chest radiograph performed earlier today at 11:45 a.m.  FINDINGS: Diffuse fluffy bilateral central airspace opacity is seen, most prominent at the upper lung lobes bilaterally. This is most compatible with diffuse pneumonia. Underlying edema cannot be excluded, given underlying small bilateral pleural effusions and interstitial prominence. No pneumothorax is seen.  Scattered coronary artery calcifications are seen. The heart is mildly enlarged. A mildly prominent right paratracheal node is seen, measuring 1.3 cm in short axis. No pericardial effusion is identified. The great vessels are grossly unremarkable in appearance. The patient is status post thyroidectomy. No axillary lymphadenopathy is seen.  The visualized portions of the liver and the spleen are  unremarkable in appearance. Severe bilateral renal atrophy is seen, with scattered calcifications noted bilaterally. A small hiatal hernia is  noted.  IMPRESSION: 1. Diffuse fluffy bilateral central airspace opacification, most prominent at the upper lung lobes. This is most compatible with diffuse multifocal pneumonia. Underlying edema cannot be excluded, given small bilateral pleural effusions and interstitial prominence. 2. Scattered coronary calcifications seen. 3. Mild cardiomegaly. 4. Mildly prominent right paratracheal node, measuring 1.3 cm in short axis. 5. Severe bilateral renal atrophy, with scattered associated calcifications. 6. Small hiatal hernia seen.   Electronically Signed   By: Garald Balding M.D.   On: 03/27/2013 21:48    Labs: BMET  Recent Labs Lab 03/22/13 1357  03/27/13 0430 03/27/13 1300 03/27/13 1725 03/27/13 2025 03/28/13 0620 03/28/13 1040 03/29/13 0352  NA 134*  < > 134* 133* 135* 135* 130* 131* 132*  K 4.5  < > 5.6* 6.1* 5.4* 5.2 5.3 4.8 4.0  CL 97  < > 94* 92* 93* 92* 88* 88* 90*  CO2 16*  < > 19 20 22 21 19 20 22   GLUCOSE 155*  < > 261* 313* 279* 274* 365* 255* 204*  BUN 28*  < > 44* 50* 52* 52* 50* 49* 46*  CREATININE 1.96*  < > 2.42* 2.60* 2.71* 2.67* 2.50* 2.53* 2.31*  CALCIUM 8.7  < > 7.7* 7.7* 8.2* 8.5 7.7* 7.9* 7.5*  PHOS 3.2  --   --   --   --   --   --   --   --   < > = values in this interval not displayed. CBC  Recent Labs Lab 03/26/13 1120 03/27/13 0430 03/28/13 0620 03/28/13 0815 03/28/13 1745 03/29/13 0352  WBC 8.3 9.1 11.0*  --   --  10.2  NEUTROABS 6.0  --  8.7*  --   --  7.7  HGB 7.7* 7.7* 5.7* 6.5* 8.1* 7.2*  HCT 24.8* 25.0* 18.1* 20.7* 25.9* 22.5*  MCV 74.7* 74.4* 73.0*  --   --  73.5*  PLT 402* 395 363  --   --  318    Medications:     . aspirin  81 mg Oral Daily  . atorvastatin  80 mg Oral q1800  . ceFEPime (MAXIPIME) IV  2 g Intravenous Q24H  . feeding supplement (ENSURE COMPLETE)  237 mL Oral TID BM  . insulin aspart  0-9 Units Subcutaneous TID WC  . insulin glargine  28 Units Subcutaneous Daily  . isosorbide mononitrate  30 mg Oral Daily  .  levothyroxine  150 mcg Oral Daily  . methocarbamol  500 mg Oral BID  . metoprolol tartrate  25 mg Oral Q6H  . mycophenolate  720 mg Oral BID  . pantoprazole  40 mg Oral Daily  . regadenoson  0.4 mg Intravenous Once  . sirolimus  3 mg Oral Daily  . sodium bicarbonate  1,300 mg Oral BID  . vancomycin  1,000 mg Intravenous Q24H    Assessment/ Plan:   Patient is a 63 y.o. male with a PMHx of HTN, DM-2, CAD s/p stenting of LAD, hx of papillary thyroid cancer, CKD III s/p Renal transplant, PVD, Carotid artery stenosis s/p right carotid endarterectomy, metastatic prostate cancerwho was admitted to [facility] 03[facility]2013 for evaluation of chest pain.  1) Chest pain, Hx of CAD  2) Hx of Diastolic CHF  3) Acute on chronic kidney disease s/p history of kidney transplant, baseline creatinine 1.6-1.8 Cr stable.  Vol xs mild.  FENA low c/w  drug toxicity with MTor inhibitor, low Hb, or CHF.. Ongoing concern of viral infx 4) Anion Gap Metabolic acidosis  5) Fever of Unknown origin  6) Metastatic prostate cancer  7) HTN  8) DM-2  9) Atrial Tachycardia - Patient was tachycardic overnight and required Lopressor and was transferred [facility].  Tachycardia now resolved. 10) Anemia  11) Hyperkalemia   Plan:  Acute on chronic kidney disease s/p history of kidney transplant, baseline creatinine 1.6-1.8  - Patient was started on Diltiazem on 1/14. This is a known inhibitor of Sirolimus metabolism. This has likely led to toxicity and contributed to renal failure.  - Urine sodium <20; FENa <0.18 (prerenal) indicating low perfusion to the kidneys.  Likely secondary to toxicity and significant anemia.  - Still awaiting sirolimus level.  Patient now back on Sirolimus 3 mg daily. - Creatinine improving slowly. 2.3 this am. - Will continue to follow closely; anticipate continued improvement.  Fever of Unknown origin and hemoptysis - Sirolimus toxicity could be contributing (due to increased immunosuppression).  - BK  virus, EBV PCRs negative. Awaiting CMV PCR  - Chest xray today revealed ? Multifocal PNA vs Moderate Interstitial edema.  ID is following patient.  Additional workup underway. - Pulm now following as well.  No indication for bronch currently; will monitor for improvement.  - Further workup/management per ID/Primary team  Anemia - Iron 17, % Saturation of 10; IV Feraheme and Aranesp given 1/19 - Anemia worsened this yesterday.  Patient transfused 2 units with improvement in Hb to 8.1. - Developed BRBPR.  GI now following.  In need of EGD and Colonoscopy (likey Friday as patient was on plavix)  Hyperkalemia & Metabolic Acidosis, AG  - Hyperkalemia resolved.  - Acidosis improving. Will continue TID Bicarb. - Will continue to monitor closely.   Coral Spikes, DO PGY-2, [facility] 03/29/2013, 7:17 AM I have seen and examined this patient and agree with the plan of care seen, eval,discussed and examined.  Cont to improve. Suspect mTor toxic .  Orena Cavazos L 03/29/2013, 4:28 PM

## 2013-03-29 NOTE — Consult Note (Signed)
[facility] PCCM    Name: Joshua [name] MRN: [id number] DOB: [birth year]    ADMISSION DATE:  03/20/2013 CONSULTATION DATE: 1-20  REFERRING MD :  FPTS  CHIEF COMPLAINT:  Abnormal xray  BRIEF PATIENT DESCRIPTION:  63 yo male never smoker admitted 1/12 with chest pain.  Later developed cough, hemoptysis, and b/l pulmonary infiltrates.  Hx of CAD on plavix, and ESRD s/p renal transplant on chronic immunosuppression.  PCCM consulted to assist with evaluation of hemoptysis and pulmonary infiltrates.  SIGNIFICANT EVENTS: 1/12 Admit with chest pain, cardiology consulted 1/13 Fever 101.7 1/19 Chills/hemoptysis, renal consult, ID consult  1/20 PCCM consulted, plavix held, GI consult for rectal bleed 1/21 Atrial tachycardia >> transfer to SDU  STUDIES:  1/12 V/Q scan >> very low probability for PE 1/12 CXR >> no acute process 1/13 Echo >> EF 45 to 73%, diastolic dysfx, mild/mod AS, mild MR, mild LA dilation, PAS 43 mmHg 1/14 Renal u/s >> normal appearance transplanted kidney 1/14 Nuclear stress test >> Evidence of severe ischemia at rest and/or scar in the Rt coronary artery distribution. This appears to be at least partly reversible. 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  Denies chest pain, cough, SOB.  AFib RVR overnight, now in SDU.   VITAL SIGNS: Temp:  [98 F (36.7 C)-100 F (37.8 C)] 98.9 F (37.2 C) (01/21 0743) Pulse Rate:  [82-135] 85 (01/21 0743) Resp:  [16-22] 18 (01/21 0743) BP:  (125-170)/(50-88) 143/63 mmHg (01/21 0743) SpO2:  [88 %-96 %] 92 % (01/21 0743) Weight:  [180 lb 1.9 oz (81.7 kg)-180 lb 5.4 oz (81.8 kg)] 180 lb 5.4 oz (81.8 kg) (01/21 0400) 2 liters [place]  INTAKE / OUTPUT: Intake/Output     01/20 0701 - 01/21 0700 01/21 0701 - 01/22 0700   P.O.     I.V. (mL/kg) 250 (3.1)    Blood 308.3    IV Piggyback  100   Total Intake(mL/kg) 558.3 (6.8) 100 (1.2)   Urine (mL/kg/hr) 900 (0.5)    Total Output 900     Net -341.7 +100          PHYSICAL EXAMINATION: General:  WNWDAAM, NAD at rest Neuro:  Intact HEENT: No lan/JVD poor dentation Cardiovascular:  s1s2 rrr, 80's Lungs:  resps even non labored, exp wheezes and few scattered rhonchi bilat Abdomen:  Rt kidney transplant noted, +bs Musculoskeletal:  Intact Skin:  Warm, multiple av fistulas with left bicep +bruit thrill  LABS:  CBC  Recent Labs Lab 03/27/13 0430 03/28/13 0620 03/28/13 0815 03/28/13 1745 03/29/13 0352  WBC 9.1 11.0*  --   --  10.2  HGB 7.7* 5.7* 6.5* 8.1* 7.2*  HCT 25.0* 18.1* 20.7* 25.9* 22.5*  PLT 395 363  --   --  318   BMET  Recent Labs Lab 03/28/13 0620 03/28/13 1040 03/29/13 0352  NA 130* 131* 132*  K 5.3 4.8 4.0  CL 88* 88* 90*  CO2 19 20 22   BUN 50* 49*  46*  CREATININE 2.50* 2.53* 2.31*  GLUCOSE 365* 255* 204*   Electrolytes  Recent Labs Lab 03/22/13 1357  03/28/13 0620 03/28/13 1040 03/29/13 0352  CALCIUM 8.7  < > 7.7* 7.9* 7.5*  PHOS 3.2  --   --   --   --   < > = values in this interval not displayed.  Sepsis Markers  Recent Labs Lab 03/24/13 1110  LATICACIDVEN 1.1   ABG  Recent Labs Lab 03/22/13 1730  PHART 7.404  PCO2ART 31.3*  PO2ART 76.3*   Liver Enzymes  Recent Labs Lab 03/22/13 1357 03/27/13 1300  AST  --  45*  ALT  --  26  ALKPHOS  --  385*  BILITOT  --  0.3  ALBUMIN 2.2* 2.1*   Cardiac Enzymes  Recent Labs Lab 03/27/13 0930 03/27/13 1615 03/27/13 2310  TROPONINI <0.30 <0.30 <0.30  PROBNP  --  19450.0*   --    Glucose  Recent Labs Lab 03/27/13 2040 03/28/13 0756 03/28/13 1158 03/28/13 1626 03/28/13 2122 03/29/13 0758  GLUCAP 277* 308* 211* 242* 124* 200*    Imaging Dg Chest 2 View  03/29/2013   CLINICAL DATA:  Shortness of breath and cough.  EXAM: CHEST  2 VIEW  COMPARISON:  Chest radiograph and CT of the chest performed 03/27/2013  FINDINGS: The lungs are well-aerated. Fluffy bilateral central airspace opacification is again noted bilaterally, perhaps slightly improved from the prior study at the right lung base. Small bilateral pleural effusions are noted. The appearance on CT is suspicious for multifocal pneumonia, though superimposed interstitial edema cannot be excluded. There is no evidence of pneumothorax.  The heart is borderline enlarged. No acute osseous abnormalities are seen. Postoperative change is seen about the thyroid bed. Clips are seen at the left axilla.  IMPRESSION: 1. Persistent fluffy bilateral central airspace opacification, perhaps slightly improved at the right lung base. Small bilateral pleural effusions seen. The appearance on CT is suspicious for significant multifocal pneumonia, though superimposed interstitial edema cannot be excluded. 2. Borderline cardiomegaly.   Electronically Signed   By: Garald Balding M.D.   On: 03/29/2013 01:31   Dg Chest 2 View  03/27/2013   CLINICAL DATA:  Cough, hemoptysis  EXAM: CHEST  2 VIEW  COMPARISON:  03/23/2013  FINDINGS: Multifocal patchy airspace opacities, most prominent in the right perihilar region, mildly increased. Differential considerations include mild to moderate interstitial edema or possibly multifocal infection.  Suspected small left pleural effusion.  No pneumothorax.  Cardiomegaly.  Surgical clips overlying the bilateral neck.  IMPRESSION: Multifocal patchy airspace opacities, most prominent in the right perihilar region, mildly increased. Differential considerations include mild to moderate interstitial edema or  possibly multifocal infection.  Cardiomegaly with suspected small left pleural effusion.   Electronically Signed   By: Julian Hy M.D.   On: 03/27/2013 13:00   Ct Chest Wo Contrast  03/27/2013   CLINICAL DATA:  Chest pain and shortness of breath for several months. Low-grade fever. Acute renal failure in transplant patient.  EXAM: CT CHEST WITHOUT CONTRAST  TECHNIQUE: Multidetector CT imaging of the chest was performed following the standard protocol without IV contrast.  COMPARISON:  Chest radiograph performed earlier today at 11:45 a.m.  FINDINGS: Diffuse fluffy bilateral central airspace opacity is seen, most prominent at the upper lung lobes bilaterally. This is most compatible with diffuse pneumonia. Underlying edema cannot be excluded, given underlying small bilateral pleural effusions and interstitial prominence. No pneumothorax is seen.  Scattered coronary artery calcifications are  seen. The heart is mildly enlarged. A mildly prominent right paratracheal node is seen, measuring 1.3 cm in short axis. No pericardial effusion is identified. The great vessels are grossly unremarkable in appearance. The patient is status post thyroidectomy. No axillary lymphadenopathy is seen.  The visualized portions of the liver and the spleen are unremarkable in appearance. Severe bilateral renal atrophy is seen, with scattered calcifications noted bilaterally. A small hiatal hernia is noted.  IMPRESSION: 1. Diffuse fluffy bilateral central airspace opacification, most prominent at the upper lung lobes. This is most compatible with diffuse multifocal pneumonia. Underlying edema cannot be excluded, given small bilateral pleural effusions and interstitial prominence. 2. Scattered coronary calcifications seen. 3. Mild cardiomegaly. 4. Mildly prominent right paratracheal node, measuring 1.3 cm in short axis. 5. Severe bilateral renal atrophy, with scattered associated calcifications. 6. Small hiatal hernia seen.    Electronically Signed   By: Garald Balding M.D.   On: 03/27/2013 21:48    ASSESSMENT:  63 yo male with intermittent fever, b/l upper lobe predominant pulmonary infiltrates since 1/15, and hemoptysis.  He has hx of renal transplant on chronic immunosuppression, CAD on plavix, and metastatic prostate cancer.  Concern is for infection versus alveolar hemorrhage.  PLAN: -continue Abx per ID -discontinued plavix -f/u culture results -defer bronchoscopy for now >> concerned about potential cardiac risk for bronchoscopy given findings on Echo and nuclear stress test, and atrial tachycardia -sirolimus, mycophenolate per renal   WHITEHEART,KATHRYN, NP 03/29/2013  10:07 AM Pager: [phone] or [phone]  Reviewed above, examined pt, and agree with assessment/plan.  Chesley Mires, MD [facility] Pulmonary/Critical Care 03/29/2013, 11:08 AM Pager:  [phone] After 3pm call: [phone]

## 2013-03-30 DIAGNOSIS — I5030 Unspecified diastolic (congestive) heart failure: Secondary | ICD-10-CM

## 2013-03-30 DIAGNOSIS — K922 Gastrointestinal hemorrhage, unspecified: Secondary | ICD-10-CM

## 2013-03-30 DIAGNOSIS — C801 Malignant (primary) neoplasm, unspecified: Secondary | ICD-10-CM

## 2013-03-30 DIAGNOSIS — I2510 Atherosclerotic heart disease of native coronary artery without angina pectoris: Secondary | ICD-10-CM

## 2013-03-30 LAB — BASIC METABOLIC PANEL
BUN: 41 mg/dL — AB (ref 6–23)
CALCIUM: 7.2 mg/dL — AB (ref 8.4–10.5)
CHLORIDE: 93 meq/L — AB (ref 96–112)
CO2: 25 mEq/L (ref 19–32)
CREATININE: 2.22 mg/dL — AB (ref 0.50–1.35)
GFR calc non Af Amer: 30 mL/min — ABNORMAL LOW (ref >90)
GFR, EST AFRICAN AMERICAN: 35 mL/min — AB (ref >90)
Glucose, Bld: 102 mg/dL — ABNORMAL HIGH (ref 70–99)
Potassium: 4.3 mEq/L (ref 3.7–5.3)
Sodium: 135 mEq/L — ABNORMAL LOW (ref 137–147)

## 2013-03-30 LAB — CBC
HEMATOCRIT: 23.3 % — AB (ref 39.0–52.0)
Hemoglobin: 7.1 g/dL — ABNORMAL LOW (ref 13.0–17.0)
MCH: 23.1 pg — ABNORMAL LOW (ref 26.0–34.0)
MCHC: 30.5 g/dL (ref 30.0–36.0)
MCV: 75.9 fL — AB (ref 78.0–100.0)
PLATELETS: 339 10*3/uL (ref 150–400)
RBC: 3.07 MIL/uL — ABNORMAL LOW (ref 4.22–5.81)
RDW: 18.4 % — ABNORMAL HIGH (ref 11.5–15.5)
WBC: 9.6 10*3/uL (ref 4.0–10.5)

## 2013-03-30 LAB — GLUCOSE, CAPILLARY
GLUCOSE-CAPILLARY: 109 mg/dL — AB (ref 70–99)
GLUCOSE-CAPILLARY: 184 mg/dL — AB (ref 70–99)
GLUCOSE-CAPILLARY: 180 mg/dL — AB (ref 70–99)
Glucose-Capillary: 115 mg/dL — ABNORMAL HIGH (ref 70–99)

## 2013-03-30 LAB — CULTURE, RESPIRATORY: Culture: NORMAL

## 2013-03-30 LAB — CULTURE, RESPIRATORY W GRAM STAIN

## 2013-03-30 LAB — SIROLIMUS LEVEL: SIROLIMUS (RAPAMYCIN): 23.9 ug/L — AB (ref 3.0–18.0)

## 2013-03-30 MED ORDER — PEG-KCL-NACL-NASULF-NA ASC-C 100 G PO SOLR
0.5000 | Freq: Once | ORAL | Status: AC
  Administered 2013-03-30: 100 g via ORAL
  Filled 2013-03-30: qty 1

## 2013-03-30 MED ORDER — SIROLIMUS 1 MG PO TABS
2.0000 mg | ORAL_TABLET | Freq: Every day | ORAL | Status: DC
  Administered 2013-03-31 – 2013-04-02 (×3): 2 mg via ORAL
  Filled 2013-03-30 (×5): qty 2

## 2013-03-30 MED ORDER — DILTIAZEM HCL ER COATED BEADS 120 MG PO CP24
120.0000 mg | ORAL_CAPSULE | Freq: Every day | ORAL | Status: DC
  Administered 2013-03-30 – 2013-04-05 (×7): 120 mg via ORAL
  Filled 2013-03-30 (×8): qty 1

## 2013-03-30 MED ORDER — OXYCODONE HCL 5 MG PO TABS
10.0000 mg | ORAL_TABLET | ORAL | Status: DC | PRN
  Administered 2013-03-30 – 2013-04-05 (×23): 10 mg via ORAL
  Filled 2013-03-30 (×24): qty 2

## 2013-03-30 MED ORDER — PEG-KCL-NACL-NASULF-NA ASC-C 100 G PO SOLR
0.5000 | Freq: Once | ORAL | Status: AC
  Administered 2013-03-31: 100 g via ORAL
  Filled 2013-03-30 (×3): qty 1

## 2013-03-30 MED ORDER — SODIUM BICARBONATE 650 MG PO TABS
650.0000 mg | ORAL_TABLET | Freq: Two times a day (BID) | ORAL | Status: DC
  Administered 2013-03-30 – 2013-04-05 (×13): 650 mg via ORAL
  Filled 2013-03-30 (×14): qty 1

## 2013-03-30 MED ORDER — LEVOFLOXACIN 750 MG PO TABS
750.0000 mg | ORAL_TABLET | ORAL | Status: DC
  Administered 2013-03-31 – 2013-04-04 (×3): 750 mg via ORAL
  Filled 2013-03-30 (×4): qty 1

## 2013-03-30 MED ORDER — VANCOMYCIN HCL 10 G IV SOLR: 1250.0000 mg | INTRAVENOUS | Status: DC

## 2013-03-30 MED ORDER — LEVOFLOXACIN 750 MG PO TABS: 750.0000 mg | ORAL_TABLET | Freq: Every day | ORAL | Status: DC

## 2013-03-30 MED ORDER — PEG-KCL-NACL-NASULF-NA ASC-C 100 G PO SOLR: 1.0000 | Freq: Once | ORAL | Status: DC

## 2013-03-30 NOTE — Progress Notes (Signed)
Report called. Transferring to 3E15 via w/c with belongings.

## 2013-03-30 NOTE — Progress Notes (Addendum)
Subjective: Joshua [name] reports improvement of his cough. No more hemoptysis. Diarrhea and rectal bleeding has resolved. Patient does report having intermittent RLQ pain since yesterday that resolves with oxycodone. Back pain is well controlled with oxycodone. On telemetry, patient still having multiple episodes of atrial tachycardia. He is satting 88% on room air, so is still on supplemental oxygen.  Objective: Vital signs in last 24 hours: Filed Vitals:   03/30/13 0500 03/30/13 0530 03/30/13 0719 03/30/13 1152  BP:  133/64 131/62 139/57  Pulse: 80 83 74 163  Temp:   98.9 F (37.2 C) 98.5 F (36.9 C)  TempSrc:   Oral Oral  Resp: $Remo'13  14 20  'blzcY$ Height:      Weight: 179 lb 14.3 oz (81.6 kg)     SpO2: 95%  96% 94%  on 3LPM O2  Weight change: -3.5 oz (-0.1 kg)  Intake/Output Summary (Last 24 hours) at 03/30/13 1403 Last data filed at 03/30/13 1200  Gross per 24 hour  Intake    730 ml  Output    850 ml  Net   -120 ml   Physical Exam General: lying comfortably in bed HEENT: [place]/AT, vision grossly intact Cardiac: RRR, harsh systolic ejection murmur loudest over R sternal border Pulm: mild bibasilar crackles, no wheezing, no increased WOB Abd: soft, nontender, nondistended, BS present Ext: warm and well perfused, no pedal edema Neuro: alert and oriented X3, cranial nerves II-XII grossly intact, moves all extremities spontaneously  Lab Results: Basic Metabolic Panel:  Recent Labs Lab 03/29/13 0352 03/30/13 0316  NA 132* 135*  K 4.0 4.3  CL 90* 93*  CO2 22 25  GLUCOSE 204* 102*  BUN 46* 41*  CREATININE 2.31* 2.22*  CALCIUM 7.5* 7.2*   Liver Function Tests:  Recent Labs Lab 03/27/13 1300  AST 45*  ALT 26  ALKPHOS 385*  BILITOT 0.3  PROT 7.5  ALBUMIN 2.1*   CBC:  Recent Labs Lab 03/28/13 0620  03/29/13 0352 03/30/13 0316  WBC 11.0*  --  10.2 9.6  NEUTROABS 8.7*  --  7.7  --   HGB 5.7*  < > 7.2* 7.1*  HCT 18.1*  < > 22.5* 23.3*  MCV 73.0*  --  73.5*  75.9*  PLT 363  --  318 339  < > = values in this interval not displayed. Cardiac Enzymes:  Recent Labs Lab 03/27/13 0930 03/27/13 1615 03/27/13 2310  CKTOTAL 99  --   --   CKMB 1.2  --   --   TROPONINI <0.30 <0.30 <0.30   CBG:  Recent Labs Lab 03/29/13 0758 03/29/13 1145 03/29/13 1522 03/29/13 2148 03/30/13 0728 03/30/13 1153  GLUCAP 200* 161* 142* 177* 109* 184*   Iron/TIBC/Ferritin    Component Value Date/Time   IRON 17* 03/27/2013 0930   TIBC 166* 03/27/2013 0930   FERRITIN 5064* 03/27/2013 0930   BNP    Component Value Date/Time   PROBNP 19450.0* 03/27/2013 1615   Urinalysis    Component Value Date/Time   COLORURINE YELLOW 03/27/2013 1834   APPEARANCEUR CLOUDY* 03/27/2013 1834   LABSPEC 1.027 03/27/2013 1834   PHURINE 5.0 03/27/2013 1834   GLUCOSEU NEGATIVE 03/27/2013 1834   HGBUR SMALL* 03/27/2013 1834   BILIRUBINUR SMALL* 03/27/2013 1834   KETONESUR NEGATIVE 03/27/2013 1834   PROTEINUR 100* 03/27/2013 1834   UROBILINOGEN 1.0 03/27/2013 1834   NITRITE NEGATIVE 03/27/2013 1834   LEUKOCYTESUR NEGATIVE 03/27/2013 1834   Micro Results: Recent Results (from the past 240 hour(s))  CULTURE, BLOOD (ROUTINE X 2)     Status: None   Collection Time    03/21/13  9:40 AM      Result Value Range Status   Specimen Description BLOOD RIGHT HAND   Final   Special Requests BOTTLES DRAWN AEROBIC AND ANAEROBIC 10CC   Final   Culture  Setup Time     Final   Value: 03/21/2013 16:16     Performed at [facility]   Culture     Final   Value: NO GROWTH 5 DAYS     Performed at [facility]   Report Status 03/27/2013 FINAL   Final  CULTURE, BLOOD (ROUTINE X 2)     Status: None   Collection Time    03/21/13  9:50 AM      Result Value Range Status   Specimen Description BLOOD RIGHT HAND   Final   Special Requests BOTTLES DRAWN AEROBIC ONLY 5CC   Final   Culture  Setup Time     Final   Value: 03/21/2013 16:16     Performed at [facility]   Culture      Final   Value: NO GROWTH 5 DAYS     Performed at [facility]   Report Status 03/27/2013 FINAL   Final  CULTURE, BLOOD (ROUTINE X 2)     Status: None   Collection Time    03/27/13  5:25 PM      Result Value Range Status   Specimen Description BLOOD RIGHT HAND   Final   Special Requests BOTTLES DRAWN AEROBIC ONLY 10CC   Final   Culture  Setup Time     Final   Value: 03/27/2013 22:06     Performed at [facility]   Culture     Final   Value:        BLOOD CULTURE RECEIVED NO GROWTH TO DATE CULTURE WILL BE HELD FOR 5 DAYS BEFORE ISSUING A FINAL NEGATIVE REPORT     Performed at [facility]   Report Status PENDING   Incomplete  CULTURE, BLOOD (ROUTINE X 2)     Status: None   Collection Time    03/27/13  5:28 PM      Result Value Range Status   Specimen Description BLOOD RIGHT HAND   Final   Special Requests BOTTLES DRAWN AEROBIC ONLY 10CC   Final   Culture  Setup Time     Final   Value: 03/27/2013 22:05     Performed at [facility]   Culture     Final   Value:        BLOOD CULTURE RECEIVED NO GROWTH TO DATE CULTURE WILL BE HELD FOR 5 DAYS BEFORE ISSUING A FINAL NEGATIVE REPORT     Performed at [facility]   Report Status PENDING   Incomplete  RESPIRATORY VIRUS PANEL     Status: None   Collection Time    03/27/13  6:14 PM      Result Value Range Status   Source - RVPAN NASAL SWAB   Corrected   Comment: CORRECTED ON 01/20 AT 1804: PREVIOUSLY REPORTED AS NASAL SWAB   Respiratory Syncytial Virus A NOT DETECTED   Final   Respiratory Syncytial Virus B NOT DETECTED   Final   Influenza A NOT DETECTED   Final   Influenza B NOT DETECTED   Final   Parainfluenza 1 NOT DETECTED   Final   Parainfluenza 2 NOT  DETECTED   Final   Parainfluenza 3 NOT DETECTED   Final   Metapneumovirus NOT DETECTED   Final   Rhinovirus NOT DETECTED   Final   Adenovirus NOT DETECTED   Final   Influenza A H1 NOT DETECTED   Final   Influenza A H3 NOT DETECTED   Final    Comment: (NOTE)           Normal Reference Range for each Analyte: NOT DETECTED     Testing performed using the Luminex xTAG Respiratory Viral Panel test     kit.     This test was developed and its performance characteristics determined     by [facility]. It has not been cleared or approved by the [place]     Food and Drug Administration. This test is used for clinical purposes.     It should not be regarded as investigational or for research. This     laboratory is certified under the [place] (CLIA) as qualified to perform high complexity     clinical laboratory testing.     Performed at [place], EXPECTORATED SPUTUM-ASSESSMENT     Status: None   Collection Time    03/27/13  6:34 PM      Result Value Range Status   Specimen Description SPUTUM   Final   Special Requests NONE   Final   Sputum evaluation     Final   Value: THIS SPECIMEN IS ACCEPTABLE. RESPIRATORY CULTURE REPORT TO FOLLOW.   Report Status 03/27/2013 FINAL   Final  URINE CULTURE     Status: None   Collection Time    03/27/13  6:34 PM      Result Value Range Status   Specimen Description URINE, CLEAN CATCH   Final   Special Requests NONE   Final   Culture  Setup Time     Final   Value: 03/28/2013 01:05     Performed at [place]     Final   Value: NO GROWTH     Performed at [facility]   Culture     Final   Value: NO GROWTH     Performed at [facility]   Report Status 03/28/2013 FINAL   Final  CULTURE, RESPIRATORY (NON-EXPECTORATED)     Status: None   Collection Time    03/27/13  6:34 PM      Result Value Range Status   Specimen Description SPUTUM   Final   Special Requests NONE   Final   Gram Stain     Final   Value: RARE WBC PRESENT,BOTH PMN AND MONONUCLEAR     NO SQUAMOUS EPITHELIAL CELLS SEEN     RARE GRAM POSITIVE COCCI IN PAIRS     RARE GRAM POSITIVE RODS     Performed at [facility]   Culture     Final   Value: NORMAL OROPHARYNGEAL FLORA     Performed at [facility]   Report Status 03/30/2013 FINAL   Final  MRSA PCR SCREENING     Status: None   Collection Time    03/28/13 10:42 PM      Result Value Range Status   MRSA by PCR NEGATIVE  NEGATIVE Final   Comment:            The GeneXpert MRSA Assay (FDA     approved for  NASAL specimens     only), is one component of a     comprehensive MRSA colonization     surveillance program. It is not     intended to diagnose MRSA     infection nor to guide or     monitor treatment for     MRSA infections.    MISC: sirolimus level 23.9 (high) quantiferon negative PTH 78.5 (H) CMV pcr negative EBV pcr negative BK virus pcr negative Respiratory virus panel negative Pneumocystis smear pending Aspergillus antigen pending Sputum culture- normal oropharyngeal flora; fungal and AFB culture pending UCX NGTD BCX NGTD x 2 FOBT negative, repeat pending FENa- .2%  Dg Chest 2 View  03/27/2013   CLINICAL DATA:  Cough, hemoptysis  EXAM: CHEST  2 VIEW  COMPARISON:  03/23/2013  FINDINGS: Multifocal patchy airspace opacities, most prominent in the right perihilar region, mildly increased. Differential considerations include mild to moderate interstitial edema or possibly multifocal infection.  Suspected small left pleural effusion.  No pneumothorax.  Cardiomegaly.  Surgical clips overlying the bilateral neck.  IMPRESSION: Multifocal patchy airspace opacities, most prominent in the right perihilar region, mildly increased. Differential considerations include mild to moderate interstitial edema or possibly multifocal infection.  Cardiomegaly with suspected small left pleural effusion.   Electronically Signed   By: Julian Hy M.D.   On: 03/27/2013 13:00   Dg Chest 2 View  03/23/2013   CLINICAL DATA:  Chest pain with history of congestive heart failure  EXAM: CHEST  2 VIEW  COMPARISON:  03/20/2013  FINDINGS: Heart  size upper normal. Mild vascular congestion has developed when compared to the prior study. There is bilateral perihilar peribronchial cuffing with indistinct bilateral perihilar vessels and mild hazy opacity in the perihilar regions bilaterally. There are no pleural effusions. Surgical clips in the thyroid bed again identified.  IMPRESSION: Findings suggest development of congestive heart failure with mild pulmonary edema.   Electronically Signed   By: Skipper Cliche M.D.   On: 03/23/2013 16:22   Dg Chest 2 View  03/20/2013   CLINICAL DATA:  Chest and back pain.  Hypertension.  EXAM: CHEST  2 VIEW  COMPARISON:  08/19/2011  FINDINGS: The lungs are clear without focal infiltrate, edema, pneumothorax or pleural effusion. Cardiopericardial silhouette is at upper limits of normal for size. Imaged bony structures of the thorax are intact. Surgical clips are seen in the thyroid bed and left axilla.  IMPRESSION: Stable.  No acute cardiopulmonary process.   Electronically Signed   By: Misty Stanley M.D.   On: 03/20/2013 07:45   Ct Chest Wo Contrast  03/27/2013   CLINICAL DATA:  Chest pain and shortness of breath for several months. Low-grade fever. Acute renal failure in transplant patient.  EXAM: CT CHEST WITHOUT CONTRAST  TECHNIQUE: Multidetector CT imaging of the chest was performed following the standard protocol without IV contrast.  COMPARISON:  Chest radiograph performed earlier today at 11:45 a.m.  FINDINGS: Diffuse fluffy bilateral central airspace opacity is seen, most prominent at the upper lung lobes bilaterally. This is most compatible with diffuse pneumonia. Underlying edema cannot be excluded, given underlying small bilateral pleural effusions and interstitial prominence. No pneumothorax is seen.  Scattered coronary artery calcifications are seen. The heart is mildly enlarged. A mildly prominent right paratracheal node is seen, measuring 1.3 cm in short axis. No pericardial effusion is identified. The  great vessels are grossly unremarkable in appearance. The patient is status post thyroidectomy. No axillary lymphadenopathy is seen.  The  visualized portions of the liver and the spleen are unremarkable in appearance. Severe bilateral renal atrophy is seen, with scattered calcifications noted bilaterally. A small hiatal hernia is noted.  IMPRESSION: 1. Diffuse fluffy bilateral central airspace opacification, most prominent at the upper lung lobes. This is most compatible with diffuse multifocal pneumonia. Underlying edema cannot be excluded, given small bilateral pleural effusions and interstitial prominence. 2. Scattered coronary calcifications seen. 3. Mild cardiomegaly. 4. Mildly prominent right paratracheal node, measuring 1.3 cm in short axis. 5. Severe bilateral renal atrophy, with scattered associated calcifications. 6. Small hiatal hernia seen.   Electronically Signed   By: Garald Balding M.D.   On: 03/27/2013 21:48   Nm Bone Scan Whole Body  03/03/2013   CLINICAL DATA:  Pain.  Prostate cancer.  EXAM: NUCLEAR MEDICINE WHOLE BODY BONE SCAN  TECHNIQUE: Whole body anterior and posterior images were obtained approximately 3 hours after intravenous injection of radiopharmaceutical.  COMPARISON:  Bone scan 10/26/2007.  CT 02/28/2013.  RADIOPHARMACEUTICALS:  26.0 mCi Technetium-99 MDP  FINDINGS: Bilateral renal atrophy with a transplanted kidney right pelvis. No focal bony abnormality. No evidence of bony metastatic disease.  IMPRESSION: No evidence of bony metastatic disease or focal bony abnormality. Bilateral renal atrophy. Transplanted kidney right pelvis.   Electronically Signed   By: Marcello Moores  Register   On: 03/03/2013 14:02   US Renal  03/22/2013   CLINICAL DATA:  Acute kidney injury. Patient with bilateral renal transplant.  EXAM: RENAL/URINARY TRACT ULTRASOUND COMPLETE  COMPARISON:  CT, 02/28/2013  FINDINGS: Right Kidney:  Length: 7.4 cm. Kidney is echogenic. 15 mm cyst arises from the upper pole.  There are probable additional tiny cysts. No hydronephrosis.  Left Kidney:  Native left kidney not visualized.  Transplant kidney: In the right lower quadrant to right upper pelvis, a transplant kidney is visualized. Is normal in overall size measuring 10.7 cm. It has normal parenchymal echogenicity. Small cyst arises from its lower pole measuring 1 cm. No other renal masses. No hydronephrosis.  Bladder:  Appears normal for degree of bladder distention.  IMPRESSION: 1. Transplant kidney is normal in appearance within a small lower pole cyst measuring 1 cm. 2. Atrophic native kidneys. Only the right was visualized. Is echogenic with at least 1 discrete small cysts. No hydronephrosis.   Electronically Signed   By: Lajean Manes M.D.   On: 03/22/2013 15:50   Nm Myocar Multi W/spect W/wall Motion / Ef  03/22/2013   CLINICAL DATA:  63yo AA man with PMH of HTN, DM2, CAD with stents, CAD, PVD s/p fem pop, prostate cancer treated with radiation, hx of thyroid cancer, h/o ESRD and kidney transplantation who presents for chest pain and back pain  EXAM: MYOCARDIAL IMAGING WITH SPECT (REST)  TECHNIQUE: Standard myocardial SPECT imaging was performed after resting intravenous injection of Tc-62m sestamibi. Quantitative gated imaging was also performed to evaluate left ventricular wall motion, and estimate left ventricular ejection fraction. As the patient's heart rate was 129 bpm when he presented for his "stress" images, no additional administration of Lexiscan was performed.  COMPARISON:  None.  FINDINGS: The Image quality is good. There is mild interference from visceral racer uptake. The left ventricle is moderately to severely dilated. There is a moderate in size, moderate-to-severe in intensity inferior and inferolateral perfusion abnormality seen on both rest and "stress" images, possibly worse on the "stress" images. There is global hypokinesis of the left ventricle, with severe basal and mid inferior wall  hypokinesis. EF 34%. The severity  of LV dysfunction is out of proportion to the perfusion abnormality.  IMPRESSION: Interpretation of the study is severely limited by absence of a clear increase in coronary flow during acquisition of the "stress" images  There is evidence of severe ischemia at rest and/or scar in the right coronary artery distribution. This appears to be at least partly reversible.  The degree of LV dysfunction suggests multivessel CAD versus a component of superimposed nonischemic cardiomyopathy.  No previous studies are available for comparison   Electronically Signed   By: Sanda Klein   On: 03/22/2013 19:29   Nm Pulmonary Perf And Vent  03/20/2013   CLINICAL DATA:  Chest pain  EXAM: NUCLEAR MEDICINE VENTILATION - PERFUSION LUNG SCAN  Views: Anterior, posterior, left lateral, right lateral, RPO, LPO, RAO, LAO -ventilation and perfusion  Radionuclide: Technetium 36m DTPA -ventilation; Technetium 16m macroaggregated albumin-perfusion  Dose:  40.0 mCi-ventilation; 6.0 mCi-perfusion  Route of administration: Inhalation -ventilation; intravenous -perfusion  COMPARISON:  Chest radiograph March 20, 2013  FINDINGS: Radiotracer uptake on the ventilation study is homogeneous and symmetric bilaterally.  Radiotracer uptake on the perfusion study is homogeneous and symmetric bilaterally.  There is no appreciable ventilation/perfusion mismatch.  IMPRESSION: No ventilation or perfusion abnormalities. Very low probability of pulmonary embolus.   Electronically Signed   By: Lowella Grip M.D.   On: 03/20/2013 16:15   2D echo 03/22/2012- Study Conclusions  - Left ventricle: The cavity size was mildly dilated. Wall thickness was normal. Systolic function was mildly reduced. The estimated ejection fraction was in the range of 45% to 50%. There is hypokinesis of the lateral myocardium. Doppler parameters are consistent with restrictive physiology, indicative of decreased left ventricular  diastolic compliance and/or increased left atrial pressure. Doppler parameters are consistent with high ventricular filling pressure. - Aortic valve: Valve mobility was restricted. There was mild to moderate stenosis. Trivial regurgitation. Valve area: 1.56cm^2(VTI). Valve area: 1.47cm^2 (Vmax). - Mitral valve: Mild regurgitation. - Left atrium: The atrium was mildly dilated. - Pulmonary arteries: Systolic pressure was mildly increased. PA peak pressure: 102mm Hg (S).  Medications: I have reviewed the patient's current medications. Scheduled Meds: . aspirin  81 mg Oral Daily  . atorvastatin  80 mg Oral q1800  . bisacodyl  5 mg Oral BID  . ceFEPime (MAXIPIME) IV  2 g Intravenous Q24H  . diltiazem  120 mg Oral Daily  . feeding supplement (ENSURE COMPLETE)  237 mL Oral TID BM  . insulin aspart  0-9 Units Subcutaneous TID WC  . insulin glargine  28 Units Subcutaneous Daily  . isosorbide mononitrate  30 mg Oral Daily  . levothyroxine  150 mcg Oral Daily  . methocarbamol  500 mg Oral BID  . metoprolol tartrate  50 mg Oral Q6H  . mycophenolate  720 mg Oral BID  . pantoprazole  40 mg Oral Daily  . regadenoson  0.4 mg Intravenous Once  . sirolimus  2 mg Oral Daily  . sodium bicarbonate  650 mg Oral BID  . [START ON 03/31/2013] vancomycin  1,250 mg Intravenous Q24H   Continuous Infusions:   PRN Meds:.sodium chloride, oxyCODONE, sodium chloride  Assessment/Plan:  HCAP: Patient is improving with antibiotics. No more hemoptysis. Remains afebrile. Will need to continue supplemental oxygen for now given he is desatting to 88% on room air. Sputum culture grew normal oropharyngeal flora. I spoke with Dr. Baxter Flattery and we decided on transitioning patient to oral antibiotics today. -today is day 3 of antibiotics-->transition to PO levaquin $RemoveBef'750mg'[id number]$  q48 (  renal dosing) starting tomorrow morning (total of 8 day course planned) -d/c xopenex given pt is improving and increased risk of worsening his  tachycardia -ID consult- appreciate recs; no atypical infection discovered at this time (see above) -Pulmonology consult- no need for bronchoscopy at this time -PT/OT  Acute GI bleed w/ dropping hemoglobin: No more diarrhea or BRBPR. Hb stable today 7.1.  -GI consult- planning for EGD and colonoscopy on 1/23 -d/c heparin, ASA, plavix (1/20 first day without any of these medications)  Chest pain with hx of CAD, dCHF and current atrial tachycardia- No more chest pain. ACS ruled out twice so far this admission. Patient still with multiple episodes of atrial tachycardia overnight. Cardiology will begin diltiazem at low dose and renal has agreed to decrease sirolimus level to 2mg  daily given diltiazem can increase sirolimus levels. -diltiazem 120mg  daily (start 1/22) -telemetry  -cardiology consult- increased metoprolol tartrate to 50mg  q6h -holding ASA, plavix, heparin in setting of GI bleed -Continue cardiac medications: Nitroglycerin, lipitor, metoprolol, IMDUR, diltiazem  Anion Gap Metabolic acidosis: AG remains elevated, 17 today. Unclear etiology. Starvation ketoacidosis vs. medication side effect vs A/CKD. Sirolimus and mycophenolate can both cause acidosis, though unsure if they specifically can cause an increase in AG. Of note, sirolimus level on 1/19 was elevated at 23.9. 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 Cr a few weeks ago was 1.7-1.8. Possibly related to elevated sirolimus level.  -decrease sirolimus to 2mg  daily given we are starting diltiazem at low dose for HR control -renal following, appreciate recs  Metastatic Prostate cancer with back pain: Back pain is stable.  -Outpatient uro-oncology work up  HTN: stable -continue IMDUR 30mg  daily and metoprolol 50mg  q6h, diltiazem 120mg  daily  DM-II: A1c 7.8 on admission.  On Lantus 24 U daily at home. CBG control is fair: 142-177. -continue lantus 28U daily -SSI sensitive   DVT px: SCDs Diet: carb modified Dispo: patient not ready for discharge yet--defer until clinical improvement; perhaps tomorrow if he tolerates his EGD/colonoscopy well  The patient does have a current PCP (Sueanne Margarita, MD) and does not need an [facility] hospital follow-up appointment after discharge.  The patient does not have transportation limitations that hinder transportation to clinic appointments.  Services Needed at time of discharge: Y = Yes, Blank = No PT:   OT:   RN:   Equipment:   Other:     LOS: 10 days   Rebecca Eaton, MD 03/30/2013, 2:03 PM

## 2013-03-30 NOTE — Progress Notes (Signed)
Physical Therapy Treatment Patient Details Name: Joshua [name] MRN: [id number] DOB: [birth year] Today's Date: 03/30/2013 Time: 3710-6269 PT Time Calculation (min): 25 min  PT Assessment / Plan / Recommendation  History of Present Illness 63 y.o. male admitted to [facility] on 03/20/13 with PMH of HTN, DM2, CAD with stents, CAD, PVD s/p fem pop, prostate cancer treated with radiation, hx of thyroid cancer, h/o ESRD and kidney transplantation who presents for chest pain and back pain.  The chest pain start 3 days ago at rest, it is left sided, substernal, 8/10 at the start, throbbing and sharp, non radiating, possibly worse with breathing, however, definitely worse with palpation.  The pain happened every few hours.  He was intermittently tachycardic in the ED ranging from the 80s-140.  Cardiology was consulted in the ED.  Patient further reports back pain which has been slowly worsening over past few months.  He has had work up in the outpatient setting with a bone scan and MRI lumbar spine as there was concern for bony metastasis of his prostate cancer.  Found to have HCAP, GIB with acute blood loss s/p blood transfusion, acute on chronic kidney failure, back pain with possible bone mets.     PT Comments   Pt able to increase ambulation distance today. Pt requesting to attempt ambulating without RW; was unsteady and required handheld (A). Pt seemed very anxious during session; requiring cues for sequencing and safety. Pt to benefit from D/C home with RW to increase stability and decrease risk of falls. Discussed this recommendation with pt at end of session, pt agreeable. Will cont to follow per POC.   Follow Up Recommendations  Home health PT;Supervision for mobility/OOB     Does the patient have the potential to tolerate intense rehabilitation     Barriers to Discharge        Equipment Recommendations  Rolling walker with 5" wheels    Recommendations for Other Services Other (comment)  Frequency  Min 3X/week   Progress towards PT Goals Progress towards PT goals: Progressing toward goals  Plan Current plan remains appropriate    Precautions / Restrictions Precautions Precautions: Fall Restrictions Weight Bearing Restrictions: No   Pertinent Vitals/Pain Max HR during session 103    Mobility  Bed Mobility Overal bed mobility: Modified Independent General bed mobility comments: uses railing to pull to sitting; requires incr time; has difficulty at times sequencing Transfers Overall transfer level: Needs assistance Equipment used: 1 person hand held assist Transfers: Sit to/from Stand Sit to Stand: Min guard General transfer comment: min guard to increase stability during transfer; pt reaching for UE support during transfer; cues for sequencing  Ambulation/Gait Ambulation/Gait assistance: Min assist Ambulation Distance (Feet): 150 Feet Assistive device: 1 person hand held assist Gait Pattern/deviations: Step-through pattern;Decreased stride length;Wide base of support;Trunk flexed Gait velocity: decreased Gait velocity interpretation: Below normal speed for age/gender General Gait Details: pt with decrease stride length and slightly antalgic gt due to pain in Lt LE; pt requird handhedl (A) to stabilize; at times was reaching for UE support (primarily in bathroom); refused to ambulate with RW today; unsteady and required handheld (A) to stabilize; pt fatigued quickly; 1 standing rest break during ambulation; demo decreased safety awareness with gt primarily believed to be due to anxiety          PT Diagnosis:    PT Problem List:   PT Treatment Interventions:     PT Goals (current goals can now be found in the care plan section)  Acute Rehab PT Goals Patient Stated Goal: to go to the bathroom PT Goal Formulation: With patient/family Time For Goal Achievement: 04/11/13 Potential to Achieve Goals: Good  Visit Information  Last PT Received On: 03/30/13 Assistance Needed:  +1 History of Present Illness: 63 y.o. male admitted to [facility] on 03/20/13 with PMH of HTN, DM2, CAD with stents, CAD, PVD s/p fem pop, prostate cancer treated with radiation, hx of thyroid cancer, h/o ESRD and kidney transplantation who presents for chest pain and back pain.  The chest pain start 3 days ago at rest, it is left sided, substernal, 8/10 at the start, throbbing and sharp, non radiating, possibly worse with breathing, however, definitely worse with palpation.  The pain happened every few hours.  He was intermittently tachycardic in the ED ranging from the 80s-140.  Cardiology was consulted in the ED.  Patient further reports back pain which has been slowly worsening over past few months.  He has had work up in the outpatient setting with a bone scan and MRI lumbar spine as there was concern for bony metastasis of his prostate cancer.  Found to have HCAP, GIB with acute blood loss s/p blood transfusion, acute on chronic kidney failure, back pain with possible bone mets.      Subjective Data  Subjective: pt lying supine; agreeable to therapy. stated " i need to go to the bathroom" agreeable to ambulate to bathroom  Patient Stated Goal: to go to the bathroom   Cognition  Cognition Arousal/Alertness: Awake/alert Behavior During Therapy: Anxious Overall Cognitive Status: Impaired/Different from baseline Area of Impairment: Problem solving Problem Solving: Slow processing General Comments: At times he trails off, or takes longer to process what he is going to say.  It is really suttle, but there.  Otherwise A&O and generally appropriate.     Balance  Balance Overall balance assessment: Needs assistance Sitting-balance support: Feet supported;No upper extremity supported Sitting balance-Leahy Scale: Good Standing balance support: During functional activity;Single extremity supported Standing balance-Leahy Scale: Fair  End of Session PT - End of Session Equipment Utilized During Treatment:  Gait belt Activity Tolerance: Patient tolerated treatment well Patient left: in chair;with call bell/phone within reach Nurse Communication: Mobility status   GP     Gustavus Bryant, [place] 03/30/2013, 2:57 PM

## 2013-03-30 NOTE — Progress Notes (Signed)
[facility] Progress Note   Subjective:   Feeling well this am. No current complaints.    Objective:   BP 131/62  Pulse 74  Temp(Src) 98.9 F (37.2 C) (Oral)  Resp 14  Ht 5\' 11"  (1.803 m)  Wt 179 lb 14.3 oz (81.6 kg)  BMI 25.10 kg/m2  SpO2 96%  Intake/Output Summary (Last 24 hours) at 03/30/13 1131 Last data filed at 03/30/13 0736  Gross per 24 hour  Intake    480 ml  Output   1100 ml  Net   -620 ml   Physical Exam: Gen: resting in bed. NAD. Minimal asterixis.  CVS: RRR. 2/6 Systolic murmur. Resp: Bibasilar rales noted. Abd: soft, nontender, abdomen full secondary to renal transplant. Ext: No LE edema.   Imaging: Dg Chest 2 View  03/29/2013   CLINICAL DATA:  Shortness of breath and cough.  EXAM: CHEST  2 VIEW  COMPARISON:  Chest radiograph and CT of the chest performed 03/27/2013  FINDINGS: The lungs are well-aerated. Fluffy bilateral central airspace opacification is again noted bilaterally, perhaps slightly improved from the prior study at the right lung base. Small bilateral pleural effusions are noted. The appearance on CT is suspicious for multifocal pneumonia, though superimposed interstitial edema cannot be excluded. There is no evidence of pneumothorax.  The heart is borderline enlarged. No acute osseous abnormalities are seen. Postoperative change is seen about the thyroid bed. Clips are seen at the left axilla.  IMPRESSION: 1. Persistent fluffy bilateral central airspace opacification, perhaps slightly improved at the right lung base. Small bilateral pleural effusions seen. The appearance on CT is suspicious for significant multifocal pneumonia, though superimposed interstitial edema cannot be excluded. 2. Borderline cardiomegaly.   Electronically Signed   By: Garald Balding M.D.   On: 03/29/2013 01:31    Labs: BMET  Recent Labs Lab 03/27/13 1300 03/27/13 1725 03/27/13 2025 03/28/13 0620 03/28/13 1040 03/29/13 0352 03/30/13 0316  NA 133*  135* 135* 130* 131* 132* 135*  K 6.1* 5.4* 5.2 5.3 4.8 4.0 4.3  CL 92* 93* 92* 88* 88* 90* 93*  CO2 20 22 21 19 20 22 25   GLUCOSE 313* 279* 274* 365* 255* 204* 102*  BUN 50* 52* 52* 50* 49* 46* 41*  CREATININE 2.60* 2.71* 2.67* 2.50* 2.53* 2.31* 2.22*  CALCIUM 7.7* 8.2* 8.5 7.7* 7.9* 7.5* 7.2*   CBC  Recent Labs Lab 03/26/13 1120 03/27/13 0430 03/28/13 0620 03/28/13 0815 03/28/13 1745 03/29/13 0352 03/30/13 0316  WBC 8.3 9.1 11.0*  --   --  10.2 9.6  NEUTROABS 6.0  --  8.7*  --   --  7.7  --   HGB 7.7* 7.7* 5.7* 6.5* 8.1* 7.2* 7.1*  HCT 24.8* 25.0* 18.1* 20.7* 25.9* 22.5* 23.3*  MCV 74.7* 74.4* 73.0*  --   --  73.5* 75.9*  PLT 402* 395 363  --   --  318 339    Medications:     . aspirin  81 mg Oral Daily  . atorvastatin  80 mg Oral q1800  . bisacodyl  5 mg Oral BID  . ceFEPime (MAXIPIME) IV  2 g Intravenous Q24H  . diltiazem  120 mg Oral Daily  . feeding supplement (ENSURE COMPLETE)  237 mL Oral TID BM  . insulin aspart  0-9 Units Subcutaneous TID WC  . insulin glargine  28 Units Subcutaneous Daily  . isosorbide mononitrate  30 mg Oral Daily  . levothyroxine  150 mcg Oral Daily  . methocarbamol  500 mg Oral BID  . metoprolol tartrate  50 mg Oral Q6H  . mycophenolate  720 mg Oral BID  . pantoprazole  40 mg Oral Daily  . regadenoson  0.4 mg Intravenous Once  . sirolimus  2 mg Oral Daily  . sodium bicarbonate  650 mg Oral BID  . vancomycin  1,000 mg Intravenous Q24H    Assessment/ Plan:   Patient is a 63 y.o. male with a PMHx of HTN, DM-2, CAD s/p stenting of LAD, hx of papillary thyroid cancer, CKD III s/p Renal transplant, PVD, Carotid artery stenosis s/p right carotid endarterectomy, metastatic prostate cancerwho was admitted to [facility] on 03[facility]2013 for evaluation of chest pain.   1) Chest pain, Hx of CAD  2) Hx of Diastolic CHF  3) Acute on chronic kidney disease s/p history of kidney transplant, baseline creatinine 1.6-1.8 . FENA 0.18 c/w drug toxicity, low Hb,  and/or CHF AKI from Mtor toxicity with drug interaction.  Discussed with Dr. Marlou Porch, will use lower dose Sirolimus as needs better rate control.  Lab a huge issue with very, very ,very large delay in getting levels which may be dangerous to patient.   4) Anion Gap Metabolic acidosis  Resolved 5) Fever of Unknown origin  6) Metastatic prostate cancer  7) HTN  8) DM-2  9) Atrial Tachycardia  10) Anemia  11) Hyperkalemia   Plan:  Acute on chronic kidney disease s/p history of kidney transplant, baseline creatinine 1.6-1.8  - Still awaiting sirolimus level. - Creatinine continues to improve. - Will continue to follow closely; anticipate continued improvement.  Fever, hemoptysis, Xray findings of Multifocal pneumonia (HCAP) - ID following and Pulm has seen/evaluated patient.  - Sirolimus toxicity could be contributing (due to increased immunosuppression).  - BK virus, EBV and CMV PCR's negative. - Vanc and Cefepime for HCAP; Per ID and Primary team.  Anemia - Iron 17, % Saturation of 10; IV Feraheme and Aranesp given 1/19; s/p Transfusion 1U PRBC's on 1/20. - Recent history of BRBPR. - Hb stable this am @ 7.1 - GI following - Patient to go for EGD/Colonoscopy 1/23   Hyperkalemia & Metabolic Acidosis, AG  - Hyperkalemia resolved.  - AG 17 this am. Continuing TID Bicarb - Will continue to monitor closely.   Coral Spikes, DO PGY-2, [facility] 03/30/2013, 11:31 AM

## 2013-03-30 NOTE — Progress Notes (Signed)
Pharmacy Consult: Vanco/Cefepime for PNA  Admit Complaint: chest pain  PMH: DM, CHF, Prostate CA, stroke, MI, AS, hypothyroid, HTN, HLD  Anticoagulation: SCD. Hgb down to 7.1. GIB  Infectious Disease: D#3 Vanc/Cefepime for HCAP, Tmax 99.7, WBC 9.6, CXR c/w multifocal pneumonia  1/20 Vanc>> 1/20 Cefepime>>  1/19: BC x2: pending 1/19 resp>> normal flora 1/19 urine>>negative RSV negative  Cardiovascular: CAD hx MI - plavix d/c'ed due to bloody stools/hemoptysis, continue low dose aspirin, lipitor, Diltiazem, imdur, metoprolol, VSS  Endocrinology: Synthroid, DM: SSI +Lantus with CBGs 109-200  Gastrointestinal / Nutrition: GI following for bloody stools, may need endoscopy, po PPI/daily  Neurology  Nephrology: ARF, CCr trending down 2.5-->2.31>>2.22 , lytes ok, keep K>/=4 given arrhythmias.  Pulmonary: 2L  Hematology / Oncology: Hgb 7.1 s/p 2 PRBCs on 1/20  PTA Medication Issues  Best Practices: considering SCDs per cardiology note, no anticoag with GI bleed  Goals: Vanco trough 15-20  Plan: 1. Increase Vancomycin to 1250mg  IV q24h starting 1/23 (already had dose today) 2. Cefepime 2g IV q24h dose ok.  Yosmar Ryker S. Alford Highland, PharmD, [place] Clinical Staff Pharmacist Pager [phone]

## 2013-03-30 NOTE — Progress Notes (Addendum)
Subjective:  Overall feels better than yesterday. No significant shortness of breath currently. Laying comfortably in bed. No further bleeding, no further blood-tinged mucus.  Still having several bouts of paroxysmal atrial tachycardia however despite high dose metoprolol.  Objective:  Vital Signs in the last 24 hours: Temp:  [98.7 F (37.1 C)-99.7 F (37.6 C)] 98.9 F (37.2 C) (01/22 0719) Pulse Rate:  [74-140] 74 (01/22 0719) Resp:  [12-21] 14 (01/22 0719) BP: (120-146)/(60-79) 131/62 mmHg (01/22 0719) SpO2:  [90 %-96 %] 96 % (01/22 0719) Weight:  [179 lb 14.3 oz (81.6 kg)] 179 lb 14.3 oz (81.6 kg) (01/22 0500)  Intake/Output from previous day: 01/21 0701 - 01/22 0700 In: 980 [P.O.:480; IV Piggyback:500] Out: 900 [Urine:900]   Physical Exam: General: Appears comfortable, in no acute distress. Head:  Normocephalic and atraumatic. Lungs: Scattered rhonchi, minimal wheeze heard this morning. Normal-appearing respiratory effort Heart: Normal S1 and S2.  2/6 systolic murmur right upper sternal border, rubs or gallops. No JVD Abdomen: soft, non-tender, positive bowel sounds. Extremities: No clubbing or cyanosis. No edema. Neurologic: Alert and oriented x 3. Skin: No rashes, warm Neurologic: Nonfocal, occasional twitch noted  Lab Results:  Recent Labs  03/29/13 0352 03/30/13 0316  WBC 10.2 9.6  HGB 7.2* 7.1*  PLT 318 339    Recent Labs  03/29/13 0352 03/30/13 0316  NA 132* 135*  K 4.0 4.3  CL 90* 93*  CO2 22 25  GLUCOSE 204* 102*  BUN 46* 41*  CREATININE 2.31* 2.22*    Recent Labs  03/27/13 1615 03/27/13 2310  TROPONINI <0.30 <0.30   Hepatic Function Panel  Recent Labs  03/27/13 1300  PROT 7.5  ALBUMIN 2.1*  AST 45*  ALT 26  ALKPHOS 385*  BILITOT 0.3  BILIDIR <0.2  IBILI NOT CALCULATED   Imaging: Dg Chest 2 View  03/29/2013   CLINICAL DATA:  Shortness of breath and cough.  EXAM: CHEST  2 VIEW  COMPARISON:  Chest radiograph and CT of the  chest performed 03/27/2013  FINDINGS: The lungs are well-aerated. Fluffy bilateral central airspace opacification is again noted bilaterally, perhaps slightly improved from the prior study at the right lung base. Small bilateral pleural effusions are noted. The appearance on CT is suspicious for multifocal pneumonia, though superimposed interstitial edema cannot be excluded. There is no evidence of pneumothorax.  The heart is borderline enlarged. No acute osseous abnormalities are seen. Postoperative change is seen about the thyroid bed. Clips are seen at the left axilla.  IMPRESSION: 1. Persistent fluffy bilateral central airspace opacification, perhaps slightly improved at the right lung base. Small bilateral pleural effusions seen. The appearance on CT is suspicious for significant multifocal pneumonia, though superimposed interstitial edema cannot be excluded. 2. Borderline cardiomegaly.   Electronically Signed   By: Garald Balding M.D.   On: 03/29/2013 01:31   Personally viewed.   Telemetry: Multiple bursts of paroxysmal atrial tachycardia some lasting a few hours duration with heart rate in the 130 range. Personally viewed.    Cardiac Studies:  EF 45-50% with hypokinesis of lateral myocardium  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   Coronary artery disease   Hypertension   Dyslipidemia, goal LDL below 70   Hypothyroidism   Back pain   Prostate cancer   Protein-calorie malnutrition, severe   Chronic diastolic CHF (congestive heart failure)   Atrial tachycardia   Anemia  Nonspecific abnormal finding in stool contents  63 year-old status post renal transplant, coronary artery disease, mildly reduced ejection fraction, persistent bouts of paroxysmal atrial tachycardia, recent fever, possible bilateral pneumonia, chronic kidney disease stage IV.  1. Paroxysmal atrial  tachycardia-he needs to have several bouts of this despite up titration to maximum dose of metoprolol. Previously, diltiazem seem to control these paroxysmal atrial tachycardia episodes better however dose adjustment needs to be made with sirolimus if diltiazem is going to be utilized. His last sirolimus level on 1/19 was 23.9, elevated. On 1/14 it was 11. This elevated level was in the setting of diltiazem previously.  I discussed this with Dr. Jimmy Footman and I would like to trial low-dose diltiazem once again to see if this helps him with his PAT. We will dose adjust sirolimus from 3 mg down to 2 mg. Hopefully, addition of diltiazem will help his PAT.  I would like to try to avoid amiodarone use given long-term toxicity, recent pulmonary issues.  2. CAD-no active anginal issues. Currently Plavix is on hold because of recent GI bleeding/anemia. Continue to hold for now.  3. GI bleed-as above. Status post transfusion. Currently stable hemoglobin.  4. Mild to moderate aortic stenosis-should not be clinically significant at this point.  5. Acute on chronic diastolic heart failure-appears fairly euvolemic. Doing well.  6. Prostate cancer-there is mention of bony metastasis to the spine in previous notes.  35 minutes spent with patient, consulting other physicians, review with nursing, lab work.  Jennah Satchell, [place] 03/30/2013, 11:16 AM

## 2013-03-30 NOTE — Progress Notes (Signed)
Subjective  Some pain RLQ of the abdomen, no overt bleeding   Objective  Heme ;positive anemia, Hgb down to 7.1 today,  Planning to do colonoscopy prep today, for EGD/colon tomorrow Vital signs in last 24 hours: Temp:  [98.7 F (37.1 C)-99.7 F (37.6 C)] 98.9 F (37.2 C) (01/22 0719) Pulse Rate:  [74-140] 74 (01/22 0719) Resp:  [12-21] 14 (01/22 0719) BP: (120-146)/(60-79) 131/62 mmHg (01/22 0719) SpO2:  [90 %-96 %] 96 % (01/22 0719) Weight:  [179 lb 14.3 oz (81.6 kg)] 179 lb 14.3 oz (81.6 kg) (01/22 0500) Last BM Date: 03/27/13 General:    AA male in NAD Heart:  Regular rate and rhythm; no murmurs Lungs: Respirations even and unlabored, lungs CTA bilaterally Abdomen:  Soft, tender RLQ, and nondistended. Normal bowel sounds. Extremities:  Without edema. Neurologic:  Alert and oriented,  grossly normal neurologically. Psych:  Cooperative. Normal mood and affect.  Intake/Output from previous day: 01/21 0701 - 01/22 0700 In: 980 [P.O.:480; IV Piggyback:500] Out: 900 [Urine:900] Intake/Output this shift: Total I/O In: 240 [P.O.:240] Out: 200 [Urine:200]  Lab Results:  Recent Labs  03/28/13 0620  03/28/13 1745 03/29/13 0352 03/30/13 0316  WBC 11.0*  --   --  10.2 9.6  HGB 5.7*  < > 8.1* 7.2* 7.1*  HCT 18.1*  < > 25.9* 22.5* 23.3*  PLT 363  --   --  318 339  < > = values in this interval not displayed. BMET  Recent Labs  03/28/13 1040 03/29/13 0352 03/30/13 0316  NA 131* 132* 135*  K 4.8 4.0 4.3  CL 88* 90* 93*  CO2 20 22 25   GLUCOSE 255* 204* 102*  BUN 49* 46* 41*  CREATININE 2.53* 2.31* 2.22*  CALCIUM 7.9* 7.5* 7.2*   LFT  Recent Labs  03/27/13 1300  PROT 7.5  ALBUMIN 2.1*  AST 45*  ALT 26  ALKPHOS 385*  BILITOT 0.3  BILIDIR <0.2  IBILI NOT CALCULATED   PT/INR No results found for this basename: LABPROT, INR,  in the last 72 hours  Studies/Results: Dg Chest 2 View  03/29/2013   CLINICAL DATA:  Shortness of breath and cough.  EXAM: CHEST   2 VIEW  COMPARISON:  Chest radiograph and CT of the chest performed 03/27/2013  FINDINGS: The lungs are well-aerated. Fluffy bilateral central airspace opacification is again noted bilaterally, perhaps slightly improved from the prior study at the right lung base. Small bilateral pleural effusions are noted. The appearance on CT is suspicious for multifocal pneumonia, though superimposed interstitial edema cannot be excluded. There is no evidence of pneumothorax.  The heart is borderline enlarged. No acute osseous abnormalities are seen. Postoperative change is seen about the thyroid bed. Clips are seen at the left axilla.  IMPRESSION: 1. Persistent fluffy bilateral central airspace opacification, perhaps slightly improved at the right lung base. Small bilateral pleural effusions seen. The appearance on CT is suspicious for significant multifocal pneumonia, though superimposed interstitial edema cannot be excluded. 2. Borderline cardiomegaly.   Electronically Signed   By: Garald Balding M.D.   On: 03/29/2013 01:31       Assessment / Plan:    Microcytic anemia, chronic. Drop of nearly 3 grams over 7 days.  One unit PRBCs given 03/28/13.  * Limited rectal bleeding occurred after kayexelate caused copious diarrhea. Resolved.  Amount of rectal bleeding does not seem sufficient to explain the level of anemia.  Rule out hemorrhoidal, rule out rectal/anal irritation from the diarrhea. Rule out  radiation proctitis.  * Diffuse multifocal PNA. ID following, immunosuppressed. Pulmonary does not plan bronchoscopy.  * CAD. 03/2009 non STEMI: s/p BMS to LAD, chronically occluded RCA. On chronic Plavix, this now on hold as of today.  acute diastolic and systolic heart failure. EF 45 to 50%  Note Dr Kingsley Plan statement that pt is "Plavix nonresponder - Plavix discontinued by IM due to bloody stools and hemoptysis. Would not change to Effient in future due to possible hx CVA"  * S/p renal transplant 2001. Chronic  immunosuppressives. Addition of Diltiazem 1/14 felt to have led to Sirolimus toxicity and ARF.  Improving renal function.  * Prostate cancer. Initial diagnoses ~ 2000/2001. Treated with radiation. Per pt, Dr Storm Frisk and resident's notes pt has T spine mets. I can not find studies to support this diagnoses of mets. Pt says he is scheduled for MRI with biopsy today, again I am unable to find this in Epic.  * IDDM.  For EGD/colon tomorrow at 11.00 am  Principal Problem:   Unstable angina Active Problems:   Occlusion and stenosis of carotid artery without mention of cerebral infarction   Coronary artery disease   Hypertension   Dyslipidemia, goal LDL below 70   Hypothyroidism   Back pain   Prostate cancer   Protein-calorie malnutrition, severe   Chronic diastolic CHF (congestive heart failure)   Atrial tachycardia   Anemia   Nonspecific abnormal finding in stool contents     LOS: 10 days   Delfin Edis  03/30/2013, 8:53 AM

## 2013-03-30 NOTE — Progress Notes (Signed)
[facility]    Date of Admission:  03/20/2013   Total days of antibiotics 3           ID: Joshua [name] is a 63 y.o. male who is an immunocompromised host found to have bilateral infiltrates but also hemoptysis concern for pna vs. Pulmonary aveolar hemorrhage Principal Problem:   Unstable angina Active Problems:   Occlusion and stenosis of carotid artery without mention of cerebral infarction   Coronary artery disease   Hypertension   Dyslipidemia, goal LDL below 70   Hypothyroidism   Back pain   Prostate cancer   Protein-calorie malnutrition, severe   Chronic diastolic CHF (congestive heart failure)   Atrial tachycardia   Anemia   Nonspecific abnormal finding in stool contents    Subjective: Afebrile, improved cough  Medications:  . aspirin  81 mg Oral Daily  . atorvastatin  80 mg Oral q1800  . bisacodyl  5 mg Oral BID  . diltiazem  120 mg Oral Daily  . feeding supplement (ENSURE COMPLETE)  237 mL Oral TID BM  . insulin aspart  0-9 Units Subcutaneous TID WC  . insulin glargine  28 Units Subcutaneous Daily  . isosorbide mononitrate  30 mg Oral Daily  . [START ON 03/31/2013] levofloxacin  750 mg Oral Q48H  . levothyroxine  150 mcg Oral Daily  . methocarbamol  500 mg Oral BID  . metoprolol tartrate  50 mg Oral Q6H  . mycophenolate  720 mg Oral BID  . pantoprazole  40 mg Oral Daily  . peg 3350 powder  0.5 kit Oral Once   And  . [START ON 03/31/2013] peg 3350 powder  0.5 kit Oral Once  . regadenoson  0.4 mg Intravenous Once  . sirolimus  2 mg Oral Daily  . sodium bicarbonate  650 mg Oral BID    Objective: Vital signs in last 24 hours: Temp:  [98.5 F (36.9 C)-99.7 F (37.6 C)] 98.5 F (36.9 C) (01/22 1152) Pulse Rate:  [74-163] 163 (01/22 1152) Resp:  [12-21] 20 (01/22 1152) BP: (120-146)/(57-79) 139/57 mmHg (01/22 1152) SpO2:  [90 %-96 %] 94 % (01/22 1152) Weight:  [179 lb 14.3 oz (81.6 kg)] 179 lb 14.3 oz (81.6 kg) (01/22  0500)  Constitutional: He is oriented to person, place, and time. He appears well-developed and well-nourished. No distress.  HENT:  Mouth/Throat: Oropharynx is clear and moist. No oropharyngeal exudate.  Cardiovascular: Normal rate, regular rhythm and normal heart sounds. Exam reveals no gallop and no friction rub.  No murmur heard.  Pulmonary/Chest: Effort normal and breath sounds normal. No respiratory distress. Bilateral wheezing Abdominal: Soft. Bowel sounds are normal. He exhibits no distension. There is no tenderness.  Lymphadenopathy:  He has no cervical adenopathy.  Neurological: He is alert and oriented to person, place, and time.  Skin: Skin is warm and dry. No rash noted. No erythema.  Psychiatric: He has a normal mood and affect. His behavior is normal.    Lab Results  Recent Labs  03/29/13 0352 03/30/13 0316  WBC 10.2 9.6  HGB 7.2* 7.1*  HCT 22.5* 23.3*  NA 132* 135*  K 4.0 4.3  CL 90* 93*  CO2 22 25  BUN 46* 41*  CREATININE 2.31* 2.22*   Liver Panel No results found for this basename: PROT, ALBUMIN, AST, ALT, ALKPHOS, BILITOT, BILIDIR, IBILI,  in the last 72 hours  Microbiology: Sputum = NOPF Blood = ngtd RVP = negative Studies/Results: Dg Chest 2  View  03/29/2013   CLINICAL DATA:  Shortness of breath and cough.  EXAM: CHEST  2 VIEW  COMPARISON:  Chest radiograph and CT of the chest performed 03/27/2013  FINDINGS: The lungs are well-aerated. Fluffy bilateral central airspace opacification is again noted bilaterally, perhaps slightly improved from the prior study at the right lung base. Small bilateral pleural effusions are noted. The appearance on CT is suspicious for multifocal pneumonia, though superimposed interstitial edema cannot be excluded. There is no evidence of pneumothorax.  The heart is borderline enlarged. No acute osseous abnormalities are seen. Postoperative change is seen about the thyroid bed. Clips are seen at the left axilla.  IMPRESSION: 1.  Persistent fluffy bilateral central airspace opacification, perhaps slightly improved at the right lung base. Small bilateral pleural effusions seen. The appearance on CT is suspicious for significant multifocal pneumonia, though superimposed interstitial edema cannot be excluded. 2. Borderline cardiomegaly.   Electronically Signed   By: Garald Balding M.D.   On: 03/29/2013 01:31     Assessment/Plan: hcap  = currently on day 3vanco and cefepime, can finish 8 day course of therapy to levofloxacin or cefpodoxime.   Baxter Flattery [facility] Cell: [phone] Pager: [phone]  03/30/2013, 3:08 PM

## 2013-03-31 ENCOUNTER — Inpatient Hospital Stay (HOSPITAL_COMMUNITY): Payer: Medicare Other

## 2013-03-31 ENCOUNTER — Encounter (HOSPITAL_COMMUNITY): Payer: Self-pay

## 2013-03-31 ENCOUNTER — Encounter (HOSPITAL_COMMUNITY)
Admission: EM | Disposition: A | Discharge status: Home / Self Care | Payer: Self-pay | Source: Home / Self Care | Attending: Internal Medicine

## 2013-03-31 DIAGNOSIS — D509 Iron deficiency anemia, unspecified: Secondary | ICD-10-CM | POA: Insufficient documentation

## 2013-03-31 HISTORY — PX: COLONOSCOPY: SHX5424

## 2013-03-31 HISTORY — PX: ESOPHAGOGASTRODUODENOSCOPY: SHX5428

## 2013-03-31 LAB — GLUCOSE, CAPILLARY
GLUCOSE-CAPILLARY: 143 mg/dL — AB (ref 70–99)
GLUCOSE-CAPILLARY: 128 mg/dL — AB (ref 70–99)
GLUCOSE-CAPILLARY: 107 mg/dL — AB (ref 70–99)
Glucose-Capillary: 63 mg/dL — ABNORMAL LOW (ref 70–99)
Glucose-Capillary: 135 mg/dL — ABNORMAL HIGH (ref 70–99)
Glucose-Capillary: 100 mg/dL — ABNORMAL HIGH (ref 70–99)
Glucose-Capillary: 199 mg/dL — ABNORMAL HIGH (ref 70–99)
Glucose-Capillary: 191 mg/dL — ABNORMAL HIGH (ref 70–99)

## 2013-03-31 LAB — CBC
HCT: 25.7 % — ABNORMAL LOW (ref 39.0–52.0)
Hemoglobin: 8 g/dL — ABNORMAL LOW (ref 13.0–17.0)
MCH: 23.5 pg — AB (ref 26.0–34.0)
MCHC: 31.1 g/dL (ref 30.0–36.0)
MCV: 75.6 fL — ABNORMAL LOW (ref 78.0–100.0)
PLATELETS: 373 10*3/uL (ref 150–400)
RBC: 3.4 MIL/uL — ABNORMAL LOW (ref 4.22–5.81)
RDW: 18.8 % — ABNORMAL HIGH (ref 11.5–15.5)
WBC: 9.8 10*3/uL (ref 4.0–10.5)

## 2013-03-31 LAB — BASIC METABOLIC PANEL
BUN: 37 mg/dL — ABNORMAL HIGH (ref 6–23)
CALCIUM: 7.4 mg/dL — AB (ref 8.4–10.5)
CO2: 25 mEq/L (ref 19–32)
Chloride: 93 mEq/L — ABNORMAL LOW (ref 96–112)
Creatinine, Ser: 2.1 mg/dL — ABNORMAL HIGH (ref 0.50–1.35)
GFR calc non Af Amer: 32 mL/min — ABNORMAL LOW (ref >90)
GFR, EST AFRICAN AMERICAN: 37 mL/min — AB (ref >90)
Glucose, Bld: 52 mg/dL — ABNORMAL LOW (ref 70–99)
POTASSIUM: 4.3 meq/L (ref 3.7–5.3)
Sodium: 136 mEq/L — ABNORMAL LOW (ref 137–147)

## 2013-03-31 LAB — MISCELLANEOUS TEST

## 2013-03-31 LAB — ASPERGILLUS GALACTOMANNAN ANTIGEN
ASPERGILLUS GALACTOMANNAN AG: NOT DETECTED
ASPERGILLUS GALACTOMANNAN INDEX: 0.11 (ref <0.50)

## 2013-03-31 SURGERY — COLONOSCOPY
Anesthesia: Moderate Sedation

## 2013-03-31 MED ORDER — FENTANYL CITRATE 0.05 MG/ML IJ SOLN
INTRAMUSCULAR | Status: AC
  Filled 2013-03-31: qty 4

## 2013-03-31 MED ORDER — ISOSORBIDE MONONITRATE ER 30 MG PO TB24: 30.0000 mg | ORAL_TABLET | Freq: Every day | ORAL | Status: DC

## 2013-03-31 MED ORDER — DEXTROSE 50 % IV SOLN
1.0000 | Freq: Once | INTRAVENOUS | Status: AC
  Administered 2013-03-31: 50 mL via INTRAVENOUS
  Filled 2013-03-31: qty 50

## 2013-03-31 MED ORDER — MIDAZOLAM HCL 10 MG/2ML IJ SOLN
INTRAMUSCULAR | Status: DC | PRN
  Administered 2013-03-31 (×2): 2 mg via INTRAVENOUS
  Administered 2013-03-31: 1 mg via INTRAVENOUS
  Administered 2013-03-31: 2 mg via INTRAVENOUS

## 2013-03-31 MED ORDER — DIPHENHYDRAMINE HCL 50 MG/ML IJ SOLN
INTRAMUSCULAR | Status: DC | PRN
  Administered 2013-03-31: 25 mg via INTRAVENOUS

## 2013-03-31 MED ORDER — BUTAMBEN-TETRACAINE-BENZOCAINE 2-2-14 % EX AERO
INHALATION_SPRAY | CUTANEOUS | Status: DC | PRN
  Administered 2013-03-31: 2 via TOPICAL

## 2013-03-31 MED ORDER — DIPHENHYDRAMINE HCL 50 MG/ML IJ SOLN
INTRAMUSCULAR | Status: AC
  Filled 2013-03-31: qty 1

## 2013-03-31 MED ORDER — DEXTROSE 50 % IV SOLN
INTRAVENOUS | Status: AC
  Filled 2013-03-31: qty 50

## 2013-03-31 MED ORDER — MORPHINE SULFATE 4 MG/ML IJ SOLN
INTRAMUSCULAR | Status: DC | PRN
  Administered 2013-03-31 (×3): 2 mg via INTRAVENOUS

## 2013-03-31 MED ORDER — DEXTROSE 50 % IV SOLN
25.0000 g | Freq: Once | INTRAVENOUS | Status: AC
  Administered 2013-03-31: 25 g via INTRAVENOUS

## 2013-03-31 MED ORDER — MIDAZOLAM HCL 5 MG/ML IJ SOLN
INTRAMUSCULAR | Status: AC
  Filled 2013-03-31: qty 2

## 2013-03-31 MED ORDER — MORPHINE SULFATE 4 MG/ML IJ SOLN
INTRAMUSCULAR | Status: AC
  Filled 2013-03-31: qty 2

## 2013-03-31 NOTE — Progress Notes (Signed)
1700 back to rm 3e 18  Ambulated with supervision . Kept comfortable in bed. Dinner served. . Wife at bedside

## 2013-03-31 NOTE — Progress Notes (Signed)
[facility] Progress Note   Subjective:   Feeling well this am. He does report that his abdomen is full and tighter than usual.   Objective:   BP 128/70  Pulse 96  Temp(Src) 99.8 F (37.7 C) (Oral)  Resp 20  Ht 5\' 11"  (1.803 m)  Wt 181 lb 10.5 oz (82.4 kg)  BMI 25.35 kg/m2  SpO2 98%  Intake/Output Summary (Last 24 hours) at 03/31/13 0636 Last data filed at 03/30/13 2100  Gross per 24 hour  Intake    850 ml  Output    900 ml  Net    -50 ml   Physical Exam: Gen: resting in bed. NAD. No asterixis this am. CVS: RRR. 2/6 Systolic murmur. Resp: Minimal rales in the bases this am.  Abd: soft, nontender, abdomen distended.  Ext: No LE edema.   Imaging: No results found.  Labs: BMET  Recent Labs Lab 03/27/13 1725 03/27/13 2025 03/28/13 0620 03/28/13 1040 03/29/13 0352 03/30/13 0316 03/31/13 0500  NA 135* 135* 130* 131* 132* 135* 136*  K 5.4* 5.2 5.3 4.8 4.0 4.3 4.3  CL 93* 92* 88* 88* 90* 93* 93*  CO2 22 21 19 20 22 25 25   GLUCOSE 279* 274* 365* 255* 204* 102* 52*  BUN 52* 52* 50* 49* 46* 41* 37*  CREATININE 2.71* 2.67* 2.50* 2.53* 2.31* 2.22* 2.10*  CALCIUM 8.2* 8.5 7.7* 7.9* 7.5* 7.2* 7.4*   CBC  Recent Labs Lab 03/26/13 1120  03/28/13 0620  03/28/13 1745 03/29/13 0352 03/30/13 0316 03/31/13 0500  WBC 8.3  < > 11.0*  --   --  10.2 9.6 9.8  NEUTROABS 6.0  --  8.7*  --   --  7.7  --   --   HGB 7.7*  < > 5.7*  < > 8.1* 7.2* 7.1* 8.0*  HCT 24.8*  < > 18.1*  < > 25.9* 22.5* 23.3* 25.7*  MCV 74.7*  < > 73.0*  --   --  73.5* 75.9* 75.6*  PLT 402*  < > 363  --   --  318 339 373  < > = values in this interval not displayed.  Medications:     . aspirin  81 mg Oral Daily  . atorvastatin  80 mg Oral q1800  . diltiazem  120 mg Oral Daily  . feeding supplement (ENSURE COMPLETE)  237 mL Oral TID BM  . insulin aspart  0-9 Units Subcutaneous TID WC  . insulin glargine  28 Units Subcutaneous Daily  . isosorbide mononitrate  30 mg Oral Daily   . levofloxacin  750 mg Oral Q48H  . levothyroxine  150 mcg Oral Daily  . methocarbamol  500 mg Oral BID  . metoprolol tartrate  50 mg Oral Q6H  . mycophenolate  720 mg Oral BID  . pantoprazole  40 mg Oral Daily  . regadenoson  0.4 mg Intravenous Once  . sirolimus  2 mg Oral Daily  . sodium bicarbonate  650 mg Oral BID    Assessment/ Plan:   Patient is a 63 y.o. male with a PMHx of HTN, DM-2, CAD s/p stenting of LAD, hx of papillary thyroid cancer, CKD III s/p Renal transplant, PVD, Carotid artery stenosis s/p right carotid endarterectomy, metastatic prostate cancer who was admitted to [facility] on 03/20/2013 for evaluation of chest pain.   1) Chest pain, Hx of CAD  2) Hx of Diastolic CHF  3) Acute on chronic kidney disease s/p history of kidney transplant, baseline creatinine  1.6-1.8 . FENA 0.18.  In setting of Diltiazem use (known inhibitor of Sirolimus metabolism).  Sirolimus level returned elevated at 23.9 confirming suspicion of drug toxicity induced acute renal failure. Sirolimus toxicity resolve. Will be challenge juggling dose/level with delay in levels here. Improving GFR 4) Anion Gap Metabolic acidosis - Resolved 5) Fever of Unknown origin  6) Metastatic prostate cancer  7) HTN  8) DM-2  9) Atrial Tachycardia Discussed with Dr. Marlou Porch, will try Cardiazem and lower dose Sirolumus 10) Anemia  11) Hyperkalemia   Plan:  Acute on chronic kidney disease s/p history of kidney transplant, baseline creatinine 1.6-1.8  - Sirolimus level returned markedly elevated at 23.9, confirming suspicion of drug toxicity causing worsening kidney function. - Creatinine slowly improving.  Now nearly at baseline. - Sirolimus now 2 mg daily as Diltiazem is now back on board for rate control for PAT.  Use was discussed with Cardiologist Dr. Marlou Porch. - Will continue to follow along closely.   Fever, hemoptysis, Xray findings of Multifocal pneumonia; Patient being treated for HCAP. - BK virus, EBV and CMV  PCR's negative. - Vanc and Cefepime for HCAP; Per ID and Primary team.  Anemia - Iron 17, % Saturation of 10; IV Feraheme and Aranesp given 1/19; s/p Transfusion 1U PRBC's on 1/20. - Recent history of BRBPR. - Hb stable.  - GI following - Patient to go for EGD/Colonoscopy this am.  Hyperkalemia & Metabolic Acidosis, AG  - Hyperkalemia resolved.  - Bicarb normal at 25; Continuing bicarb (now decreased to 650 mg BID).  Coral Spikes, DO PGY-2, [facility] 03/31/2013, 6:36 AM I have seen and examined this patient and agree with the plan of care seen, examined, and eval.  .  Claudie Rathbone L 03/31/2013, 1:58 PM

## 2013-03-31 NOTE — Progress Notes (Signed)
Patient: Joshua [name] / Admit Date: 03/20/2013 / Date of Encounter: 03/31/2013, 9:40 AM   Subjective  Feeling somewhat better. No CP this AM but did have an episode last night lasting 5-10 mins. No SOB. No orthopnea, LEE. Not fully prepped for colo yet.  Objective   Telemetry: NSR, occ PACs, PVCs, transient vent bigeminy. No PAT since around 1am.  Physical Exam: Blood pressure 128/70, pulse 96, temperature 99.8 F (37.7 C), temperature source Oral, resp. rate 20, height $RemoveBe'5\' 11"'znfAjagwJ$  (1.803 m), weight 181 lb 10.5 oz (82.4 kg), SpO2 98.00%. General: Well developed, well nourished AAM, in no acute distress. Laying 15 degrees in bed  Head: Normocephalic, atraumatic, sclera non-icteric, no xanthomas, nares are without discharge.  Neck: JVP not elevated.  Lungs: Very rare wheeze but improved aeration. No rales or rhonchi. Breathing is unlabored.  Heart: RRR S1 S2 without murmurs, rubs, or gallops.  Abdomen: Soft, non-tender, non-distended with normoactive bowel sounds. No rebound/guarding.  Extremities: No clubbing or cyanosis. No edema. Distal pedal pulses are 2+ and equal bilaterally.  Neuro: Alert and oriented X 3. Moves all extremities spontaneously.  Psych: Responds to questions appropriately with a normal affect.   Intake/Output Summary (Last 24 hours) at 03/31/13 0940 Last data filed at 03/31/13 0700  Gross per 24 hour  Intake    360 ml  Output    701 ml  Net   -341 ml    Inpatient Medications:  . aspirin  81 mg Oral Daily  . atorvastatin  80 mg Oral q1800  . diltiazem  120 mg Oral Daily  . feeding supplement (ENSURE COMPLETE)  237 mL Oral TID BM  . insulin aspart  0-9 Units Subcutaneous TID WC  . insulin glargine  28 Units Subcutaneous Daily  . isosorbide mononitrate  30 mg Oral Daily  . levofloxacin  750 mg Oral Q48H  . levothyroxine  150 mcg Oral Daily  . methocarbamol  500 mg Oral BID  . metoprolol tartrate  50 mg Oral Q6H  . mycophenolate  720 mg Oral BID  .  pantoprazole  40 mg Oral Daily  . regadenoson  0.4 mg Intravenous Once  . sirolimus  2 mg Oral Daily  . sodium bicarbonate  650 mg Oral BID   Infusions:    Labs:  Recent Labs  03/30/13 0316 03/31/13 0500  NA 135* 136*  K 4.3 4.3  CL 93* 93*  CO2 25 25  GLUCOSE 102* 52*  BUN 41* 37*  CREATININE 2.22* 2.10*  CALCIUM 7.2* 7.4*   No results found for this basename: AST, ALT, ALKPHOS, BILITOT, PROT, ALBUMIN,  in the last 72 hours  Recent Labs  03/29/13 0352 03/30/13 0316 03/31/13 0500  WBC 10.2 9.6 9.8  NEUTROABS 7.7  --   --   HGB 7.2* 7.1* 8.0*  HCT 22.5* 23.3* 25.7*  MCV 73.5* 75.9* 75.6*  PLT 318 339 373   No results found for this basename: CKTOTAL, CKMB, TROPONINI,  in the last 72 hours No components found with this basename: POCBNP,  No results found for this basename: HGBA1C,  in the last 72 hours   Radiology/Studies:  Dg Chest 2 View  03/31/2013   CLINICAL DATA:  Cough and fever.  Follow-up pulmonary infiltrates.  EXAM: CHEST  2 VIEW  COMPARISON:  03/28/2013  FINDINGS: The cardiac silhouette remains enlarged, unchanged. Patchy bilateral airspace opacities are again seen, greatest in the upper lobes. Left lower lobe opacity has slightly increased from the prior study. Opacities elsewhere  appear stable to minimally improved. Small bilateral pleural effusions are unchanged. No pneumothorax is identified. Surgical clips are again seen in the lower neck and axillae. No acute osseous abnormality is seen.  IMPRESSION: Patchy bilateral airspace opacities concerning for pneumonia, stable to minimally improved in the upper lobes and slightly worsened in the left lower lobe. Unchanged effusions.   Electronically Signed   By: Logan Bores   On: 03/31/2013 08:51   Dg Chest 2 View  03/29/2013   CLINICAL DATA:  Shortness of breath and cough.  EXAM: CHEST  2 VIEW  COMPARISON:  Chest radiograph and CT of the chest performed 03/27/2013  FINDINGS: The lungs are well-aerated. Fluffy  bilateral central airspace opacification is again noted bilaterally, perhaps slightly improved from the prior study at the right lung base. Small bilateral pleural effusions are noted. The appearance on CT is suspicious for multifocal pneumonia, though superimposed interstitial edema cannot be excluded. There is no evidence of pneumothorax.  The heart is borderline enlarged. No acute osseous abnormalities are seen. Postoperative change is seen about the thyroid bed. Clips are seen at the left axilla.  IMPRESSION: 1. Persistent fluffy bilateral central airspace opacification, perhaps slightly improved at the right lung base. Small bilateral pleural effusions seen. The appearance on CT is suspicious for significant multifocal pneumonia, though superimposed interstitial edema cannot be excluded. 2. Borderline cardiomegaly.   Electronically Signed   By: Garald Balding M.D.   On: 03/29/2013 01:31   Dg Chest 2 View  03/27/2013   CLINICAL DATA:  Cough, hemoptysis  EXAM: CHEST  2 VIEW  COMPARISON:  03/23/2013  FINDINGS: Multifocal patchy airspace opacities, most prominent in the right perihilar region, mildly increased. Differential considerations include mild to moderate interstitial edema or possibly multifocal infection.  Suspected small left pleural effusion.  No pneumothorax.  Cardiomegaly.  Surgical clips overlying the bilateral neck.  IMPRESSION: Multifocal patchy airspace opacities, most prominent in the right perihilar region, mildly increased. Differential considerations include mild to moderate interstitial edema or possibly multifocal infection.  Cardiomegaly with suspected small left pleural effusion.   Electronically Signed   By: Julian Hy M.D.   On: 03/27/2013 13:00   Dg Chest 2 View  03/23/2013   CLINICAL DATA:  Chest pain with history of congestive heart failure  EXAM: CHEST  2 VIEW  COMPARISON:  03/20/2013  FINDINGS: Heart size upper normal. Mild vascular congestion has developed when  compared to the prior study. There is bilateral perihilar peribronchial cuffing with indistinct bilateral perihilar vessels and mild hazy opacity in the perihilar regions bilaterally. There are no pleural effusions. Surgical clips in the thyroid bed again identified.  IMPRESSION: Findings suggest development of congestive heart failure with mild pulmonary edema.   Electronically Signed   By: Skipper Cliche M.D.   On: 03/23/2013 16:22   Dg Chest 2 View  03/20/2013   CLINICAL DATA:  Chest and back pain.  Hypertension.  EXAM: CHEST  2 VIEW  COMPARISON:  08/19/2011  FINDINGS: The lungs are clear without focal infiltrate, edema, pneumothorax or pleural effusion. Cardiopericardial silhouette is at upper limits of normal for size. Imaged bony structures of the thorax are intact. Surgical clips are seen in the thyroid bed and left axilla.  IMPRESSION: Stable.  No acute cardiopulmonary process.   Electronically Signed   By: Misty Stanley M.D.   On: 03/20/2013 07:45   Ct Chest Wo Contrast  03/27/2013   CLINICAL DATA:  Chest pain and shortness of breath for several  months. Low-grade fever. Acute renal failure in transplant patient.  EXAM: CT CHEST WITHOUT CONTRAST  TECHNIQUE: Multidetector CT imaging of the chest was performed following the standard protocol without IV contrast.  COMPARISON:  Chest radiograph performed earlier today at 11:45 a.m.  FINDINGS: Diffuse fluffy bilateral central airspace opacity is seen, most prominent at the upper lung lobes bilaterally. This is most compatible with diffuse pneumonia. Underlying edema cannot be excluded, given underlying small bilateral pleural effusions and interstitial prominence. No pneumothorax is seen.  Scattered coronary artery calcifications are seen. The heart is mildly enlarged. A mildly prominent right paratracheal node is seen, measuring 1.3 cm in short axis. No pericardial effusion is identified. The great vessels are grossly unremarkable in appearance. The  patient is status post thyroidectomy. No axillary lymphadenopathy is seen.  The visualized portions of the liver and the spleen are unremarkable in appearance. Severe bilateral renal atrophy is seen, with scattered calcifications noted bilaterally. A small hiatal hernia is noted.  IMPRESSION: 1. Diffuse fluffy bilateral central airspace opacification, most prominent at the upper lung lobes. This is most compatible with diffuse multifocal pneumonia. Underlying edema cannot be excluded, given small bilateral pleural effusions and interstitial prominence. 2. Scattered coronary calcifications seen. 3. Mild cardiomegaly. 4. Mildly prominent right paratracheal node, measuring 1.3 cm in short axis. 5. Severe bilateral renal atrophy, with scattered associated calcifications. 6. Small hiatal hernia seen.   Electronically Signed   By: Garald Balding M.D.   On: 03/27/2013 21:48   Nm Bone Scan Whole Body  03/03/2013   CLINICAL DATA:  Pain.  Prostate cancer.  EXAM: NUCLEAR MEDICINE WHOLE BODY BONE SCAN  TECHNIQUE: Whole body anterior and posterior images were obtained approximately 3 hours after intravenous injection of radiopharmaceutical.  COMPARISON:  Bone scan 10/26/2007.  CT 02/28/2013.  RADIOPHARMACEUTICALS:  26.0 mCi Technetium-99 MDP  FINDINGS: Bilateral renal atrophy with a transplanted kidney right pelvis. No focal bony abnormality. No evidence of bony metastatic disease.  IMPRESSION: No evidence of bony metastatic disease or focal bony abnormality. Bilateral renal atrophy. Transplanted kidney right pelvis.   Electronically Signed   By: Marcello Moores  Register   On: 03/03/2013 14:02   US Renal  03/22/2013   CLINICAL DATA:  Acute kidney injury. Patient with bilateral renal transplant.  EXAM: RENAL/URINARY TRACT ULTRASOUND COMPLETE  COMPARISON:  CT, 02/28/2013  FINDINGS: Right Kidney:  Length: 7.4 cm. Kidney is echogenic. 15 mm cyst arises from the upper pole. There are probable additional tiny cysts. No hydronephrosis.   Left Kidney:  Native left kidney not visualized.  Transplant kidney: In the right lower quadrant to right upper pelvis, a transplant kidney is visualized. Is normal in overall size measuring 10.7 cm. It has normal parenchymal echogenicity. Small cyst arises from its lower pole measuring 1 cm. No other renal masses. No hydronephrosis.  Bladder:  Appears normal for degree of bladder distention.  IMPRESSION: 1. Transplant kidney is normal in appearance within a small lower pole cyst measuring 1 cm. 2. Atrophic native kidneys. Only the right was visualized. Is echogenic with at least 1 discrete small cysts. No hydronephrosis.   Electronically Signed   By: Lajean Manes M.D.   On: 03/22/2013 15:50   Nm Myocar Multi W/spect W/wall Motion / Ef  03/22/2013   CLINICAL DATA:  63yo AA man with PMH of HTN, DM2, CAD with stents, CAD, PVD s/p fem pop, prostate cancer treated with radiation, hx of thyroid cancer, h/o ESRD and kidney transplantation who presents for chest pain and  back pain  EXAM: MYOCARDIAL IMAGING WITH SPECT (REST)  TECHNIQUE: Standard myocardial SPECT imaging was performed after resting intravenous injection of Tc-18m sestamibi. Quantitative gated imaging was also performed to evaluate left ventricular wall motion, and estimate left ventricular ejection fraction. As the patient's heart rate was 129 bpm when he presented for his "stress" images, no additional administration of Lexiscan was performed.  COMPARISON:  None.  FINDINGS: The Image quality is good. There is mild interference from visceral racer uptake. The left ventricle is moderately to severely dilated. There is a moderate in size, moderate-to-severe in intensity inferior and inferolateral perfusion abnormality seen on both rest and "stress" images, possibly worse on the "stress" images. There is global hypokinesis of the left ventricle, with severe basal and mid inferior wall hypokinesis. EF 34%. The severity of LV dysfunction is out of proportion  to the perfusion abnormality.  IMPRESSION: Interpretation of the study is severely limited by absence of a clear increase in coronary flow during acquisition of the "stress" images  There is evidence of severe ischemia at rest and/or scar in the right coronary artery distribution. This appears to be at least partly reversible.  The degree of LV dysfunction suggests multivessel CAD versus a component of superimposed nonischemic cardiomyopathy.  No previous studies are available for comparison   Electronically Signed   By: Sanda Klein   On: 03/22/2013 19:29   Nm Pulmonary Perf And Vent  03/20/2013   CLINICAL DATA:  Chest pain  EXAM: NUCLEAR MEDICINE VENTILATION - PERFUSION LUNG SCAN  Views: Anterior, posterior, left lateral, right lateral, RPO, LPO, RAO, LAO -ventilation and perfusion  Radionuclide: Technetium 89m DTPA -ventilation; Technetium 64m macroaggregated albumin-perfusion  Dose:  40.0 mCi-ventilation; 6.0 mCi-perfusion  Route of administration: Inhalation -ventilation; intravenous -perfusion  COMPARISON:  Chest radiograph March 20, 2013  FINDINGS: Radiotracer uptake on the ventilation study is homogeneous and symmetric bilaterally.  Radiotracer uptake on the perfusion study is homogeneous and symmetric bilaterally.  There is no appreciable ventilation/perfusion mismatch.  IMPRESSION: No ventilation or perfusion abnormalities. Very low probability of pulmonary embolus.   Electronically Signed   By: Lowella Grip M.D.   On: 03/20/2013 16:15     Assessment and Plan  63 year old male CAD, history of renal transplant on immunosuppressants presented with CP and abnormal nuc treated with medical therapy. Also has a/c dCHF, mild AS, DM, HTN, with interval development of paroxysmal atrial tach, ARF/CKD in setting of possible sirolimus toxicity, hyperkalemia tx with kayexalate, fever, bilateral airspace disease of questionable etiology, hemoptysis and hematochezia and severe anemia.   1. CAD s/p  prior stenting, admitted with unstable angina - nuc 1/14 with inferior scar with mild peri-infarct ischemia, the plan is to intensify medical therapy as he is s/p kidney transplant and GFR low. VQ negative for PE. Continue ASA only if OK with primary team, may DC if hemoglobin continues to be an issue. Continue statin, BB, Imdur. P2Y12 is 365 indicating Plavix nonresponder - Plavix discontinued by IM due to bloody stools and hemoptysis. Would not change to Effient in future due to possible hx CVA. Has ruled out twice this admission but has had a few bouts of intermittent CP. Consider increase in Imdur. 2. Paroxysmal atrial tachycardia - cardizem initially discontinued due to interaction with sirolimus, changed to metoprolol - recurrence of PAT despite titration. Diltiazem restarted at lower dose yesterday after discussion with renal and decreased sirolimus dose. No PAT since 1am. 3. HCAP vs other infection in setting of immunosuppression- development of  fever, hemoptysis and bilateral infiltrates vs alveolar hemorhage this admission. Pulm also following. Ucx neg, resp cx normal flora, blood cx neg thus far. Aspergillus pending, PCP/fungal/AFB still not collected - have asked nursing to look into this. Still with low grade tem and persistent infiltrates on CXR - further per IM. 4. GI bleed - low volume hematochezia, could be contributing to anemia. Plavix dc'd. For colonoscopy/endoscopy today if passes prep. 5. Acute on chronic microcytic anemia, felt multifactorial - nadir 5.7 on 1/20 s/p transfusion. Occurring in setting of GIB and hemoptysis.  6. Acute on chronic kidney disease s/p kidney transplant - thought possibly due to sirolimus toxicity. Continues to improve slightly. 7. History of prostate CA with markedly elevated PSA, elevated alk phos - per ID notes, the patient recently had scan indicating bony spine mets. I am unable to locate this in the chart. IM spoke with patient's uro-oncologist who does  not have any further recommendations for work up at this time.  8. Acute on chronic diastolic CHF - LV dysfunction with EF 45-50% by echo this admission. Euvolemic. 9. NSVT - continue BB. Keep K=/>4.0.  10. Mild-mod AS by echo - not clinically significant at this point. 11. Hypothyroidism, status post papillary thyroid cancer removal - TSH elevated but free T4 low normal.  12. DM, uncontrolled - A1C 7.8 - Lantus/SSI.   Signed, Melina Copa PA-C  Personally seen and examined. Agree with above.  With low dose diltiazem doing better.  I am fine with DC from cardiac perspective.  Will add Imdur $RemoveB'30mg'UbJqRhJe$ .   Candee Furbish, MD

## 2013-03-31 NOTE — Op Note (Addendum)
[facility] [place], [id number]   COLONOSCOPY PROCEDURE REPORT  PATIENT: Joshua, [name]  MR#: [id number] BIRTHDATE: 11-30-1950 , 62  yrs. old GENDER: Male ENDOSCOPIST: Lafayette Dragon, MD REFERRED BY:  Jay Schlichter, M.D. PROCEDURE DATE:  03/31/2013 PROCEDURE:   Colonoscopy, incomplete ASA CLASS:   Class III INDICATIONS:heme-positive anemia low-volume hematochezia. MEDICATIONS: These medications were titrated to patient response per physician's verbal order, Versed 3 mg IV, Benadryl 25 mg IV, and Morphine 4 mg IV  DESCRIPTION OF PROCEDURE:   After the risks and benefits and of the procedure were explained, informed consent was obtained.  A digital rectal exam revealed no abnormalities of the rectum.    The Pentax Ped Colon [id number]  endoscope was introduced through the anus and advanced to the hepatic flexure .  The quality of the prep was good, using MoviPrep .  The instrument was then slowly withdrawn as the colon was fully examined.     COLON FINDINGS: The colonic mucosa appeared normal in the descending colon, transverse colon, and at the hepatic flexure. we were unable to advance into the cecum due to large ventral hernia area in spite of turning the patient in the left and right decubital position, scope was coiling in a large ventral hernia. Unable to reach cecum .     The scope was then withdrawn from the patient and the procedure terminated..Retroflection of the endoscope in the rectum showed small internal hemorrhoids  COMPLICATIONS: There were no complications. ENDOSCOPIC IMPRESSION: The colonic mucosa appeared normal in the descending colon, transverse colon, and at the hepatic flexure , incomplete colonoscopy to hepatic flexure due to large ventral Hernia cecum not visualized. Small internal hemorrhoids   RECOMMENDATIONS:  we will proceed with Barium enema tomorrow morning  to  visualize right colon and the  cecum Continue  clear liquids tonight  REPEAT EXAM: consider virtual colonoscopy in the future  cc:  _______________________________ eSigned:  Lafayette Dragon, MD 03/31/2013 5:01 PM

## 2013-03-31 NOTE — Progress Notes (Signed)
Patient blood sugar is 63. Patient is on NPO and no standing order for hypoglycemia. Paged MD and made aware. Will await for orders.

## 2013-03-31 NOTE — Progress Notes (Signed)
Occupational Therapy Treatment Patient Details Name: Joshua [name] MRN: [id number] DOB: 03/30/1950 Today's Date: 03/31/2013 Time: 0630-1601 OT Time Calculation (min): 24 min  OT Assessment / Plan / Recommendation  History of present illness 63 y.o. male admitted to [facility] on 03/20/13 with PMH of HTN, DM2, CAD with stents, CAD, PVD s/p fem pop, prostate cancer treated with radiation, hx of thyroid cancer, h/o ESRD and kidney transplantation who presents for chest pain and back pain.  The chest pain start 3 days ago at rest, it is left sided, substernal, 8/10 at the start, throbbing and sharp, non radiating, possibly worse with breathing, however, definitely worse with palpation.  The pain happened every few hours.  He was intermittently tachycardic in the ED ranging from the 80s-140.  Cardiology was consulted in the ED.  Patient further reports back pain which has been slowly worsening over past few months.  He has had work up in the outpatient setting with a bone scan and MRI lumbar spine as there was concern for bony metastasis of his prostate cancer.  Found to have HCAP, GIB with acute blood loss s/p blood transfusion, acute on chronic kidney failure, back pain with possible bone mets.     OT comments  Pt. Was able to perform ADLs sitting EOB with Min Guard A for standing tasks. Pt. Was Min Guard A with AMB into batnhroom. Pt. Ed. On increaseing safety with ADLs and mobility. Pt. Would benefit from further OT to maximize OT goals.   Follow Up Recommendations       Barriers to Discharge       Equipment Recommendations       Recommendations for Other Services    Frequency     Progress towards OT Goals Progress towards OT goals: Progressing toward goals  Plan      Precautions / Restrictions Precautions Precautions: Fall Restrictions Weight Bearing Restrictions: No   Pertinent Vitals/Pain No c/o    ADL  Grooming: Wash/dry hands;Performed;Supervision/safety Where Assessed - Grooming:  Unsupported standing Lower Body Dressing: Supervision/safety;Performed Where Assessed - Lower Body Dressing: Supported sit to Lobbyist: Magazine features editor Method: Sit to Loss adjuster, chartered: Regular height toilet;Grab bars Toileting - Clothing Manipulation and Hygiene: Performed;Min guard ADL Comments:  (Pt. making steady progress)    OT Diagnosis:    OT Problem List:   OT Treatment Interventions:     OT Goals(current goals can now be found in the care plan section)    Visit Information  Last OT Received On: 03/31/13 Assistance Needed: +1 History of Present Illness: 63 y.o. male admitted to [facility] on 03/20/13 with PMH of HTN, DM2, CAD with stents, CAD, PVD s/p fem pop, prostate cancer treated with radiation, hx of thyroid cancer, h/o ESRD and kidney transplantation who presents for chest pain and back pain.  The chest pain start 3 days ago at rest, it is left sided, substernal, 8/10 at the start, throbbing and sharp, non radiating, possibly worse with breathing, however, definitely worse with palpation.  The pain happened every few hours.  He was intermittently tachycardic in the ED ranging from the 80s-140.  Cardiology was consulted in the ED.  Patient further reports back pain which has been slowly worsening over past few months.  He has had work up in the outpatient setting with a bone scan and MRI lumbar spine as there was concern for bony metastasis of his prostate cancer.  Found to have HCAP, GIB with acute blood loss s/p blood transfusion,  acute on chronic kidney failure, back pain with possible bone mets.      Subjective Data      Prior Functioning       Cognition  Cognition Arousal/Alertness: Awake/alert Behavior During Therapy: Anxious;WFL for tasks assessed/performed Overall Cognitive Status: Within Functional Limits for tasks assessed    Mobility  Bed Mobility Overal bed mobility: Modified Independent General bed mobility comments: uses  railing to pull to sitting; requires incr time; has difficulty at times sequencing    Exercises      Balance    End of Session OT - End of Session Activity Tolerance: Patient tolerated treatment well Patient left: in bed;with call bell/phone within reach;with family/visitor present  [place] 03/31/2013, 11:17 AM

## 2013-03-31 NOTE — Progress Notes (Signed)
PT Cancellation Note  Patient Details Name: Joshua [name] MRN: [id number] DOB: 06-May-1950   Cancelled Treatment:    Reason Eval/Treat Not Completed: Patient at procedure or test/unavailable.  Pt had just received enema ( due to possible colonoscopy today) and was in the bathroom and unable to be seen.   Donita Newland LUBECK 03/31/2013, 1:30 PM

## 2013-03-31 NOTE — Progress Notes (Signed)
Dr. Mechele Claude  Aware of latest CBG result =128 . No signs of hypo/ hyperglycemia with orders ton  d50 1 amp

## 2013-03-31 NOTE — Progress Notes (Signed)
  Date: 03/31/2013  Patient name: Joshua [name]  Medical record number: [id number]  Date of birth: [birth year]   This patient has been seen and the plan of care was discussed with the house staff. Please see their note for complete details. I concur with their findings.  [name], MD 03/31/2013, 8:25 AM

## 2013-03-31 NOTE — Progress Notes (Signed)
Patient complained of chest pain; non radiating; with pain scale of 10/10. Vital signs as follows: HR 136; BP 132/69; 02 sat 97% and T 99.3. PRN oxycodone and scheduled Metropolol was given as ordered. MD made aware of the event. No new orders made. Will monitor accordingly.

## 2013-03-31 NOTE — Progress Notes (Signed)
1630Report received from [facility] b,RN endoscopy RN

## 2013-03-31 NOTE — Progress Notes (Signed)
Subjective: [name] has a mild residual cough and is still producing green colored mucous. He subjectively feels as though he is still improving. He denies any more hemoptysis. No more hematochezia. Patient has completed his bowel prep for his colonoscopy this afternoon. Pain is well controlled. Pt had multiple episodes of atrial tachycardia yesterday evening, though did not have any more episodes since approx midnight last night. Patient is satting well on room air now.  Objective: Vital signs in last 24 hours: Filed Vitals:   03/30/13 2040 03/30/13 2347 03/31/13 0500 03/31/13 1051  BP: 142/64 132/69 128/70 141/67  Pulse: 83 136 96 66  Temp: 99.5 F (37.5 C) 99.3 F (37.4 C) 99.8 F (37.7 C)   TempSrc: Oral Oral Oral   Resp: 20  20   Height:      Weight:   181 lb 10.5 oz (82.4 kg)   SpO2: 100% 97% 98% 97%  on room air  Weight change: 1 lb 8.7 oz (0.7 kg)  Intake/Output Summary (Last 24 hours) at 03/31/13 1205 Last data filed at 03/31/13 0700  Gross per 24 hour  Intake    120 ml  Output    501 ml  Net   -381 ml   Physical Exam General: lying comfortably in bed HEENT: [place]/AT, vision grossly intact Cardiac: RRR, harsh systolic ejection murmur loudest over R sternal border Pulm: lungs almost completely CTAB, only some intermittent wheezing to left lower lung; normal work of breathing Abd: soft, nontender, nondistended, BS present Ext: warm and well perfused, no pedal edema Neuro: alert and oriented X3, cranial nerves II-XII grossly intact, moves all extremities spontaneously  Lab Results: Basic Metabolic Panel:  Recent Labs Lab 03/30/13 0316 03/31/13 0500  NA 135* 136*  K 4.3 4.3  CL 93* 93*  CO2 25 25  GLUCOSE 102* 52*  BUN 41* 37*  CREATININE 2.22* 2.10*  CALCIUM 7.2* 7.4*   Liver Function Tests:  Recent Labs Lab 03/27/13 1300  AST 45*  ALT 26  ALKPHOS 385*  BILITOT 0.3  PROT 7.5  ALBUMIN 2.1*   CBC:  Recent Labs Lab 03/28/13 0620   03/29/13 0352 03/30/13 0316 03/31/13 0500  WBC 11.0*  --  10.2 9.6 9.8  NEUTROABS 8.7*  --  7.7  --   --   HGB 5.7*  < > 7.2* 7.1* 8.0*  HCT 18.1*  < > 22.5* 23.3* 25.7*  MCV 73.0*  --  73.5* 75.9* 75.6*  PLT 363  --  318 339 373  < > = values in this interval not displayed. Cardiac Enzymes:  Recent Labs Lab 03/27/13 0930 03/27/13 1615 03/27/13 2310  CKTOTAL 99  --   --   CKMB 1.2  --   --   TROPONINI <0.30 <0.30 <0.30   CBG:  Recent Labs Lab 03/30/13 1644 03/30/13 2057 03/31/13 0623 03/31/13 0818 03/31/13 0928 03/31/13 1119  GLUCAP 180* 115* 63* 143* 128* 107*   Iron/TIBC/Ferritin    Component Value Date/Time   IRON 17* 03/27/2013 0930   TIBC 166* 03/27/2013 0930   FERRITIN 5064* 03/27/2013 0930   BNP    Component Value Date/Time   PROBNP 19450.0* 03/27/2013 1615   Urinalysis    Component Value Date/Time   COLORURINE YELLOW 03/27/2013 1834   APPEARANCEUR CLOUDY* 03/27/2013 1834   LABSPEC 1.027 03/27/2013 1834   PHURINE 5.0 03/27/2013 1834   GLUCOSEU NEGATIVE 03/27/2013 1834   HGBUR SMALL* 03/27/2013 1834   BILIRUBINUR SMALL* 03/27/2013 1834   Benjamin Stain  NEGATIVE 03/27/2013 1834   PROTEINUR 100* 03/27/2013 1834   UROBILINOGEN 1.0 03/27/2013 1834   NITRITE NEGATIVE 03/27/2013 1834   LEUKOCYTESUR NEGATIVE 03/27/2013 1834   Micro Results: Recent Results (from the past 240 hour(s))  CULTURE, BLOOD (ROUTINE X 2)     Status: None   Collection Time    03/27/13  5:25 PM      Result Value Range Status   Specimen Description BLOOD RIGHT HAND   Final   Special Requests BOTTLES DRAWN AEROBIC ONLY 10CC   Final   Culture  Setup Time     Final   Value: 03/27/2013 22:06     Performed at [facility]   Culture     Final   Value:        BLOOD CULTURE RECEIVED NO GROWTH TO DATE CULTURE WILL BE HELD FOR 5 DAYS BEFORE ISSUING A FINAL NEGATIVE REPORT     Performed at [facility]   Report Status PENDING   Incomplete  CULTURE, BLOOD (ROUTINE X 2)     Status: None    Collection Time    03/27/13  5:28 PM      Result Value Range Status   Specimen Description BLOOD RIGHT HAND   Final   Special Requests BOTTLES DRAWN AEROBIC ONLY 10CC   Final   Culture  Setup Time     Final   Value: 03/27/2013 22:05     Performed at [facility]   Culture     Final   Value:        BLOOD CULTURE RECEIVED NO GROWTH TO DATE CULTURE WILL BE HELD FOR 5 DAYS BEFORE ISSUING A FINAL NEGATIVE REPORT     Performed at [facility]   Report Status PENDING   Incomplete  RESPIRATORY VIRUS PANEL     Status: None   Collection Time    03/27/13  6:14 PM      Result Value Range Status   Source - RVPAN NASAL SWAB   Corrected   Comment: CORRECTED ON 01/20 AT 1804: PREVIOUSLY REPORTED AS NASAL SWAB   Respiratory Syncytial Virus A NOT DETECTED   Final   Respiratory Syncytial Virus B NOT DETECTED   Final   Influenza A NOT DETECTED   Final   Influenza B NOT DETECTED   Final   Parainfluenza 1 NOT DETECTED   Final   Parainfluenza 2 NOT DETECTED   Final   Parainfluenza 3 NOT DETECTED   Final   Metapneumovirus NOT DETECTED   Final   Rhinovirus NOT DETECTED   Final   Adenovirus NOT DETECTED   Final   Influenza A H1 NOT DETECTED   Final   Influenza A H3 NOT DETECTED   Final   Comment: (NOTE)           Normal Reference Range for each Analyte: NOT DETECTED     Testing performed using the Luminex xTAG Respiratory Viral Panel test     kit.     This test was developed and its performance characteristics determined     by [facility]. It has not been cleared or approved by the [place]     Food and Drug Administration. This test is used for clinical purposes.     It should not be regarded as investigational or for research. This     laboratory is certified under the [place] (CLIA) as qualified to perform high complexity  clinical laboratory testing.     Performed at [place], EXPECTORATED  SPUTUM-ASSESSMENT     Status: None   Collection Time    03/27/13  6:34 PM      Result Value Range Status   Specimen Description SPUTUM   Final   Special Requests NONE   Final   Sputum evaluation     Final   Value: THIS SPECIMEN IS ACCEPTABLE. RESPIRATORY CULTURE REPORT TO FOLLOW.   Report Status 03/27/2013 FINAL   Final  URINE CULTURE     Status: None   Collection Time    03/27/13  6:34 PM      Result Value Range Status   Specimen Description URINE, CLEAN CATCH   Final   Special Requests NONE   Final   Culture  Setup Time     Final   Value: 03/28/2013 01:05     Performed at [place]     Final   Value: NO GROWTH     Performed at [facility]   Culture     Final   Value: NO GROWTH     Performed at [facility]   Report Status 03/28/2013 FINAL   Final  CULTURE, RESPIRATORY (NON-EXPECTORATED)     Status: None   Collection Time    03/27/13  6:34 PM      Result Value Range Status   Specimen Description SPUTUM   Final   Special Requests NONE   Final   Gram Stain     Final   Value: RARE WBC PRESENT,BOTH PMN AND MONONUCLEAR     NO SQUAMOUS EPITHELIAL CELLS SEEN     RARE GRAM POSITIVE COCCI IN PAIRS     RARE GRAM POSITIVE RODS     Performed at [facility]   Culture     Final   Value: NORMAL OROPHARYNGEAL FLORA     Performed at [facility]   Report Status 03/30/2013 FINAL   Final  MRSA PCR SCREENING     Status: None   Collection Time    03/28/13 10:42 PM      Result Value Range Status   MRSA by PCR NEGATIVE  NEGATIVE Final   Comment:            The GeneXpert MRSA Assay (FDA     approved for NASAL specimens     only), is one component of a     comprehensive MRSA colonization     surveillance program. It is not     intended to diagnose MRSA     infection nor to guide or     monitor treatment for     MRSA infections.    MISC: sirolimus level 23.9 (high) quantiferon negative PTH 78.5 (H) CMV pcr negative EBV  pcr negative BK virus pcr negative Respiratory virus panel negative Pneumocystis smear pending Aspergillus antigen pending Sputum culture- normal oropharyngeal flora; fungal and AFB culture pending UCX NGTD BCX NGTD x 2 FOBT negative, repeat pending FENa- .2%  Dg Chest 2 View  03/27/2013   CLINICAL DATA:  Cough, hemoptysis  EXAM: CHEST  2 VIEW  COMPARISON:  03/23/2013  FINDINGS: Multifocal patchy airspace opacities, most prominent in the right perihilar region, mildly increased. Differential considerations include mild to moderate interstitial edema or possibly multifocal infection.  Suspected small left pleural effusion.  No pneumothorax.  Cardiomegaly.  Surgical clips overlying the bilateral neck.  IMPRESSION: Multifocal patchy airspace opacities, most  prominent in the right perihilar region, mildly increased. Differential considerations include mild to moderate interstitial edema or possibly multifocal infection.  Cardiomegaly with suspected small left pleural effusion.   Electronically Signed   By: Julian Hy M.D.   On: 03/27/2013 13:00   Dg Chest 2 View  03/23/2013   CLINICAL DATA:  Chest pain with history of congestive heart failure  EXAM: CHEST  2 VIEW  COMPARISON:  03/20/2013  FINDINGS: Heart size upper normal. Mild vascular congestion has developed when compared to the prior study. There is bilateral perihilar peribronchial cuffing with indistinct bilateral perihilar vessels and mild hazy opacity in the perihilar regions bilaterally. There are no pleural effusions. Surgical clips in the thyroid bed again identified.  IMPRESSION: Findings suggest development of congestive heart failure with mild pulmonary edema.   Electronically Signed   By: Skipper Cliche M.D.   On: 03/23/2013 16:22   Dg Chest 2 View  03/20/2013   CLINICAL DATA:  Chest and back pain.  Hypertension.  EXAM: CHEST  2 VIEW  COMPARISON:  08/19/2011  FINDINGS: The lungs are clear without focal infiltrate, edema,  pneumothorax or pleural effusion. Cardiopericardial silhouette is at upper limits of normal for size. Imaged bony structures of the thorax are intact. Surgical clips are seen in the thyroid bed and left axilla.  IMPRESSION: Stable.  No acute cardiopulmonary process.   Electronically Signed   By: Misty Stanley M.D.   On: 03/20/2013 07:45   Ct Chest Wo Contrast  03/27/2013   CLINICAL DATA:  Chest pain and shortness of breath for several months. Low-grade fever. Acute renal failure in transplant patient.  EXAM: CT CHEST WITHOUT CONTRAST  TECHNIQUE: Multidetector CT imaging of the chest was performed following the standard protocol without IV contrast.  COMPARISON:  Chest radiograph performed earlier today at 11:45 a.m.  FINDINGS: Diffuse fluffy bilateral central airspace opacity is seen, most prominent at the upper lung lobes bilaterally. This is most compatible with diffuse pneumonia. Underlying edema cannot be excluded, given underlying small bilateral pleural effusions and interstitial prominence. No pneumothorax is seen.  Scattered coronary artery calcifications are seen. The heart is mildly enlarged. A mildly prominent right paratracheal node is seen, measuring 1.3 cm in short axis. No pericardial effusion is identified. The great vessels are grossly unremarkable in appearance. The patient is status post thyroidectomy. No axillary lymphadenopathy is seen.  The visualized portions of the liver and the spleen are unremarkable in appearance. Severe bilateral renal atrophy is seen, with scattered calcifications noted bilaterally. A small hiatal hernia is noted.  IMPRESSION: 1. Diffuse fluffy bilateral central airspace opacification, most prominent at the upper lung lobes. This is most compatible with diffuse multifocal pneumonia. Underlying edema cannot be excluded, given small bilateral pleural effusions and interstitial prominence. 2. Scattered coronary calcifications seen. 3. Mild cardiomegaly. 4. Mildly  prominent right paratracheal node, measuring 1.3 cm in short axis. 5. Severe bilateral renal atrophy, with scattered associated calcifications. 6. Small hiatal hernia seen.   Electronically Signed   By: Garald Balding M.D.   On: 03/27/2013 21:48   Nm Bone Scan Whole Body  03/03/2013   CLINICAL DATA:  Pain.  Prostate cancer.  EXAM: NUCLEAR MEDICINE WHOLE BODY BONE SCAN  TECHNIQUE: Whole body anterior and posterior images were obtained approximately 3 hours after intravenous injection of radiopharmaceutical.  COMPARISON:  Bone scan 10/26/2007.  CT 02/28/2013.  RADIOPHARMACEUTICALS:  26.0 mCi Technetium-99 MDP  FINDINGS: Bilateral renal atrophy with a transplanted kidney right pelvis. No focal  bony abnormality. No evidence of bony metastatic disease.  IMPRESSION: No evidence of bony metastatic disease or focal bony abnormality. Bilateral renal atrophy. Transplanted kidney right pelvis.   Electronically Signed   By: Maisie Fus  Register   On: 03/03/2013 14:02   US Renal  03/22/2013   CLINICAL DATA:  Acute kidney injury. Patient with bilateral renal transplant.  EXAM: RENAL/URINARY TRACT ULTRASOUND COMPLETE  COMPARISON:  CT, 02/28/2013  FINDINGS: Right Kidney:  Length: 7.4 cm. Kidney is echogenic. 15 mm cyst arises from the upper pole. There are probable additional tiny cysts. No hydronephrosis.  Left Kidney:  Native left kidney not visualized.  Transplant kidney: In the right lower quadrant to right upper pelvis, a transplant kidney is visualized. Is normal in overall size measuring 10.7 cm. It has normal parenchymal echogenicity. Small cyst arises from its lower pole measuring 1 cm. No other renal masses. No hydronephrosis.  Bladder:  Appears normal for degree of bladder distention.  IMPRESSION: 1. Transplant kidney is normal in appearance within a small lower pole cyst measuring 1 cm. 2. Atrophic native kidneys. Only the right was visualized. Is echogenic with at least 1 discrete small cysts. No hydronephrosis.    Electronically Signed   By: Amie Portland M.D.   On: 03/22/2013 15:50   Nm Myocar Multi W/spect W/wall Motion / Ef  03/22/2013   CLINICAL DATA:  63yo AA man with PMH of HTN, DM2, CAD with stents, CAD, PVD s/p fem pop, prostate cancer treated with radiation, hx of thyroid cancer, h/o ESRD and kidney transplantation who presents for chest pain and back pain  EXAM: MYOCARDIAL IMAGING WITH SPECT (REST)  TECHNIQUE: Standard myocardial SPECT imaging was performed after resting intravenous injection of Tc-33m sestamibi. Quantitative gated imaging was also performed to evaluate left ventricular wall motion, and estimate left ventricular ejection fraction. As the patient's heart rate was 129 bpm when he presented for his "stress" images, no additional administration of Lexiscan was performed.  COMPARISON:  None.  FINDINGS: The Image quality is good. There is mild interference from visceral racer uptake. The left ventricle is moderately to severely dilated. There is a moderate in size, moderate-to-severe in intensity inferior and inferolateral perfusion abnormality seen on both rest and "stress" images, possibly worse on the "stress" images. There is global hypokinesis of the left ventricle, with severe basal and mid inferior wall hypokinesis. EF 34%. The severity of LV dysfunction is out of proportion to the perfusion abnormality.  IMPRESSION: Interpretation of the study is severely limited by absence of a clear increase in coronary flow during acquisition of the "stress" images  There is evidence of severe ischemia at rest and/or scar in the right coronary artery distribution. This appears to be at least partly reversible.  The degree of LV dysfunction suggests multivessel CAD versus a component of superimposed nonischemic cardiomyopathy.  No previous studies are available for comparison   Electronically Signed   By: Thurmon Fair   On: 03/22/2013 19:29   Nm Pulmonary Perf And Vent  03/20/2013   CLINICAL DATA:   Chest pain  EXAM: NUCLEAR MEDICINE VENTILATION - PERFUSION LUNG SCAN  Views: Anterior, posterior, left lateral, right lateral, RPO, LPO, RAO, LAO -ventilation and perfusion  Radionuclide: Technetium 44m DTPA -ventilation; Technetium 44m macroaggregated albumin-perfusion  Dose:  40.0 mCi-ventilation; 6.0 mCi-perfusion  Route of administration: Inhalation -ventilation; intravenous -perfusion  COMPARISON:  Chest radiograph March 20, 2013  FINDINGS: Radiotracer uptake on the ventilation study is homogeneous and symmetric bilaterally.  Radiotracer uptake on  the perfusion study is homogeneous and symmetric bilaterally.  There is no appreciable ventilation/perfusion mismatch.  IMPRESSION: No ventilation or perfusion abnormalities. Very low probability of pulmonary embolus.   Electronically Signed   By: Lowella Grip M.D.   On: 03/20/2013 16:15   2D echo 03/22/2012- Study Conclusions  - Left ventricle: The cavity size was mildly dilated. Wall thickness was normal. Systolic function was mildly reduced. The estimated ejection fraction was in the range of 45% to 50%. There is hypokinesis of the lateral myocardium. Doppler parameters are consistent with restrictive physiology, indicative of decreased left ventricular diastolic compliance and/or increased left atrial pressure. Doppler parameters are consistent with high ventricular filling pressure. - Aortic valve: Valve mobility was restricted. There was mild to moderate stenosis. Trivial regurgitation. Valve area: 1.56cm^2(VTI). Valve area: 1.47cm^2 (Vmax). - Mitral valve: Mild regurgitation. - Left atrium: The atrium was mildly dilated. - Pulmonary arteries: Systolic pressure was mildly increased. PA peak pressure: 17m Hg (S).  Medications: I have reviewed the patient's current medications. Scheduled Meds: . aspirin  81 mg Oral Daily  . atorvastatin  80 mg Oral q1800  . dextrose      . diltiazem  120 mg Oral Daily  . feeding supplement  (ENSURE COMPLETE)  237 mL Oral TID BM  . insulin aspart  0-9 Units Subcutaneous TID WC  . insulin glargine  28 Units Subcutaneous Daily  . isosorbide mononitrate  30 mg Oral Daily  . levofloxacin  750 mg Oral Q48H  . levothyroxine  150 mcg Oral Daily  . methocarbamol  500 mg Oral BID  . metoprolol tartrate  50 mg Oral Q6H  . mycophenolate  720 mg Oral BID  . pantoprazole  40 mg Oral Daily  . regadenoson  0.4 mg Intravenous Once  . sirolimus  2 mg Oral Daily  . sodium bicarbonate  650 mg Oral BID   Continuous Infusions:   PRN Meds:.sodium chloride, oxyCODONE, sodium chloride  Assessment/Plan:  HCAP: Patient is improving with antibiotics. No more hemoptysis. Remains afebrile. No further oxygen requirement. Repeat chest xray shows stable bilateral patchy airspace disease; improvement of R upper lobe, though worse in L upper lobe; effusions unchanged.  -today is day 4 of antibiotics--> d/c IV antibiotics and will start levaquin 7527mPO q48h on 1/23 (total of 8 day course planned) -plan to discharge home tomorrow -ID consult- appreciate recs -Pulmonology consult- no need for bronchoscopy at this time; will need follow up CXR to assess resolution in 4-6 weeks -PT/OT- recommend HHPT/OT and walker, which case manager is working on today  Acute GI bleed w/ dropping hemoglobin on 03/28/2013: No more diarrhea or BRBPR. Hb stable today 8.0.  -GI consult- EGD and colonoscopy today -d/c heparin, ASA, plavix (1/20 first day without any of these medications)  Chest pain with hx of CAD, dCHF and current atrial tachycardia- Patient had one episode of chest pain overnight that was identical to the pain he had been having. Given ACS has been ruled out twice this admission with this same chest pain, night float opted to not cycle cardiac enzymes. Patient's atrial tachycardia has been much more well controlled since midnight last night since patient received low dose diltiazem.  -diltiazem 12082maily  (started 1/22) -continue metoprolol tartrate to 12m51mh -cardiology following, appreciate recs; we are planning for discharge tomorrow if his rate remains well controlled, please let us [place] if dispo plan is different from cardiology standpoint -holding ASA, plavix, heparin in setting of GI bleed (on 1/20-  Hb stable now without further bleeding) -Continue cardiac medications: Nitroglycerin, lipitor, metoprolol, IMDUR, diltiazem  Anion Gap Metabolic acidosis: AG remains elevated, 18 today. Unclear etiology. Starvation ketoacidosis vs. medication side effect vs A/CKD. Sirolimus and mycophenolate can both cause acidosis, though unsure if they specifically can cause an increase in AG. Of note, sirolimus level on 1/19 was elevated at 23.9, which may have contributed to this. 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 Cr a few weeks ago was 1.7-1.8. Possibly related to elevated sirolimus level.  -continue sirolimus to 58m daily given we are continuing diltiazem at low dose for HR control -renal following, appreciate recs  Metastatic Prostate cancer with back pain: Back pain is stable.  -Outpatient uro-oncology work up  HTN: stable -continue IMDUR 32mdaily and metoprolol 5056m6h, diltiazem 120m3mily  DM-II: A1c 7.8 on admission. On Lantus 24 U daily at home. CBGs went low to 60s this morning given lantus was not decreased yesterday evening and he was kept on CLD. I have ordered amps of D50 to maintain CBGs today prior to procedure. He will be given meal tray immediately after his procedure. -continue lantus 28U daily -SSI sensitive   DVT px: SCDs Diet: carb modified Dispo:  planning for discharge tomorrow if he tolerates his EGD/colonoscopy well  The patient does have a current PCP (TracSueanne Margarita) and does not need [facility].  The patient does not have transportation limitations that hinder transportation to clinic appointments.  Services Needed at time of discharge: Y = Yes, Blank = No PT:   OT:   RN:   Equipment:   Other:     LOS: 11 days   RachRebecca Eaton 03/31/2013, 12:05 PM

## 2013-03-31 NOTE — Op Note (Signed)
[facility] [place], [id number]   ENDOSCOPY PROCEDURE REPORT  PATIENT: Joshua [name], Joshua [name]  MR#: [id number] BIRTHDATE: [birth year] , 62  yrs. old GENDER: Male ENDOSCOPIST: Lafayette Dragon, MD REFERRED BY:  Jay Schlichter, M.D. PROCEDURE DATE:  03/31/2013 PROCEDURE:  EGD, diagnostic ASA CLASS:     Class III INDICATIONS:  heme positive for anemia. MEDICATIONS: These medications were titrated to patient response per physician's verbal order, Versed 4 mg IV, and Morphine 4 mg IV TOPICAL ANESTHETIC: Cetacaine Spray  DESCRIPTION OF PROCEDURE: After the risks benefits and alternatives of the procedure were thoroughly explained, informed consent was obtained.  The    endoscope was introduced through the mouth and advanced to the second portion of the duodenum. Without limitations.  The instrument was slowly withdrawn as the mucosa was fully examined.      esophagus: Esophageal mucosa appeared normal in proximal mid and distal esophagus. There was no stricture or esophagitis. Endoscope traversed into the stomach without difficulty Stomach: Gastric mucosa appeared normal in the gastric body and the gastric antrum. There was mild nonspecific erythema in the antrum. Retroflexion of the endoscope revealed normal fundus and cardia. There were no gastric varices Duodenum: Duodenal bulb and descending duodenum was normal[ The scope was then withdrawn from the patient and the procedure completed.  COMPLICATIONS: There were no complications. ENDOSCOPIC IMPRESSION: essentially normal upper endoscopy of esophagus stomach and duodenum. Nothing to account for heme positive stool RECOMMENDATIONS:   proceed with colonoscopy   REPEAT EXAM: no  eSigned:  Lafayette Dragon, MD 03/31/2013 4:55 PM   CC:

## 2013-03-31 NOTE — Progress Notes (Signed)
[facility]    Name: Joshua [name] MRN: [id number] DOB: 08-29-1950    ADMISSION DATE:  03/20/2013 CONSULTATION DATE: 1-20  REFERRING MD :  FPTS  CHIEF COMPLAINT:  Abnormal xray  BRIEF PATIENT DESCRIPTION:  63 yo male never smoker admitted 1/12 with chest pain.  Later developed cough, hemoptysis, and b/l pulmonary infiltrates.  Hx of CAD on plavix, and ESRD s/p renal transplant on chronic immunosuppression.  PCCM consulted to assist with evaluation of hemoptysis and pulmonary infiltrates.  SIGNIFICANT EVENTS: 1/12 Admit with chest pain, cardiology consulted 1/13 Fever 101.7 1/19 Chills/hemoptysis, renal consult, ID consult  1/20 PCCM consulted, plavix held, GI consult for rectal bleed 1/21 Atrial tachycardia >> transfer to SDU 1/22 Hemoptysis resolved  STUDIES:  1/12 V/Q scan >> very low probability for PE 1/12 CXR >> no acute process 1/13 Echo >> EF 45 to 69%, diastolic dysfx, mild/mod AS, mild MR, mild LA dilation, PAS 43 mmHg 1/14 Renal u/s >> normal appearance transplanted kidney 1/14 Nuclear stress test >> Evidence of severe ischemia at rest and/or scar in the Rt coronary artery distribution. This appears to be at least partly reversible. 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  1/19 Quantiferon gold >> Indeterminate   ANTIBIOTICS: 1/23 Levaquin >> 1/20 Vancomycin > 1/22 1/20 Cefepime > 1/22  SUBJECTIVE:  Denies hemoptysis, cough persists but is improving. Subjectively "feels better".  VITAL SIGNS: Temp:  [98.5 F (36.9 C)-99.8 F (37.7 C)] 99.8 F (37.7 C)  (01/23 0500) Pulse Rate:  [78-163] 96 (01/23 0500) Resp:  [20] 20 (01/23 0500) BP: (128-149)/(57-70) 128/70 mmHg (01/23 0500) SpO2:  [94 %-100 %] 98 % (01/23 0500) Weight:  [82.3 kg (181 lb 7 oz)-82.4 kg (181 lb 10.5 oz)] 82.4 kg (181 lb 10.5 oz) (01/23 0500) Room air  INTAKE / OUTPUT: Intake/Output     01/22 0701 - 01/23 0700 01/23 0701 - 01/24 0700   P.O. 600    IV Piggyback 250    Total Intake(mL/kg) 850 (10.3)    Urine (mL/kg/hr) 900 (0.5)    Stool 1 (0)    Total Output 901     Net -51            PHYSICAL EXAMINATION: General:  WNWDAAM, NAD at rest Neuro:  Intact HEENT: No lan/JVD poor dentation Cardiovascular:  s1s2 rrr, 80's Lungs:  resps even non labored,  few scattered rhonchi bilat Abdomen:  Rt kidney transplant noted, +bs Musculoskeletal:  Intact Skin:  Warm, multiple av fistulas with left bicep +bruit thrill  LABS:  CBC  Recent Labs Lab 03/29/13 0352 03/30/13 0316 03/31/13 0500  WBC 10.2 9.6 9.8  HGB 7.2* 7.1* 8.0*  HCT 22.5* 23.3* 25.7*  PLT 318 339 373   BMET  Recent Labs Lab 03/29/13 0352 03/30/13 0316 03/31/13 0500  NA 132* 135* 136*  K 4.0 4.3 4.3  CL 90* 93* 93*  CO2 22 25 25   BUN 46* 41* 37*  CREATININE 2.31* 2.22* 2.10*  GLUCOSE 204* 102* 52*   Electrolytes  Recent Labs Lab 03/29/13 0352 03/30/13 0316  03/31/13 0500  CALCIUM 7.5* 7.2* 7.4*    Sepsis Markers  Recent Labs Lab 03/24/13 1110  LATICACIDVEN 1.1   Liver Enzymes  Recent Labs Lab 03/27/13 1300  AST 45*  ALT 26  ALKPHOS 385*  BILITOT 0.3  ALBUMIN 2.1*   Cardiac Enzymes  Recent Labs Lab 03/27/13 0930 03/27/13 1615 03/27/13 2310  TROPONINI <0.30 <0.30 <0.30  PROBNP  --  19450.0*  --    Glucose  Recent Labs Lab 03/30/13 1153 03/30/13 1644 03/30/13 2057 03/31/13 0623 03/31/13 0818 03/31/13 0928  GLUCAP 184* 180* 115* 63* 143* 128*    Imaging Dg Chest 2 View  03/31/2013   CLINICAL DATA:  Cough and fever.  Follow-up pulmonary  infiltrates.  EXAM: CHEST  2 VIEW  COMPARISON:  03/28/2013  FINDINGS: The cardiac silhouette remains enlarged, unchanged. Patchy bilateral airspace opacities are again seen, greatest in the upper lobes. Left lower lobe opacity has slightly increased from the prior study. Opacities elsewhere appear stable to minimally improved. Small bilateral pleural effusions are unchanged. No pneumothorax is identified. Surgical clips are again seen in the lower neck and axillae. No acute osseous abnormality is seen.  IMPRESSION: Patchy bilateral airspace opacities concerning for pneumonia, stable to minimally improved in the upper lobes and slightly worsened in the left lower lobe. Unchanged effusions.   Electronically Signed   By: Logan Bores   On: 03/31/2013 08:51    ASSESSMENT:  63 yo male with intermittent fever, b/l upper lobe predominant pulmonary infiltrates since 1/15, and hemoptysis.  He has hx of renal transplant on chronic immunosuppression, CAD on plavix, and metastatic prostate cancer.  Concern is for infection versus alveolar hemorrhage.  Clinically improving with Abx therapy.  PLAN: -continue Abx per ID -no indication for bronchoscopy at this time -can f/u CXR as outpt in few weeks   HOFFMAN, Silvestre Moment, NP 03/31/2013  10:02 AM Pager: [phone] or [phone]  Reviewed above, examined pt, and agree with assessment/plan.  Clinically improving.  Defer bronchoscopy.  Continue Abx per primary team and ID.  F/u CXR with primary care as outpt in few weeks.  PCCM will sign off.  Please call if additional help needed.  Chesley Mires, MD [facility] Pulmonary/Critical Care 03/31/2013, 10:02 AM Pager:  [phone] After 3pm call: [phone]

## 2013-03-31 NOTE — Progress Notes (Signed)
Daily Rounding Note  03/31/2013, 9:08 AM  LOS: 11 days   SUBJECTIVE:       Has about 12 oz of second portion of Moviprep yet to drink.  Stool not yet clear.  Feels full. No nausea  OBJECTIVE:         Vital signs in last 24 hours:    Temp:  [98.5 F (36.9 C)-99.8 F (37.7 C)] 99.8 F (37.7 C) (01/23 0500) Pulse Rate:  [78-163] 96 (01/23 0500) Resp:  [20] 20 (01/23 0500) BP: (128-149)/(57-70) 128/70 mmHg (01/23 0500) SpO2:  [94 %-100 %] 98 % (01/23 0500) Weight:  [82.3 kg (181 lb 7 oz)-82.4 kg (181 lb 10.5 oz)] 82.4 kg (181 lb 10.5 oz) (01/23 0500) Last BM Date: 03/27/13 General: looks generally frail.  Not acutely ill   Heart: sinus rhythm.  Chest: no resp distress GI: Stool in commode is still soft, not near being clear.   Neuro/Psych:  Alert, oriented x 3.   Intake/Output from previous day: 01/22 0701 - 01/23 0700 In: 850 [P.O.:600; IV Piggyback:250] Out: 901 [Urine:900; Stool:1]  Intake/Output this shift:    Lab Results:  Recent Labs  03/29/13 0352 03/30/13 0316 03/31/13 0500  WBC 10.2 9.6 9.8  HGB 7.2* 7.1* 8.0*  HCT 22.5* 23.3* 25.7*  PLT 318 339 373   BMET  Recent Labs  03/29/13 0352 03/30/13 0316 03/31/13 0500  NA 132* 135* 136*  K 4.0 4.3 4.3  CL 90* 93* 93*  CO2 22 25 25   GLUCOSE 204* 102* 52*  BUN 46* 41* 37*  CREATININE 2.31* 2.22* 2.10*  CALCIUM 7.5* 7.2* 7.4*   LFT No results found for this basename: PROT, ALBUMIN, AST, ALT, ALKPHOS, BILITOT, BILIDIR, IBILI,  in the last 72 hours PT/INR No results found for this basename: LABPROT, INR,  in the last 72 hours Hepatitis Panel No results found for this basename: HEPBSAG, HCVAB, HEPAIGM, HEPBIGM,  in the last 72 hours  Studies/Results: Dg Chest 2 View  03/31/2013   CLINICAL DATA:  Cough and fever.  Follow-up pulmonary infiltrates.  EXAM: CHEST  2 VIEW  COMPARISON:  03/28/2013  FINDINGS: The cardiac silhouette remains enlarged,  unchanged. Patchy bilateral airspace opacities are again seen, greatest in the upper lobes. Left lower lobe opacity has slightly increased from the prior study. Opacities elsewhere appear stable to minimally improved. Small bilateral pleural effusions are unchanged. No pneumothorax is identified. Surgical clips are again seen in the lower neck and axillae. No acute osseous abnormality is seen.  IMPRESSION: Patchy bilateral airspace opacities concerning for pneumonia, stable to minimally improved in the upper lobes and slightly worsened in the left lower lobe. Unchanged effusions.   Electronically Signed   By: Logan Bores   On: 03/31/2013 08:51    ASSESMENT:   *  Microcytic anemia, chronic. Drop of nearly 3 grams over 7 days.  One unit PRBCs given 03/28/13.  * Limited rectal bleeding occurred after kayexelate caused copious diarrhea. Resolved.  Amount of rectal bleeding does not seem sufficient to explain the level of anemia.  Rule out hemorrhoidal, rule out rectal/anal irritation from the diarrhea. Rule out radiation proctitis.  Stool not clear * Diffuse multifocal PNA. ID following, immunosuppressed. Day 4/8 abx: cefepime/vanc x 3 days now Levaquin.  ID following.  * CAD. 03/2009 non STEMI: s/p BMS to LAD, chronically occluded RCA. On chronic Plavix, temporarily on hold, last dose 1/19 Note Dr Kingsley Plan statement that pt is "Plavix  nonresponder - Plavix discontinued by IM due to bloody stools and hemoptysis. Would not change to Effient in future due to possible hx CVA"  *  Acute diastolic and systolic heart failure. EF 45 to 50%  *  S/p renal transplant 2001. Chronic immunosuppressives. Addition of Diltiazem 1/14 felt to have led to Sirolimus toxicity and ARF.  Improving renal function.  *  Prostate cancer. Initial diagnoses ~ 2000/2001. S/p radiation. Per pt, Dr Storm Frisk and resident's notes pt has T spine mets. I can not find studies to support this diagnoses of mets. Pt says he is scheduled for MRI  with biopsy today, again I am unable to find this in Epic.  * IDDM.     PLAN   *  Colonoscopy/EGD today.  Wrote order for tap water enema prn at 1030 today and shifted case to 1300.      Joshua [name]  03/31/2013, 9:08 AM Pager: [phone] Attending MD note:   I have taken a history, examined the patient, and reviewed the chart. I agree with the Advanced Practitioner's impression and recommendations.   [facility] Pager # [phone]

## 2013-04-01 DIAGNOSIS — J189 Pneumonia, unspecified organism: Secondary | ICD-10-CM | POA: Diagnosis not present

## 2013-04-01 DIAGNOSIS — D509 Iron deficiency anemia, unspecified: Secondary | ICD-10-CM

## 2013-04-01 LAB — TYPE AND SCREEN
ABO/RH(D): O POS
Antibody Screen: NEGATIVE
UNIT DIVISION: 0
Unit division: 0

## 2013-04-01 LAB — GLUCOSE, CAPILLARY
GLUCOSE-CAPILLARY: 204 mg/dL — AB (ref 70–99)
GLUCOSE-CAPILLARY: 116 mg/dL — AB (ref 70–99)
GLUCOSE-CAPILLARY: 104 mg/dL — AB (ref 70–99)
Glucose-Capillary: 217 mg/dL — ABNORMAL HIGH (ref 70–99)
Glucose-Capillary: 265 mg/dL — ABNORMAL HIGH (ref 70–99)
Glucose-Capillary: 302 mg/dL — ABNORMAL HIGH (ref 70–99)

## 2013-04-01 LAB — RENAL FUNCTION PANEL
Albumin: 2 g/dL — ABNORMAL LOW (ref 3.5–5.2)
BUN: 33 mg/dL — AB (ref 6–23)
CHLORIDE: 90 meq/L — AB (ref 96–112)
CO2: 24 meq/L (ref 19–32)
Calcium: 7.1 mg/dL — ABNORMAL LOW (ref 8.4–10.5)
Creatinine, Ser: 2.18 mg/dL — ABNORMAL HIGH (ref 0.50–1.35)
GFR calc Af Amer: 36 mL/min — ABNORMAL LOW (ref >90)
GFR calc non Af Amer: 31 mL/min — ABNORMAL LOW (ref >90)
GLUCOSE: 211 mg/dL — AB (ref 70–99)
Phosphorus: 3.2 mg/dL (ref 2.3–4.6)
Potassium: 3.9 mEq/L (ref 3.7–5.3)
Sodium: 134 mEq/L — ABNORMAL LOW (ref 137–147)

## 2013-04-01 LAB — CBC
HEMATOCRIT: 26.2 % — AB (ref 39.0–52.0)
Hemoglobin: 8.2 g/dL — ABNORMAL LOW (ref 13.0–17.0)
MCH: 23.2 pg — ABNORMAL LOW (ref 26.0–34.0)
MCHC: 31.3 g/dL (ref 30.0–36.0)
MCV: 74.2 fL — ABNORMAL LOW (ref 78.0–100.0)
PLATELETS: 454 10*3/uL — AB (ref 150–400)
RBC: 3.53 MIL/uL — ABNORMAL LOW (ref 4.22–5.81)
RDW: 19 % — AB (ref 11.5–15.5)
WBC: 8.9 10*3/uL (ref 4.0–10.5)

## 2013-04-01 MED ORDER — DRONEDARONE HCL 400 MG PO TABS
400.0000 mg | ORAL_TABLET | Freq: Two times a day (BID) | ORAL | Status: DC
  Administered 2013-04-01 – 2013-04-03 (×5): 400 mg via ORAL
  Filled 2013-04-01 (×9): qty 1

## 2013-04-01 MED ORDER — POLYETHYLENE GLYCOL 3350 17 GM/SCOOP PO POWD
1.0000 | Freq: Once | ORAL | Status: DC
  Filled 2013-04-01: qty 255

## 2013-04-01 NOTE — Progress Notes (Signed)
Subjective:  PAT 140 since midnight.  No symptoms.  Yesterday did fairly well on diltiazem 120.   Objective:  Vital Signs in the last 24 hours: Temp:  [97.5 F (36.4 C)-99.6 F (37.6 C)] 99.6 F (37.6 C) (01/24 0538) Pulse Rate:  [65-145] 145 (01/24 0538) Resp:  [15-26] 18 (01/24 0538) BP: (101-169)/(46-82) 131/73 mmHg (01/24 0538) SpO2:  [80 %-100 %] 98 % (01/24 0538) Weight:  [159 lb 9.8 oz (72.4 kg)] 159 lb 9.8 oz (72.4 kg) (01/24 0538)  Intake/Output from previous day: 01/23 0701 - 01/24 0700 In: 37 [P.O.:720] Out: 603 [Urine:600; Stool:3]   Physical Exam: General: Well developed, well nourished, in no acute distress. Head:  Normocephalic and atraumatic. Lungs: Rare wheeze, no rhochi normal effort. Heart: Tachy RR.  No murmur, rubs or gallops.  Abdomen: soft, non-tender, positive bowel sounds. Extremities: No clubbing or cyanosis. No edema. Neurologic: Alert and oriented x 3.    Lab Results:  Recent Labs  03/31/13 0500 04/01/13 0432  WBC 9.8 8.9  HGB 8.0* 8.2*  PLT 373 454*    Recent Labs  03/30/13 0316 03/31/13 0500  NA 135* 136*  K 4.3 4.3  CL 93* 93*  CO2 25 25  GLUCOSE 102* 52*  BUN 41* 37*  CREATININE 2.22* 2.10*   Imaging: Dg Chest 2 View  03/31/2013   CLINICAL DATA:  Cough and fever.  Follow-up pulmonary infiltrates.  EXAM: CHEST  2 VIEW  COMPARISON:  03/28/2013  FINDINGS: The cardiac silhouette remains enlarged, unchanged. Patchy bilateral airspace opacities are again seen, greatest in the upper lobes. Left lower lobe opacity has slightly increased from the prior study. Opacities elsewhere appear stable to minimally improved. Small bilateral pleural effusions are unchanged. No pneumothorax is identified. Surgical clips are again seen in the lower neck and axillae. No acute osseous abnormality is seen.  IMPRESSION: Patchy bilateral airspace opacities concerning for pneumonia, stable to minimally improved in the upper lobes and slightly  worsened in the left lower lobe. Unchanged effusions.   Electronically Signed   By: Logan Bores   On: 03/31/2013 08:51    Telemetry: PAT since midnight.  Personally viewed.   Cardiac Studies:  EF 50%  . aspirin  81 mg Oral Daily  . atorvastatin  80 mg Oral q1800  . diltiazem  120 mg Oral Daily  . dronedarone  400 mg Oral BID WC  . feeding supplement (ENSURE COMPLETE)  237 mL Oral TID BM  . insulin aspart  0-9 Units Subcutaneous TID WC  . insulin glargine  28 Units Subcutaneous Daily  . isosorbide mononitrate  30 mg Oral Daily  . levofloxacin  750 mg Oral Q48H  . levothyroxine  150 mcg Oral Daily  . methocarbamol  500 mg Oral BID  . metoprolol tartrate  50 mg Oral Q6H  . mycophenolate  720 mg Oral BID  . pantoprazole  40 mg Oral Daily  . [START ON 04/02/2013] polyethylene glycol powder  1 Container Oral Once  . regadenoson  0.4 mg Intravenous Once  . sirolimus  2 mg Oral Daily  . 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 Also has a/c dCHF, mild AS, DM, HTN, with interval development of paroxysmal atrial tach difficult to control, ARF/CKD in setting of possible sirolimus toxicity, hyperkalemia tx with kayexalate, fever, bilateral airspace disease of questionable etiology, hemoptysis and hematochezia  and severe anemia.   1) PAT - challenging. Diltiazem is not sufficient. Adding Multaq or dronedarone after review of Dr. Olin Pia note. Discussed with Dr. Jimmy Footman of nephology. Hopefully addition of antiarrhythmic will help. Dose adjusted sirolimus. Nephro following levels.   I would not recommend DC until HR better controlled.   Persistent PAT at this heart rate could lead to worsening cardiomyopathy.   2) GI bleed - Hg stable. Holding Plavix.   3) NSVT - no further issue  4) Mild to mod AS - stable, should be of no clinical consequence at this time.   5) CAD - med mgt.    SKAINS, [place] 04/01/2013, 10:48 AM

## 2013-04-01 NOTE — Progress Notes (Signed)
Daily Rounding Note  04/01/2013, 8:50 AM  LOS: 12 days   SUBJECTIVE:       No complaints except hungry and wants to go home  OBJECTIVE:         Vital signs in last 24 hours:    Temp:  [97.5 F (36.4 C)-99.6 F (37.6 C)] 99.6 F (37.6 C) (01/24 0538) Pulse Rate:  [65-145] 145 (01/24 0538) Resp:  [15-26] 18 (01/24 0538) BP: (101-169)/(46-82) 131/73 mmHg (01/24 0538) SpO2:  [80 %-100 %] 98 % (01/24 0538) Weight:  [72.4 kg (159 lb 9.8 oz)] 72.4 kg (159 lb 9.8 oz) (01/24 0538) Last BM Date: 03/27/13 General: chronicallly ill looking.  comfortable Heart: rrr Chest: clear Abdomen: distended with ventral hernia  Extremities: scratches on legs, no edema Neuro/Psych:  Cooperative, oriented.  Intake/Output from previous day: 01/23 0701 - 01/24 0700 In: 720 [P.O.:720] Out: 603 [Urine:600; Stool:3]  Intake/Output this shift:    Lab Results:  Recent Labs  03/30/13 0316 03/31/13 0500 04/01/13 0432  WBC 9.6 9.8 8.9  HGB 7.1* 8.0* 8.2*  HCT 23.3* 25.7* 26.2*  PLT 339 373 454*   BMET  Recent Labs  03/30/13 0316 03/31/13 0500  NA 135* 136*  K 4.3 4.3  CL 93* 93*  CO2 25 25  GLUCOSE 102* 52*  BUN 41* 37*  CREATININE 2.22* 2.10*  CALCIUM 7.2* 7.4*   LFT No results found for this basename: PROT, ALBUMIN, AST, ALT, ALKPHOS, BILITOT, BILIDIR, IBILI,  in the last 72 hours PT/INR No results found for this basename: LABPROT, INR,  in the last 72 hours Hepatitis Panel No results found for this basename: HEPBSAG, HCVAB, HEPAIGM, HEPBIGM,  in the last 72 hours  Studies/Results: Dg Chest 2 View  03/31/2013   CLINICAL DATA:  Cough and fever.  Follow-up pulmonary infiltrates.  EXAM: CHEST  2 VIEW  COMPARISON:  03/28/2013  FINDINGS: The cardiac silhouette remains enlarged, unchanged. Patchy bilateral airspace opacities are again seen, greatest in the upper lobes. Left lower lobe opacity has slightly increased from the  prior study. Opacities elsewhere appear stable to minimally improved. Small bilateral pleural effusions are unchanged. No pneumothorax is identified. Surgical clips are again seen in the lower neck and axillae. No acute osseous abnormality is seen.  IMPRESSION: Patchy bilateral airspace opacities concerning for pneumonia, stable to minimally improved in the upper lobes and slightly worsened in the left lower lobe. Unchanged effusions.   Electronically Signed   By: Logan Bores   On: 03/31/2013 08:51    ASSESMENT:   *  Rectal bleeding in setting of kayexelate induced diarrhea, minor and resolved earlier this week.  anemia colonoscopy 1/23:incomplete to hepatic flexure due to scope coiling in large ventral hernia egd 1/23 normal   PLAN   *  Barium enema ordered for today. Radiology will not do this on weekend, so reordered for Monday.  Will give 1/2 prep Sunday PM.  Start renal diet now. Do not  Plan to see tomorrow, will follow up after BE completed *  If possible continue to hold plavix in case BE shows something that will require surgery.     Azucena Freed  04/01/2013, 8:50 AM Pager: [phone]  Addendum:  1230 Called by resident.  The TS wants to send pt home.  I asked them to send note to Dr Olevia Perches via in basket so that the BE can be arranged as outpt.  However I see that cardiolgist does not  endorse discharge due to [place]  Patient seen and examined. Interval history and data reviewed. Procedures reviewed with Dr. Olevia Perches yesterday. Agree with H&P as outlined above. From a GI standpoint, barium enema can be either inpatient or outpatient. Primary service mother consultants to decide on patient's hospital status. If she is still here on Monday and undergoes barium enema, we will followup. Otherwise outpatient study can be performed.  Docia Chuck. Geri [place]., M.D. [facility] Division of Gastroenterology

## 2013-04-01 NOTE — Progress Notes (Signed)
Internal Medicine Attending  Date: 04/01/2013  Patient name: Joshua [name] Medical record number: [id number] Date of birth: Aug 23, 1950 Age: 63 y.o. Gender: male  I saw and evaluated the patient on AM rounds, and discussed his care with housestaff.  I reviewed the resident's note by Dr. Mechele Claude and I agree with the resident's findings and plans as documented in her note, with the following additional comments.  Patient has been in persisting atrial tachycardia today, with management plans as outlined by cardiology.

## 2013-04-01 NOTE — Progress Notes (Signed)
Subjective: Interval History: has complaints back pain.  Objective: Vital signs in last 24 hours: Temp:  [97.5 F (36.4 C)-99.6 F (37.6 C)] 99.6 F (37.6 C) (01/24 0538) Pulse Rate:  [65-145] 145 (01/24 0538) Resp:  [15-26] 18 (01/24 0538) BP: (101-169)/(46-82) 131/73 mmHg (01/24 0538) SpO2:  [80 %-100 %] 98 % (01/24 0538) Weight:  [72.4 kg (159 lb 9.8 oz)] 72.4 kg (159 lb 9.8 oz) (01/24 0538) Weight change: -9.9 kg (-21 lb 13.2 oz)  Intake/Output from previous day: 01/23 0701 - 01/24 0700 In: 720 [P.O.:720] Out: 603 [Urine:600; Stool:3] Intake/Output this shift:    General appearance: alert, cooperative and moderate distress Resp: clear to auscultation bilaterally Cardio: rate 120-140 on exam.  reg GI: soft, non-tender; bowel sounds normal; no masses,  no organomegaly Extremities: extremities normal, atraumatic, no cyanosis or edema Renal Tx, normal size and nontender Lab Results:  Recent Labs  03/31/13 0500 04/01/13 0432  WBC 9.8 8.9  HGB 8.0* 8.2*  HCT 25.7* 26.2*  PLT 373 454*   BMET:  Recent Labs  03/30/13 0316 03/31/13 0500  NA 135* 136*  K 4.3 4.3  CL 93* 93*  CO2 25 25  GLUCOSE 102* 52*  BUN 41* 37*  CREATININE 2.22* 2.10*  CALCIUM 7.2* 7.4*   No results found for this basename: PTH,  in the last 72 hours Iron Studies: No results found for this basename: IRON, TIBC, TRANSFERRIN, FERRITIN,  in the last 72 hours  Studies/Results: Dg Chest 2 View  03/31/2013   CLINICAL DATA:  Cough and fever.  Follow-up pulmonary infiltrates.  EXAM: CHEST  2 VIEW  COMPARISON:  03/28/2013  FINDINGS: The cardiac silhouette remains enlarged, unchanged. Patchy bilateral airspace opacities are again seen, greatest in the upper lobes. Left lower lobe opacity has slightly increased from the prior study. Opacities elsewhere appear stable to minimally improved. Small bilateral pleural effusions are unchanged. No pneumothorax is identified. Surgical clips are again seen in the  lower neck and axillae. No acute osseous abnormality is seen.  IMPRESSION: Patchy bilateral airspace opacities concerning for pneumonia, stable to minimally improved in the upper lobes and slightly worsened in the left lower lobe. Unchanged effusions.   Electronically Signed   By: Logan Bores   On: 03/31/2013 08:51    I have reviewed the patient's current medications.  Assessment/Plan: 1 Renal TX with CKD labs Pending. Concern of Rapa toxicity. Will check level in am 2 Anemia stable 3 GIB ?? Source 4  SVT balancing Cardiazem with Rapa, may still need Amio 5 LBP suspect Prostate Ca  ? Documentation 6 HPTH 7CAD P check Rapa, follow SVT, pain control, check Cr    LOS: 12 days   Lory Galan L 04/01/2013,8:15 AM

## 2013-04-01 NOTE — Progress Notes (Signed)
At 1530 pt called with c/o midsternal chest pain and SOB. Sat 98 % ra but 2L applied for comfort. Bp 149/80 HR130 ST. Ekg show sinus tach. Pt states he felt better when he sat up. Po pain meds given.

## 2013-04-01 NOTE — Progress Notes (Signed)
Subjective: Joshua [name] has a mild residual cough and is still producing green colored mucous. He subjectively feels as though he is still improving. He denies any more hemoptysis. No more hematochezia. Patient has completed his bowel prep for his colonoscopy this afternoon. Pain is well controlled. Pt had multiple episodes of atrial tachycardia yesterday evening, though did not have any more episodes since approx midnight last night. Patient is satting well on room air now. Patient expressed to me his frustration with continued hospitalization and that he wants to go home, but is amenable to staying if it is unsafe for him to go home.  Objective: Vital signs in last 24 hours: Filed Vitals:   03/31/13 1953 04/01/13 0052 04/01/13 0225 04/01/13 0538  BP: 127/73 104/79 138/82 131/73  Pulse: 82  139 145  Temp: 99 F (37.2 C)   99.6 F (37.6 C)  TempSrc: Oral   Oral  Resp: 18   18  Height:      Weight:    159 lb 9.8 oz (72.4 kg)  SpO2: 96%   98%  on room air  Weight change: -21 lb 13.2 oz (-9.9 kg)  Intake/Output Summary (Last 24 hours) at 04/01/13 0724 Last data filed at 03/31/13 2300  Gross per 24 hour  Intake    480 ml  Output    603 ml  Net   -123 ml   Physical Exam General: lying comfortably in bed HEENT: /AT, vision grossly intact Cardiac: RRR, harsh systolic ejection murmur loudest over R sternal border Pulm: lungs almost completely CTAB, only some intermittent wheezing to left lower lung; normal work of breathing Abd: soft, nontender, nondistended, BS present Ext: warm and well perfused, no pedal edema Neuro: alert and oriented X3, cranial nerves II-XII grossly intact, moves all extremities spontaneously  Lab Results: Basic Metabolic Panel:  Recent Labs Lab 03/30/13 0316 03/31/13 0500  NA 135* 136*  K 4.3 4.3  CL 93* 93*  CO2 25 25  GLUCOSE 102* 52*  BUN 41* 37*  CREATININE 2.22* 2.10*  CALCIUM 7.2* 7.4*   Liver Function Tests:  Recent Labs Lab  03/27/13 1300  AST 45*  ALT 26  ALKPHOS 385*  BILITOT 0.3  PROT 7.5  ALBUMIN 2.1*   CBC:  Recent Labs Lab 03/28/13 0620  03/29/13 0352  03/31/13 0500 04/01/13 0432  WBC 11.0*  --  10.2  < > 9.8 8.9  NEUTROABS 8.7*  --  7.7  --   --   --   HGB 5.7*  < > 7.2*  < > 8.0* 8.2*  HCT 18.1*  < > 22.5*  < > 25.7* 26.2*  MCV 73.0*  --  73.5*  < > 75.6* 74.2*  PLT 363  --  318  < > 373 454*  < > = values in this interval not displayed. Cardiac Enzymes:  Recent Labs Lab 03/27/13 0930 03/27/13 1615 03/27/13 2310  CKTOTAL 99  --   --   CKMB 1.2  --   --   TROPONINI <0.30 <0.30 <0.30   CBG:  Recent Labs Lab 03/31/13 1408 03/31/13 1720 03/31/13 2116 03/31/13 2215 04/01/13 0018 04/01/13 0409  GLUCAP 135* 100* 199* 191* 302* 265*   Iron/TIBC/Ferritin    Component Value Date/Time   IRON 17* 03/27/2013 0930   TIBC 166* 03/27/2013 0930   FERRITIN 5064* 03/27/2013 0930   BNP    Component Value Date/Time   PROBNP 19450.0* 03/27/2013 1615   Urinalysis    Component Value  Date/Time   COLORURINE YELLOW 03/27/2013 1834   APPEARANCEUR CLOUDY* 03/27/2013 1834   LABSPEC 1.027 03/27/2013 1834   PHURINE 5.0 03/27/2013 1834   GLUCOSEU NEGATIVE 03/27/2013 1834   HGBUR SMALL* 03/27/2013 1834   BILIRUBINUR SMALL* 03/27/2013 [place] 03/27/2013 1834   PROTEINUR 100* 03/27/2013 1834   UROBILINOGEN 1.0 03/27/2013 1834   NITRITE NEGATIVE 03/27/2013 1834   LEUKOCYTESUR NEGATIVE 03/27/2013 1834   Micro Results: Recent Results (from the past 240 hour(s))  CULTURE, BLOOD (ROUTINE X 2)     Status: None   Collection Time    03/27/13  5:25 PM      Result Value Range Status   Specimen Description BLOOD RIGHT HAND   Final   Special Requests BOTTLES DRAWN AEROBIC ONLY 10CC   Final   Culture  Setup Time     Final   Value: 03/27/2013 22:06     Performed at [facility]   Culture     Final   Value:        BLOOD CULTURE RECEIVED NO GROWTH TO DATE CULTURE WILL BE HELD FOR 5  DAYS BEFORE ISSUING A FINAL NEGATIVE REPORT     Performed at [facility]   Report Status PENDING   Incomplete  CULTURE, BLOOD (ROUTINE X 2)     Status: None   Collection Time    03/27/13  5:28 PM      Result Value Range Status   Specimen Description BLOOD RIGHT HAND   Final   Special Requests BOTTLES DRAWN AEROBIC ONLY 10CC   Final   Culture  Setup Time     Final   Value: 03/27/2013 22:05     Performed at [facility]   Culture     Final   Value:        BLOOD CULTURE RECEIVED NO GROWTH TO DATE CULTURE WILL BE HELD FOR 5 DAYS BEFORE ISSUING A FINAL NEGATIVE REPORT     Performed at [facility]   Report Status PENDING   Incomplete  RESPIRATORY VIRUS PANEL     Status: None   Collection Time    03/27/13  6:14 PM      Result Value Range Status   Source - RVPAN NASAL SWAB   Corrected   Comment: CORRECTED ON 01/20 AT 1804: PREVIOUSLY REPORTED AS NASAL SWAB   Respiratory Syncytial Virus A NOT DETECTED   Final   Respiratory Syncytial Virus B NOT DETECTED   Final   Influenza A NOT DETECTED   Final   Influenza B NOT DETECTED   Final   Parainfluenza 1 NOT DETECTED   Final   Parainfluenza 2 NOT DETECTED   Final   Parainfluenza 3 NOT DETECTED   Final   Metapneumovirus NOT DETECTED   Final   Rhinovirus NOT DETECTED   Final   Adenovirus NOT DETECTED   Final   Influenza A H1 NOT DETECTED   Final   Influenza A H3 NOT DETECTED   Final   Comment: (NOTE)           Normal Reference Range for each Analyte: NOT DETECTED     Testing performed using the Luminex xTAG Respiratory Viral Panel test     kit.     This test was developed and its performance characteristics determined     by [facility]. It has not been cleared or approved by the [place]     Food and Drug Administration. This test is  used for clinical purposes.     It should not be regarded as investigational or for research. This     laboratory is certified under the [place] (CLIA) as qualified to perform high complexity     clinical laboratory testing.     Performed at [place], EXPECTORATED SPUTUM-ASSESSMENT     Status: None   Collection Time    03/27/13  6:34 PM      Result Value Range Status   Specimen Description SPUTUM   Final   Special Requests NONE   Final   Sputum evaluation     Final   Value: THIS SPECIMEN IS ACCEPTABLE. RESPIRATORY CULTURE REPORT TO FOLLOW.   Report Status 03/27/2013 FINAL   Final  URINE CULTURE     Status: None   Collection Time    03/27/13  6:34 PM      Result Value Range Status   Specimen Description URINE, CLEAN CATCH   Final   Special Requests NONE   Final   Culture  Setup Time     Final   Value: 03/28/2013 01:05     Performed at [place]     Final   Value: NO GROWTH     Performed at [facility]   Culture     Final   Value: NO GROWTH     Performed at [facility]   Report Status 03/28/2013 FINAL   Final  CULTURE, RESPIRATORY (NON-EXPECTORATED)     Status: None   Collection Time    03/27/13  6:34 PM      Result Value Range Status   Specimen Description SPUTUM   Final   Special Requests NONE   Final   Gram Stain     Final   Value: RARE WBC PRESENT,BOTH PMN AND MONONUCLEAR     NO SQUAMOUS EPITHELIAL CELLS SEEN     RARE GRAM POSITIVE COCCI IN PAIRS     RARE GRAM POSITIVE RODS     Performed at [facility]   Culture     Final   Value: NORMAL OROPHARYNGEAL FLORA     Performed at [facility]   Report Status 03/30/2013 FINAL   Final  MRSA PCR SCREENING     Status: None   Collection Time    03/28/13 10:42 PM      Result Value Range Status   MRSA by PCR NEGATIVE  NEGATIVE Final   Comment:            The GeneXpert MRSA Assay (FDA     approved for NASAL specimens     only), is one component of a     comprehensive MRSA colonization     surveillance program. It is not     intended to diagnose MRSA     infection  nor to guide or     monitor treatment for     MRSA infections.    MISC: sirolimus level 23.9 (high) quantiferon negative PTH 78.5 (H) CMV pcr negative EBV pcr negative BK virus pcr negative Respiratory virus panel negative Pneumocystis smear pending Aspergillus antigen pending Sputum culture- normal oropharyngeal flora; fungal and AFB culture pending UCX NGTD BCX NGTD x 2 FOBT negative, repeat pending FENa- .2%  Dg Chest 2 View  03/27/2013   CLINICAL DATA:  Cough, hemoptysis  EXAM: CHEST  2 VIEW  COMPARISON:  03/23/2013  FINDINGS: Multifocal  patchy airspace opacities, most prominent in the right perihilar region, mildly increased. Differential considerations include mild to moderate interstitial edema or possibly multifocal infection.  Suspected small left pleural effusion.  No pneumothorax.  Cardiomegaly.  Surgical clips overlying the bilateral neck.  IMPRESSION: Multifocal patchy airspace opacities, most prominent in the right perihilar region, mildly increased. Differential considerations include mild to moderate interstitial edema or possibly multifocal infection.  Cardiomegaly with suspected small left pleural effusion.   Electronically Signed   By: Julian Hy M.D.   On: 03/27/2013 13:00   Dg Chest 2 View  03/23/2013   CLINICAL DATA:  Chest pain with history of congestive heart failure  EXAM: CHEST  2 VIEW  COMPARISON:  03/20/2013  FINDINGS: Heart size upper normal. Mild vascular congestion has developed when compared to the prior study. There is bilateral perihilar peribronchial cuffing with indistinct bilateral perihilar vessels and mild hazy opacity in the perihilar regions bilaterally. There are no pleural effusions. Surgical clips in the thyroid bed again identified.  IMPRESSION: Findings suggest development of congestive heart failure with mild pulmonary edema.   Electronically Signed   By: Skipper Cliche M.D.   On: 03/23/2013 16:22   Dg Chest 2 View  03/20/2013    CLINICAL DATA:  Chest and back pain.  Hypertension.  EXAM: CHEST  2 VIEW  COMPARISON:  08/19/2011  FINDINGS: The lungs are clear without focal infiltrate, edema, pneumothorax or pleural effusion. Cardiopericardial silhouette is at upper limits of normal for size. Imaged bony structures of the thorax are intact. Surgical clips are seen in the thyroid bed and left axilla.  IMPRESSION: Stable.  No acute cardiopulmonary process.   Electronically Signed   By: Misty Stanley M.D.   On: 03/20/2013 07:45   Ct Chest Wo Contrast  03/27/2013   CLINICAL DATA:  Chest pain and shortness of breath for several months. Low-grade fever. Acute renal failure in transplant patient.  EXAM: CT CHEST WITHOUT CONTRAST  TECHNIQUE: Multidetector CT imaging of the chest was performed following the standard protocol without IV contrast.  COMPARISON:  Chest radiograph performed earlier today at 11:45 a.m.  FINDINGS: Diffuse fluffy bilateral central airspace opacity is seen, most prominent at the upper lung lobes bilaterally. This is most compatible with diffuse pneumonia. Underlying edema cannot be excluded, given underlying small bilateral pleural effusions and interstitial prominence. No pneumothorax is seen.  Scattered coronary artery calcifications are seen. The heart is mildly enlarged. A mildly prominent right paratracheal node is seen, measuring 1.3 cm in short axis. No pericardial effusion is identified. The great vessels are grossly unremarkable in appearance. The patient is status post thyroidectomy. No axillary lymphadenopathy is seen.  The visualized portions of the liver and the spleen are unremarkable in appearance. Severe bilateral renal atrophy is seen, with scattered calcifications noted bilaterally. A small hiatal hernia is noted.  IMPRESSION: 1. Diffuse fluffy bilateral central airspace opacification, most prominent at the upper lung lobes. This is most compatible with diffuse multifocal pneumonia. Underlying edema cannot be  excluded, given small bilateral pleural effusions and interstitial prominence. 2. Scattered coronary calcifications seen. 3. Mild cardiomegaly. 4. Mildly prominent right paratracheal node, measuring 1.3 cm in short axis. 5. Severe bilateral renal atrophy, with scattered associated calcifications. 6. Small hiatal hernia seen.   Electronically Signed   By: Garald Balding M.D.   On: 03/27/2013 21:48   Nm Bone Scan Whole Body  03/03/2013   CLINICAL DATA:  Pain.  Prostate cancer.  EXAM: NUCLEAR MEDICINE WHOLE BODY  BONE SCAN  TECHNIQUE: Whole body anterior and posterior images were obtained approximately 3 hours after intravenous injection of radiopharmaceutical.  COMPARISON:  Bone scan 10/26/2007.  CT 02/28/2013.  RADIOPHARMACEUTICALS:  26.0 mCi Technetium-99 MDP  FINDINGS: Bilateral renal atrophy with a transplanted kidney right pelvis. No focal bony abnormality. No evidence of bony metastatic disease.  IMPRESSION: No evidence of bony metastatic disease or focal bony abnormality. Bilateral renal atrophy. Transplanted kidney right pelvis.   Electronically Signed   By: Marcello Moores  Register   On: 03/03/2013 14:02   US Renal  03/22/2013   CLINICAL DATA:  Acute kidney injury. Patient with bilateral renal transplant.  EXAM: RENAL/URINARY TRACT ULTRASOUND COMPLETE  COMPARISON:  CT, 02/28/2013  FINDINGS: Right Kidney:  Length: 7.4 cm. Kidney is echogenic. 15 mm cyst arises from the upper pole. There are probable additional tiny cysts. No hydronephrosis.  Left Kidney:  Native left kidney not visualized.  Transplant kidney: In the right lower quadrant to right upper pelvis, a transplant kidney is visualized. Is normal in overall size measuring 10.7 cm. It has normal parenchymal echogenicity. Small cyst arises from its lower pole measuring 1 cm. No other renal masses. No hydronephrosis.  Bladder:  Appears normal for degree of bladder distention.  IMPRESSION: 1. Transplant kidney is normal in appearance within a small lower pole  cyst measuring 1 cm. 2. Atrophic native kidneys. Only the right was visualized. Is echogenic with at least 1 discrete small cysts. No hydronephrosis.   Electronically Signed   By: Lajean Manes M.D.   On: 03/22/2013 15:50   Nm Myocar Multi W/spect W/wall Motion / Ef  03/22/2013   CLINICAL DATA:  63yo AA man with PMH of HTN, DM2, CAD with stents, CAD, PVD s/p fem pop, prostate cancer treated with radiation, hx of thyroid cancer, h/o ESRD and kidney transplantation who presents for chest pain and back pain  EXAM: MYOCARDIAL IMAGING WITH SPECT (REST)  TECHNIQUE: Standard myocardial SPECT imaging was performed after resting intravenous injection of Tc-4msestamibi. Quantitative gated imaging was also performed to evaluate left ventricular wall motion, and estimate left ventricular ejection fraction. As the patient's heart rate was 129 bpm when he presented for his "stress" images, no additional administration of Lexiscan was performed.  COMPARISON:  None.  FINDINGS: The Image quality is good. There is mild interference from visceral racer uptake. The left ventricle is moderately to severely dilated. There is a moderate in size, moderate-to-severe in intensity inferior and inferolateral perfusion abnormality seen on both rest and "stress" images, possibly worse on the "stress" images. There is global hypokinesis of the left ventricle, with severe basal and mid inferior wall hypokinesis. EF 34%. The severity of LV dysfunction is out of proportion to the perfusion abnormality.  IMPRESSION: Interpretation of the study is severely limited by absence of a clear increase in coronary flow during acquisition of the "stress" images  There is evidence of severe ischemia at rest and/or scar in the right coronary artery distribution. This appears to be at least partly reversible.  The degree of LV dysfunction suggests multivessel CAD versus a component of superimposed nonischemic cardiomyopathy.  No previous studies are  available for comparison   Electronically Signed   By: MSanda Klein  On: 03/22/2013 19:29   Nm Pulmonary Perf And Vent  03/20/2013   CLINICAL DATA:  Chest pain  EXAM: NUCLEAR MEDICINE VENTILATION - PERFUSION LUNG SCAN  Views: Anterior, posterior, left lateral, right lateral, RPO, LPO, RAO, LAO -ventilation and perfusion  Radionuclide: Technetium 46mDTPA -ventilation; Technetium 959macroaggregated albumin-perfusion  Dose:  40.0 mCi-ventilation; 6.0 mCi-perfusion  Route of administration: Inhalation -ventilation; intravenous -perfusion  COMPARISON:  Chest radiograph March 20, 2013  FINDINGS: Radiotracer uptake on the ventilation study is homogeneous and symmetric bilaterally.  Radiotracer uptake on the perfusion study is homogeneous and symmetric bilaterally.  There is no appreciable ventilation/perfusion mismatch.  IMPRESSION: No ventilation or perfusion abnormalities. Very low probability of pulmonary embolus.   Electronically Signed   By: WiLowella Grip.D.   On: 03/20/2013 16:15   2D echo 03/22/2012- Study Conclusions  - Left ventricle: The cavity size was mildly dilated. Wall thickness was normal. Systolic function was mildly reduced. The estimated ejection fraction was in the range of 45% to 50%. There is hypokinesis of the lateral myocardium. Doppler parameters are consistent with restrictive physiology, indicative of decreased left ventricular diastolic compliance and/or increased left atrial pressure. Doppler parameters are consistent with high ventricular filling pressure. - Aortic valve: Valve mobility was restricted. There was mild to moderate stenosis. Trivial regurgitation. Valve area: 1.56cm^2(VTI). Valve area: 1.47cm^2 (Vmax). - Mitral valve: Mild regurgitation. - Left atrium: The atrium was mildly dilated. - Pulmonary arteries: Systolic pressure was mildly increased. PA peak pressure: 437mg (S).  Medications: I have reviewed the patient's current  medications. Scheduled Meds: . aspirin  81 mg Oral Daily  . atorvastatin  80 mg Oral q1800  . diltiazem  120 mg Oral Daily  . feeding supplement (ENSURE COMPLETE)  237 mL Oral TID BM  . insulin aspart  0-9 Units Subcutaneous TID WC  . insulin glargine  28 Units Subcutaneous Daily  . isosorbide mononitrate  30 mg Oral Daily  . levofloxacin  750 mg Oral Q48H  . levothyroxine  150 mcg Oral Daily  . methocarbamol  500 mg Oral BID  . metoprolol tartrate  50 mg Oral Q6H  . mycophenolate  720 mg Oral BID  . pantoprazole  40 mg Oral Daily  . regadenoson  0.4 mg Intravenous Once  . sirolimus  2 mg Oral Daily  . sodium bicarbonate  650 mg Oral BID   Continuous Infusions:   PRN Meds:.sodium chloride, oxyCODONE, sodium chloride  Assessment/Plan:  HCAP: Continues to improve. Still with some sputum production, but no longer requires oxygen. No more hemoptysis. He continues to express strong desire to go home.  -today is day 5 of antibiotics-->continue levaquin 750m77m q48h on 1/23 (total of 8 day course planned) -Pulmonology consult- no need for bronchoscopy at this time; will need follow up CXR to assess resolution in 4-6 weeks -PT/OT- recommend HHPT/OT and walker  Acute GI bleed w/ dropping hemoglobin on 03/28/2013: No more diarrhea or BRBPR. Hb stable today 8.2.  -EGD done 1/23 was wnl -colonoscopy 1/23 was incomplete 2/2 ventral hernia -I spoke to GI this morning--they had originally ordered a barium enema for Monday, but are amenable to doing this as outpatient if patient is ready to be discharged this weekend  -hold heparin, ASA, plavix (1/20 first day without any of these medications)  Chest pain with hx of CAD, dCHF and current atrial tachycardia- No more chest pain overnight. However, patient is still having episodes of atrial tachycardia. Will need to discuss plan with cardiology today. -diltiazem 120mg25mly (started 1/22) -continue metoprolol tartrate to 50mg 40m-cardiology  following, appreciate recs -holding ASA, plavix, heparin in setting of GI bleed (on 1/20- Hb stable now without further bleeding) -Continue cardiac medications: Nitroglycerin, lipitor, metoprolol, IMDUR, diltiazem  Anion Gap Metabolic acidosis: Unclear etiology. Starvation ketoacidosis vs. medication side effect vs A/CKD. Sirolimus and mycophenolate can both cause acidosis, though unsure if they specifically can cause an increase in AG. Of note, sirolimus level on 1/19 was elevated at 23.9, which may have contributed to this. 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  -continue sirolimus 71m daily -renal following, appreciate recs  Metastatic Prostate cancer with back pain: Back pain continues, though seemingly well controlled on oxycodone. -Outpatient uro-oncology work up  HTN: stable -continue IMDUR 326mdaily and metoprolol 5056m6h, diltiazem 120m20mily  DM-II: A1c 7.8 on admission.CBGs stable overnight. -continue lantus 28U daily -SSI sensitive   DVT px: SCDs Diet: carb modified Dispo:  planning for discharge soon, perhaps today, but may need to wait until tomorrow  The patient does have a current PCP (TracSueanne Margarita) and does not need an [facility] follow-up appointment after discharge.  The patient does not have transportation limitations that hinder transportation to clinic appointments.  Services Needed at time of discharge: Y = Yes, Blank = No PT:   OT:   RN:   Equipment:   Other:     LOS: 12 days   RachRebecca Eaton 04/01/2013, 7:24 AM

## 2013-04-01 NOTE — Progress Notes (Signed)
04/01/13 Went into patient's room to give blood pressure medication so blood pressure was taken. Noticed that patient's HR on the Dinamap machine was elevated in the 140's-150's and blood pressure was 104/79. Pt.was asymptomatic. MD on call with Internal Medicine, Dr.Komanski was notified. Asked if patient could be placed back on telemetry. Pt.placed back on telemetry and scheduled metoprolol was given. Called MD on call gain around 0138 because patient's HR was around mid 140's and pt. asymptomatic. Was told to call again if patient's HR was continued to sustained. Paged MD on call around 0217 to notify of patient's HR in the 140's. Was told to get another BP. BP and HR was taken and documented and physician is aware. Pt.is still asymptomatic. Will continue to monitor.

## 2013-04-02 LAB — GLUCOSE, CAPILLARY
GLUCOSE-CAPILLARY: 104 mg/dL — AB (ref 70–99)
Glucose-Capillary: 106 mg/dL — ABNORMAL HIGH (ref 70–99)
Glucose-Capillary: 110 mg/dL — ABNORMAL HIGH (ref 70–99)
Glucose-Capillary: 134 mg/dL — ABNORMAL HIGH (ref 70–99)
Glucose-Capillary: 157 mg/dL — ABNORMAL HIGH (ref 70–99)

## 2013-04-02 LAB — BASIC METABOLIC PANEL
BUN: 36 mg/dL — ABNORMAL HIGH (ref 6–23)
CALCIUM: 6.9 mg/dL — AB (ref 8.4–10.5)
CO2: 24 mEq/L (ref 19–32)
CREATININE: 2.67 mg/dL — AB (ref 0.50–1.35)
Chloride: 91 mEq/L — ABNORMAL LOW (ref 96–112)
GFR calc Af Amer: 28 mL/min — ABNORMAL LOW (ref >90)
GFR, EST NON AFRICAN AMERICAN: 24 mL/min — AB (ref >90)
GLUCOSE: 112 mg/dL — AB (ref 70–99)
Potassium: 4.2 mEq/L (ref 3.7–5.3)
Sodium: 133 mEq/L — ABNORMAL LOW (ref 137–147)

## 2013-04-02 LAB — CULTURE, BLOOD (ROUTINE X 2)
Culture: NO GROWTH
Culture: NO GROWTH

## 2013-04-02 NOTE — Progress Notes (Signed)
SUBJECTIVE:  No compliants except wants to go home  OBJECTIVE:   Vitals:   Filed Vitals:   04/01/13 1356 04/01/13 1959 04/02/13 0045 04/02/13 0443  BP: 147/81 119/72 122/58 146/67  Pulse: 141 98 83 90  Temp: 99.2 F (37.3 C) 98 F (36.7 C)  98.4 F (36.9 C)  TempSrc: Oral Oral    Resp: 18 18  18   Height:      Weight:    158 lb 15.2 oz (72.1 kg)  SpO2: 100% 99%  99%   I&O's:   Intake/Output Summary (Last 24 hours) at 04/02/13 1416 Last data filed at 04/02/13 0431  Gross per 24 hour  Intake    360 ml  Output    100 ml  Net    260 ml   TELEMETRY: Reviewed telemetry pt in NSR with one episode of PAT this am     PHYSICAL EXAM General: Well developed, well nourished, in no acute distress Head: Eyes PERRLA, No xanthomas.   Normal cephalic and atramatic  Lungs:   Clear bilaterally to auscultation and percussion. Heart:   HRRR S1 S2 Pulses are 2+ & equal. Abdomen: Bowel sounds are positive, abdomen soft and non-tender without masses  Extremities:   No clubbing, cyanosis or edema.  DP +1 Neuro: Alert and oriented X 3. Psych:  Good affect, responds appropriately   LABS: Basic Metabolic Panel:  Recent Labs  04/01/13 0918 04/02/13 0615  NA 134* 133*  K 3.9 4.2  CL 90* 91*  CO2 24 24  GLUCOSE 211* 112*  BUN 33* 36*  CREATININE 2.18* 2.67*  CALCIUM 7.1* 6.9*  PHOS 3.2  --    Liver Function Tests:  Recent Labs  04/01/13 0918  ALBUMIN 2.0*   No results found for this basename: LIPASE, AMYLASE,  in the last 72 hours CBC:  Recent Labs  03/31/13 0500 04/01/13 0432  WBC 9.8 8.9  HGB 8.0* 8.2*  HCT 25.7* 26.2*  MCV 75.6* 74.2*  PLT 373 454*   Cardiac Enzymes: No results found for this basename: CKTOTAL, CKMB, CKMBINDEX, TROPONINI,  in the last 72 hours BNP: No components found with this basename: POCBNP,  D-Dimer: No results found for this basename: DDIMER,  in the last 72 hours Hemoglobin A1C: No results found for this basename: HGBA1C,  in the last  72 hours Fasting Lipid Panel: No results found for this basename: CHOL, HDL, LDLCALC, TRIG, CHOLHDL, LDLDIRECT,  in the last 72 hours Thyroid Function Tests: No results found for this basename: TSH, T4TOTAL, FREET3, T3FREE, THYROIDAB,  in the last 72 hours Anemia Panel: No results found for this basename: VITAMINB12, FOLATE, FERRITIN, TIBC, IRON, RETICCTPCT,  in the last 72 hours Coag Panel:   Lab Results  Component Value Date   INR 1.14 03/20/2013   INR 1.02 08/19/2011   INR 1.08 03/25/2009    RADIOLOGY: Dg Chest 2 View  03/31/2013   CLINICAL DATA:  Cough and fever.  Follow-up pulmonary infiltrates.  EXAM: CHEST  2 VIEW  COMPARISON:  03/28/2013  FINDINGS: The cardiac silhouette remains enlarged, unchanged. Patchy bilateral airspace opacities are again seen, greatest in the upper lobes. Left lower lobe opacity has slightly increased from the prior study. Opacities elsewhere appear stable to minimally improved. Small bilateral pleural effusions are unchanged. No pneumothorax is identified. Surgical clips are again seen in the lower neck and axillae. No acute osseous abnormality is seen.  IMPRESSION: Patchy bilateral airspace opacities concerning for pneumonia, stable to minimally improved in the  upper lobes and slightly worsened in the left lower lobe. Unchanged effusions.   Electronically Signed   By: Logan Bores   On: 03/31/2013 08:51   Dg Chest 2 View  03/29/2013   CLINICAL DATA:  Shortness of breath and cough.  EXAM: CHEST  2 VIEW  COMPARISON:  Chest radiograph and CT of the chest performed 03/27/2013  FINDINGS: The lungs are well-aerated. Fluffy bilateral central airspace opacification is again noted bilaterally, perhaps slightly improved from the prior study at the right lung base. Small bilateral pleural effusions are noted. The appearance on CT is suspicious for multifocal pneumonia, though superimposed interstitial edema cannot be excluded. There is no evidence of pneumothorax.  The heart is  borderline enlarged. No acute osseous abnormalities are seen. Postoperative change is seen about the thyroid bed. Clips are seen at the left axilla.  IMPRESSION: 1. Persistent fluffy bilateral central airspace opacification, perhaps slightly improved at the right lung base. Small bilateral pleural effusions seen. The appearance on CT is suspicious for significant multifocal pneumonia, though superimposed interstitial edema cannot be excluded. 2. Borderline cardiomegaly.   Electronically Signed   By: Garald Balding M.D.   On: 03/29/2013 01:31   Dg Chest 2 View  03/27/2013   CLINICAL DATA:  Cough, hemoptysis  EXAM: CHEST  2 VIEW  COMPARISON:  03/23/2013  FINDINGS: Multifocal patchy airspace opacities, most prominent in the right perihilar region, mildly increased. Differential considerations include mild to moderate interstitial edema or possibly multifocal infection.  Suspected small left pleural effusion.  No pneumothorax.  Cardiomegaly.  Surgical clips overlying the bilateral neck.  IMPRESSION: Multifocal patchy airspace opacities, most prominent in the right perihilar region, mildly increased. Differential considerations include mild to moderate interstitial edema or possibly multifocal infection.  Cardiomegaly with suspected small left pleural effusion.   Electronically Signed   By: Julian Hy M.D.   On: 03/27/2013 13:00   Dg Chest 2 View  03/23/2013   CLINICAL DATA:  Chest pain with history of congestive heart failure  EXAM: CHEST  2 VIEW  COMPARISON:  03/20/2013  FINDINGS: Heart size upper normal. Mild vascular congestion has developed when compared to the prior study. There is bilateral perihilar peribronchial cuffing with indistinct bilateral perihilar vessels and mild hazy opacity in the perihilar regions bilaterally. There are no pleural effusions. Surgical clips in the thyroid bed again identified.  IMPRESSION: Findings suggest development of congestive heart failure with mild pulmonary edema.    Electronically Signed   By: Skipper Cliche M.D.   On: 03/23/2013 16:22   Dg Chest 2 View  03/20/2013   CLINICAL DATA:  Chest and back pain.  Hypertension.  EXAM: CHEST  2 VIEW  COMPARISON:  08/19/2011  FINDINGS: The lungs are clear without focal infiltrate, edema, pneumothorax or pleural effusion. Cardiopericardial silhouette is at upper limits of normal for size. Imaged bony structures of the thorax are intact. Surgical clips are seen in the thyroid bed and left axilla.  IMPRESSION: Stable.  No acute cardiopulmonary process.   Electronically Signed   By: Misty Stanley M.D.   On: 03/20/2013 07:45   Ct Chest Wo Contrast  03/27/2013   CLINICAL DATA:  Chest pain and shortness of breath for several months. Low-grade fever. Acute renal failure in transplant patient.  EXAM: CT CHEST WITHOUT CONTRAST  TECHNIQUE: Multidetector CT imaging of the chest was performed following the standard protocol without IV contrast.  COMPARISON:  Chest radiograph performed earlier today at 11:45 a.m.  FINDINGS: Diffuse fluffy  bilateral central airspace opacity is seen, most prominent at the upper lung lobes bilaterally. This is most compatible with diffuse pneumonia. Underlying edema cannot be excluded, given underlying small bilateral pleural effusions and interstitial prominence. No pneumothorax is seen.  Scattered coronary artery calcifications are seen. The heart is mildly enlarged. A mildly prominent right paratracheal node is seen, measuring 1.3 cm in short axis. No pericardial effusion is identified. The great vessels are grossly unremarkable in appearance. The patient is status post thyroidectomy. No axillary lymphadenopathy is seen.  The visualized portions of the liver and the spleen are unremarkable in appearance. Severe bilateral renal atrophy is seen, with scattered calcifications noted bilaterally. A small hiatal hernia is noted.  IMPRESSION: 1. Diffuse fluffy bilateral central airspace opacification, most prominent  at the upper lung lobes. This is most compatible with diffuse multifocal pneumonia. Underlying edema cannot be excluded, given small bilateral pleural effusions and interstitial prominence. 2. Scattered coronary calcifications seen. 3. Mild cardiomegaly. 4. Mildly prominent right paratracheal node, measuring 1.3 cm in short axis. 5. Severe bilateral renal atrophy, with scattered associated calcifications. 6. Small hiatal hernia seen.   Electronically Signed   By: Garald Balding M.D.   On: 03/27/2013 21:48   US Renal  03/22/2013   CLINICAL DATA:  Acute kidney injury. Patient with bilateral renal transplant.  EXAM: RENAL/URINARY TRACT ULTRASOUND COMPLETE  COMPARISON:  CT, 02/28/2013  FINDINGS: Right Kidney:  Length: 7.4 cm. Kidney is echogenic. 15 mm cyst arises from the upper pole. There are probable additional tiny cysts. No hydronephrosis.  Left Kidney:  Native left kidney not visualized.  Transplant kidney: In the right lower quadrant to right upper pelvis, a transplant kidney is visualized. Is normal in overall size measuring 10.7 cm. It has normal parenchymal echogenicity. Small cyst arises from its lower pole measuring 1 cm. No other renal masses. No hydronephrosis.  Bladder:  Appears normal for degree of bladder distention.  IMPRESSION: 1. Transplant kidney is normal in appearance within a small lower pole cyst measuring 1 cm. 2. Atrophic native kidneys. Only the right was visualized. Is echogenic with at least 1 discrete small cysts. No hydronephrosis.   Electronically Signed   By: Lajean Manes M.D.   On: 03/22/2013 15:50   Nm Myocar Multi W/spect W/wall Motion / Ef  03/22/2013   CLINICAL DATA:  63yo AA man with PMH of HTN, DM2, CAD with stents, CAD, PVD s/p fem pop, prostate cancer treated with radiation, hx of thyroid cancer, h/o ESRD and kidney transplantation who presents for chest pain and back pain  EXAM: MYOCARDIAL IMAGING WITH SPECT (REST)  TECHNIQUE: Standard myocardial SPECT imaging was  performed after resting intravenous injection of Tc-33m sestamibi. Quantitative gated imaging was also performed to evaluate left ventricular wall motion, and estimate left ventricular ejection fraction. As the patient's heart rate was 129 bpm when he presented for his "stress" images, no additional administration of Lexiscan was performed.  COMPARISON:  None.  FINDINGS: The Image quality is good. There is mild interference from visceral racer uptake. The left ventricle is moderately to severely dilated. There is a moderate in size, moderate-to-severe in intensity inferior and inferolateral perfusion abnormality seen on both rest and "stress" images, possibly worse on the "stress" images. There is global hypokinesis of the left ventricle, with severe basal and mid inferior wall hypokinesis. EF 34%. The severity of LV dysfunction is out of proportion to the perfusion abnormality.  IMPRESSION: Interpretation of the study is severely limited by absence of a  clear increase in coronary flow during acquisition of the "stress" images  There is evidence of severe ischemia at rest and/or scar in the right coronary artery distribution. This appears to be at least partly reversible.  The degree of LV dysfunction suggests multivessel CAD versus a component of superimposed nonischemic cardiomyopathy.  No previous studies are available for comparison   Electronically Signed   By: Sanda Klein   On: 03/22/2013 19:29   Nm Pulmonary Perf And Vent  03/20/2013   CLINICAL DATA:  Chest pain  EXAM: NUCLEAR MEDICINE VENTILATION - PERFUSION LUNG SCAN  Views: Anterior, posterior, left lateral, right lateral, RPO, LPO, RAO, LAO -ventilation and perfusion  Radionuclide: Technetium 86m DTPA -ventilation; Technetium 48m macroaggregated albumin-perfusion  Dose:  40.0 mCi-ventilation; 6.0 mCi-perfusion  Route of administration: Inhalation -ventilation; intravenous -perfusion  COMPARISON:  Chest radiograph March 20, 2013  FINDINGS:  Radiotracer uptake on the ventilation study is homogeneous and symmetric bilaterally.  Radiotracer uptake on the perfusion study is homogeneous and symmetric bilaterally.  There is no appreciable ventilation/perfusion mismatch.  IMPRESSION: No ventilation or perfusion abnormalities. Very low probability of pulmonary embolus.   Electronically Signed   By: Lowella Grip M.D.   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 Also has a/c dCHF, mild AS, DM, HTN, with interval development of paroxysmal atrial tach difficult to control, ARF/CKD in setting of possible sirolimus toxicity, hyperkalemia tx with kayexalate, fever, bilateral airspace disease of questionable etiology, hemoptysis and hematochezia and severe anemia.  1) PAT - challenging. Diltiazem is not sufficient. Added Multaq which has significantly decreased episodes of PAT.  2) GI bleed - Hg stable. Holding Plavix.  3) NSVT - no further issue  4) Mild to mod AS - stable, should be of no clinical consequence at this time.  5) CAD - med mgt.       Sueanne Margarita, MD  04/02/2013  2:16 PM

## 2013-04-02 NOTE — Progress Notes (Signed)
Subjective: [name] feels well this morning. He has no complaints. His cough continues to improve. Pt in NSR with HR 81 when I evaluated him this morning. Telemetry showed multiple episodes of atrial tachycardia yesterday evening, but HR has been well controlled since approx midnight last night.   Objective: Vital signs in last 24 hours: Filed Vitals:   04/01/13 1356 04/01/13 1959 04/02/13 0045 04/02/13 0443  BP: 147/81 119/72 122/58 146/67  Pulse: 141 98 83 90  Temp: 99.2 F (37.3 C) 98 F (36.7 C)  98.4 F (36.9 C)  TempSrc: Oral Oral    Resp: _0 Height:      Weight:    158 lb 15.2 oz (72.1 kg)  SpO2: 100% 99%  99%  on room air  Weight change: -10.6 oz (-0.3 kg)  Intake/Output Summary (Last 24 hours) at 04/02/13 1302 Last data filed at 04/02/13 0431  Gross per 24 hour  Intake    360 ml  Output    100 ml  Net    260 ml   Physical Exam General: lying comfortably in bed HEENT: [place]/AT, vision grossly intact Cardiac: RRR, harsh systolic ejection murmur loudest over R sternal border Pulm: there is mild wheezing to RUL, otherwise CTAB Abd: soft, nontender, nondistended, BS present Ext: warm and well perfused, no pedal edema; many excoriations to BLE  Neuro: alert and oriented X3, cranial nerves II-XII grossly intact, moves all extremities spontaneously  Lab Results: Basic Metabolic Panel:  Recent Labs Lab 04/01/13 0918 04/02/13 0615  NA 134* 133*  K 3.9 4.2  CL 90* 91*  CO2 24 24  GLUCOSE 211* 112*  BUN 33* 36*  CREATININE 2.18* 2.67*  CALCIUM 7.1* 6.9*  PHOS 3.2  --    Liver Function Tests:  Recent Labs Lab 03/27/13 1300 04/01/13 0918  AST 45*  --   ALT 26  --   ALKPHOS 385*  --   BILITOT 0.3  --   PROT 7.5  --   ALBUMIN 2.1* 2.0*   CBC:  Recent Labs Lab 03/28/13 0620  03/29/13 0352  03/31/13 0500 04/01/13 0432  WBC 11.0*  --  10.2  < > 9.8 8.9  NEUTROABS 8.7*  --  7.7  --   --   --   HGB 5.7*  < > 7.2*  < > 8.0* 8.2*  HCT  18.1*  < > 22.5*  < > 25.7* 26.2*  MCV 73.0*  --  73.5*  < > 75.6* 74.2*  PLT 363  --  318  < > 373 454*  < > = values in this interval not displayed. Cardiac Enzymes:  Recent Labs Lab 03/27/13 0930 03/27/13 1615 03/27/13 2310  CKTOTAL 99  --   --   CKMB 1.2  --   --   TROPONINI <0.30 <0.30 <0.30   CBG:  Recent Labs Lab 04/01/13 1620 04/01/13 1936 04/02/13 0057 04/02/13 0440 04/02/13 0746 04/02/13 1127  GLUCAP 116* 104* 134* 110* 104* 157*   Iron/TIBC/Ferritin    Component Value Date/Time   IRON 17* 03/27/2013 0930   TIBC 166* 03/27/2013 0930   FERRITIN 5064* 03/27/2013 0930   BNP    Component Value Date/Time   PROBNP 19450.0* 03/27/2013 1615   Urinalysis    Component Value Date/Time   COLORURINE YELLOW 03/27/2013 1834   APPEARANCEUR CLOUDY* 03/27/2013 1834   LABSPEC 1.027 03/27/2013 1834   PHURINE 5.0 03/27/2013 1834   GLUCOSEU NEGATIVE 03/27/2013 1834   HGBUR  SMALL* 03/27/2013 1834   BILIRUBINUR SMALL* 03/27/2013 [place] 03/27/2013 1834   PROTEINUR 100* 03/27/2013 1834   UROBILINOGEN 1.0 03/27/2013 1834   NITRITE NEGATIVE 03/27/2013 1834   LEUKOCYTESUR NEGATIVE 03/27/2013 1834   Micro Results: Recent Results (from the past 240 hour(s))  CULTURE, BLOOD (ROUTINE X 2)     Status: None   Collection Time    03/27/13  5:25 PM      Result Value Range Status   Specimen Description BLOOD RIGHT HAND   Final   Special Requests BOTTLES DRAWN AEROBIC ONLY 10CC   Final   Culture  Setup Time     Final   Value: 03/27/2013 22:06     Performed at [facility]   Culture     Final   Value:        BLOOD CULTURE RECEIVED NO GROWTH TO DATE CULTURE WILL BE HELD FOR 5 DAYS BEFORE ISSUING A FINAL NEGATIVE REPORT     Performed at [facility]   Report Status PENDING   Incomplete  CULTURE, BLOOD (ROUTINE X 2)     Status: None   Collection Time    03/27/13  5:28 PM      Result Value Range Status   Specimen Description BLOOD RIGHT HAND   Final    Special Requests BOTTLES DRAWN AEROBIC ONLY 10CC   Final   Culture  Setup Time     Final   Value: 03/27/2013 22:05     Performed at [facility]   Culture     Final   Value:        BLOOD CULTURE RECEIVED NO GROWTH TO DATE CULTURE WILL BE HELD FOR 5 DAYS BEFORE ISSUING A FINAL NEGATIVE REPORT     Performed at [facility]   Report Status PENDING   Incomplete  RESPIRATORY VIRUS PANEL     Status: None   Collection Time    03/27/13  6:14 PM      Result Value Range Status   Source - RVPAN NASAL SWAB   Corrected   Comment: CORRECTED ON 01/20 AT 1804: PREVIOUSLY REPORTED AS NASAL SWAB   Respiratory Syncytial Virus A NOT DETECTED   Final   Respiratory Syncytial Virus B NOT DETECTED   Final   Influenza A NOT DETECTED   Final   Influenza B NOT DETECTED   Final   Parainfluenza 1 NOT DETECTED   Final   Parainfluenza 2 NOT DETECTED   Final   Parainfluenza 3 NOT DETECTED   Final   Metapneumovirus NOT DETECTED   Final   Rhinovirus NOT DETECTED   Final   Adenovirus NOT DETECTED   Final   Influenza A H1 NOT DETECTED   Final   Influenza A H3 NOT DETECTED   Final   Comment: (NOTE)           Normal Reference Range for each Analyte: NOT DETECTED     Testing performed using the Luminex xTAG Respiratory Viral Panel test     kit.     This test was developed and its performance characteristics determined     by [facility]. It has not been cleared or approved by the [place]     Food and Drug Administration. This test is used for clinical purposes.     It should not be regarded as investigational or for research. This     laboratory is certified under the Clinical Laboratory Improvement  Amendments of 1988 (CLIA) as qualified to perform high complexity     clinical laboratory testing.     Performed at [facility], EXPECTORATED SPUTUM-ASSESSMENT     Status: None   Collection Time    03/27/13  6:34 PM      Result Value Range Status   Specimen Description SPUTUM    Final   Special Requests NONE   Final   Sputum evaluation     Final   Value: THIS SPECIMEN IS ACCEPTABLE. RESPIRATORY CULTURE REPORT TO FOLLOW.   Report Status 03/27/2013 FINAL   Final  URINE CULTURE     Status: None   Collection Time    03/27/13  6:34 PM      Result Value Range Status   Specimen Description URINE, CLEAN CATCH   Final   Special Requests NONE   Final   Culture  Setup Time     Final   Value: 03/28/2013 01:05     Performed at [facility]     Final   Value: NO GROWTH     Performed at [facility]   Culture     Final   Value: NO GROWTH     Performed at [facility]   Report Status 03/28/2013 FINAL   Final  CULTURE, RESPIRATORY (NON-EXPECTORATED)     Status: None   Collection Time    03/27/13  6:34 PM      Result Value Range Status   Specimen Description SPUTUM   Final   Special Requests NONE   Final   Gram Stain     Final   Value: RARE WBC PRESENT,BOTH PMN AND MONONUCLEAR     NO SQUAMOUS EPITHELIAL CELLS SEEN     RARE GRAM POSITIVE COCCI IN PAIRS     RARE GRAM POSITIVE RODS     Performed at [facility]   Culture     Final   Value: NORMAL OROPHARYNGEAL FLORA     Performed at [facility]   Report Status 03/30/2013 FINAL   Final  MRSA PCR SCREENING     Status: None   Collection Time    03/28/13 10:42 PM      Result Value Range Status   MRSA by PCR NEGATIVE  NEGATIVE Final   Comment:            The GeneXpert MRSA Assay (FDA     approved for NASAL specimens     only), is one component of a     comprehensive MRSA colonization     surveillance program. It is not     intended to diagnose MRSA     infection nor to guide or     monitor treatment for     MRSA infections.    MISC: sirolimus level 23.9 (high) quantiferon negative PTH 78.5 (H) CMV pcr negative EBV pcr negative BK virus pcr negative Respiratory virus panel negative Pneumocystis smear pending Aspergillus antigen pending Sputum  culture- normal oropharyngeal flora; fungal and AFB culture pending UCX NGTD BCX NGTD x 2 FOBT negative, repeat pending FENa- .2%  Dg Chest 2 View  03/27/2013   CLINICAL DATA:  Cough, hemoptysis  EXAM: CHEST  2 VIEW  COMPARISON:  03/23/2013  FINDINGS: Multifocal patchy airspace opacities, most prominent in the right perihilar region, mildly increased. Differential considerations include mild to moderate interstitial edema or possibly multifocal infection.  Suspected small left pleural effusion.  No pneumothorax.  Cardiomegaly.  Surgical clips overlying the bilateral neck.  IMPRESSION: Multifocal patchy airspace opacities, most prominent in the right perihilar region, mildly increased. Differential considerations include mild to moderate interstitial edema or possibly multifocal infection.  Cardiomegaly with suspected small left pleural effusion.   Electronically Signed   By: Julian Hy M.D.   On: 03/27/2013 13:00   Dg Chest 2 View  03/23/2013   CLINICAL DATA:  Chest pain with history of congestive heart failure  EXAM: CHEST  2 VIEW  COMPARISON:  03/20/2013  FINDINGS: Heart size upper normal. Mild vascular congestion has developed when compared to the prior study. There is bilateral perihilar peribronchial cuffing with indistinct bilateral perihilar vessels and mild hazy opacity in the perihilar regions bilaterally. There are no pleural effusions. Surgical clips in the thyroid bed again identified.  IMPRESSION: Findings suggest development of congestive heart failure with mild pulmonary edema.   Electronically Signed   By: Skipper Cliche M.D.   On: 03/23/2013 16:22   Dg Chest 2 View  03/20/2013   CLINICAL DATA:  Chest and back pain.  Hypertension.  EXAM: CHEST  2 VIEW  COMPARISON:  08/19/2011  FINDINGS: The lungs are clear without focal infiltrate, edema, pneumothorax or pleural effusion. Cardiopericardial silhouette is at upper limits of normal for size. Imaged bony structures of the thorax are  intact. Surgical clips are seen in the thyroid bed and left axilla.  IMPRESSION: Stable.  No acute cardiopulmonary process.   Electronically Signed   By: Misty Stanley M.D.   On: 03/20/2013 07:45   Ct Chest Wo Contrast  03/27/2013   CLINICAL DATA:  Chest pain and shortness of breath for several months. Low-grade fever. Acute renal failure in transplant patient.  EXAM: CT CHEST WITHOUT CONTRAST  TECHNIQUE: Multidetector CT imaging of the chest was performed following the standard protocol without IV contrast.  COMPARISON:  Chest radiograph performed earlier today at 11:45 a.m.  FINDINGS: Diffuse fluffy bilateral central airspace opacity is seen, most prominent at the upper lung lobes bilaterally. This is most compatible with diffuse pneumonia. Underlying edema cannot be excluded, given underlying small bilateral pleural effusions and interstitial prominence. No pneumothorax is seen.  Scattered coronary artery calcifications are seen. The heart is mildly enlarged. A mildly prominent right paratracheal node is seen, measuring 1.3 cm in short axis. No pericardial effusion is identified. The great vessels are grossly unremarkable in appearance. The patient is status post thyroidectomy. No axillary lymphadenopathy is seen.  The visualized portions of the liver and the spleen are unremarkable in appearance. Severe bilateral renal atrophy is seen, with scattered calcifications noted bilaterally. A small hiatal hernia is noted.  IMPRESSION: 1. Diffuse fluffy bilateral central airspace opacification, most prominent at the upper lung lobes. This is most compatible with diffuse multifocal pneumonia. Underlying edema cannot be excluded, given small bilateral pleural effusions and interstitial prominence. 2. Scattered coronary calcifications seen. 3. Mild cardiomegaly. 4. Mildly prominent right paratracheal node, measuring 1.3 cm in short axis. 5. Severe bilateral renal atrophy, with scattered associated calcifications. 6.  Small hiatal hernia seen.   Electronically Signed   By: Garald Balding M.D.   On: 03/27/2013 21:48   Nm Bone Scan Whole Body  03/03/2013   CLINICAL DATA:  Pain.  Prostate cancer.  EXAM: NUCLEAR MEDICINE WHOLE BODY BONE SCAN  TECHNIQUE: Whole body anterior and posterior images were obtained approximately 3 hours after intravenous injection of radiopharmaceutical.  COMPARISON:  Bone scan 10/26/2007.  CT 02/28/2013.  RADIOPHARMACEUTICALS:  26.0 mCi Technetium-99 MDP  FINDINGS: Bilateral renal atrophy with a transplanted kidney right pelvis. No focal bony abnormality. No evidence of bony metastatic disease.  IMPRESSION: No evidence of bony metastatic disease or focal bony abnormality. Bilateral renal atrophy. Transplanted kidney right pelvis.   Electronically Signed   By: Marcello Moores  Register   On: 03/03/2013 14:02   US Renal  03/22/2013   CLINICAL DATA:  Acute kidney injury. Patient with bilateral renal transplant.  EXAM: RENAL/URINARY TRACT ULTRASOUND COMPLETE  COMPARISON:  CT, 02/28/2013  FINDINGS: Right Kidney:  Length: 7.4 cm. Kidney is echogenic. 15 mm cyst arises from the upper pole. There are probable additional tiny cysts. No hydronephrosis.  Left Kidney:  Native left kidney not visualized.  Transplant kidney: In the right lower quadrant to right upper pelvis, a transplant kidney is visualized. Is normal in overall size measuring 10.7 cm. It has normal parenchymal echogenicity. Small cyst arises from its lower pole measuring 1 cm. No other renal masses. No hydronephrosis.  Bladder:  Appears normal for degree of bladder distention.  IMPRESSION: 1. Transplant kidney is normal in appearance within a small lower pole cyst measuring 1 cm. 2. Atrophic native kidneys. Only the right was visualized. Is echogenic with at least 1 discrete small cysts. No hydronephrosis.   Electronically Signed   By: Lajean Manes M.D.   On: 03/22/2013 15:50   Nm Myocar Multi W/spect W/wall Motion / Ef  03/22/2013   CLINICAL DATA:   63yo AA man with PMH of HTN, DM2, CAD with stents, CAD, PVD s/p fem pop, prostate cancer treated with radiation, hx of thyroid cancer, h/o ESRD and kidney transplantation who presents for chest pain and back pain  EXAM: MYOCARDIAL IMAGING WITH SPECT (REST)  TECHNIQUE: Standard myocardial SPECT imaging was performed after resting intravenous injection of Tc-39m sestamibi. Quantitative gated imaging was also performed to evaluate left ventricular wall motion, and estimate left ventricular ejection fraction. As the patient's heart rate was 129 bpm when he presented for his "stress" images, no additional administration of Lexiscan was performed.  COMPARISON:  None.  FINDINGS: The Image quality is good. There is mild interference from visceral racer uptake. The left ventricle is moderately to severely dilated. There is a moderate in size, moderate-to-severe in intensity inferior and inferolateral perfusion abnormality seen on both rest and "stress" images, possibly worse on the "stress" images. There is global hypokinesis of the left ventricle, with severe basal and mid inferior wall hypokinesis. EF 34%. The severity of LV dysfunction is out of proportion to the perfusion abnormality.  IMPRESSION: Interpretation of the study is severely limited by absence of a clear increase in coronary flow during acquisition of the "stress" images  There is evidence of severe ischemia at rest and/or scar in the right coronary artery distribution. This appears to be at least partly reversible.  The degree of LV dysfunction suggests multivessel CAD versus a component of superimposed nonischemic cardiomyopathy.  No previous studies are available for comparison   Electronically Signed   By: Sanda Klein   On: 03/22/2013 19:29   Nm Pulmonary Perf And Vent  03/20/2013   CLINICAL DATA:  Chest pain  EXAM: NUCLEAR MEDICINE VENTILATION - PERFUSION LUNG SCAN  Views: Anterior, posterior, left lateral, right lateral, RPO, LPO, RAO, LAO  -ventilation and perfusion  Radionuclide: Technetium 41m DTPA -ventilation; Technetium 14m macroaggregated albumin-perfusion  Dose:  40.0 mCi-ventilation; 6.0 mCi-perfusion  Route of administration: Inhalation -ventilation; intravenous -perfusion  COMPARISON:  Chest radiograph March 20, 2013  FINDINGS: Radiotracer uptake on  the ventilation study is homogeneous and symmetric bilaterally.  Radiotracer uptake on the perfusion study is homogeneous and symmetric bilaterally.  There is no appreciable ventilation/perfusion mismatch.  IMPRESSION: No ventilation or perfusion abnormalities. Very low probability of pulmonary embolus.   Electronically Signed   By: Lowella Grip M.D.   On: 03/20/2013 16:15   2D echo 03/22/2012- Study Conclusions  - Left ventricle: The cavity size was mildly dilated. Wall thickness was normal. Systolic function was mildly reduced. The estimated ejection fraction was in the range of 45% to 50%. There is hypokinesis of the lateral myocardium. Doppler parameters are consistent with restrictive physiology, indicative of decreased left ventricular diastolic compliance and/or increased left atrial pressure. Doppler parameters are consistent with high ventricular filling pressure. - Aortic valve: Valve mobility was restricted. There was mild to moderate stenosis. Trivial regurgitation. Valve area: 1.56cm^2(VTI). Valve area: 1.47cm^2 (Vmax). - Mitral valve: Mild regurgitation. - Left atrium: The atrium was mildly dilated. - Pulmonary arteries: Systolic pressure was mildly increased. PA peak pressure: 98mm Hg (S).  Medications: I have reviewed the patient's current medications. Scheduled Meds: . aspirin  81 mg Oral Daily  . atorvastatin  80 mg Oral q1800  . diltiazem  120 mg Oral Daily  . dronedarone  400 mg Oral BID WC  . feeding supplement (ENSURE COMPLETE)  237 mL Oral TID BM  . insulin aspart  0-9 Units Subcutaneous TID WC  . insulin glargine  28 Units Subcutaneous  Daily  . isosorbide mononitrate  30 mg Oral Daily  . levofloxacin  750 mg Oral Q48H  . levothyroxine  150 mcg Oral Daily  . methocarbamol  500 mg Oral BID  . metoprolol tartrate  50 mg Oral Q6H  . mycophenolate  720 mg Oral BID  . pantoprazole  40 mg Oral Daily  . polyethylene glycol powder  1 Container Oral Once  . regadenoson  0.4 mg Intravenous Once  . sirolimus  2 mg Oral Daily  . sodium bicarbonate  650 mg Oral BID   Continuous Infusions:   PRN Meds:.sodium chloride, oxyCODONE, sodium chloride  Assessment/Plan:  HCAP: Continues to improve.  -today is day 6 of antibiotics-->continue levaquin 750mg  PO q48h on 1/23 (total of 8 day course planned--today is last dose) -PT/OT- recommend HHPT/OT and walker  Acute GI bleed w/ dropping hemoglobin on 03/28/2013: No more diarrhea or BRBPR. -EGD done 1/23 was wnl -colonoscopy 1/23 was incomplete 2/2 ventral hernia--will need barium enema to reassess R colon either tomorrow or as outpatient if patient is discharged -hold heparin, ASA, plavix (1/20 first day without any of these medications)  Chest pain with hx of CAD, dCHF and current atrial tachycardia- No more chest pain overnight. However, patient is still having episodes of atrial tachycardia, though much more well controlled since starting Multaq yesterday. Will need to discuss plan with cardiology today. -diltiazem 120mg  daily (started 1/22) -continue metoprolol tartrate to 50mg  q6h -continue multaq 400mg  BID -cardiology following, appreciate recs -holding ASA, plavix, heparin in setting of GI bleed -Continue cardiac medications: Nitroglycerin, lipitor, metoprolol, IMDUR, diltiazem  Anion Gap Metabolic acidosis: Unclear etiology. Likely 2/2 CKD.  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 Multaq is  known to cause AKI, but also need to be cautions for sirolimus toxicity given diltiazem is known inhibitor of sirolimus metabolism.  -check sirolimus  level today -continue sirolimus 4m daily -renal following, appreciate recs  Metastatic Prostate cancer with back pain: Back pain continues, though seemingly well controlled on oxycodone. -Outpatient uro-oncology work up  HTN: stable -continue IMDUR 365mdaily and metoprolol 5089m6h, diltiazem 120m64mily  DM-II: A1c 7.8 on admission.CBGs stable overnight. -continue lantus 28U daily -SSI sensitive   DVT px: SCDs Diet: carb modified Dispo:  planning for discharge soon, perhaps today, but may need to wait until tomorrow  The patient does have a current PCP (TracSueanne Margarita) and [facility].  The patient does not have transportation limitations that hinder transportation to clinic appointments.  Services Needed at time of discharge: Y = Yes, Blank = No PT:   OT:   RN:   Equipment:   Other:     LOS: 13 days   RachRebecca Eaton 04/02/2013, 1:02 PM

## 2013-04-02 NOTE — Progress Notes (Signed)
Internal Medicine Attending  Date: 04/02/2013  Patient name: Joshua [name] Medical record number: [id number] Date of birth: 1950/09/09 Age: 63 y.o. Gender: male   I saw and evaluated the patient on A.M rounds, and discussed his care with housestaff.  I reviewed the resident's note by Dr. Mechele Claude and I agree with the resident's findings and plans as documented in her note.

## 2013-04-02 NOTE — Progress Notes (Signed)
[facility] Progress Note   Subjective:   Feeling well.  No complaints this am.   Objective:   BP 146/67  Pulse 90  Temp(Src) 98.4 F (36.9 C) (Oral)  Resp 18  Ht 5\' 11"  (1.803 m)  Wt 158 lb 15.2 oz (72.1 kg)  BMI 22.18 kg/m2  SpO2 99%  Intake/Output Summary (Last 24 hours) at 04/02/13 1022 Last data filed at 04/02/13 0431  Gross per 24 hour  Intake   1200 ml  Output    600 ml  Net    600 ml   Physical Exam: Gen: resting in bed. NAD. No asterixis this am. CVS: RRR. 2/6 Systolic murmur. Resp: Diffuse wheezing this am. Abd: soft, nontender, abdomen mildly distended.  Ext: No LE edema.   Imaging: Renal US  03/22/2013   IMPRESSION: 1. Transplant kidney is normal in appearance within a small lower pole cyst measuring 1 cm. 2. Atrophic native kidneys. Only the right was visualized. Is echogenic with at least 1 discrete small cysts. No hydronephrosis.   Electronically Signed   By: Lajean Manes M.D.   On: 03/22/2013 15:50   Labs: BMET  Recent Labs Lab 03/28/13 0620 03/28/13 1040 03/29/13 0352 03/30/13 0316 03/31/13 0500 04/01/13 0918 04/02/13 0615  NA 130* 131* 132* 135* 136* 134* 133*  K 5.3 4.8 4.0 4.3 4.3 3.9 4.2  CL 88* 88* 90* 93* 93* 90* 91*  CO2 19 20 22 25 25 24 24   GLUCOSE 365* 255* 204* 102* 52* 211* 112*  BUN 50* 49* 46* 41* 37* 33* 36*  CREATININE 2.50* 2.53* 2.31* 2.22* 2.10* 2.18* 2.67*  CALCIUM 7.7* 7.9* 7.5* 7.2* 7.4* 7.1* 6.9*  PHOS  --   --   --   --   --  3.2  --    CBC  Recent Labs Lab 03/26/13 1120  03/28/13 0620  03/29/13 0352 03/30/13 0316 03/31/13 0500 04/01/13 0432  WBC 8.3  < > 11.0*  --  10.2 9.6 9.8 8.9  NEUTROABS 6.0  --  8.7*  --  7.7  --   --   --   HGB 7.7*  < > 5.7*  < > 7.2* 7.1* 8.0* 8.2*  HCT 24.8*  < > 18.1*  < > 22.5* 23.3* 25.7* 26.2*  MCV 74.7*  < > 73.0*  --  73.5* 75.9* 75.6* 74.2*  PLT 402*  < > 363  --  318 339 373 454*  < > = values in this interval not displayed.  Medications:     .  aspirin  81 mg Oral Daily  . atorvastatin  80 mg Oral q1800  . diltiazem  120 mg Oral Daily  . dronedarone  400 mg Oral BID WC  . feeding supplement (ENSURE COMPLETE)  237 mL Oral TID BM  . insulin aspart  0-9 Units Subcutaneous TID WC  . insulin glargine  28 Units Subcutaneous Daily  . isosorbide mononitrate  30 mg Oral Daily  . levofloxacin  750 mg Oral Q48H  . levothyroxine  150 mcg Oral Daily  . methocarbamol  500 mg Oral BID  . metoprolol tartrate  50 mg Oral Q6H  . mycophenolate  720 mg Oral BID  . pantoprazole  40 mg Oral Daily  . polyethylene glycol powder  1 Container Oral Once  . regadenoson  0.4 mg Intravenous Once  . sirolimus  2 mg Oral Daily  . sodium bicarbonate  650 mg Oral BID    Assessment/ Plan:  Patient is a 63 y.o. male with a PMHx of HTN, DM-2, CAD s/p stenting of LAD, hx of papillary thyroid cancer, CKD III s/p Renal transplant, PVD, Carotid artery stenosis s/p right carotid endarterectomy, metastatic prostate cancer who was admitted to [facility] on 03/20/2013 for evaluation of chest pain.   1) Chest pain, Hx of CAD  2) Hx of Diastolic CHF  3) Acute on chronic kidney disease s/p history of kidney transplant, baseline creatinine 1.6-1.8 . FENA 0.18.  In setting of Diltiazem use (known inhibitor of Sirolimus metabolism).  Sirolimus level returned elevated at 23.9 confirming suspicion of drug toxicity induced acute renal failure. ? If bump related to colonscopy prep.  Watch closely for sirolimus tox 4) Anion Gap Metabolic acidosis 5) Fever of Unknown origin >>> HCAP 6) Metastatic prostate cancer  7) HTN  8) DM-2  9) Paroxysmal Atrial Tachycardia - Uncontrolled. Now on Dilt and Dronedarone  10) Anemia  11) Hyperkalemia   Plan:  Acute on chronic kidney disease s/p history of kidney transplant, baseline creatinine 1.6-1.8  - Sirolimus level returned markedly elevated at 23.9, confirming suspicion of drug toxicity causing worsening kidney function. - Creatinine bump  from yesterday (now 2.67); Will continue to monitor closely.  - Sirolimus now 2 mg daily.  Patient now back on Diltiazem for rate control of PAT.  Patient also now on Multaq as well.  Will continue to follow levels.  Sirolimus level today.   Fever, hemoptysis, Xray findings of Multifocal pneumonia - HCAP - BK virus, EBV and CMV PCR's negative. - Patient now on Levaquin.  Per primary team.   Anemia - Iron 17, % Saturation of 10; Feraheme and Aranesp given 1/19; s/p Transfusion 1U PRBC's on 1/20. - BRBPR during admission.  - Hb stable @ 8.2 - GI following; EGD done 1/23 (Normal), Colonoscopy (large ventral hernia). - GI recommending Barium enema.   Hyperkalemia & Metabolic Acidosis, AG  - Hyperkalemia resolved.  - Continuing bicarb (now decreased to 650 mg BID).  HPTH - PTH returned mildly elevated at 78.5  Coral Spikes, DO PGY-2, [place] [facility] 04/02/2013, 10:22 AM I have seen and examined this patient and agree with the plan of care seen, eval, examined, patient counseled, discussed with resident. .  Roxy Mastandrea L 04/02/2013, 11:15 AM

## 2013-04-03 ENCOUNTER — Encounter (HOSPITAL_COMMUNITY): Payer: Self-pay | Admitting: Internal Medicine

## 2013-04-03 DIAGNOSIS — N186 End stage renal disease: Secondary | ICD-10-CM | POA: Diagnosis present

## 2013-04-03 DIAGNOSIS — I471 Supraventricular tachycardia, unspecified: Secondary | ICD-10-CM

## 2013-04-03 HISTORY — DX: End stage renal disease: N18.6

## 2013-04-03 LAB — GLUCOSE, CAPILLARY
GLUCOSE-CAPILLARY: 100 mg/dL — AB (ref 70–99)
GLUCOSE-CAPILLARY: 77 mg/dL (ref 70–99)
Glucose-Capillary: 104 mg/dL — ABNORMAL HIGH (ref 70–99)
Glucose-Capillary: 106 mg/dL — ABNORMAL HIGH (ref 70–99)
Glucose-Capillary: 111 mg/dL — ABNORMAL HIGH (ref 70–99)
Glucose-Capillary: 118 mg/dL — ABNORMAL HIGH (ref 70–99)
Glucose-Capillary: 143 mg/dL — ABNORMAL HIGH (ref 70–99)
Glucose-Capillary: 75 mg/dL (ref 70–99)

## 2013-04-03 LAB — BASIC METABOLIC PANEL
BUN: 42 mg/dL — ABNORMAL HIGH (ref 6–23)
CALCIUM: 7 mg/dL — AB (ref 8.4–10.5)
CO2: 22 mEq/L (ref 19–32)
CREATININE: 3.4 mg/dL — AB (ref 0.50–1.35)
Chloride: 89 mEq/L — ABNORMAL LOW (ref 96–112)
GFR, EST AFRICAN AMERICAN: 21 mL/min — AB (ref >90)
GFR, EST NON AFRICAN AMERICAN: 18 mL/min — AB (ref >90)
Glucose, Bld: 135 mg/dL — ABNORMAL HIGH (ref 70–99)
Potassium: 4.3 mEq/L (ref 3.7–5.3)
Sodium: 133 mEq/L — ABNORMAL LOW (ref 137–147)

## 2013-04-03 LAB — CBC
HCT: 22.3 % — ABNORMAL LOW (ref 39.0–52.0)
Hemoglobin: 6.9 g/dL — CL (ref 13.0–17.0)
MCH: 23.4 pg — ABNORMAL LOW (ref 26.0–34.0)
MCHC: 30.9 g/dL (ref 30.0–36.0)
MCV: 75.6 fL — AB (ref 78.0–100.0)
PLATELETS: 310 10*3/uL (ref 150–400)
RBC: 2.95 MIL/uL — ABNORMAL LOW (ref 4.22–5.81)
RDW: 19.4 % — AB (ref 11.5–15.5)
WBC: 10.6 10*3/uL — ABNORMAL HIGH (ref 4.0–10.5)

## 2013-04-03 LAB — PREPARE RBC (CROSSMATCH)

## 2013-04-03 MED ORDER — POLYETHYLENE GLYCOL 3350 17 GM/SCOOP PO POWD
1.0000 | Freq: Once | ORAL | Status: AC
  Administered 2013-04-03: 1 via ORAL
  Filled 2013-04-03 (×3): qty 255

## 2013-04-03 MED ORDER — SIROLIMUS 1 MG PO TABS
1.0000 mg | ORAL_TABLET | Freq: Every day | ORAL | Status: DC
  Administered 2013-04-03 – 2013-04-05 (×3): 1 mg via ORAL
  Filled 2013-04-03 (×3): qty 1

## 2013-04-03 NOTE — Progress Notes (Signed)
Physical Therapy Treatment Patient Details Name: Samiel Peel MRN: [id number] DOB: 10/23/1950 Today's Date: 04/03/2013 Time: 7371-0626 PT Time Calculation (min): 24 min  PT Assessment / Plan / Recommendation  History of Present Illness 63 y.o. male admitted to [facility] on 03/20/13 with PMH of HTN, DM2, CAD with stents, CAD, PVD s/p fem pop, prostate cancer treated with radiation, hx of thyroid cancer, h/o ESRD and kidney transplantation who presents for chest pain and back pain.  The chest pain start 3 days ago at rest, it is left sided, substernal, 8/10 at the start, throbbing and sharp, non radiating, possibly worse with breathing, however, definitely worse with palpation.  The pain happened every few hours.  He was intermittently tachycardic in the ED ranging from the 80s-140.  Cardiology was consulted in the ED.  Patient further reports back pain which has been slowly worsening over past few months.  He has had work up in the outpatient setting with a bone scan and MRI lumbar spine as there was concern for bony metastasis of his prostate cancer.  Found to have HCAP, GIB with acute blood loss s/p blood transfusion, acute on chronic kidney failure, back pain with possible bone mets.     PT Comments   Pt improved to min guard with gait with RW, needs cues for safety with RW and cues for deep breathing.  SpO2 drops to 84% on 2L with activity, requires seated rest x 2 minutes to return to >90%.  Follow Up Recommendations  Home health PT;Supervision for mobility/OOB     Does the patient have the potential to tolerate intense rehabilitation     Barriers to Discharge        Equipment Recommendations  Rolling walker with 5" wheels    Recommendations for Other Services    Frequency Min 3X/week   Progress towards PT Goals Progress towards PT goals: Progressing toward goals  Plan Current plan remains appropriate    Precautions / Restrictions Precautions Precautions: Fall Restrictions Weight  Bearing Restrictions: No   Pertinent Vitals/Pain No c/o pain    Mobility  Bed Mobility Overal bed mobility: Modified Independent General bed mobility comments: uses railing Transfers Overall transfer level: Needs assistance Equipment used: Rolling walker (2 wheeled) Sit to Stand: Min guard General transfer comment: steadying assist due to increased sway.  sit to stand x 10 for LE strengthening and control, pt improved to supervision with repetition Ambulation/Gait Ambulation/Gait assistance: Min guard Ambulation Distance (Feet): 150 Feet Assistive device: Rolling walker (2 wheeled) General Gait Details: pt requires cues for sequencing and safety with RW, close supervion/min guard for balance especially with turns.  cues for deep breathing, spO2 84% with activity on 2L O2, returns to 92% with seated rest and cues for deep breathing    Exercises     PT Diagnosis:    PT Problem List:   PT Treatment Interventions:     PT Goals (current goals can now be found in the care plan section)    Visit Information  Last PT Received On: 04/03/13 Assistance Needed: +1 History of Present Illness: 63 y.o. male admitted to [facility] on 03/20/13 with PMH of HTN, DM2, CAD with stents, CAD, PVD s/p fem pop, prostate cancer treated with radiation, hx of thyroid cancer, h/o ESRD and kidney transplantation who presents for chest pain and back pain.  The chest pain start 3 days ago at rest, it is left sided, substernal, 8/10 at the start, throbbing and sharp, non radiating, possibly worse with breathing, however,  definitely worse with palpation.  The pain happened every few hours.  He was intermittently tachycardic in the ED ranging from the 80s-140.  Cardiology was consulted in the ED.  Patient further reports back pain which has been slowly worsening over past few months.  He has had work up in the outpatient setting with a bone scan and MRI lumbar spine as there was concern for bony metastasis of his prostate  cancer.  Found to have HCAP, GIB with acute blood loss s/p blood transfusion, acute on chronic kidney failure, back pain with possible bone mets.      Subjective Data      Cognition  Cognition Arousal/Alertness: Awake/alert Behavior During Therapy: WFL for tasks assessed/performed Overall Cognitive Status: Within Functional Limits for tasks assessed    Balance     End of Session PT - End of Session Equipment Utilized During Treatment: Gait belt;Oxygen Activity Tolerance: Patient tolerated treatment well Patient left: in bed;with call bell/phone within reach;with family/visitor present Nurse Communication: Mobility status   GP     Tina Gruner 04/03/2013, 12:11 PM

## 2013-04-03 NOTE — Progress Notes (Signed)
Joshua [name]   Subjective:   Feeling well and eager to go home.    Objective:   BP 126/64  Pulse 80  Temp(Src) 98.6 F (37 C) (Oral)  Resp 20  Ht 5\' 11"  (1.803 m)  Wt 159 lb 9.8 oz (72.4 kg)  BMI 22.27 kg/m2  SpO2 96%  Intake/Output Summary (Last 24 hours) at 04/03/13 0645 Last data filed at 04/02/13 2200  Gross per 24 hour  Intake    120 ml  Output      0 ml  Net    120 ml   Physical Exam: Gen: resting in bed. NAD. Mild tremor this am.  CVS: RRR. 2/6 Systolic murmur. Resp: Diffuse wheezing this am. Abd: soft, nontender, abdomen mildly distended.  Ext: No LE edema.   Imaging: Renal US  03/22/2013   IMPRESSION: 1. Transplant kidney is normal in appearance within a small lower pole cyst measuring 1 cm. 2. Atrophic native kidneys. Only the right was visualized. Is echogenic with at least 1 discrete small cysts. No hydronephrosis.   Electronically Signed   By: Lajean Manes M.D.   On: 03/22/2013 15:50   Labs: BMET  Recent Labs Lab 03/28/13 1040 03/29/13 0352 03/30/13 0316 03/31/13 0500 04/01/13 0918 04/02/13 0615 04/03/13 0400  NA 131* 132* 135* 136* 134* 133* 133*  K 4.8 4.0 4.3 4.3 3.9 4.2 4.3  CL 88* 90* 93* 93* 90* 91* 89*  CO2 20 22 25 25 24 24 22   GLUCOSE 255* 204* 102* 52* 211* 112* 135*  BUN 49* 46* 41* 37* 33* 36* 42*  CREATININE 2.53* 2.31* 2.22* 2.10* 2.18* 2.67* 3.40*  CALCIUM 7.9* 7.5* 7.2* 7.4* 7.1* 6.9* 7.0*  PHOS  --   --   --   --  3.2  --   --    CBC  Recent Labs Lab 03/28/13 0620  03/29/13 0352 03/30/13 0316 03/31/13 0500 04/01/13 0432 04/03/13 0400  WBC 11.0*  --  10.2 9.6 9.8 8.9 10.6*  NEUTROABS 8.7*  --  7.7  --   --   --   --   HGB 5.7*  < > 7.2* 7.1* 8.0* 8.2* 6.9*  HCT 18.1*  < > 22.5* 23.3* 25.7* 26.2* 22.3*  MCV 73.0*  --  73.5* 75.9* 75.6* 74.2* 75.6*  PLT 363  --  318 339 373 454* 310  < > = values in this interval not displayed.  Medications:     . aspirin  81 mg Oral Daily  .  atorvastatin  80 mg Oral q1800  . diltiazem  120 mg Oral Daily  . dronedarone  400 mg Oral BID WC  . feeding supplement (ENSURE COMPLETE)  237 mL Oral TID BM  . insulin aspart  0-9 Units Subcutaneous TID WC  . insulin glargine  28 Units Subcutaneous Daily  . isosorbide mononitrate  30 mg Oral Daily  . levofloxacin  750 mg Oral Q48H  . levothyroxine  150 mcg Oral Daily  . methocarbamol  500 mg Oral BID  . metoprolol tartrate  50 mg Oral Q6H  . mycophenolate  720 mg Oral BID  . pantoprazole  40 mg Oral Daily  . polyethylene glycol powder  1 Container Oral Once  . regadenoson  0.4 mg Intravenous Once  . sirolimus  2 mg Oral Daily  . sodium bicarbonate  650 mg Oral BID    Assessment/ Plan:   Patient is a 63 y.o. male with a PMHx of HTN, DM-2,  CAD s/p stenting of LAD, hx of papillary thyroid cancer, CKD III s/p Renal transplant, PVD, Carotid artery stenosis s/p right carotid endarterectomy, metastatic prostate cancer who was admitted to [facility] on 03/20/2013 for evaluation of chest pain.   1) Chest pain, Hx of CAD  2) Hx of Diastolic CHF  3) Acute on chronic kidney disease s/p history of kidney transplant, baseline creatinine 1.6-1.8 . FENA 0.18.  In setting of Diltiazem use (known inhibitor of Sirolimus metabolism).  Sirolimus level returned elevated at 23.9 confirming suspicion of drug toxicity induced acute renal failure. 4) Anion Gap Metabolic acidosis 5) Fever of Unknown origin >>> HCAP; Now completed antibiotic course 6) Prostate cancer  7) HTN  8) DM-2  9) Paroxysmal Atrial Tachycardia - Uncontrolled. Now on Dilt and Dronedarone  10) Anemia & GI bleed.  11) Hyperkalemia   Plan:  Acute on chronic kidney disease s/p history of kidney transplant, baseline creatinine 1.6-1.8  - Creatinine rising again.  Likely secondary to intravascular depletion and patient had an additional drop in Hb today.  ? Sirolimus toxicity again as well. Decreased Sirolimus to 1 mg daily. Multaq also  associated with acute renal failure AND interacts with sirolimus - Awaiting sirolimus level.   Anemia - Iron 17, % Saturation of 10; Feraheme and Aranesp given 1/19; s/p Transfusion 1U PRBC's on 1/20 after GI bleed.  - GI following; EGD done 1/23 (Normal), Colonoscopy (large ventral hernia). - Drop in Hb again today (Hb 6.9). ? Source given negative work up.  - Transfusion today  Hyperkalemia & Metabolic Acidosis, AG  - Hyperkalemia resolved.  - Continuing bicarb (now decreased to 650 mg BID).  HPTH - PTH returned mildly elevated at 78.5 - No need for intervention currently  PAT - Per Cardiology. - Patient now on Dilt and Dronedarone; will discuss with cardiology today Discussed and will hold multaq today, continue dilt at least for now; may need to regroup on the pharmacologic management of his PAT.  Spoke with Dr.McAlhaney.   Joshua Spikes, DO PGY-2, [facility] 04/03/2013, 6:45 AM  I have seen and examined this patient and agree with plan as above with highlighted additions.  Issue now is rising creatinine - intially thought sirolimus toxicity due to imparied sirolimus metabolism with dilt - dilt was held then restarted, sirolimus was reduced.  Now with the addition of multaq (interacts with sirolimus AND is known to cause acute renal failure, esp in face of volume depletion) we are seeing another bump in creatinine past 2 days so blood loss anemia with Hb down to 6.9 may be contributing though no overt hypotension or visible blood loss.  Have spoken with Dr. Julianne Handler and he is in agreement with holding multaq for now, continuing dilt at least in the short run. We have backed off to 1 mg/day on the sirolimus.  Have a level pending from yesterday on 2 mg (but will no doubt take 5-6 days to get that level back. Joshua [name] B,MD 04/03/2013 11:51 AM

## 2013-04-03 NOTE — Progress Notes (Signed)
Internal Medicine Attending  Date: 04/03/2013  Patient name: Joshua [name] Medical record number: [id number] Date of birth: Oct 25, 1950 Age: 63 y.o. Gender: male  I saw and evaluated the patient, and discussed his care on A.M rounds with housestaff.  I reviewed the resident's note by Dr. Mechele Claude and I agree with the resident's findings and plans as documented in her note.

## 2013-04-03 NOTE — Progress Notes (Signed)
Pt has been reminded to drink the prep for his colonoscopy he states that he can't drink the 3 liters. I informed him that the test can't be done if his colon is not cleaned out he verbalized understanding but also stated that he will not be able to drink all of the prep. Arthor Captain LPN

## 2013-04-03 NOTE — Progress Notes (Signed)
Pt unable to undergo BE today.  For unclear reason he never received the Miralax prep yesterday evening and the clear liquid diet for last evening was not initiated.   At this point have asked the resident Dr Angelica Pou to reorder the BE, contact radiology for specific prep/diet instructions for the BE and take care of all BE related orders.   Residents need to take care or these orders as a point of learning.   Am frustrated that the orders I wrote regarding BE have been ignored, cancelled....  Dr Otelia Sergeant assures me that her team will take care of the BE and reorder for tomorrow. I leave this in her capable hands.   Azucena Freed, PA-C [phone]  GI ATTENDING  As above. BE to complete GI workup of anemia and Heme + stool. Call us is for questions or problems. Thanks.  Will sign off.  Docia Chuck. Geri Seminole., M.D. [facility] Division of Gastroenterology

## 2013-04-03 NOTE — Progress Notes (Signed)
Spoke MD on call Internal Medicine to clarify contact information was informed that pt did not need to be on contact precautions. Arthor Captain LPN

## 2013-04-03 NOTE — Progress Notes (Signed)
Subjective: Mr. Maceachern feels well this morning. He has no complaints. No more diarrhea or bloody stools. Cough continues to improve. He is eating and drinking well. Telemetry shows 1 very brief episode of atrial tachycardia w/ scattered PVCs. HR overall is well controlled.   Objective: Vital signs in last 24 hours: Filed Vitals:   04/02/13 1505 04/02/13 1815 04/02/13 2045 04/03/13 0628  BP: 112/66 119/66 132/72 126/64  Pulse: 81 110 87 80  Temp: 98 F (36.7 C)  98.6 F (37 C) 98.6 F (37 C)  TempSrc: Oral  Oral Oral  Resp: $Remo'18  19 20  'GugBu$ Height:      Weight:    159 lb 9.8 oz (72.4 kg)  SpO2: 99%  99% 96%  on room air  Weight change: 10.6 oz (0.3 kg)  Intake/Output Summary (Last 24 hours) at 04/03/13 0745 Last data filed at 04/02/13 2200  Gross per 24 hour  Intake    120 ml  Output      0 ml  Net    120 ml   Physical Exam General: lying comfortably in bed HEENT: [place]/AT, vision grossly intact Cardiac: RRR, harsh systolic ejection murmur loudest over R sternal border Pulm: CTAB today Abd: soft, nontender, nondistended, BS present Ext: warm and well perfused, no pedal edema; many excoriations to BLE  Neuro: alert and oriented X3, cranial nerves II-XII grossly intact, moves all extremities spontaneously  Lab Results: Basic Metabolic Panel:  Recent Labs Lab 04/01/13 0918 04/02/13 0615 04/03/13 0400  NA 134* 133* 133*  K 3.9 4.2 4.3  CL 90* 91* 89*  CO2 $Re'24 24 22  'zHy$ GLUCOSE 211* 112* 135*  BUN 33* 36* 42*  CREATININE 2.18* 2.67* 3.40*  CALCIUM 7.1* 6.9* 7.0*  PHOS 3.2  --   --    Liver Function Tests:  Recent Labs Lab 03/27/13 1300 04/01/13 0918  AST 45*  --   ALT 26  --   ALKPHOS 385*  --   BILITOT 0.3  --   PROT 7.5  --   ALBUMIN 2.1* 2.0*   CBC:  Recent Labs Lab 03/28/13 0620  03/29/13 0352  04/01/13 0432 04/03/13 0400  WBC 11.0*  --  10.2  < > 8.9 10.6*  NEUTROABS 8.7*  --  7.7  --   --   --   HGB 5.7*  < > 7.2*  < > 8.2* 6.9*  HCT 18.1*  < >  22.5*  < > 26.2* 22.3*  MCV 73.0*  --  73.5*  < > 74.2* 75.6*  PLT 363  --  318  < > 454* 310  < > = values in this interval not displayed. Cardiac Enzymes:  Recent Labs Lab 03/27/13 0930 03/27/13 1615 03/27/13 2310  CKTOTAL 99  --   --   CKMB 1.2  --   --   TROPONINI <0.30 <0.30 <0.30   CBG:  Recent Labs Lab 04/02/13 1127 04/02/13 1539 04/02/13 2024 04/03/13 0028 04/03/13 0135 04/03/13 0439  GLUCAP 157* 106* 111* 75 104* 143*   Iron/TIBC/Ferritin    Component Value Date/Time   IRON 17* 03/27/2013 0930   TIBC 166* 03/27/2013 0930   FERRITIN 5064* 03/27/2013 0930   BNP    Component Value Date/Time   PROBNP 19450.0* 03/27/2013 1615   Urinalysis    Component Value Date/Time   COLORURINE YELLOW 03/27/2013 1834   APPEARANCEUR CLOUDY* 03/27/2013 1834   LABSPEC 1.027 03/27/2013 1834   PHURINE 5.0 03/27/2013 1834   GLUCOSEU NEGATIVE  03/27/2013 1834   HGBUR SMALL* 03/27/2013 [place]* 03/27/2013 [place] 03/27/2013 1834   PROTEINUR 100* 03/27/2013 1834   UROBILINOGEN 1.0 03/27/2013 1834   NITRITE NEGATIVE 03/27/2013 1834   LEUKOCYTESUR NEGATIVE 03/27/2013 1834   Micro Results: Recent Results (from the past 240 hour(s))  CULTURE, BLOOD (ROUTINE X 2)     Status: None   Collection Time    03/27/13  5:25 PM      Result Value Range Status   Specimen Description BLOOD RIGHT HAND   Final   Special Requests BOTTLES DRAWN AEROBIC ONLY 10CC   Final   Culture  Setup Time     Final   Value: 03/27/2013 22:06     Performed at [facility]   Culture     Final   Value: NO GROWTH 5 DAYS     Performed at [facility]   Report Status 04/02/2013 FINAL   Final  CULTURE, BLOOD (ROUTINE X 2)     Status: None   Collection Time    03/27/13  5:28 PM      Result Value Range Status   Specimen Description BLOOD RIGHT HAND   Final   Special Requests BOTTLES DRAWN AEROBIC ONLY 10CC   Final   Culture  Setup Time     Final   Value: 03/27/2013 22:05       Performed at [facility]   Culture     Final   Value: NO GROWTH 5 DAYS     Performed at [facility]   Report Status 04/02/2013 FINAL   Final  RESPIRATORY VIRUS PANEL     Status: None   Collection Time    03/27/13  6:14 PM      Result Value Range Status   Source - RVPAN NASAL SWAB   Corrected   Comment: CORRECTED ON 01/20 AT 1804: PREVIOUSLY REPORTED AS NASAL SWAB   Respiratory Syncytial Virus A NOT DETECTED   Final   Respiratory Syncytial Virus B NOT DETECTED   Final   Influenza A NOT DETECTED   Final   Influenza B NOT DETECTED   Final   Parainfluenza 1 NOT DETECTED   Final   Parainfluenza 2 NOT DETECTED   Final   Parainfluenza 3 NOT DETECTED   Final   Metapneumovirus NOT DETECTED   Final   Rhinovirus NOT DETECTED   Final   Adenovirus NOT DETECTED   Final   Influenza A H1 NOT DETECTED   Final   Influenza A H3 NOT DETECTED   Final   Comment: (NOTE)           Normal Reference Range for each Analyte: NOT DETECTED     Testing performed using the Luminex xTAG Respiratory Viral Panel test     kit.     This test was developed and its performance characteristics determined     by [facility]. It has not been cleared or approved by the [place]     Food and Drug Administration. This test is used for clinical purposes.     It should not be regarded as investigational or for research. This     laboratory is certified under the [employer] (CLIA) as qualified to perform high complexity     clinical laboratory testing.     Performed at [place], EXPECTORATED SPUTUM-ASSESSMENT     Status: None  Collection Time    03/27/13  6:34 PM      Result Value Range Status   Specimen Description SPUTUM   Final   Special Requests NONE   Final   Sputum evaluation     Final   Value: THIS SPECIMEN IS ACCEPTABLE. RESPIRATORY CULTURE REPORT TO FOLLOW.   Report Status 03/27/2013 FINAL   Final  URINE CULTURE      Status: None   Collection Time    03/27/13  6:34 PM      Result Value Range Status   Specimen Description URINE, CLEAN CATCH   Final   Special Requests NONE   Final   Culture  Setup Time     Final   Value: 03/28/2013 01:05     Performed at [place]     Final   Value: NO GROWTH     Performed at [facility]   Culture     Final   Value: NO GROWTH     Performed at [facility]   Report Status 03/28/2013 FINAL   Final  CULTURE, RESPIRATORY (NON-EXPECTORATED)     Status: None   Collection Time    03/27/13  6:34 PM      Result Value Range Status   Specimen Description SPUTUM   Final   Special Requests NONE   Final   Gram Stain     Final   Value: RARE WBC PRESENT,BOTH PMN AND MONONUCLEAR     NO SQUAMOUS EPITHELIAL CELLS SEEN     RARE GRAM POSITIVE COCCI IN PAIRS     RARE GRAM POSITIVE RODS     Performed at [facility]   Culture     Final   Value: NORMAL OROPHARYNGEAL FLORA     Performed at [facility]   Report Status 03/30/2013 FINAL   Final  MRSA PCR SCREENING     Status: None   Collection Time    03/28/13 10:42 PM      Result Value Range Status   MRSA by PCR NEGATIVE  NEGATIVE Final   Comment:            The GeneXpert MRSA Assay (FDA     approved for NASAL specimens     only), is one component of a     comprehensive MRSA colonization     surveillance program. It is not     intended to diagnose MRSA     infection nor to guide or     monitor treatment for     MRSA infections.    MISC: sirolimus level 23.9 (high) quantiferon negative PTH 78.5 (H) CMV pcr negative EBV pcr negative BK virus pcr negative Respiratory virus panel negative Pneumocystis smear pending Aspergillus antigen pending Sputum culture- normal oropharyngeal flora; fungal and AFB culture pending UCX NGTD BCX NGTD x 2 FOBT negative, repeat pending FENa- .2%  Dg Chest 2 View  03/27/2013   CLINICAL DATA:  Cough, hemoptysis  EXAM: CHEST   2 VIEW  COMPARISON:  03/23/2013  FINDINGS: Multifocal patchy airspace opacities, most prominent in the right perihilar region, mildly increased. Differential considerations include mild to moderate interstitial edema or possibly multifocal infection.  Suspected small left pleural effusion.  No pneumothorax.  Cardiomegaly.  Surgical clips overlying the bilateral neck.  IMPRESSION: Multifocal patchy airspace opacities, most prominent in the right perihilar region, mildly increased. Differential considerations include mild to moderate interstitial edema or possibly multifocal infection.  Cardiomegaly with suspected  small left pleural effusion.   Electronically Signed   By: Charline Bills M.D.   On: 03/27/2013 13:00   Dg Chest 2 View  03/23/2013   CLINICAL DATA:  Chest pain with history of congestive heart failure  EXAM: CHEST  2 VIEW  COMPARISON:  03/20/2013  FINDINGS: Heart size upper normal. Mild vascular congestion has developed when compared to the prior study. There is bilateral perihilar peribronchial cuffing with indistinct bilateral perihilar vessels and mild hazy opacity in the perihilar regions bilaterally. There are no pleural effusions. Surgical clips in the thyroid bed again identified.  IMPRESSION: Findings suggest development of congestive heart failure with mild pulmonary edema.   Electronically Signed   By: Esperanza Heir M.D.   On: 03/23/2013 16:22   Dg Chest 2 View  03/20/2013   CLINICAL DATA:  Chest and back pain.  Hypertension.  EXAM: CHEST  2 VIEW  COMPARISON:  08/19/2011  FINDINGS: The lungs are clear without focal infiltrate, edema, pneumothorax or pleural effusion. Cardiopericardial silhouette is at upper limits of normal for size. Imaged bony structures of the thorax are intact. Surgical clips are seen in the thyroid bed and left axilla.  IMPRESSION: Stable.  No acute cardiopulmonary process.   Electronically Signed   By: Kennith Center M.D.   On: 03/20/2013 07:45   Ct Chest Wo  Contrast  03/27/2013   CLINICAL DATA:  Chest pain and shortness of breath for several months. Low-grade fever. Acute renal failure in transplant patient.  EXAM: CT CHEST WITHOUT CONTRAST  TECHNIQUE: Multidetector CT imaging of the chest was performed following the standard protocol without IV contrast.  COMPARISON:  Chest radiograph performed earlier today at 11:45 a.m.  FINDINGS: Diffuse fluffy bilateral central airspace opacity is seen, most prominent at the upper lung lobes bilaterally. This is most compatible with diffuse pneumonia. Underlying edema cannot be excluded, given underlying small bilateral pleural effusions and interstitial prominence. No pneumothorax is seen.  Scattered coronary artery calcifications are seen. The heart is mildly enlarged. A mildly prominent right paratracheal node is seen, measuring 1.3 cm in short axis. No pericardial effusion is identified. The great vessels are grossly unremarkable in appearance. The patient is status post thyroidectomy. No axillary lymphadenopathy is seen.  The visualized portions of the liver and the spleen are unremarkable in appearance. Severe bilateral renal atrophy is seen, with scattered calcifications noted bilaterally. A small hiatal hernia is noted.  IMPRESSION: 1. Diffuse fluffy bilateral central airspace opacification, most prominent at the upper lung lobes. This is most compatible with diffuse multifocal pneumonia. Underlying edema cannot be excluded, given small bilateral pleural effusions and interstitial prominence. 2. Scattered coronary calcifications seen. 3. Mild cardiomegaly. 4. Mildly prominent right paratracheal node, measuring 1.3 cm in short axis. 5. Severe bilateral renal atrophy, with scattered associated calcifications. 6. Small hiatal hernia seen.   Electronically Signed   By: Roanna Raider M.D.   On: 03/27/2013 21:48   Nm Bone Scan Whole Body  03/03/2013   CLINICAL DATA:  Pain.  Prostate cancer.  EXAM: NUCLEAR MEDICINE WHOLE  BODY BONE SCAN  TECHNIQUE: Whole body anterior and posterior images were obtained approximately 3 hours after intravenous injection of radiopharmaceutical.  COMPARISON:  Bone scan 10/26/2007.  CT 02/28/2013.  RADIOPHARMACEUTICALS:  26.0 mCi Technetium-99 MDP  FINDINGS: Bilateral renal atrophy with a transplanted kidney right pelvis. No focal bony abnormality. No evidence of bony metastatic disease.  IMPRESSION: No evidence of bony metastatic disease or focal bony abnormality. Bilateral renal atrophy. Transplanted  kidney right pelvis.   Electronically Signed   By: Marcello Moores  Register   On: 03/03/2013 14:02   US Renal  03/22/2013   CLINICAL DATA:  Acute kidney injury. Patient with bilateral renal transplant.  EXAM: RENAL/URINARY TRACT ULTRASOUND COMPLETE  COMPARISON:  CT, 02/28/2013  FINDINGS: Right Kidney:  Length: 7.4 cm. Kidney is echogenic. 15 mm cyst arises from the upper pole. There are probable additional tiny cysts. No hydronephrosis.  Left Kidney:  Native left kidney not visualized.  Transplant kidney: In the right lower quadrant to right upper pelvis, a transplant kidney is visualized. Is normal in overall size measuring 10.7 cm. It has normal parenchymal echogenicity. Small cyst arises from its lower pole measuring 1 cm. No other renal masses. No hydronephrosis.  Bladder:  Appears normal for degree of bladder distention.  IMPRESSION: 1. Transplant kidney is normal in appearance within a small lower pole cyst measuring 1 cm. 2. Atrophic native kidneys. Only the right was visualized. Is echogenic with at least 1 discrete small cysts. No hydronephrosis.   Electronically Signed   By: Lajean Manes M.D.   On: 03/22/2013 15:50   Nm Myocar Multi W/spect W/wall Motion / Ef  03/22/2013   CLINICAL DATA:  63yo AA man with PMH of HTN, DM2, CAD with stents, CAD, PVD s/p fem pop, prostate cancer treated with radiation, hx of thyroid cancer, h/o ESRD and kidney transplantation who presents for chest pain and back pain   EXAM: MYOCARDIAL IMAGING WITH SPECT (REST)  TECHNIQUE: Standard myocardial SPECT imaging was performed after resting intravenous injection of Tc-39m sestamibi. Quantitative gated imaging was also performed to evaluate left ventricular wall motion, and estimate left ventricular ejection fraction. As the patient's heart rate was 129 bpm when he presented for his "stress" images, no additional administration of Lexiscan was performed.  COMPARISON:  None.  FINDINGS: The Image quality is good. There is mild interference from visceral racer uptake. The left ventricle is moderately to severely dilated. There is a moderate in size, moderate-to-severe in intensity inferior and inferolateral perfusion abnormality seen on both rest and "stress" images, possibly worse on the "stress" images. There is global hypokinesis of the left ventricle, with severe basal and mid inferior wall hypokinesis. EF 34%. The severity of LV dysfunction is out of proportion to the perfusion abnormality.  IMPRESSION: Interpretation of the study is severely limited by absence of a clear increase in coronary flow during acquisition of the "stress" images  There is evidence of severe ischemia at rest and/or scar in the right coronary artery distribution. This appears to be at least partly reversible.  The degree of LV dysfunction suggests multivessel CAD versus a component of superimposed nonischemic cardiomyopathy.  No previous studies are available for comparison   Electronically Signed   By: Sanda Klein   On: 03/22/2013 19:29   Nm Pulmonary Perf And Vent  03/20/2013   CLINICAL DATA:  Chest pain  EXAM: NUCLEAR MEDICINE VENTILATION - PERFUSION LUNG SCAN  Views: Anterior, posterior, left lateral, right lateral, RPO, LPO, RAO, LAO -ventilation and perfusion  Radionuclide: Technetium 79m DTPA -ventilation; Technetium 64m macroaggregated albumin-perfusion  Dose:  40.0 mCi-ventilation; 6.0 mCi-perfusion  Route of administration: Inhalation  -ventilation; intravenous -perfusion  COMPARISON:  Chest radiograph March 20, 2013  FINDINGS: Radiotracer uptake on the ventilation study is homogeneous and symmetric bilaterally.  Radiotracer uptake on the perfusion study is homogeneous and symmetric bilaterally.  There is no appreciable ventilation/perfusion mismatch.  IMPRESSION: No ventilation or perfusion abnormalities. Very low  probability of pulmonary embolus.   Electronically Signed   By: Lowella Grip M.D.   On: 03/20/2013 16:15   2D echo 03/22/2012- Study Conclusions  - Left ventricle: The cavity size was mildly dilated. Wall thickness was normal. Systolic function was mildly reduced. The estimated ejection fraction was in the range of 45% to 50%. There is hypokinesis of the lateral myocardium. Doppler parameters are consistent with restrictive physiology, indicative of decreased left ventricular diastolic compliance and/or increased left atrial pressure. Doppler parameters are consistent with high ventricular filling pressure. - Aortic valve: Valve mobility was restricted. There was mild to moderate stenosis. Trivial regurgitation. Valve area: 1.56cm^2(VTI). Valve area: 1.47cm^2 (Vmax). - Mitral valve: Mild regurgitation. - Left atrium: The atrium was mildly dilated. - Pulmonary arteries: Systolic pressure was mildly increased. PA peak pressure: 38mm Hg (S).  Medications: I have reviewed the patient's current medications. Scheduled Meds: . aspirin  81 mg Oral Daily  . atorvastatin  80 mg Oral q1800  . diltiazem  120 mg Oral Daily  . dronedarone  400 mg Oral BID WC  . feeding supplement (ENSURE COMPLETE)  237 mL Oral TID BM  . insulin aspart  0-9 Units Subcutaneous TID WC  . insulin glargine  28 Units Subcutaneous Daily  . isosorbide mononitrate  30 mg Oral Daily  . levofloxacin  750 mg Oral Q48H  . levothyroxine  150 mcg Oral Daily  . methocarbamol  500 mg Oral BID  . metoprolol tartrate  50 mg Oral Q6H  .  mycophenolate  720 mg Oral BID  . pantoprazole  40 mg Oral Daily  . polyethylene glycol powder  1 Container Oral Once  . regadenoson  0.4 mg Intravenous Once  . sirolimus  2 mg Oral Daily  . sodium bicarbonate  650 mg Oral BID   Continuous Infusions:   PRN Meds:.sodium chloride, oxyCODONE, sodium chloride  Assessment/Plan:  HCAP: Continues to improve.  -today is day 7 of antibiotics-->continue levaquin 750mg  PO q48h (today is last dose given q48h dosing- total 8 day course)  -PT/OT- recommend HHPT/OT and walker  Acute GI bleed w/ dropping hemoglobin on 03/28/2013: No more diarrhea or BRBPR. However, patient's Hb dropped from 8.2 on 1/24 to 6.9 1/26. Likely occult bleed. Will need to continue with barium enema, though prep did not occur given miscommunication with GI, therefore will keep on CLD today and NPO starting midnight tonight. Will give prep today. Will hold lantus tonight. -f/u post transfusion CBC -barium enema to reassess R colon tomorrow -continue to hold heparin, ASA, plavix (1/20 first day without any of these medications)  Chest pain with hx of CAD, dCHF and current atrial tachycardia- No more chest pain overnight. Atrial tachycardia more well controlled today. Cardiology wrote in their note to consider changing lopressor to 100mg  BID at discharge. Plan to continue multaq for now. 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 Likely sirolimus toxicity vs multaq induced AKI (also interacts with sirolimus) vs prerenal given poor po intake. -emphasized importance of eating and drinking well  -f/u  sirolimus level  -holding multaq today - renal decreased sirolimus to 1mg  daily from 2mg  daily today -renal following, appreciate recs  Anion Gap Metabolic  acidosis: Unclear etiology. Likely 2/2 CKD.  -encourage eating, continue ensure TID as per nutrition  -continue bicarb $RemoveBeforeDE'650mg'[id number]$  BID  Metastatic Prostate cancer with back pain: Back pain continues, though seemingly well controlled on oxycodone. -Outpatient uro-oncology work up  HTN: stable -continue IMDUR $RemoveBeforeD'30mg'[id number]$  daily and metoprolol $RemoveBefore'50mg'[id number]$  q6h, diltiazem $RemoveBefore'120mg'[id number]$  daily  DM-II: A1c 7.8 on admission.CBGs stable overnight. -holding lantus tonight, then will restart tomorrow at 24U qHS -SSI sensitive   DVT px: SCDs Diet: CLD, NPO at midnight Dispo:  planning for discharge once pt stabilizes medically   The patient does have a current PCP (Sueanne Margarita, MD) and does not need an [facility] hospital follow-up appointment after discharge.  The patient does not have transportation limitations that hinder transportation to clinic appointments.  Services Needed at time of discharge: Y = Yes, Blank = No PT:   OT:   RN:   Equipment:   Other:     LOS: 14 days   Rebecca Eaton, MD 04/03/2013, 7:45 AM

## 2013-04-03 NOTE — Progress Notes (Signed)
Placed back to Isolation ,airborne  As instructed by ID  R/t AFB smear , fungal  pneumocytis smears to be completed as ordered . PLaced a call to MD . Family/ PT informed

## 2013-04-03 NOTE — Progress Notes (Addendum)
Subjective:  No chest pain, up in chair, appears comfortable.  Objective:  Vital Signs in the last 24 hours: Temp:  [98 F (36.7 C)-98.6 F (37 C)] 98.6 F (37 C) (01/26 5809) Pulse Rate:  [80-110] 80 (01/26 0628) Resp:  [18-20] 20 (01/26 0628) BP: (112-132)/(64-72) 126/64 mmHg (01/26 0628) SpO2:  [96 %-99 %] 96 % (01/26 0628) Weight:  [159 lb 9.8 oz (72.4 kg)] 159 lb 9.8 oz (72.4 kg) (01/26 0628)  Intake/Output from previous day:  Intake/Output Summary (Last 24 hours) at 04/03/13 0908 Last data filed at 04/03/13 0841  Gross per 24 hour  Intake    120 ml  Output    200 ml  Net    -80 ml    Physical Exam: General appearance: alert, cooperative and no distress Lungs: decreased breath sounds Heart: regular rate and rhythm   Rate: 78  Rhythm: normal sinus rhythm and PVCs last 24 hrs, no PSVT seen.   Lab Results:  Recent Labs  04/01/13 0432 04/03/13 0400  WBC 8.9 10.6*  HGB 8.2* 6.9*  PLT 454* 310    Recent Labs  04/02/13 0615 04/03/13 0400  NA 133* 133*  K 4.2 4.3  CL 91* 89*  CO2 24 22  GLUCOSE 112* 135*  BUN 36* 42*  CREATININE 2.67* 3.40*   No results found for this basename: TROPONINI, CK, MB,  in the last 72 hours No results found for this basename: INR,  in the last 72 hours  . aspirin  81 mg Oral Daily  . atorvastatin  80 mg Oral q1800  . diltiazem  120 mg Oral Daily  . dronedarone  400 mg Oral BID WC  . feeding supplement (ENSURE COMPLETE)  237 mL Oral TID BM  . insulin aspart  0-9 Units Subcutaneous TID WC  . insulin glargine  28 Units Subcutaneous Daily  . isosorbide mononitrate  30 mg Oral Daily  . levofloxacin  750 mg Oral Q48H  . levothyroxine  150 mcg Oral Daily  . methocarbamol  500 mg Oral BID  . metoprolol tartrate  50 mg Oral Q6H  . mycophenolate  720 mg Oral BID  . pantoprazole  40 mg Oral Daily  . polyethylene glycol powder  1 Container Oral Once  . regadenoson  0.4 mg Intravenous Once  . sirolimus  1 mg Oral Daily  . 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 Multaq added 1/24. We may be able to DC Diltiazem at some point. Will check EKG QTc on Multaq, Levaquin.   Kerin Ransom PA-C Beeper [phone] 04/03/2013, 9:08 AM  I have personally seen and examined this patient with Kerin Ransom PA-C. I agree with the assessment and plan as outlined above. There are no plans  currently for invasive ischemia evaluation. He has no recurrent chest pain. Atrial tachycardia controlled with addition of dronedarone but renal function is now worsened. I have discussed the case with Dr. Lorrene Reid. The literature dose suggest that dronedarone can cause renal dysfunction. He is on immunosuppressants s/p kidney transplant. Will stop dronedarone toda.  Continue metoprolol and Diltiazem. EKG this am. More anemic this am. Plans for transfusion this am. I will ask EP to see him tomorrow. Dr. Caryl Comes saw him as a consult 03/22/13 and suggested dronedarone. Will need to consider other treatment options in regards to his atrial tachycardia.   MCALHANY,CHRISTOPHER 10:29 AM 04/03/2013

## 2013-04-04 ENCOUNTER — Inpatient Hospital Stay (HOSPITAL_COMMUNITY): Payer: Medicare Other

## 2013-04-04 ENCOUNTER — Encounter (HOSPITAL_COMMUNITY): Payer: Self-pay | Admitting: *Deleted

## 2013-04-04 ENCOUNTER — Other Ambulatory Visit: Payer: Self-pay | Admitting: Cardiology

## 2013-04-04 LAB — TYPE AND SCREEN
ABO/RH(D): O POS
Antibody Screen: NEGATIVE
UNIT DIVISION: 0

## 2013-04-04 LAB — BASIC METABOLIC PANEL
BUN: 47 mg/dL — AB (ref 6–23)
CALCIUM: 7.2 mg/dL — AB (ref 8.4–10.5)
CO2: 23 mEq/L (ref 19–32)
CREATININE: 3.68 mg/dL — AB (ref 0.50–1.35)
Chloride: 86 mEq/L — ABNORMAL LOW (ref 96–112)
GFR, EST AFRICAN AMERICAN: 19 mL/min — AB (ref >90)
GFR, EST NON AFRICAN AMERICAN: 16 mL/min — AB (ref >90)
Glucose, Bld: 98 mg/dL (ref 70–99)
Potassium: 4.2 mEq/L (ref 3.7–5.3)
Sodium: 131 mEq/L — ABNORMAL LOW (ref 137–147)

## 2013-04-04 LAB — CBC
HCT: 26.4 % — ABNORMAL LOW (ref 39.0–52.0)
HCT: 27.1 % — ABNORMAL LOW (ref 39.0–52.0)
HEMOGLOBIN: 8.3 g/dL — AB (ref 13.0–17.0)
Hemoglobin: 8.7 g/dL — ABNORMAL LOW (ref 13.0–17.0)
MCH: 23.9 pg — ABNORMAL LOW (ref 26.0–34.0)
MCH: 23.4 pg — AB (ref 26.0–34.0)
MCHC: 31.4 g/dL (ref 30.0–36.0)
MCHC: 32.1 g/dL (ref 30.0–36.0)
MCV: 74.5 fL — ABNORMAL LOW (ref 78.0–100.0)
MCV: 74.6 fL — ABNORMAL LOW (ref 78.0–100.0)
PLATELETS: 375 10*3/uL (ref 150–400)
Platelets: 314 10*3/uL (ref 150–400)
RBC: 3.64 MIL/uL — ABNORMAL LOW (ref 4.22–5.81)
RBC: 3.54 MIL/uL — AB (ref 4.22–5.81)
RDW: 18.9 % — ABNORMAL HIGH (ref 11.5–15.5)
RDW: 19 % — ABNORMAL HIGH (ref 11.5–15.5)
WBC: 9.3 10*3/uL (ref 4.0–10.5)
WBC: 9.8 10*3/uL (ref 4.0–10.5)

## 2013-04-04 LAB — GLUCOSE, CAPILLARY
GLUCOSE-CAPILLARY: 97 mg/dL (ref 70–99)
GLUCOSE-CAPILLARY: 96 mg/dL (ref 70–99)
Glucose-Capillary: 106 mg/dL — ABNORMAL HIGH (ref 70–99)
Glucose-Capillary: 90 mg/dL (ref 70–99)
Glucose-Capillary: 91 mg/dL (ref 70–99)
Glucose-Capillary: 122 mg/dL — ABNORMAL HIGH (ref 70–99)

## 2013-04-04 LAB — SIROLIMUS LEVEL: SIROLIMUS (RAPAMYCIN): 9.4 ug/L (ref 3.0–18.0)

## 2013-04-04 MED ORDER — INSULIN GLARGINE 100 UNIT/ML ~~LOC~~ SOLN
24.0000 [IU] | Freq: Every day | SUBCUTANEOUS | Status: DC
  Filled 2013-04-04 (×2): qty 0.24

## 2013-04-04 NOTE — Progress Notes (Signed)
IV team unable to successfully restart IV. 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 Dr. Marinda Elk on unit and made aware that patient did not have IV at this time. Dr. Marinda Elk to speak with Nephrology regarding IV. Dr. Marinda Elk states to leave IV out at this time and he will get back with me after discussion with renal doctor. Number provided.

## 2013-04-04 NOTE — Progress Notes (Signed)
PT Cancellation Note  Patient Details Name: Joshua [name] MRN: [id number] DOB: [birth year]   Cancelled Treatment:    Reason Eval/Treat Not Completed:  (Pt refused.  Went for barium enema and was there 3 hours.)   INGOLD,Deoni Cosey 04/04/2013, 3:52 PM  [facility] [phone] (pager)

## 2013-04-04 NOTE — Progress Notes (Signed)
SUBJECTIVE: The patient is doing well today.  At this time, he denies chest pain, shortness of breath, or any new concerns.  He is anxious to go home. He is minimally symptomatic with tachycardia (which is improved since yesterday)  Per notes, Multaq stopped yesterday due to renal function, but still active on med list this morning.   Patient also for continued GI work up today for anemia  CURRENT MEDICATIONS: . aspirin  81 mg Oral Daily  . atorvastatin  80 mg Oral q1800  . diltiazem  120 mg Oral Daily  . feeding supplement (ENSURE COMPLETE)  237 mL Oral TID BM  . insulin aspart  0-9 Units Subcutaneous TID WC  . isosorbide mononitrate  30 mg Oral Daily  . levofloxacin  750 mg Oral Q48H  . levothyroxine  150 mcg Oral Daily  . methocarbamol  500 mg Oral BID  . metoprolol tartrate  50 mg Oral Q6H  . mycophenolate  720 mg Oral BID  . pantoprazole  40 mg Oral Daily  . polyethylene glycol powder  1 Container Oral Once  . regadenoson  0.4 mg Intravenous Once  . sirolimus  1 mg Oral Daily  . sodium bicarbonate  650 mg Oral BID      OBJECTIVE: Physical Exam: Filed Vitals:   04/03/13 1600 04/03/13 2326 04/04/13 0551 04/04/13 1132  BP: 125/60 126/63 150/50 129/42  Pulse: 75 74 77 76  Temp: 98.5 F (36.9 C)  97.8 F (36.6 C)   TempSrc: Oral  Oral   Resp: 16  18   Height:      Weight:   163 lb 14.9 oz (74.36 kg)   SpO2: 97%  100%     Intake/Output Summary (Last 24 hours) at 04/04/13 1140 Last data filed at 04/04/13 0914  Gross per 24 hour  Intake    375 ml  Output      0 ml  Net    375 ml    Telemetry reveals sinus rhythm with PAC's, intermittent atrial tach  GEN- The patient is chronically ill appearing, alert and oriented x 3 today.   Head- normocephalic, atraumatic Eyes-  Sclera clear, conjunctiva pink Ears- hearing intact Oropharynx- clear Neck- supple,  Lungs- Clear to ausculation bilaterally, normal work of breathing Heart- Regular rate and rhythm  GI- soft,  NT, ND, + BS Extremities- no clubbing, cyanosis, or edema   LABS: Basic Metabolic Panel:  Recent Labs  04/03/13 0400 04/04/13 0608  NA 133* 131*  K 4.3 4.2  CL 89* 86*  CO2 22 23  GLUCOSE 135* 98  BUN 42* 47*  CREATININE 3.40* 3.68*  CALCIUM 7.0* 7.2*   Liver Function Tests: No results found for this basename: AST, ALT, ALKPHOS, BILITOT, PROT, ALBUMIN,  in the last 72 hours CBC:  Recent Labs  04/03/13 0400 04/04/13 0608  WBC 10.6* 9.8  HGB 6.9* 8.7*  HCT 22.3* 27.1*  MCV 75.6* 74.5*  PLT 310 375     ASSESSMENT AND PLAN:  Principal Problem:   Unstable angina Active Problems:   Occlusion and stenosis of carotid artery without mention of cerebral infarction   Coronary artery disease- medical Rx, no cath secondary to renal transplant   Hypertension   Dyslipidemia, goal LDL below 70   Hypothyroidism   Prostate cancer, 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 Atrial tachycardia The patient has minimally symptomatic recurrent long RP SVT.  This is likely an atrial tachycardia. He appears reasonably controlled presently with metoprolol and diltiazem.  I would recommend consolidation of metoprolol to 100mg  BID once he is more clinically stable. I agree with stopping multaq.  We could consider amiodarone however I would reserve this for more frequent and sustained episodes of tachycardia.  He is a poor candidate for EP procedures presently. As his rates during SVT are now controlled and he is minimally symptomatic, I think that it would be reasonable to discontinue telemetry and treat for symptomatic episodes should they arise.  EP to see as needed while here. Please call with questions.

## 2013-04-04 NOTE — Progress Notes (Signed)
NUTRITION FOLLOW UP  Intervention:    Continue Ensure Complete PO TID, each supplement provides an additional 350 kcals and 13 grams protein per 8 fl oz. Bottle.  Provide Resource Breeze TID if/when pt restricted to clear liquids  Nutrition Dx:   Inadequate oral intake related to chronic illness as evidenced by weight loss and decreased intake. Ongoing.  Goal:   Intake to meet >90% of estimated nutrition needs. Unmet, progressing.  Monitor:   PO intake, labs, weight trend.  Assessment:   Patient with PMH of HTN, DM-II, CAD, hx of paillary thyroid cancer, kidney transplantation, carotid artery stenosis, PVD, and prostate cancer. Patient presents with chest and back pain. 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  Pt NPO and out of room for procedure at time of visit. Per MD note 1/26 pt is eating a drinking well. Pt has been NPO or on clear liquid diet for the majority of this past week; therefore, he has not been receiving Ensure Complete regularly. Pt's weight has decreased 8 lbs in the past 2 weeks.  Labs: low sodium, low chloride, high BUN, high creatinine, low calcium, low GFR, low hemoglobin  Height: Ht Readings from Last 1 Encounters:  03/30/13 5\' 11"  (1.803 m)    Weight Status:   Wt Readings from Last 1 Encounters:  04/04/13 163 lb 14.9 oz (74.36 kg)  03/21/13  171 lb 14.4 oz (77.973 kg)   Re-estimated needs:  Kcal: 2400-2600  Protein: 100-110 grams  Fluid: 1.2 L  Skin: no wounds; +1 RLE and LLE edema  Diet Order: Renal with Ensure Complete PO TID   Intake/Output Summary (Last 24 hours) at 04/04/13 1425 Last data filed at 04/04/13 1216  Gross per 24 hour  Intake      0 ml  Output      1 ml  Net     -1 ml    Last BM: 1/26   Labs:   Recent Labs Lab 04/01/13 0918 04/02/13 0615 04/03/13 0400 04/04/13 0608  NA 134* 133* 133* 131*  K 3.9 4.2 4.3 4.2  CL 90* 91* 89* 86*  CO2 24 24 22 23   BUN 33* 36*  42* 47*  CREATININE 2.18* 2.67* 3.40* 3.68*  CALCIUM 7.1* 6.9* 7.0* 7.2*  PHOS 3.2  --   --   --   GLUCOSE 211* 112* 135* 98    CBG (last 3)   Recent Labs  04/04/13 0107 04/04/13 0401 04/04/13 1131  GLUCAP 106* 97 96    Scheduled Meds: . aspirin  81 mg Oral Daily  . atorvastatin  80 mg Oral q1800  . diltiazem  120 mg Oral Daily  . feeding supplement (ENSURE COMPLETE)  237 mL Oral TID BM  . insulin aspart  0-9 Units Subcutaneous TID WC  . insulin glargine  24 Units Subcutaneous QHS  . isosorbide mononitrate  30 mg Oral Daily  . levofloxacin  750 mg Oral Q48H  . levothyroxine  150 mcg Oral Daily  . methocarbamol  500 mg Oral BID  . metoprolol tartrate  50 mg Oral Q6H  . mycophenolate  720 mg Oral BID  . pantoprazole  40 mg Oral Daily  . polyethylene glycol powder  1 Container Oral Once  . regadenoson  0.4 mg Intravenous Once  . sirolimus  1 mg Oral Daily  . sodium bicarbonate  650 mg Oral BID    Continuous Infusions: none  Pryor Ochoa RD, LDN Inpatient Clinical Dietitian  Pager: [phone] After Hours Pager: [phone]

## 2013-04-04 NOTE — Progress Notes (Signed)
Subjective: Joshua [name] feels well this morning. He had some back pain overnight, though this resolved shortly after receiving oxycodone. Patient has been doing his prep for the BE today. Telemetry shows no episodes of atrial tachycardia overnight.  Objective: Vital signs in last 24 hours: Filed Vitals:   04/03/13 1600 04/03/13 2326 04/04/13 0551 04/04/13 1132  BP: 125/60 126/63 150/50 129/42  Pulse: 75 74 77 76  Temp: 98.5 F (36.9 C)  97.8 F (36.6 C)   TempSrc: Oral  Oral   Resp: 16  18   Height:      Weight:   163 lb 14.9 oz (74.36 kg)   SpO2: 97%  100%   on room air  Weight change: 4 lb 5.1 oz (1.96 kg)  Intake/Output Summary (Last 24 hours) at 04/04/13 1304 Last data filed at 04/04/13 1216  Gross per 24 hour  Intake    375 ml  Output      1 ml  Net    374 ml   Physical Exam General: lying comfortably in bed HEENT: [place]/AT, vision grossly intact Cardiac: RRR, harsh systolic ejection murmur loudest over R sternal border Pulm: CTAB today Abd: soft, nontender, nondistended, BS present Ext: warm and well perfused, no pedal edema; many excoriations to BLE Neuro: alert and oriented X3, cranial nerves II-XII grossly intact, moves all extremities spontaneously  Lab Results: Basic Metabolic Panel:  Recent Labs Lab 04/01/13 0918  04/03/13 0400 04/04/13 0608  NA 134*  < > 133* 131*  K 3.9  < > 4.3 4.2  CL 90*  < > 89* 86*  CO2 24  < > 22 23  GLUCOSE 211*  < > 135* 98  BUN 33*  < > 42* 47*  CREATININE 2.18*  < > 3.40* 3.68*  CALCIUM 7.1*  < > 7.0* 7.2*  PHOS 3.2  --   --   --   < > = values in this interval not displayed. Liver Function Tests:  Recent Labs Lab 04/01/13 0918  ALBUMIN 2.0*   CBC:  Recent Labs Lab 03/29/13 0352  04/03/13 0400 04/04/13 0608  WBC 10.2  < > 10.6* 9.8  NEUTROABS 7.7  --   --   --   HGB 7.2*  < > 6.9* 8.7*  HCT 22.5*  < > 22.3* 27.1*  MCV 73.5*  < > 75.6* 74.5*  PLT 318  < > 310 375  < > = values in this interval not  displayed. Cardiac Enzymes: No results found for this basename: CKTOTAL, CKMB, CKMBINDEX, TROPONINI,  in the last 168 hours CBG:  Recent Labs Lab 04/03/13 1110 04/03/13 1630 04/03/13 2204 04/04/13 0107 04/04/13 0401 04/04/13 1131  GLUCAP 100* 77 106* 106* 97 96   Iron/TIBC/Ferritin    Component Value Date/Time   IRON 17* 03/27/2013 0930   TIBC 166* 03/27/2013 0930   FERRITIN 5064* 03/27/2013 0930   BNP    Component Value Date/Time   PROBNP 19450.0* 03/27/2013 1615   Urinalysis    Component Value Date/Time   COLORURINE YELLOW 03/27/2013 1834   APPEARANCEUR CLOUDY* 03/27/2013 1834   LABSPEC 1.027 03/27/2013 1834   PHURINE 5.0 03/27/2013 1834   GLUCOSEU NEGATIVE 03/27/2013 1834   HGBUR SMALL* 03/27/2013 1834   BILIRUBINUR SMALL* 03/27/2013 1834   KETONESUR NEGATIVE 03/27/2013 1834   PROTEINUR 100* 03/27/2013 1834   UROBILINOGEN 1.0 03/27/2013 1834   NITRITE NEGATIVE 03/27/2013 1834   LEUKOCYTESUR NEGATIVE 03/27/2013 1834   Micro Results: Recent Results (from the  past 240 hour(s))  CULTURE, BLOOD (ROUTINE X 2)     Status: None   Collection Time    03/27/13  5:25 PM      Result Value Range Status   Specimen Description BLOOD RIGHT HAND   Final   Special Requests BOTTLES DRAWN AEROBIC ONLY 10CC   Final   Culture  Setup Time     Final   Value: 03/27/2013 22:06     Performed at [facility]   Culture     Final   Value: NO GROWTH 5 DAYS     Performed at [facility]   Report Status 04/02/2013 FINAL   Final  CULTURE, BLOOD (ROUTINE X 2)     Status: None   Collection Time    03/27/13  5:28 PM      Result Value Range Status   Specimen Description BLOOD RIGHT HAND   Final   Special Requests BOTTLES DRAWN AEROBIC ONLY 10CC   Final   Culture  Setup Time     Final   Value: 03/27/2013 22:05     Performed at [facility]   Culture     Final   Value: NO GROWTH 5 DAYS     Performed at [facility]   Report Status 04/02/2013 FINAL   Final  RESPIRATORY  VIRUS PANEL     Status: None   Collection Time    03/27/13  6:14 PM      Result Value Range Status   Source - RVPAN NASAL SWAB   Corrected   Comment: CORRECTED ON 01/20 AT 1804: PREVIOUSLY REPORTED AS NASAL SWAB   Respiratory Syncytial Virus A NOT DETECTED   Final   Respiratory Syncytial Virus B NOT DETECTED   Final   Influenza A NOT DETECTED   Final   Influenza B NOT DETECTED   Final   Parainfluenza 1 NOT DETECTED   Final   Parainfluenza 2 NOT DETECTED   Final   Parainfluenza 3 NOT DETECTED   Final   Metapneumovirus NOT DETECTED   Final   Rhinovirus NOT DETECTED   Final   Adenovirus NOT DETECTED   Final   Influenza A H1 NOT DETECTED   Final   Influenza A H3 NOT DETECTED   Final   Comment: (NOTE)           Normal Reference Range for each Analyte: NOT DETECTED     Testing performed using the Luminex xTAG Respiratory Viral Panel test     kit.     This test was developed and its performance characteristics determined     by [facility]. It has not been cleared or approved by the [place]     Food and Drug Administration. This test is used for clinical purposes.     It should not be regarded as investigational or for research. This     laboratory is certified under the [place] (CLIA) as qualified to perform high complexity     clinical laboratory testing.     Performed at [place], EXPECTORATED SPUTUM-ASSESSMENT     Status: None   Collection Time    03/27/13  6:34 PM      Result Value Range Status   Specimen Description SPUTUM   Final   Special Requests NONE   Final   Sputum evaluation     Final   Value: THIS SPECIMEN IS ACCEPTABLE. RESPIRATORY CULTURE  REPORT TO FOLLOW.   Report Status 03/27/2013 FINAL   Final  URINE CULTURE     Status: None   Collection Time    03/27/13  6:34 PM      Result Value Range Status   Specimen Description URINE, CLEAN CATCH   Final   Special Requests NONE   Final   Culture   Setup Time     Final   Value: 03/28/2013 01:05     Performed at [place]     Final   Value: NO GROWTH     Performed at [facility]   Culture     Final   Value: NO GROWTH     Performed at [facility]   Report Status 03/28/2013 FINAL   Final  CULTURE, RESPIRATORY (NON-EXPECTORATED)     Status: None   Collection Time    03/27/13  6:34 PM      Result Value Range Status   Specimen Description SPUTUM   Final   Special Requests NONE   Final   Gram Stain     Final   Value: RARE WBC PRESENT,BOTH PMN AND MONONUCLEAR     NO SQUAMOUS EPITHELIAL CELLS SEEN     RARE GRAM POSITIVE COCCI IN PAIRS     RARE GRAM POSITIVE RODS     Performed at [facility]   Culture     Final   Value: NORMAL OROPHARYNGEAL FLORA     Performed at [facility]   Report Status 03/30/2013 FINAL   Final  MRSA PCR SCREENING     Status: None   Collection Time    03/28/13 10:42 PM      Result Value Range Status   MRSA by PCR NEGATIVE  NEGATIVE Final   Comment:            The GeneXpert MRSA Assay (FDA     approved for NASAL specimens     only), is one component of a     comprehensive MRSA colonization     surveillance program. It is not     intended to diagnose MRSA     infection nor to guide or     monitor treatment for     MRSA infections.    MISC: sirolimus level 23.9 (high) quantiferon negative PTH 78.5 (H) CMV pcr negative EBV pcr negative BK virus pcr negative Respiratory virus panel negative Sputum culture- normal oropharyngeal flora UCX NGTD BCX NGTD x 2 FENa- .2%  Dg Chest 2 View  03/27/2013   CLINICAL DATA:  Cough, hemoptysis  EXAM: CHEST  2 VIEW  COMPARISON:  03/23/2013  FINDINGS: Multifocal patchy airspace opacities, most prominent in the right perihilar region, mildly increased. Differential considerations include mild to moderate interstitial edema or possibly multifocal infection.  Suspected small left pleural effusion.  No  pneumothorax.  Cardiomegaly.  Surgical clips overlying the bilateral neck.  IMPRESSION: Multifocal patchy airspace opacities, most prominent in the right perihilar region, mildly increased. Differential considerations include mild to moderate interstitial edema or possibly multifocal infection.  Cardiomegaly with suspected small left pleural effusion.   Electronically Signed   By: Julian Hy M.D.   On: 03/27/2013 13:00   Dg Chest 2 View  03/23/2013   CLINICAL DATA:  Chest pain with history of congestive heart failure  EXAM: CHEST  2 VIEW  COMPARISON:  03/20/2013  FINDINGS: Heart size upper normal. Mild vascular congestion has developed when compared to the prior  study. There is bilateral perihilar peribronchial cuffing with indistinct bilateral perihilar vessels and mild hazy opacity in the perihilar regions bilaterally. There are no pleural effusions. Surgical clips in the thyroid bed again identified.  IMPRESSION: Findings suggest development of congestive heart failure with mild pulmonary edema.   Electronically Signed   By: Skipper Cliche M.D.   On: 03/23/2013 16:22   Dg Chest 2 View  03/20/2013   CLINICAL DATA:  Chest and back pain.  Hypertension.  EXAM: CHEST  2 VIEW  COMPARISON:  08/19/2011  FINDINGS: The lungs are clear without focal infiltrate, edema, pneumothorax or pleural effusion. Cardiopericardial silhouette is at upper limits of normal for size. Imaged bony structures of the thorax are intact. Surgical clips are seen in the thyroid bed and left axilla.  IMPRESSION: Stable.  No acute cardiopulmonary process.   Electronically Signed   By: Misty Stanley M.D.   On: 03/20/2013 07:45   Ct Chest Wo Contrast  03/27/2013   CLINICAL DATA:  Chest pain and shortness of breath for several months. Low-grade fever. Acute renal failure in transplant patient.  EXAM: CT CHEST WITHOUT CONTRAST  TECHNIQUE: Multidetector CT imaging of the chest was performed following the standard protocol without IV  contrast.  COMPARISON:  Chest radiograph performed earlier today at 11:45 a.m.  FINDINGS: Diffuse fluffy bilateral central airspace opacity is seen, most prominent at the upper lung lobes bilaterally. This is most compatible with diffuse pneumonia. Underlying edema cannot be excluded, given underlying small bilateral pleural effusions and interstitial prominence. No pneumothorax is seen.  Scattered coronary artery calcifications are seen. The heart is mildly enlarged. A mildly prominent right paratracheal node is seen, measuring 1.3 cm in short axis. No pericardial effusion is identified. The great vessels are grossly unremarkable in appearance. The patient is status post thyroidectomy. No axillary lymphadenopathy is seen.  The visualized portions of the liver and the spleen are unremarkable in appearance. Severe bilateral renal atrophy is seen, with scattered calcifications noted bilaterally. A small hiatal hernia is noted.  IMPRESSION: 1. Diffuse fluffy bilateral central airspace opacification, most prominent at the upper lung lobes. This is most compatible with diffuse multifocal pneumonia. Underlying edema cannot be excluded, given small bilateral pleural effusions and interstitial prominence. 2. Scattered coronary calcifications seen. 3. Mild cardiomegaly. 4. Mildly prominent right paratracheal node, measuring 1.3 cm in short axis. 5. Severe bilateral renal atrophy, with scattered associated calcifications. 6. Small hiatal hernia seen.   Electronically Signed   By: Garald Balding M.D.   On: 03/27/2013 21:48   Nm Bone Scan Whole Body  03/03/2013   CLINICAL DATA:  Pain.  Prostate cancer.  EXAM: NUCLEAR MEDICINE WHOLE BODY BONE SCAN  TECHNIQUE: Whole body anterior and posterior images were obtained approximately 3 hours after intravenous injection of radiopharmaceutical.  COMPARISON:  Bone scan 10/26/2007.  CT 02/28/2013.  RADIOPHARMACEUTICALS:  26.0 mCi Technetium-99 MDP  FINDINGS: Bilateral renal atrophy  with a transplanted kidney right pelvis. No focal bony abnormality. No evidence of bony metastatic disease.  IMPRESSION: No evidence of bony metastatic disease or focal bony abnormality. Bilateral renal atrophy. Transplanted kidney right pelvis.   Electronically Signed   By: Marcello Moores  Register   On: 03/03/2013 14:02   US Renal  03/22/2013   CLINICAL DATA:  Acute kidney injury. Patient with bilateral renal transplant.  EXAM: RENAL/URINARY TRACT ULTRASOUND COMPLETE  COMPARISON:  CT, 02/28/2013  FINDINGS: Right Kidney:  Length: 7.4 cm. Kidney is echogenic. 15 mm cyst arises from the upper pole.  There are probable additional tiny cysts. No hydronephrosis.  Left Kidney:  Native left kidney not visualized.  Transplant kidney: In the right lower quadrant to right upper pelvis, a transplant kidney is visualized. Is normal in overall size measuring 10.7 cm. It has normal parenchymal echogenicity. Small cyst arises from its lower pole measuring 1 cm. No other renal masses. No hydronephrosis.  Bladder:  Appears normal for degree of bladder distention.  IMPRESSION: 1. Transplant kidney is normal in appearance within a small lower pole cyst measuring 1 cm. 2. Atrophic native kidneys. Only the right was visualized. Is echogenic with at least 1 discrete small cysts. No hydronephrosis.   Electronically Signed   By: Amie Portland M.D.   On: 03/22/2013 15:50   Nm Myocar Multi W/spect W/wall Motion / Ef  03/22/2013   CLINICAL DATA:  63yo AA man with PMH of HTN, DM2, CAD with stents, CAD, PVD s/p fem pop, prostate cancer treated with radiation, hx of thyroid cancer, h/o ESRD and kidney transplantation who presents for chest pain and back pain  EXAM: MYOCARDIAL IMAGING WITH SPECT (REST)  TECHNIQUE: Standard myocardial SPECT imaging was performed after resting intravenous injection of Tc-83m sestamibi. Quantitative gated imaging was also performed to evaluate left ventricular wall motion, and estimate left ventricular ejection  fraction. As the patient's heart rate was 129 bpm when he presented for his "stress" images, no additional administration of Lexiscan was performed.  COMPARISON:  None.  FINDINGS: The Image quality is good. There is mild interference from visceral racer uptake. The left ventricle is moderately to severely dilated. There is a moderate in size, moderate-to-severe in intensity inferior and inferolateral perfusion abnormality seen on both rest and "stress" images, possibly worse on the "stress" images. There is global hypokinesis of the left ventricle, with severe basal and mid inferior wall hypokinesis. EF 34%. The severity of LV dysfunction is out of proportion to the perfusion abnormality.  IMPRESSION: Interpretation of the study is severely limited by absence of a clear increase in coronary flow during acquisition of the "stress" images  There is evidence of severe ischemia at rest and/or scar in the right coronary artery distribution. This appears to be at least partly reversible.  The degree of LV dysfunction suggests multivessel CAD versus a component of superimposed nonischemic cardiomyopathy.  No previous studies are available for comparison   Electronically Signed   By: Thurmon Fair   On: 03/22/2013 19:29   Nm Pulmonary Perf And Vent  03/20/2013   CLINICAL DATA:  Chest pain  EXAM: NUCLEAR MEDICINE VENTILATION - PERFUSION LUNG SCAN  Views: Anterior, posterior, left lateral, right lateral, RPO, LPO, RAO, LAO -ventilation and perfusion  Radionuclide: Technetium 65m DTPA -ventilation; Technetium 60m macroaggregated albumin-perfusion  Dose:  40.0 mCi-ventilation; 6.0 mCi-perfusion  Route of administration: Inhalation -ventilation; intravenous -perfusion  COMPARISON:  Chest radiograph March 20, 2013  FINDINGS: Radiotracer uptake on the ventilation study is homogeneous and symmetric bilaterally.  Radiotracer uptake on the perfusion study is homogeneous and symmetric bilaterally.  There is no appreciable  ventilation/perfusion mismatch.  IMPRESSION: No ventilation or perfusion abnormalities. Very low probability of pulmonary embolus.   Electronically Signed   By: Bretta Bang M.D.   On: 03/20/2013 16:15   2D echo 03/22/2012- Study Conclusions  - Left ventricle: The cavity size was mildly dilated. Wall thickness was normal. Systolic function was mildly reduced. The estimated ejection fraction was in the range of 45% to 50%. There is hypokinesis of the lateral myocardium. Doppler parameters  are consistent with restrictive physiology, indicative of decreased left ventricular diastolic compliance and/or increased left atrial pressure. Doppler parameters are consistent with high ventricular filling pressure. - Aortic valve: Valve mobility was restricted. There was mild to moderate stenosis. Trivial regurgitation. Valve area: 1.56cm^2(VTI). Valve area: 1.47cm^2 (Vmax). - Mitral valve: Mild regurgitation. - Left atrium: The atrium was mildly dilated. - Pulmonary arteries: Systolic pressure was mildly increased. PA peak pressure: 64mm Hg (S).  Medications: I have reviewed the patient's current medications. Scheduled Meds: . aspirin  81 mg Oral Daily  . atorvastatin  80 mg Oral q1800  . diltiazem  120 mg Oral Daily  . feeding supplement (ENSURE COMPLETE)  237 mL Oral TID BM  . insulin aspart  0-9 Units Subcutaneous TID WC  . isosorbide mononitrate  30 mg Oral Daily  . levofloxacin  750 mg Oral Q48H  . levothyroxine  150 mcg Oral Daily  . methocarbamol  500 mg Oral BID  . metoprolol tartrate  50 mg Oral Q6H  . mycophenolate  720 mg Oral BID  . pantoprazole  40 mg Oral Daily  . polyethylene glycol powder  1 Container Oral Once  . regadenoson  0.4 mg Intravenous Once  . sirolimus  1 mg Oral Daily  . sodium bicarbonate  650 mg Oral BID   Continuous Infusions:   PRN Meds:.sodium chloride, oxyCODONE, sodium chloride  Assessment/Plan:  HCAP: Continues to improve.  -today is day 8  of antibiotics--done with antibiotic therapy 1/27 -PT/OT- recommend HHPT/OT and walker  Acute GI bleed w/ dropping hemoglobin on 03/28/2013: No more diarrhea or BRBPR.  BE today to complete GI work up. Pt received 1U pRBCs yesterday. Hb stable today 8.3-->8.7. -barium enema to reassess R colon today -continue to hold heparin, ASA, plavix (1/20 first day without any of these medications)  Chest pain with hx of CAD, dCHF and current atrial tachycardia- No more chest pain overnight. No episodes of atrial tachycardia overnight despite discontinuing multaq on 1/27. Cardiology wrote in their note to consider changing lopressor to $RemoveBefo'100mg'WNjrDkNJVoS$  BID at discharge, will touch base with them to see when this change will occur. 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 Likely sirolimus toxicity vs multaq induced AKI (also interacts with sirolimus) vs prerenal given poor po intake. Hopefully Cr will plateau or trend down now that multaq has been discontinued. -emphasized importance of eating and drinking well  -f/u sirolimus level  -discontinued multaq 1/27 - renal decreased sirolimus to $RemoveBefo'1mg'gAYkGXJBNmG$  daily on 1/27 -renal following, appreciate recs  Anion Gap Metabolic acidosis: Unclear etiology. Likely 2/2 CKD.  -encourage eating, continue ensure TID as per nutrition  -continue bicarb $RemoveBeforeDE'650mg'hFZmmIuNuPiptrD$  BID  Metastatic Prostate cancer with back pain: Back pain continues, though seemingly well controlled on oxycodone. -Outpatient uro-oncology work up  HTN: stable -continue IMDUR $RemoveBeforeD'30mg'AgXxRqqpqFpZrT$  daily and metoprolol $RemoveBefore'50mg'xUDSegYhhcJIm$  q6h, diltiazem $RemoveBefore'120mg'mOqpPkpwwSLdN$  daily  DM-II: A1c 7.8 on admission.CBGs stable  overnight. -restarting lantus today at 24U qHS since patient will be given diet -SSI sensitive   DVT px: SCDs Diet: renal diet Dispo:  planning for discharge once pt stabilizes medically   The patient does have a current PCP (Sueanne Margarita, MD) and does not need an [facility] hospital follow-up appointment after  discharge.  The patient does not have transportation limitations that hinder transportation to clinic appointments.  Services Needed at time of discharge: Y = Yes, Blank = No PT:   OT:   RN:   Equipment:   Other:     LOS: 15 days   Rebecca Eaton, MD 04/04/2013, 1:04 PM

## 2013-04-04 NOTE — Progress Notes (Signed)
[place]   Spoke with Dr. Lorrene Reid due to patient losing iv access.  she does not think patient needs IV access at this time.  Update the RN and disc'ed with Dr. Marinda Elk.  Primary team to reassess in the am  Joshua Dubin MD

## 2013-04-04 NOTE — Progress Notes (Signed)
[facility] Progress Note   Subjective:   Feeling well this am.  No complaints.    Objective:   BP 150/50  Pulse 77  Temp(Src) 97.8 F (36.6 C) (Oral)  Resp 18  Ht 5\' 11"  (1.803 m)  Wt 163 lb 14.9 oz (74.36 kg)  BMI 22.87 kg/m2  SpO2 100%  Intake/Output Summary (Last 24 hours) at 04/04/13 0728 Last data filed at 04/03/13 1516  Gross per 24 hour  Intake    375 ml  Output    200 ml  Net    175 ml   Physical Exam: Gen: resting in bed. NAD.  CVS: RRR. 2/6 Systolic murmur. Resp: Bibasilar rales and scattered wheezing.  Abd: soft, nontender, abdomen mildly distended.  Ext: No LE edema.   Imaging: Renal US  03/22/2013   IMPRESSION: 1. Transplant kidney is normal in appearance within a small lower pole cyst measuring 1 cm. 2. Atrophic native kidneys. Only the right was visualized. Is echogenic with at least 1 discrete small cysts. No hydronephrosis.   Electronically Signed   By: Lajean Manes M.D.   On: 03/22/2013 15:50   Labs: BMET  Recent Labs Lab 03/29/13 0352 03/30/13 0316 03/31/13 0500 04/01/13 0918 04/02/13 0615 04/03/13 0400 04/04/13 0608  NA 132* 135* 136* 134* 133* 133* 131*  K 4.0 4.3 4.3 3.9 4.2 4.3 4.2  CL 90* 93* 93* 90* 91* 89* 86*  CO2 22 25 25 24 24 22 23   GLUCOSE 204* 102* 52* 211* 112* 135* 98  BUN 46* 41* 37* 33* 36* 42* 47*  CREATININE 2.31* 2.22* 2.10* 2.18* 2.67* 3.40* 3.68*  CALCIUM 7.5* 7.2* 7.4* 7.1* 6.9* 7.0* 7.2*  PHOS  --   --   --  3.2  --   --   --    CBC  Recent Labs Lab 03/29/13 0352  04/01/13 0432 04/03/13 0001 04/03/13 0400 04/04/13 0608  WBC 10.2  < > 8.9 9.3 10.6* 9.8  NEUTROABS 7.7  --   --   --   --   --   HGB 7.2*  < > 8.2* 8.3* 6.9* 8.7*  HCT 22.5*  < > 26.2* 26.4* 22.3* 27.1*  MCV 73.5*  < > 74.2* 74.6* 75.6* 74.5*  PLT 318  < > 454* 314 310 375  < > = values in this interval not displayed.  Medications:     . aspirin  81 mg Oral Daily  . atorvastatin  80 mg Oral q1800  . diltiazem  120  mg Oral Daily  . feeding supplement (ENSURE COMPLETE)  237 mL Oral TID BM  . insulin aspart  0-9 Units Subcutaneous TID WC  . isosorbide mononitrate  30 mg Oral Daily  . levofloxacin  750 mg Oral Q48H  . levothyroxine  150 mcg Oral Daily  . methocarbamol  500 mg Oral BID  . metoprolol tartrate  50 mg Oral Q6H  . mycophenolate  720 mg Oral BID  . pantoprazole  40 mg Oral Daily  . polyethylene glycol powder  1 Container Oral Once  . regadenoson  0.4 mg Intravenous Once  . sirolimus  1 mg Oral Daily  . sodium bicarbonate  650 mg Oral BID    Assessment/ Plan:   Patient is a 63 y.o. male with a PMHx of HTN, DM-2, CAD s/p stenting of LAD, hx of papillary thyroid cancer, CKD III s/p Renal transplant, PVD, Carotid artery stenosis s/p right carotid endarterectomy, metastatic prostate cancer who was admitted to  [facility] on 03/20/2013 for evaluation of chest pain.   1) Chest pain, Hx of CAD  2) Hx of Diastolic CHF  3) Acute on chronic kidney disease s/p history of kidney transplant, baseline creatinine 1.6-1.8 . FENA 0.18.  In setting of Diltiazem use (known inhibitor of Sirolimus metabolism).  Sirolimus level returned elevated at 23.9 confirming suspicion of drug toxicity induced acute renal failure. Multac subsequently added and hb drop to 6.9 - suspect additive drug toxicity d/t interaction of this with sirolimus as well as reported cases of AKI d/t multaq esp in face of vol depletion 4) Anion Gap Metabolic acidosis 5) Fever of Unknown origin >>> HCAP; Now completed antibiotic course 6) Prostate cancer  7) HTN  8) DM-2  9) Paroxysmal Atrial Tachycardia - Uncontrolled. Now on Dilt and Dronedarone  10) Anemia & GI bleed.  11) Hyperkalemia   Plan:  Acute on chronic kidney disease s/p history of kidney transplant, baseline creatinine 1.6-1.8  - Creatinine continuing to rise.  ? Sirolimus toxicity again as well. Decreased Sirolimus to 1 mg daily.  Holding Dronedarone (after discussion with  Cardiology).  - Awaiting sirolimus level.  - Will continue to monitor creatinine closely.  Hopefully will plateau with better hemodynamic status and being off of Multaq.   Anemia - Iron 17, % Saturation of 10; Feraheme and Aranesp given 1/19; s/p Transfusion 1U PRBC's on 1/20 after GI bleed.  - GI following; EGD done 1/23 (Normal), Colonoscopy (large ventral hernia). - Hb dropped yesterday.  Additional transfusion 1 unit PRBC (1/26). Hb stable this am - 8.7. - Patient to go for BE.  Hyperkalemia & Metabolic Acidosis, AG  - Hyperkalemia resolved.  - Continuing bicarb (now decreased to 650 mg BID).  HPTH - PTH returned mildly elevated at 78.5 - No need for intervention currently  PAT - Per Cardiology. - Per EMR, appears controlled currently.  Multaq being held.  Continuing Dilt.   Coral Spikes, DO PGY-2, [facility] 04/04/2013, 7:28 AM  I have seen and examined this patient and agree with assessment and recommendations as outlined in the above note.  Multaq off yesterday.  Creatinine still rising but rate of rise appears to be slowing down.  Sirolimus has been reduced to 1 mg daily.  Level from 1/25 pending (on 2 mg/day at that time).  HR appears controlled on metoprolol and current dose of diltiazem.  Await any additional recs from cardiology. For BE to complete GI workup - source of blood loss remains unclear. GI has signed off. Sherilynn Dieu B,MD 04/04/2013 11:26 AM

## 2013-04-04 NOTE — Progress Notes (Signed)
Spoke with Joshua [name] regarding IV access. Will leave out IV at this time due to not being able to get new site. MD to reassess need for IV during rounds tomorrow.

## 2013-04-05 DIAGNOSIS — E43 Unspecified severe protein-calorie malnutrition: Secondary | ICD-10-CM

## 2013-04-05 LAB — BASIC METABOLIC PANEL
BUN: 50 mg/dL — ABNORMAL HIGH (ref 6–23)
CALCIUM: 6.7 mg/dL — AB (ref 8.4–10.5)
CHLORIDE: 91 meq/L — AB (ref 96–112)
CO2: 19 meq/L (ref 19–32)
Creatinine, Ser: 4.02 mg/dL — ABNORMAL HIGH (ref 0.50–1.35)
GFR calc Af Amer: 17 mL/min — ABNORMAL LOW (ref >90)
GFR calc non Af Amer: 15 mL/min — ABNORMAL LOW (ref >90)
GLUCOSE: 144 mg/dL — AB (ref 70–99)
Potassium: 4.3 mEq/L (ref 3.7–5.3)
SODIUM: 135 meq/L — AB (ref 137–147)

## 2013-04-05 LAB — CBC
HCT: 25.8 % — ABNORMAL LOW (ref 39.0–52.0)
Hemoglobin: 8.3 g/dL — ABNORMAL LOW (ref 13.0–17.0)
MCH: 23.9 pg — AB (ref 26.0–34.0)
MCHC: 32.2 g/dL (ref 30.0–36.0)
MCV: 74.4 fL — AB (ref 78.0–100.0)
Platelets: 346 10*3/uL (ref 150–400)
RBC: 3.47 MIL/uL — ABNORMAL LOW (ref 4.22–5.81)
RDW: 19.3 % — ABNORMAL HIGH (ref 11.5–15.5)
WBC: 8.6 10*3/uL (ref 4.0–10.5)

## 2013-04-05 LAB — GLUCOSE, CAPILLARY
GLUCOSE-CAPILLARY: 131 mg/dL — AB (ref 70–99)
Glucose-Capillary: 143 mg/dL — ABNORMAL HIGH (ref 70–99)
Glucose-Capillary: 178 mg/dL — ABNORMAL HIGH (ref 70–99)

## 2013-04-05 LAB — PHOSPHORUS: PHOSPHORUS: 4.9 mg/dL — AB (ref 2.3–4.6)

## 2013-04-05 MED ORDER — DILTIAZEM HCL ER COATED BEADS 120 MG PO CP24: 120.0000 mg | ORAL_CAPSULE | Freq: Every day | ORAL | Status: DC

## 2013-04-05 MED ORDER — WHITE PETROLATUM GEL
Status: DC | PRN
  Filled 2013-04-05: qty 28.35

## 2013-04-05 MED ORDER — SODIUM BICARBONATE 650 MG PO TABS: 650.0000 mg | ORAL_TABLET | Freq: Two times a day (BID) | ORAL | Status: DC

## 2013-04-05 MED ORDER — OXYCODONE HCL 10 MG PO TABS: 10.0000 mg | ORAL_TABLET | ORAL | Status: DC | PRN

## 2013-04-05 MED ORDER — SIROLIMUS 1 MG PO TABS: 2.0000 mg | ORAL_TABLET | Freq: Every day | ORAL | Status: DC

## 2013-04-05 MED ORDER — METOPROLOL TARTRATE 50 MG PO TABS: 100.0000 mg | ORAL_TABLET | Freq: Two times a day (BID) | ORAL | Status: DC

## 2013-04-05 MED ORDER — ISOSORBIDE MONONITRATE ER 30 MG PO TB24: 30.0000 mg | ORAL_TABLET | Freq: Every day | ORAL | Status: DC

## 2013-04-05 NOTE — Progress Notes (Signed)
Pt. Alert and oriented.  Resting during the night. No distress noted. Pt. C/o pain x2 during the night. PRN pain medication administered as ordered. RN notified by CMT of rhythm change during the night. Pt. Assessed. VS, EKG obtained. On call MD made aware. No new orders received. RN will continue to monitor pt. For changes in condition. Thersia Petraglia, Katherine Roan

## 2013-04-05 NOTE — Progress Notes (Signed)
Subjective: 2-3 minutes of sharp CP last night, 8/10.  spontaneously resolved.   Objective: Vital signs in last 24 hours: Temp:  [98.4 F (36.9 C)-98.6 F (37 C)] 98.6 F (37 C) (01/28 0618) Pulse Rate:  [69-126] 71 (01/28 0618) Resp:  [14-17] 14 (01/28 0618) BP: (125-151)/(42-83) 128/53 mmHg (01/28 0618) SpO2:  [94 %-97 %] 95 % (01/28 0730) Weight:  [182 lb 15.7 oz (83 kg)] 182 lb 15.7 oz (83 kg) (01/28 0641) Last BM Date: 04/04/13  Intake/Output from previous day: 01/27 0701 - 01/28 0700 In: 480 [P.O.:480] Out: 2 [Stool:2] Intake/Output this shift:    Medications Current Facility-Administered Medications  Medication Dose Route Frequency Provider Last Rate Last Dose  . 0.9 %  sodium chloride infusion  250 mL Intravenous PRN Ivor Costa, MD      . aspirin chewable tablet 81 mg  81 mg Oral Daily Ivor Costa, MD   81 mg at 04/04/13 1138  . atorvastatin (LIPITOR) tablet 80 mg  80 mg Oral q1800 Ivor Costa, MD   80 mg at 04/04/13 1816  . diltiazem (CARDIZEM CD) 24 hr capsule 120 mg  120 mg Oral Daily Candee Furbish, MD   120 mg at 04/04/13 1228  . feeding supplement (ENSURE COMPLETE) (ENSURE COMPLETE) liquid 237 mL  237 mL Oral TID BM Dalene Carrow, RD   237 mL at 04/03/13 1000  . insulin aspart (novoLOG) injection 0-9 Units  0-9 Units Subcutaneous TID WC [name], MD   1 Units at 04/05/13 0827  . insulin glargine (LANTUS) injection 24 Units  24 Units Subcutaneous QHS Rebecca Eaton, MD      . isosorbide mononitrate (IMDUR) 24 hr tablet 30 mg  30 mg Oral Daily Rogelia Mire, NP   30 mg at 04/04/13 1134  . levofloxacin (LEVAQUIN) tablet 750 mg  750 mg Oral Q48H Axel Filler, MD   750 mg at 04/04/13 1133  . levothyroxine (SYNTHROID, LEVOTHROID) tablet 150 mcg  150 mcg Oral Daily Ivor Costa, MD   150 mcg at 04/04/13 1134  . methocarbamol (ROBAXIN) tablet 500 mg  500 mg Oral BID Ivor Costa, MD   500 mg at 04/05/13 0131  . metoprolol (LOPRESSOR) tablet 50 mg  50  mg Oral Q6H Candee Furbish, MD   50 mg at 04/05/13 0618  . mycophenolate (MYFORTIC) EC tablet 720 mg  720 mg Oral BID Ivor Costa, MD   720 mg at 04/05/13 0131  . oxyCODONE (Oxy IR/ROXICODONE) immediate release tablet 10 mg  10 mg Oral Q3H PRN Rebecca Eaton, MD   10 mg at 04/05/13 0527  . pantoprazole (PROTONIX) EC tablet 40 mg  40 mg Oral Daily Ivor Costa, MD   40 mg at 04/04/13 1138  . polyethylene glycol powder (GLYCOLAX/MIRALAX) container 1 Container  1 Container Oral Once Vena Rua, Joshua [name]      . regadenoson (LEXISCAN) injection SOLN 0.4 mg  0.4 mg Intravenous Once [name], MD      . sirolimus (RAPAMUNE) tablet 1 mg  1 mg Oral Daily Coral Spikes, DO   1 mg at 04/04/13 1135  . sodium bicarbonate tablet 650 mg  650 mg Oral BID Tamela Oddi Hess, DO   650 mg at 04/05/13 0133  . sodium chloride 0.9 % injection 3 mL  3 mL Intravenous PRN Ivor Costa, MD   3 mL at 03/24/13 2024    PE: General appearance: alert, cooperative and no distress Lungs: clear to  auscultation bilaterally Heart: regular rate and rhythm, S1, S2 normal, no murmur, click, rub or gallop Extremities: No LEE Pulses: 1+ radials and PT pulses. Skin: Warm and dry Neurologic: Grossly normal  Lab Results:   Recent Labs  04/03/13 0400 04/04/13 0608 04/05/13 0610  WBC 10.6* 9.8 8.6  HGB 6.9* 8.7* 8.3*  HCT 22.3* 27.1* 25.8*  PLT 310 375 346   BMET  Recent Labs  04/03/13 0400 04/04/13 0608 04/05/13 0610  NA 133* 131* 135*  K 4.3 4.2 4.3  CL 89* 86* 91*  CO2 22 23 19   GLUCOSE 135* 98 144*  BUN 42* 47* 50*  CREATININE 3.40* 3.68* 4.02*  CALCIUM 7.0* 7.2* 6.7*    Assessment/Plan   Principal Problem:   Unstable angina Active Problems:   PAT (paroxysmal atrial tachycardia)   Occlusion and stenosis of carotid artery without mention of cerebral infarction   Coronary artery disease- medical Rx, no cath secondary to renal transplant   Hypertension   Dyslipidemia, goal LDL below 70   Hypothyroidism    Prostate cancer, 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  Asymptomatic.  Multaq DCd due to worsening renal function.  3.4>>3.68>>4.02-today.  Continues on  cardizem CD120 and lopressor 50mg   Q6hr.  Continue current dosing.  Mild decrease in HGB today. SP one unit PRBCs 1/26..   LOS: 16 days    [name], Joshua 04/05/2013 10:04 AM  I have personally seen and examined this patient with Joshua Fuller, Joshua [name]. I agree with the assessment and plan as outlined above. Continue current therapy for his atrial tach. See full EP evaluation. Could d/c telemetry per EP recs. Will sign off for now. Please call with questions.   Joshua [name] 11:32 AM 04/05/2013

## 2013-04-05 NOTE — Progress Notes (Signed)
Went over all d/c info with pt and family - wife and daughter who is a Presenter, broadcasting. Pt aware that his meds are ready for pick up on the way home. Pt has prescription. Also has walker and 02 tank with instructions per [facility]   Pt has all personal belongings. Accompanied by RN, nurse tech and wife. Pt and family aware of all follow up appts. And when to take next dose of home  meds.

## 2013-04-05 NOTE — Discharge Instructions (Signed)
Please go to [facility] for Labs on Friday 1/30 and on Monday 04/10/2012 @ 8:30.   Supraventricular Tachycardia Supraventricular tachycardia (SVT) is an abnormal heart rhythm (arrhythmia) that causes the heart to beat very fast (tachycardia). This kind of fast heartbeat originates in the upper chambers of the heart (atria). SVT can cause the heart to beat greater than 100 beats per minute. SVT can have a rapid burst of heartbeats. This can start and stop suddenly without warning and is called nonsustained. SVT can also be sustained, in which the heart beats at a continuous fast rate.  CAUSES  There can be different causes of SVT. Some of these include:  Heart valve problems such as mitral valve prolapse.  An enlarged heart (hypertrophic cardiomyopathy).  Congenital heart problems.  Heart inflammation (pericarditis).  Hyperthyroidism.  Low potassium or magnesium levels.  Caffeine.  Drug use such as cocaine, methamphetamines, or stimulants.  Some over-the-counter medicines such as:  Decongestants.  Diet medicines.  Herbal medicines. SYMPTOMS  Symptoms of SVT can vary. Symptoms depend on whether the SVT is sustained or nonsustained. You may experience:  No symptoms (asymptomatic).  An awareness of your heart beating rapidly (palpitations).  Shortness of breath.  Chest pain or pressure. If your blood pressure drops because of the SVT, you may experience:  Fainting or near fainting.  Weakness.  Dizziness. DIAGNOSIS  Different tests can be performed to diagnose SVT, such as:  An electrocardiogram (EKG). This is a painless test that records the electrical activity of your heart.  Holter monitor. This is a 24 hour recording of your heart rhythm. You will be given a diary. Write down all symptoms that you have and what you were doing at the time you experienced symptoms.  Arrhythmia monitor. This is a small device that your wear for several weeks. It records the heart  rhythm when you have symptoms.  Echocardiogram. This is an imaging test to help detect abnormal heart structure such as congenital abnormalities, heart valve problems, or heart enlargement.  Stress test. This test can help determine if the SVT is related to exercise.  Electrophysiology study (EPS). This is a procedure that evaluates your heart's electrical system and can help your caregiver find the cause of your SVT. TREATMENT  Treatment of SVT depends on the symptoms, how often it recurs, and whether there are any underlying heart problems.   If symptoms are rare and no other cardiac disease is present, no treatment may be needed.  Blood work may be done to check potassium, magnesium, and thyroid hormone levels to see if they are abnormal. If these levels are abnormal, treatment to correct the problems will occur. Medicines Your caregiver may use oral medicines to treat SVT. These medicines are given for long-term control of SVT. Medicines may be used alone or in combination with other treatments. These medicines work to slow nerve impulses in the heart muscle. These medicines can also be used to treat high blood pressure. Some of these medicines may include:  Calcium channel blockers.  Beta blockers.  Digoxin. Nonsurgical procedures Nonsurgical techniques may be used if oral medicines do not work. Some examples include:  Cardioversion. This technique uses either drugs or an electrical shock to restore a normal heart rhythm.  Cardioversion drugs may be given through an intravenous (IV) line to help "reset" the heart rhythm.  In electrical cardioversion, the caregiver shocks your heart to stop its beat for a split second. This helps to reset the heart to a normal  rhythm.  Ablation. This procedure is done under mild sedation. High frequency radio wave energy is used to destroy the area of heart tissue responsible for the SVT. HOME CARE INSTRUCTIONS   Do not smoke.  Only take  medicines prescribed by your caregiver. Check with your caregiver before using over-the-counter medicines.  Check with your caregiver about how much alcohol and caffeine (coffee, tea, colas, or chocolate) you may have.  It is very important to keep all follow-up referrals and appointments in order to properly manage this problem. SEEK IMMEDIATE MEDICAL CARE IF:  You have dizziness.  You faint or nearly faint.  You have shortness of breath.  You have chest pain or pressure.  You have sudden nausea or vomiting.  You have profuse sweating.  You are concerned about how long your symptoms last.  You are concerned about the frequency of your SVT episodes. If you have the above symptoms, call your local emergency services (911 in [place] immediately. Do not drive yourself to the hospital. MAKE SURE YOU:   Understand these instructions.  Will watch your condition.  Will get help right away if you are not doing well or get worse. Document Released: 02/23/2005 Document Revised: 05/18/2011 Document Reviewed: 06/07/2008 [facility] Patient Information 2014 [place], [place]. Pneumonia, Adult Pneumonia is an infection of the lungs.  CAUSES Pneumonia may be caused by bacteria or a virus. Usually, these infections are caused by breathing infectious particles into the lungs (respiratory tract). SYMPTOMS   Cough.  Fever.  Chest pain.  Increased rate of breathing.  Wheezing.  Mucus production. DIAGNOSIS  If you have the common symptoms of pneumonia, your caregiver will typically confirm the diagnosis with a chest X-ray. The X-ray will show an abnormality in the lung (pulmonary infiltrate) if you have pneumonia. Other tests of your blood, urine, or sputum may be done to find the specific cause of your pneumonia. Your caregiver may also do tests (blood gases or pulse oximetry) to see how well your lungs are working. TREATMENT  Some forms of pneumonia may be spread to other people when you  cough or sneeze. You may be asked to wear a mask before and during your exam. Pneumonia that is caused by bacteria is treated with antibiotic medicine. Pneumonia that is caused by the influenza virus may be treated with an antiviral medicine. Most other viral infections must run their course. These infections will not respond to antibiotics.  PREVENTION A pneumococcal shot (vaccine) is available to prevent a common bacterial cause of pneumonia. This is usually suggested for:  People over 29 years old.  Patients on chemotherapy.  People with chronic lung problems, such as bronchitis or emphysema.  People with immune system problems. If you are over 65 or have a high risk condition, you may receive the pneumococcal vaccine if you have not received it before. In some countries, a routine influenza vaccine is also recommended. This vaccine can help prevent some cases of pneumonia.You may be offered the influenza vaccine as part of your care. If you smoke, it is time to quit. You may receive instructions on how to stop smoking. Your caregiver can provide medicines and counseling to help you quit. HOME CARE INSTRUCTIONS   Cough suppressants may be used if you are losing too much rest. However, coughing protects you by clearing your lungs. You should avoid using cough suppressants if you can.  Your caregiver may have prescribed medicine if he or she thinks your pneumonia is caused by a bacteria or  influenza. Finish your medicine even if you start to feel better.  Your caregiver may also prescribe an expectorant. This loosens the mucus to be coughed up.  Only take over-the-counter or prescription medicines for pain, discomfort, or fever as directed by your caregiver.  Do not smoke. Smoking is a common cause of bronchitis and can contribute to pneumonia. If you are a smoker and continue to smoke, your cough may last several weeks after your pneumonia has cleared.  A cold steam vaporizer or  humidifier in your room or home may help loosen mucus.  Coughing is often worse at night. Sleeping in a semi-upright position in a recliner or using a couple pillows under your head will help with this.  Get rest as you feel it is needed. Your body will usually let you know when you need to rest. SEEK IMMEDIATE MEDICAL CARE IF:   Your illness becomes worse. This is especially true if you are elderly or weakened from any other disease.  You cannot control your cough with suppressants and are losing sleep.  You begin coughing up blood.  You develop pain which is getting worse or is uncontrolled with medicines.  You have a fever.  Any of the symptoms which initially brought you in for treatment are getting worse rather than better.  You develop shortness of breath or chest pain. MAKE SURE YOU:   Understand these instructions.  Will watch your condition.  Will get help right away if you are not doing well or get worse. Document Released: 02/23/2005 Document Revised: 05/18/2011 Document Reviewed: 05/15/2010 [facility] Patient Information 2014 [place], [place]. Gastrointestinal Bleeding Gastrointestinal (GI) bleeding means there is bleeding somewhere along the digestive tract, between the mouth and anus. CAUSES  There are many different problems that can cause GI bleeding. Possible causes include:  Esophagitis. This is inflammation, irritation, or swelling of the esophagus.  Hemorrhoids.These are veins that are full of blood (engorged) in the rectum. They cause pain, inflammation, and may bleed.  Anal fissures.These are areas of painful tearing which may bleed. They are often caused by passing hard stool.  Diverticulosis.These are pouches that form on the colon over time, with age, and may bleed significantly.  Diverticulitis.This is inflammation in areas with diverticulosis. It can cause pain, fever, and bloody stools, although bleeding is rare.  Polyps and cancer. Colon  cancer often starts out as precancerous polyps.  Gastritis and ulcers.Bleeding from the upper gastrointestinal tract (near the stomach) may travel through the intestines and produce black, sometimes tarry, often bad smelling stools. In certain cases, if the bleeding is fast enough, the stools may not be black, but red. This condition may be life-threatening. SYMPTOMS   Vomiting bright red blood or material that looks like coffee grounds.  Bloody, black, or tarry stools. DIAGNOSIS  Your caregiver may diagnose your condition by taking your history and performing a physical exam. More tests may be needed, including:  X-rays and other imaging tests.  Esophagogastroduodenoscopy (EGD). This test uses a flexible, lighted tube to look at your esophagus, stomach, and small intestine.  Colonoscopy. This test uses a flexible, lighted tube to look at your colon. TREATMENT  Treatment depends on the cause of your bleeding.   For bleeding from the esophagus, stomach, small intestine, or colon, the caregiver doing your EGD or colonoscopy may be able to stop the bleeding as part of the procedure.  Inflammation or infection of the colon can be treated with medicines.  Many rectal problems can be treated  with creams, suppositories, or warm baths.  Surgery is sometimes needed.  Blood transfusions are sometimes needed if you have lost a lot of blood. If bleeding is slow, you may be allowed to go home. If there is a lot of bleeding, you will need to stay in the hospital for observation. HOME CARE INSTRUCTIONS   Take any medicines exactly as prescribed.  Keep your stools soft by eating foods that are high in fiber. These foods include whole grains, legumes, fruits, and vegetables. Prunes (1 to 3 a day) work well for many people.  Drink enough fluids to keep your urine clear or pale yellow. SEEK IMMEDIATE MEDICAL CARE IF:   Your bleeding increases.  You feel lightheaded, weak, or you faint.  You  have severe cramps in your back or abdomen.  You pass large blood clots in your stool.  Your problems are getting worse. MAKE SURE YOU:   Understand these instructions.  Will watch your condition.  Will get help right away if you are not doing well or get worse. Document Released: 02/21/2000 Document Revised: 02/10/2012 Document Reviewed: 02/02/2011 [facility] Patient Information 2014 [place], [place].

## 2013-04-05 NOTE — Progress Notes (Signed)
Internal Medicine Attending  Date: 04/05/2013  Patient name: Joshua [name] Medical record number: [id number] Date of birth: 05-28-1950 Age: 63 y.o. Gender: male  I saw and evaluated the patient, and discussed his care on A.M rounds with housestaff.  I reviewed the resident's note by Dr. Mechele Claude and I agree with the resident's findings and plans as documented in her note.

## 2013-04-05 NOTE — Progress Notes (Signed)
SATURATION QUALIFICATIONS: (This note is used to comply with regulatory documentation for home oxygen)  Patient Saturations on Room Air at Rest =94  Patient Saturations on Room Air while Ambulating =86  Patient Saturations on 2 Liters of oxygen while Ambulating =92  Please briefly explain why patient needs home oxygen:CHF/CAD sats dropped while ambulating

## 2013-04-05 NOTE — Progress Notes (Signed)
UR completed Jacolyn Joaquin K. Emrys Mceachron, RN, BSN, MSHL, CCM  04/05/2013 4:45 PM

## 2013-04-05 NOTE — Progress Notes (Signed)
[facility] Progress Note   Subjective:   Feeling well this am. Ready to go home.    Objective:   BP 128/53  Pulse 71  Temp(Src) 98.6 F (37 C) (Oral)  Resp 14  Ht 5\' 11"  (1.803 m)  Wt 182 lb 15.7 oz (83 kg)  BMI 25.53 kg/m2  SpO2 94%  Intake/Output Summary (Last 24 hours) at 04/05/13 0659 Last data filed at 04/05/13 0500  Gross per 24 hour  Intake    480 ml  Output      2 ml  Net    478 ml   Physical Exam: Gen: resting in bed. NAD.  CVS: RRR. 2/6 Systolic murmur. Resp: Bibasilar rales and scattered wheezing.  Abd: soft, nontender, abdomen mildly distended.  Ext: No LE edema.   Imaging: Renal US  03/22/2013   IMPRESSION: 1. Transplant kidney is normal in appearance within a small lower pole cyst measuring 1 cm. 2. Atrophic native kidneys. Only the right was visualized. Is echogenic with at least 1 discrete small cysts. No hydronephrosis.   Electronically Signed   By: Lajean Manes M.D.   On: 03/22/2013 15:50   Labs: BMET  Recent Labs Lab 03/30/13 0316 03/31/13 0500 04/01/13 0918 04/02/13 0615 04/03/13 0400 04/04/13 0608  NA 135* 136* 134* 133* 133* 131*  K 4.3 4.3 3.9 4.2 4.3 4.2  CL 93* 93* 90* 91* 89* 86*  CO2 25 25 24 24 22 23   GLUCOSE 102* 52* 211* 112* 135* 98  BUN 41* 37* 33* 36* 42* 47*  CREATININE 2.22* 2.10* 2.18* 2.67* 3.40* 3.68*  CALCIUM 7.2* 7.4* 7.1* 6.9* 7.0* 7.2*  PHOS  --   --  3.2  --   --   --    CBC  Recent Labs Lab 04/03/13 0001 04/03/13 0400 04/04/13 0608 04/05/13 0610  WBC 9.3 10.6* 9.8 8.6  HGB 8.3* 6.9* 8.7* 8.3*  HCT 26.4* 22.3* 27.1* 25.8*  MCV 74.6* 75.6* 74.5* 74.4*  PLT 314 310 375 346    Medications:     . aspirin  81 mg Oral Daily  . atorvastatin  80 mg Oral q1800  . diltiazem  120 mg Oral Daily  . feeding supplement (ENSURE COMPLETE)  237 mL Oral TID BM  . insulin aspart  0-9 Units Subcutaneous TID WC  . insulin glargine  24 Units Subcutaneous QHS  . isosorbide mononitrate  30 mg Oral  Daily  . levofloxacin  750 mg Oral Q48H  . levothyroxine  150 mcg Oral Daily  . methocarbamol  500 mg Oral BID  . metoprolol tartrate  50 mg Oral Q6H  . mycophenolate  720 mg Oral BID  . pantoprazole  40 mg Oral Daily  . polyethylene glycol powder  1 Container Oral Once  . regadenoson  0.4 mg Intravenous Once  . sirolimus  1 mg Oral Daily  . sodium bicarbonate  650 mg Oral BID    Assessment/ Plan:   Patient is a 63 y.o. male with a PMHx of HTN, DM-2, CAD s/p stenting of LAD, hx of papillary thyroid cancer, CKD III s/p Renal transplant, PVD, Carotid artery stenosis s/p right carotid endarterectomy, metastatic prostate cancer who was admitted to [facility] on 03/20/2013 for evaluation of chest pain.   1) Chest pain, Hx of CAD  2) Hx of Diastolic CHF  3) Acute on chronic kidney disease s/p history of kidney transplant, baseline creatinine 1.6-1.8 .  - In setting of Diltiazem use (known inhibitor of Sirolimus metabolism).  Sirolimus level returned elevated at 23.9 confirming suspicion of drug toxicity induced acute renal failure.   - Multaq added 1/24 for control of PAT.  Patient also had addition drop in Hb on 1/26.  Creatinine bumped.  Multaq known to interfere with sirolimus metabolism and can cause renal failure. Multaq held since 1/27. 4) Anion Gap Metabolic acidosis 5) Fever of Unknown origin >>> HCAP; Now completed antibiotic course 6) Prostate cancer  7) HTN  8) DM-2  9) Paroxysmal Atrial Tachycardia - Uncontrolled. Now on Dilt and Dronedarone  10) Anemia & GI bleed.  11) Hyperkalemia   Plan:  Acute on chronic kidney disease s/p history of kidney transplant, baseline creatinine 1.6-1.8  - Creatinine continuing to rise.  4.02 this am.  - Sirolimus level returned - 9.4 (while on sirolimus 2 mg + dilt + multaq).  Rise in creatinine likely multifactorial from Multaq and GI bleed.  If does not improve, may need transplant biopsy at [facility]. - Will plan to let patient go home today - Follow up  labs scheduled for Friday and Monday through [facility] and we will triage to [facility] if renal function continues to deteriorate for possible transplant bx - we will f/u on those labs - Patient to go home on Sirolimus 2 mg.   Anemia - Iron 17, % Saturation of 10; Feraheme and Aranesp given 1/19; s/p Transfusion 1U PRBC's on 1/20 after GI bleed. Additional transfusion 1 unit PRBC (1/26) after additional drop in Hb. - GI following; EGD done 1/23 (Normal), Colonoscopy (large ventral hernia). - Barium enema done - No underlying source found. - Hb stable this am @ 8.3.   Hyperkalemia & Metabolic Acidosis, AG  - Hyperkalemia resolved.  - Continuing bicarb (650 mg BID).  HPTH - PTH returned mildly elevated at 78.5 - No need for intervention currently  PAT - Per Cardiology. They are recommending Dilt and Metoprolol (consolidation of metoprolol 100 mg BID once stable). - Will discuss with cardiology today and then can be discharged home.   * Patient okayed to go home today.  Follow up appointment with Renal and Labs scheduled (see follow up section and see above note).  Patient to continue on Dilt and Metoprolol (Metoprolol needs to be 100 mg BID).   Coral Spikes, DO PGY-2, [facility] 04/05/2013, 6:59 AM  I have seen and examined this patient and agree with plan as outlined in the above note. Discharge OK on 2 mg/day sirolomus with cbc, renal and sirolimus levels at our office on Friday and on Monday with f/u either at our office or triage to [place] for bx, depending on course of improvement or not. Berdie Malter B,MD 04/05/2013 12:54 PM

## 2013-04-05 NOTE — Progress Notes (Signed)
Subjective: [name] feels well this morning. He had a short episode of chest pain last night the resolved after receiving oxycodone. Cough is nearly gone. No bloody stools. Telemetry showed 1 short episode of atrial tachycardia.   Objective: Vital signs in last 24 hours: Filed Vitals:   04/05/13 0641 04/05/13 0730 04/05/13 1100 04/05/13 1115  BP:   131/56   Pulse:    77  Temp:   98.4 F (36.9 C)   TempSrc:   Oral   Resp:   18   Height:      Weight: 182 lb 15.7 oz (83 kg)     SpO2:  95% 100%   on room air  Weight change: 19 lb 0.8 oz (8.64 kg)  Intake/Output Summary (Last 24 hours) at 04/05/13 1347 Last data filed at 04/05/13 0500  Gross per 24 hour  Intake    480 ml  Output      1 ml  Net    479 ml   Physical Exam General: lying comfortably in bed HEENT: [place]/AT, vision grossly intact Cardiac: RRR, harsh systolic ejection murmur loudest over R sternal border Pulm: CTAB today Abd: soft, nontender, nondistended, BS present Ext: warm and well perfused, no pedal edema; many excoriations to BLE Neuro: alert and oriented X3, cranial nerves II-XII grossly intact, moves all extremities spontaneously  Lab Results: Basic Metabolic Panel:  Recent Labs Lab 04/01/13 0918  04/04/13 0608 04/05/13 0610  NA 134*  < > 131* 135*  K 3.9  < > 4.2 4.3  CL 90*  < > 86* 91*  CO2 24  < > 23 19  GLUCOSE 211*  < > 98 144*  BUN 33*  < > 47* 50*  CREATININE 2.18*  < > 3.68* 4.02*  CALCIUM 7.1*  < > 7.2* 6.7*  PHOS 3.2  --   --  4.9*  < > = values in this interval not displayed. Liver Function Tests:  Recent Labs Lab 04/01/13 0918  ALBUMIN 2.0*   CBC:  Recent Labs Lab 04/04/13 0608 04/05/13 0610  WBC 9.8 8.6  HGB 8.7* 8.3*  HCT 27.1* 25.8*  MCV 74.5* 74.4*  PLT 375 346   Cardiac Enzymes: No results found for this basename: CKTOTAL, CKMB, CKMBINDEX, TROPONINI,  in the last 168 hours CBG:  Recent Labs Lab 04/04/13 1131 04/04/13 1547 04/04/13 2011 04/05/13 0042  04/05/13 0704 04/05/13 1121  GLUCAP 96 91 122* 131* 143* 178*   Iron/TIBC/Ferritin    Component Value Date/Time   IRON 17* 03/27/2013 0930   TIBC 166* 03/27/2013 0930   FERRITIN 5064* 03/27/2013 0930   BNP    Component Value Date/Time   PROBNP 19450.0* 03/27/2013 1615   Urinalysis    Component Value Date/Time   COLORURINE YELLOW 03/27/2013 1834   APPEARANCEUR CLOUDY* 03/27/2013 1834   LABSPEC 1.027 03/27/2013 1834   PHURINE 5.0 03/27/2013 1834   GLUCOSEU NEGATIVE 03/27/2013 1834   HGBUR SMALL* 03/27/2013 1834   BILIRUBINUR SMALL* 03/27/2013 1834   KETONESUR NEGATIVE 03/27/2013 1834   PROTEINUR 100* 03/27/2013 1834   UROBILINOGEN 1.0 03/27/2013 1834   NITRITE NEGATIVE 03/27/2013 1834   LEUKOCYTESUR NEGATIVE 03/27/2013 1834   Micro Results: Recent Results (from the past 240 hour(s))  CULTURE, BLOOD (ROUTINE X 2)     Status: None   Collection Time    03/27/13  5:25 PM      Result Value Range Status   Specimen Description BLOOD RIGHT HAND   Final   Special  Requests BOTTLES DRAWN AEROBIC ONLY 10CC   Final   Culture  Setup Time     Final   Value: 03/27/2013 22:06     Performed at [facility]   Culture     Final   Value: NO GROWTH 5 DAYS     Performed at [facility]   Report Status 04/02/2013 FINAL   Final  CULTURE, BLOOD (ROUTINE X 2)     Status: None   Collection Time    03/27/13  5:28 PM      Result Value Range Status   Specimen Description BLOOD RIGHT HAND   Final   Special Requests BOTTLES DRAWN AEROBIC ONLY 10CC   Final   Culture  Setup Time     Final   Value: 03/27/2013 22:05     Performed at [facility]   Culture     Final   Value: NO GROWTH 5 DAYS     Performed at [facility]   Report Status 04/02/2013 FINAL   Final  RESPIRATORY VIRUS PANEL     Status: None   Collection Time    03/27/13  6:14 PM      Result Value Range Status   Source - RVPAN NASAL SWAB   Corrected   Comment: CORRECTED ON 01/20 AT 1804: PREVIOUSLY REPORTED AS  NASAL SWAB   Respiratory Syncytial Virus A NOT DETECTED   Final   Respiratory Syncytial Virus B NOT DETECTED   Final   Influenza A NOT DETECTED   Final   Influenza B NOT DETECTED   Final   Parainfluenza 1 NOT DETECTED   Final   Parainfluenza 2 NOT DETECTED   Final   Parainfluenza 3 NOT DETECTED   Final   Metapneumovirus NOT DETECTED   Final   Rhinovirus NOT DETECTED   Final   Adenovirus NOT DETECTED   Final   Influenza A H1 NOT DETECTED   Final   Influenza A H3 NOT DETECTED   Final   Comment: (NOTE)           Normal Reference Range for each Analyte: NOT DETECTED     Testing performed using the Luminex xTAG Respiratory Viral Panel test     kit.     This test was developed and its performance characteristics determined     by [facility]. It has not been cleared or approved by the [place]     Food and Drug Administration. This test is used for clinical purposes.     It should not be regarded as investigational or for research. This     laboratory is certified under the [place] (CLIA) as qualified to perform high complexity     clinical laboratory testing.     Performed at [place], EXPECTORATED SPUTUM-ASSESSMENT     Status: None   Collection Time    03/27/13  6:34 PM      Result Value Range Status   Specimen Description SPUTUM   Final   Special Requests NONE   Final   Sputum evaluation     Final   Value: THIS SPECIMEN IS ACCEPTABLE. RESPIRATORY CULTURE REPORT TO FOLLOW.   Report Status 03/27/2013 FINAL   Final  URINE CULTURE     Status: None   Collection Time    03/27/13  6:34 PM      Result Value Range Status   Specimen Description URINE, CLEAN CATCH  Final   Special Requests NONE   Final   Culture  Setup Time     Final   Value: 03/28/2013 01:05     Performed at [place]     Final   Value: NO GROWTH     Performed at [facility]   Culture     Final   Value: NO  GROWTH     Performed at [facility]   Report Status 03/28/2013 FINAL   Final  CULTURE, RESPIRATORY (NON-EXPECTORATED)     Status: None   Collection Time    03/27/13  6:34 PM      Result Value Range Status   Specimen Description SPUTUM   Final   Special Requests NONE   Final   Gram Stain     Final   Value: RARE WBC PRESENT,BOTH PMN AND MONONUCLEAR     NO SQUAMOUS EPITHELIAL CELLS SEEN     RARE GRAM POSITIVE COCCI IN PAIRS     RARE GRAM POSITIVE RODS     Performed at [facility]   Culture     Final   Value: NORMAL OROPHARYNGEAL FLORA     Performed at [facility]   Report Status 03/30/2013 FINAL   Final  MRSA PCR SCREENING     Status: None   Collection Time    03/28/13 10:42 PM      Result Value Range Status   MRSA by PCR NEGATIVE  NEGATIVE Final   Comment:            The GeneXpert MRSA Assay (FDA     approved for NASAL specimens     only), is one component of a     comprehensive MRSA colonization     surveillance program. It is not     intended to diagnose MRSA     infection nor to guide or     monitor treatment for     MRSA infections.    MISC: sirolimus level 23.9 (high) quantiferon negative PTH 78.5 (H) CMV pcr negative EBV pcr negative BK virus pcr negative Respiratory virus panel negative Sputum culture- normal oropharyngeal flora UCX NGTD BCX NGTD x 2 FENa- .2%  Dg Chest 2 View  03/27/2013   CLINICAL DATA:  Cough, hemoptysis  EXAM: CHEST  2 VIEW  COMPARISON:  03/23/2013  FINDINGS: Multifocal patchy airspace opacities, most prominent in the right perihilar region, mildly increased. Differential considerations include mild to moderate interstitial edema or possibly multifocal infection.  Suspected small left pleural effusion.  No pneumothorax.  Cardiomegaly.  Surgical clips overlying the bilateral neck.  IMPRESSION: Multifocal patchy airspace opacities, most prominent in the right perihilar region, mildly increased. Differential  considerations include mild to moderate interstitial edema or possibly multifocal infection.  Cardiomegaly with suspected small left pleural effusion.   Electronically Signed   By: Julian Hy M.D.   On: 03/27/2013 13:00   Dg Chest 2 View  03/23/2013   CLINICAL DATA:  Chest pain with history of congestive heart failure  EXAM: CHEST  2 VIEW  COMPARISON:  03/20/2013  FINDINGS: Heart size upper normal. Mild vascular congestion has developed when compared to the prior study. There is bilateral perihilar peribronchial cuffing with indistinct bilateral perihilar vessels and mild hazy opacity in the perihilar regions bilaterally. There are no pleural effusions. Surgical clips in the thyroid bed again identified.  IMPRESSION: Findings suggest development of congestive heart failure with mild pulmonary edema.   Electronically  Signed   By: Skipper Cliche M.D.   On: 03/23/2013 16:22   Dg Chest 2 View  03/20/2013   CLINICAL DATA:  Chest and back pain.  Hypertension.  EXAM: CHEST  2 VIEW  COMPARISON:  08/19/2011  FINDINGS: The lungs are clear without focal infiltrate, edema, pneumothorax or pleural effusion. Cardiopericardial silhouette is at upper limits of normal for size. Imaged bony structures of the thorax are intact. Surgical clips are seen in the thyroid bed and left axilla.  IMPRESSION: Stable.  No acute cardiopulmonary process.   Electronically Signed   By: Misty Stanley M.D.   On: 03/20/2013 07:45   Ct Chest Wo Contrast  03/27/2013   CLINICAL DATA:  Chest pain and shortness of breath for several months. Low-grade fever. Acute renal failure in transplant patient.  EXAM: CT CHEST WITHOUT CONTRAST  TECHNIQUE: Multidetector CT imaging of the chest was performed following the standard protocol without IV contrast.  COMPARISON:  Chest radiograph performed earlier today at 11:45 a.m.  FINDINGS: Diffuse fluffy bilateral central airspace opacity is seen, most prominent at the upper lung lobes bilaterally. This  is most compatible with diffuse pneumonia. Underlying edema cannot be excluded, given underlying small bilateral pleural effusions and interstitial prominence. No pneumothorax is seen.  Scattered coronary artery calcifications are seen. The heart is mildly enlarged. A mildly prominent right paratracheal node is seen, measuring 1.3 cm in short axis. No pericardial effusion is identified. The great vessels are grossly unremarkable in appearance. The patient is status post thyroidectomy. No axillary lymphadenopathy is seen.  The visualized portions of the liver and the spleen are unremarkable in appearance. Severe bilateral renal atrophy is seen, with scattered calcifications noted bilaterally. A small hiatal hernia is noted.  IMPRESSION: 1. Diffuse fluffy bilateral central airspace opacification, most prominent at the upper lung lobes. This is most compatible with diffuse multifocal pneumonia. Underlying edema cannot be excluded, given small bilateral pleural effusions and interstitial prominence. 2. Scattered coronary calcifications seen. 3. Mild cardiomegaly. 4. Mildly prominent right paratracheal node, measuring 1.3 cm in short axis. 5. Severe bilateral renal atrophy, with scattered associated calcifications. 6. Small hiatal hernia seen.   Electronically Signed   By: Garald Balding M.D.   On: 03/27/2013 21:48   Nm Bone Scan Whole Body  03/03/2013   CLINICAL DATA:  Pain.  Prostate cancer.  EXAM: NUCLEAR MEDICINE WHOLE BODY BONE SCAN  TECHNIQUE: Whole body anterior and posterior images were obtained approximately 3 hours after intravenous injection of radiopharmaceutical.  COMPARISON:  Bone scan 10/26/2007.  CT 02/28/2013.  RADIOPHARMACEUTICALS:  26.0 mCi Technetium-99 MDP  FINDINGS: Bilateral renal atrophy with a transplanted kidney right pelvis. No focal bony abnormality. No evidence of bony metastatic disease.  IMPRESSION: No evidence of bony metastatic disease or focal bony abnormality. Bilateral renal  atrophy. Transplanted kidney right pelvis.   Electronically Signed   By: Marcello Moores  Register   On: 03/03/2013 14:02   US Renal  03/22/2013   CLINICAL DATA:  Acute kidney injury. Patient with bilateral renal transplant.  EXAM: RENAL/URINARY TRACT ULTRASOUND COMPLETE  COMPARISON:  CT, 02/28/2013  FINDINGS: Right Kidney:  Length: 7.4 cm. Kidney is echogenic. 15 mm cyst arises from the upper pole. There are probable additional tiny cysts. No hydronephrosis.  Left Kidney:  Native left kidney not visualized.  Transplant kidney: In the right lower quadrant to right upper pelvis, a transplant kidney is visualized. Is normal in overall size measuring 10.7 cm. It has normal parenchymal echogenicity. Small cyst arises  from its lower pole measuring 1 cm. No other renal masses. No hydronephrosis.  Bladder:  Appears normal for degree of bladder distention.  IMPRESSION: 1. Transplant kidney is normal in appearance within a small lower pole cyst measuring 1 cm. 2. Atrophic native kidneys. Only the right was visualized. Is echogenic with at least 1 discrete small cysts. No hydronephrosis.   Electronically Signed   By: Lajean Manes M.D.   On: 03/22/2013 15:50   Nm Myocar Multi W/spect W/wall Motion / Ef  03/22/2013   CLINICAL DATA:  63yo AA man with PMH of HTN, DM2, CAD with stents, CAD, PVD s/p fem pop, prostate cancer treated with radiation, hx of thyroid cancer, h/o ESRD and kidney transplantation who presents for chest pain and back pain  EXAM: MYOCARDIAL IMAGING WITH SPECT (REST)  TECHNIQUE: Standard myocardial SPECT imaging was performed after resting intravenous injection of Tc-85m sestamibi. Quantitative gated imaging was also performed to evaluate left ventricular wall motion, and estimate left ventricular ejection fraction. As the patient's heart rate was 129 bpm when he presented for his "stress" images, no additional administration of Lexiscan was performed.  COMPARISON:  None.  FINDINGS: The Image quality is good.  There is mild interference from visceral racer uptake. The left ventricle is moderately to severely dilated. There is a moderate in size, moderate-to-severe in intensity inferior and inferolateral perfusion abnormality seen on both rest and "stress" images, possibly worse on the "stress" images. There is global hypokinesis of the left ventricle, with severe basal and mid inferior wall hypokinesis. EF 34%. The severity of LV dysfunction is out of proportion to the perfusion abnormality.  IMPRESSION: Interpretation of the study is severely limited by absence of a clear increase in coronary flow during acquisition of the "stress" images  There is evidence of severe ischemia at rest and/or scar in the right coronary artery distribution. This appears to be at least partly reversible.  The degree of LV dysfunction suggests multivessel CAD versus a component of superimposed nonischemic cardiomyopathy.  No previous studies are available for comparison   Electronically Signed   By: Sanda Klein   On: 03/22/2013 19:29   Nm Pulmonary Perf And Vent  03/20/2013   CLINICAL DATA:  Chest pain  EXAM: NUCLEAR MEDICINE VENTILATION - PERFUSION LUNG SCAN  Views: Anterior, posterior, left lateral, right lateral, RPO, LPO, RAO, LAO -ventilation and perfusion  Radionuclide: Technetium 25m DTPA -ventilation; Technetium 52m macroaggregated albumin-perfusion  Dose:  40.0 mCi-ventilation; 6.0 mCi-perfusion  Route of administration: Inhalation -ventilation; intravenous -perfusion  COMPARISON:  Chest radiograph March 20, 2013  FINDINGS: Radiotracer uptake on the ventilation study is homogeneous and symmetric bilaterally.  Radiotracer uptake on the perfusion study is homogeneous and symmetric bilaterally.  There is no appreciable ventilation/perfusion mismatch.  IMPRESSION: No ventilation or perfusion abnormalities. Very low probability of pulmonary embolus.   Electronically Signed   By: Lowella Grip M.D.   On: 03/20/2013 16:15    2D echo 03/22/2012- Study Conclusions  - Left ventricle: The cavity size was mildly dilated. Wall thickness was normal. Systolic function was mildly reduced. The estimated ejection fraction was in the range of 45% to 50%. There is hypokinesis of the lateral myocardium. Doppler parameters are consistent with restrictive physiology, indicative of decreased left ventricular diastolic compliance and/or increased left atrial pressure. Doppler parameters are consistent with high ventricular filling pressure. - Aortic valve: Valve mobility was restricted. There was mild to moderate stenosis. Trivial regurgitation. Valve area: 1.56cm^2(VTI). Valve area: 1.47cm^2 (Vmax). - Mitral  valve: Mild regurgitation. - Left atrium: The atrium was mildly dilated. - Pulmonary arteries: Systolic pressure was mildly increased. PA peak pressure: 74mm Hg (S).  Medications: I have reviewed the patient's current medications. Scheduled Meds: . aspirin  81 mg Oral Daily  . atorvastatin  80 mg Oral q1800  . diltiazem  120 mg Oral Daily  . feeding supplement (ENSURE COMPLETE)  237 mL Oral TID BM  . insulin aspart  0-9 Units Subcutaneous TID WC  . insulin glargine  24 Units Subcutaneous QHS  . isosorbide mononitrate  30 mg Oral Daily  . levofloxacin  750 mg Oral Q48H  . levothyroxine  150 mcg Oral Daily  . methocarbamol  500 mg Oral BID  . metoprolol tartrate  50 mg Oral Q6H  . mycophenolate  720 mg Oral BID  . pantoprazole  40 mg Oral Daily  . polyethylene glycol powder  1 Container Oral Once  . regadenoson  0.4 mg Intravenous Once  . sirolimus  1 mg Oral Daily  . sodium bicarbonate  650 mg Oral BID   Continuous Infusions:   PRN Meds:.sodium chloride, oxyCODONE, sodium chloride, white petrolatum  Assessment/Plan:  HCAP: Continues to improve. Mild residual cough. S/p 8 day course of antibiotics. -PT/OT- recommend HHPT/OT and walker  Acute GI bleed w/ dropping hemoglobin on 03/28/2013: No more  diarrhea or BRBPR. BE showed no definite colonic mass or other source of bleeding. There was however, marked colonic redundancy. This completes GI work up. Hb stable today 8.3.  -continue to hold heparin, ASA, plavix (1/20 first day without any of these medications)  Chest pain with hx of CAD, dCHF and current atrial tachycardia- Only one short episode of atrial tachycardia overnight. Patient has asymptomatic atrial tachycardia. EP saw patient yesterday and they recommend d/c telemetry and consolidating lopressor to 100mg  BID at discharge. 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 Sirolimus level drawn on 1/25 while patient was on multaq, diltiazem and sirolimus 2mg  daily was within therapeutic range (9.4). This AKI may represent ATN per nephrology. Nephrology recommends discharging patient today with f/u on Friday to check creatinine. Patient will be discharged on sirolimus 2mg  daily today. If Cr continues to rise, patient may need referral to [facility] for kidney biopsy. - renal decreased sirolimus to 1mg  daily on 1/27, though will increase sirolimus to 2mg  on discharge per nephrology's recommendation  Anion Gap Metabolic acidosis: Unclear etiology. Likely 2/2 CKD.  -encourage eating, continue ensure TID as per nutrition  -continue bicarb 650mg  BID  Metastatic Prostate cancer with back pain: Back pain continues, though seemingly well controlled on oxycodone. -Outpatient uro-oncology work up  HTN: stable -continue IMDUR 30mg  daily and metoprolol 50mg  q6h, diltiazem 120mg  daily (will consolidate metoprolol to 100mg  BID at  discharge per EP)  DM-II: A1c 7.8 on admission.CBGs stable overnight. -lantus 24U qHS  -SSI sensitive   DVT px: SCDs Diet: renal diet Dispo:  Discharge today  The patient does have a current PCP (Sueanne Margarita, MD) and does not need an [facility] hospital follow-up appointment after discharge.  The patient does not have transportation limitations that hinder transportation to clinic appointments.  Services Needed at time of discharge: Y = Yes, Blank =  No PT:   OT:   RN:   Equipment:   Other:     LOS: 16 days   Rebecca Eaton, MD 04/05/2013, 1:47 PM

## 2013-04-05 NOTE — Care Management Note (Addendum)
  Page 2 of 2   04/05/2013     3:50:36 PM   CARE MANAGEMENT NOTE 04/05/2013  Patient:  Joshua [name],Joshua [name]   Account Number:  [id number]  Date Initiated:  03/24/2013  Documentation initiated by:  Marvetta Gibbons  Subjective/Objective Assessment:   Pt admitted with c/p CHF     Action/Plan:   PTA pt lived at home with spouse   Anticipated DC Date:  03/26/2013   Anticipated DC Plan:  [place]  CM consult      [facility] Choice  HOME HEALTH   Choice offered to / List presented to:  C-1 Patient   DME arranged  [facility]      DME agency  [place] arranged  [facility].   Status of service:  Completed, signed off Medicare Important Message given?   (If response is "NO", the following Medicare IM given date fields will be blank) Date Medicare IM given:   Date Additional Medicare IM given:    Discharge Disposition:  [place]  Per UR Regulation:  Reviewed for med. necessity/level of care/duration of stay  If discussed at [place] of Stay Meetings, dates discussed:   03/28/2013  03/30/2013    Comments:  04/05/2013 Home DME:  walker HHS: RN,  PT, OT   - [facility] / [facility] updated on d/c date. Oxygen ordered [facility] / [place] SATURATION QUALIFICATIONS: (This note is used to comply with regulatory documentation for home oxygen) Patient Saturations on Room Air at Rest =94 Patient Saturations on Room Air while Ambulating =86 Patient Saturations on 2 Liters of oxygen while Ambulating =92 Please briefly explain why patient needs home oxygen:CHF/CAD sats dropped while ambulating ADD:  today [employer] RN, BSN, [place], CCM [phone] 04/05/2013   03/31/2013 CM Consult with patient and wife. Home DME:  None Home O2:  None PT RECS: Home health PT;Supervision for mobility/OOB ([facility] notified) OT RECS: Home health OT;Supervision - Intermittent (when  OOB/mobility) DME RECS: Rolling walker with 5" wheels  ([facility]/[place] notified) HHS Provider:  Elects [facility] Disposition:  Home with HHS: PT ADD: today. [address] RN, BSN, [place], [place] [phone] 03/30/2013  03-30-13 1517  Plan for EGD 03-31-13 when medically stable plan is for home. CM will continue ot monitor for disposition needs.   03-27-13 Pt with hyperkalemia and kayexalate given. Renal following and plan is for home when stable. Jacqlyn Krauss, RN,BSN [phone]

## 2013-04-06 ENCOUNTER — Ambulatory Visit: Payer: Medicare Other | Admitting: Internal Medicine

## 2013-04-06 ENCOUNTER — Encounter: Payer: Medicare Other | Admitting: Cardiology

## 2013-04-07 ENCOUNTER — Telehealth: Payer: Self-pay | Admitting: *Deleted

## 2013-04-07 NOTE — Telephone Encounter (Signed)
Pt's wife has Oxycodone rx; Dr Mechele Claude was paged - she came and signed the rx.

## 2013-04-07 NOTE — Telephone Encounter (Signed)
Call from pt's wife - stated pt received a rx for Oxycodone upon d/c from the hospital but when she took it to the pharmacy, the pharmacist told her it was not signed. I told her to bring the rx to clinic this afternoon; she agreed.

## 2013-04-11 LAB — PNEUMOCYSTIS JIROVECI SMEAR BY DFA: PNEUMOCYSTIS JIROVECI AG: NEGATIVE

## 2013-04-12 ENCOUNTER — Ambulatory Visit: Payer: Self-pay | Admitting: Internal Medicine

## 2013-04-12 ENCOUNTER — Encounter: Payer: Self-pay | Admitting: Internal Medicine

## 2013-04-13 ENCOUNTER — Ambulatory Visit (INDEPENDENT_AMBULATORY_CARE_PROVIDER_SITE_OTHER): Payer: Medicare Other | Admitting: Internal Medicine

## 2013-04-13 VITALS — BP 113/62 | HR 70 | Temp 96.9°F | Wt 192.6 lb

## 2013-04-13 DIAGNOSIS — E039 Hypothyroidism, unspecified: Secondary | ICD-10-CM

## 2013-04-13 DIAGNOSIS — E118 Type 2 diabetes mellitus with unspecified complications: Secondary | ICD-10-CM

## 2013-04-13 DIAGNOSIS — C61 Malignant neoplasm of prostate: Secondary | ICD-10-CM

## 2013-04-13 DIAGNOSIS — J189 Pneumonia, unspecified organism: Secondary | ICD-10-CM

## 2013-04-13 DIAGNOSIS — N186 End stage renal disease: Secondary | ICD-10-CM

## 2013-04-13 DIAGNOSIS — D649 Anemia, unspecified: Secondary | ICD-10-CM

## 2013-04-13 NOTE — Progress Notes (Signed)
Patient ID: Joshua [name], male   DOB: 12/27/1950, 63 y.o.   MRN: [id number] HPI The patient is a 63 y.o. male with a history of DM type 2, CAD, HTN, dCHF, PAT, papillary thyroid cancer s/p thyroidectomy, prostate cancer (likely w/ mets to spine), HLD, kidney transplantation who presents for a HFU visit.  Patient was hospitalized for over 2 weeks in January 2015 for a multitude of problems. In short patient was treated for HCAP, GI bleed requiring transfusion, paroxysmal atrial tachycardia, acute on chronic renal insufficiency in the setting of a prior kidney transplantation. I took care of this patient and I am quite familiar with the events that took place during his hospital stay, so please see my discharge summary for further details of his hospitalization.   Kidney transplantation- Upon discharge, patient's creatinine was 4.02 which is much higher than his baseline (1.8-1.9). This was thought to be ATN and potentially do to sirolimus toxicity vs multaq side effect. Patient was seen by his nephrologist, Dr. Florene Glen, on February 2 at which point he had a creatinine of 2.5 and a serolimus level within therapeutic range. Patiently is followed closely by nephrology for his kidney function.  GI bleed- at his visit with nephrology a few days ago, patient's hemoglobin was stable at 8.3 (discharge hemoglobin was also 8.3). Patient denies seeing any more blood in his stool. He had one episode of hemoptysis (scant blood) yesterday. He has continued taking his ASA 81mg  daily since discharge.  HCAP- Patient was thoroughly treated for HCAP during his hospitalization with IV vanc and cefepime that were eventually transitioned to levaquin (total 8 day course). Patient has some residual cough, which is only infrequently productive of sputum. As stated previously, patient had one episode of coughing up blood tinged sputum yesterday. He denies having any chest pain or shortness of breath. Patient was discharged on oxygen  2 L per minute to be worn only with activity as he was de-satting with ambulation. Continues to wear the oxygen. He will need a followup chest x-ray to ensure resolution of pneumonia the first week of March 2015.  Of note, wife is concenered because she thinks patient is talking to himself. However, it is unclear if this is occurring only while patient is sleeping or if this is also occurring while patient is awake. Per wife, patient may be slightly more confused and forgetful over the last month or so, however wife is a somewhat unclear historian so I was unable to elicit further detail. Patient adamantly denies having either auditory or visual hallucinations.   ROS: General: no fevers, chills, changes in weight, changes in appetite Skin: no rash HEENT: no blurry vision, hearing changes, sore throat Pulm: see HPI CV: some DOE; no chest pain, palpitations Abd: no abdominal pain, nausea/vomiting, diarrhea/constipation GU: no dysuria, hematuria, polyuria Ext: no arthralgias, myalgias Back: chronic back pain Neuro: no weakness, numbness, or tingling  Filed Vitals:   04/13/13 1508  BP: 113/62  Pulse: 70  Temp: 96.9 F (36.1 C)    Physical Exam General: alert, cooperative, and in no apparent distress HEENT: pupils equal round and reactive to light, vision grossly intact, oropharynx clear and non-erythematous  Neck: supple Lungs: clear to ascultation bilaterally, normal work of respiration, no wheezes, rales, ronchi Heart: regular rate and rhythm, 3/6 systolic murmur, gallops, or rubs Abdomen: soft, mild TTP over RLQ,  non-distended, normal bowel sounds Extremities: extremities are warm, but dry with diffuse excoriations; no pedal edema Neurologic: alert & oriented X3, cranial nerves  II-XII grossly intact, strength grossly intact, sensation intact to light touch  Current Outpatient Prescriptions on File Prior to Visit  Medication Sig Dispense Refill  . aspirin 81 MG chewable tablet Chew  81 mg by mouth daily.        Marland Kitchen atorvastatin (LIPITOR) 80 MG tablet TAKE 1 TABLET BY MOUTH EVERY DAY  30 tablet  0  . diltiazem (CARDIZEM CD) 120 MG 24 hr capsule Take 1 capsule (120 mg total) by mouth daily.  30 capsule  1  . insulin glargine (LANTUS) 100 UNIT/ML injection Inject 24 Units into the skin at bedtime.       . insulin lispro (HUMALOG) 100 UNIT/ML injection Inject 6 Units into the skin 3 (three) times daily as needed (for sugars over 280).       . isosorbide mononitrate (IMDUR) 30 MG 24 hr tablet Take 1 tablet (30 mg total) by mouth daily.  30 tablet  1  . levothyroxine (SYNTHROID, LEVOTHROID) 150 MCG tablet Take 150 mcg by mouth daily.      . methocarbamol (ROBAXIN) 500 MG tablet Take 1 tablet (500 mg total) by mouth 2 (two) times daily.  20 tablet  1  . metoprolol (LOPRESSOR) 50 MG tablet Take 2 tablets (100 mg total) by mouth 2 (two) times daily.  60 tablet  1  . mycophenolate (MYFORTIC) 180 MG EC tablet Take 720 mg by mouth 2 (two) times daily.       Marland Kitchen oxyCODONE 10 MG TABS Take 1 tablet (10 mg total) by mouth every 3 (three) hours as needed for severe pain (give with one percocet).  120 tablet  0  . pantoprazole (PROTONIX) 40 MG tablet Take 40 mg by mouth daily.      . sirolimus (RAPAMUNE) 1 MG tablet Take 2 tablets (2 mg total) by mouth daily.  60 tablet  0  . sodium bicarbonate 650 MG tablet Take 1 tablet (650 mg total) by mouth 2 (two) times daily.  60 tablet  1   No current facility-administered medications on file prior to visit.    Assessment/Plan

## 2013-04-13 NOTE — Progress Notes (Signed)
SATURATION QUALIFICATIONS: Patient Saturations on Room Air at Rest = 96%  Patient Saturations on Room Air while Ambulating = 90-91%  Patient Saturations on of oxygen while Ambulating = not obtained .Despina Hidden Cassady2/5/20154:54 PM

## 2013-04-13 NOTE — Assessment & Plan Note (Signed)
Patient still with residual cough that is sometimes productive of sputum. He reports having one episode of hemoptysis yesterday, there was only scant blood in the sputum. Patient will need a followup chest x-ray the first week in March to evaluate for resolution of his pneumonia. Patient was discharged on 2 L of oxygen to use w/ activity. I rechecked his oxygen requirement today. At rest, his oxygen saturation is 96%. However, with ambulation he desats to 90-91%. I instructed patient to continue wearing the oxygen as needed and especially with ambulation.

## 2013-04-13 NOTE — Patient Instructions (Signed)
Thank you for your visit. It was nice to see you again!  Please continue taking all of your medications as we discussed.  Please call our clinic if you have any blood in your stool.  Please return to clinic in 1 month. At this visit we will check a chest xray and your thyroid function.

## 2013-04-13 NOTE — Assessment & Plan Note (Signed)
Patient was seen by his uro-oncologist yesterday. They are arranging for a biopsy, but the patient is unsure if this is a prostate biopsy or a bone marrow biopsy. We will defer management to urology.

## 2013-04-13 NOTE — Assessment & Plan Note (Signed)
Will need to address diabetes maintenance issues at his next visit in 1 month.

## 2013-04-13 NOTE — Assessment & Plan Note (Signed)
Patient's hemoglobin was stable at 8.3 on 04/11/2013 when it was checked at his nephrology followup appointment (I had records faxed over to me and I will having them scanned into his chart). Patient has not noticed anymore blood in his stool. He does however endorse having a scant amount of blood in his sputum yesterday. No need for CBC recheck today.

## 2013-04-13 NOTE — Assessment & Plan Note (Signed)
Plan to recheck TSH at next visit as TSH was elevated in January 2015. If TSH remains elevated, will followup with the free T4 level. Patient's TSH is likely to be elevated in the setting of his chronic illness.

## 2013-04-13 NOTE — Assessment & Plan Note (Signed)
Patient received a kidney transplantation approximately 13 years ago at [facility]. His baseline creatinine prior to his recent admission was around 1.8. Patient developed acute on chronic kidney failure during his hospital stay. His discharge creatinine was 4.02 on January 28. His repeat creatinine and his nephrology followup visit was 2.5 on February 2. The sirolimus level was also within the therapeutic range at this visit. Therefore do not believe that his AKI was 2/2 sirolimus toxicity. It is likely that patient's increased creatinine was due to ATN- likely multifactorial in origin. Patient is followed closely by nephrology. No need to obtain a BMP today as I obtain the records from his kidney doctors and it is clear that his kidney function has trended in the right direction.

## 2013-04-18 ENCOUNTER — Telehealth: Payer: Self-pay | Admitting: Cardiology

## 2013-04-18 ENCOUNTER — Encounter (HOSPITAL_COMMUNITY): Payer: Self-pay | Admitting: Emergency Medicine

## 2013-04-18 ENCOUNTER — Telehealth: Payer: Self-pay | Admitting: *Deleted

## 2013-04-18 DIAGNOSIS — I5030 Unspecified diastolic (congestive) heart failure: Secondary | ICD-10-CM | POA: Insufficient documentation

## 2013-04-18 DIAGNOSIS — Z8673 Personal history of transient ischemic attack (TIA), and cerebral infarction without residual deficits: Secondary | ICD-10-CM | POA: Insufficient documentation

## 2013-04-18 DIAGNOSIS — I2510 Atherosclerotic heart disease of native coronary artery without angina pectoris: Secondary | ICD-10-CM | POA: Insufficient documentation

## 2013-04-18 DIAGNOSIS — I359 Nonrheumatic aortic valve disorder, unspecified: Secondary | ICD-10-CM | POA: Insufficient documentation

## 2013-04-18 DIAGNOSIS — D509 Iron deficiency anemia, unspecified: Secondary | ICD-10-CM | POA: Insufficient documentation

## 2013-04-18 DIAGNOSIS — Z7982 Long term (current) use of aspirin: Secondary | ICD-10-CM | POA: Insufficient documentation

## 2013-04-18 DIAGNOSIS — E1169 Type 2 diabetes mellitus with other specified complication: Secondary | ICD-10-CM | POA: Insufficient documentation

## 2013-04-18 DIAGNOSIS — Z9861 Coronary angioplasty status: Secondary | ICD-10-CM | POA: Insufficient documentation

## 2013-04-18 DIAGNOSIS — I120 Hypertensive chronic kidney disease with stage 5 chronic kidney disease or end stage renal disease: Secondary | ICD-10-CM | POA: Insufficient documentation

## 2013-04-18 DIAGNOSIS — N186 End stage renal disease: Secondary | ICD-10-CM | POA: Insufficient documentation

## 2013-04-18 DIAGNOSIS — Z9889 Other specified postprocedural states: Secondary | ICD-10-CM | POA: Insufficient documentation

## 2013-04-18 DIAGNOSIS — I739 Peripheral vascular disease, unspecified: Secondary | ICD-10-CM | POA: Insufficient documentation

## 2013-04-18 DIAGNOSIS — Z8546 Personal history of malignant neoplasm of prostate: Secondary | ICD-10-CM | POA: Insufficient documentation

## 2013-04-18 DIAGNOSIS — Z7902 Long term (current) use of antithrombotics/antiplatelets: Secondary | ICD-10-CM | POA: Insufficient documentation

## 2013-04-18 DIAGNOSIS — I498 Other specified cardiac arrhythmias: Secondary | ICD-10-CM | POA: Insufficient documentation

## 2013-04-18 DIAGNOSIS — E785 Hyperlipidemia, unspecified: Secondary | ICD-10-CM | POA: Insufficient documentation

## 2013-04-18 DIAGNOSIS — Z794 Long term (current) use of insulin: Secondary | ICD-10-CM | POA: Insufficient documentation

## 2013-04-18 DIAGNOSIS — E039 Hypothyroidism, unspecified: Secondary | ICD-10-CM | POA: Insufficient documentation

## 2013-04-18 DIAGNOSIS — Z940 Kidney transplant status: Secondary | ICD-10-CM | POA: Insufficient documentation

## 2013-04-18 LAB — GLUCOSE, CAPILLARY: GLUCOSE-CAPILLARY: 141 mg/dL — AB (ref 70–99)

## 2013-04-18 NOTE — Telephone Encounter (Signed)
New message         Joshua [name] would like to request a verbal order for a nursing visit for home health. Please give her a call

## 2013-04-18 NOTE — Telephone Encounter (Signed)
Patient wife called and stated that her husband passed out because his blood sugar dropped. Patient is not our patient, he no showed to his NP Appointment. Told her that she needed to hang up and call 911 and have them come and evaluate him. Also told her that she needed to schedule an appointment for him to establish with our practice. She agreed and hung up

## 2013-04-18 NOTE — Telephone Encounter (Signed)
Prior Authorization received from [facility] for Sirolimus 1 mg tab. Formulary and PA form placed in provider box for completion. Derl Barrow, RN

## 2013-04-18 NOTE — Telephone Encounter (Signed)
[facility] called to inform them that the Prior Authorization received at [facility] need to be sent to pt's neurologist per Dr. Lacinda Axon. Phone number [phone] to [facility].  Derl Barrow, RN

## 2013-04-18 NOTE — Telephone Encounter (Signed)
LVM for Erin to return call

## 2013-04-18 NOTE — ED Notes (Signed)
Pt. reports low blood  sugar this morning = 58., received glucose gel by EMS at home today , respirations unlabored / alert and oriented .

## 2013-04-19 ENCOUNTER — Emergency Department (HOSPITAL_COMMUNITY): Payer: Medicare Other

## 2013-04-19 ENCOUNTER — Telehealth: Payer: Self-pay | Admitting: *Deleted

## 2013-04-19 ENCOUNTER — Emergency Department (HOSPITAL_COMMUNITY)
Admission: EM | Admit: 2013-04-19 | Discharge: 2013-04-19 | Disposition: A | Discharge status: Home / Self Care | Payer: Medicare Other | Attending: Emergency Medicine | Admitting: Emergency Medicine

## 2013-04-19 DIAGNOSIS — R000 Tachycardia, unspecified: Secondary | ICD-10-CM

## 2013-04-19 DIAGNOSIS — E162 Hypoglycemia, unspecified: Secondary | ICD-10-CM

## 2013-04-19 DIAGNOSIS — N186 End stage renal disease: Secondary | ICD-10-CM

## 2013-04-19 DIAGNOSIS — D509 Iron deficiency anemia, unspecified: Secondary | ICD-10-CM

## 2013-04-19 LAB — COMPREHENSIVE METABOLIC PANEL
ALBUMIN: 2 g/dL — AB (ref 3.5–5.2)
ALT: 16 U/L (ref 0–53)
AST: 42 U/L — AB (ref 0–37)
Alkaline Phosphatase: 191 U/L — ABNORMAL HIGH (ref 39–117)
BILIRUBIN TOTAL: 0.4 mg/dL (ref 0.3–1.2)
BUN: 44 mg/dL — ABNORMAL HIGH (ref 6–23)
CO2: 25 mEq/L (ref 19–32)
Calcium: 6.3 mg/dL — CL (ref 8.4–10.5)
Chloride: 92 mEq/L — ABNORMAL LOW (ref 96–112)
Creatinine, Ser: 2.48 mg/dL — ABNORMAL HIGH (ref 0.50–1.35)
GFR calc Af Amer: 31 mL/min — ABNORMAL LOW (ref >90)
GFR calc non Af Amer: 26 mL/min — ABNORMAL LOW (ref >90)
Glucose, Bld: 82 mg/dL (ref 70–99)
Potassium: 4.2 mEq/L (ref 3.7–5.3)
Sodium: 140 mEq/L (ref 137–147)
Total Protein: 7.4 g/dL (ref 6.0–8.3)

## 2013-04-19 LAB — CBC WITH DIFFERENTIAL/PLATELET
Basophils Absolute: 0 10*3/uL (ref 0.0–0.1)
Basophils Relative: 0 % (ref 0–1)
Eosinophils Absolute: 0.1 10*3/uL (ref 0.0–0.7)
Eosinophils Relative: 1 % (ref 0–5)
HEMATOCRIT: 26.1 % — AB (ref 39.0–52.0)
Hemoglobin: 8 g/dL — ABNORMAL LOW (ref 13.0–17.0)
Lymphocytes Relative: 6 % — ABNORMAL LOW (ref 12–46)
Lymphs Abs: 0.8 10*3/uL (ref 0.7–4.0)
MCH: 22.9 pg — AB (ref 26.0–34.0)
MCHC: 30.7 g/dL (ref 30.0–36.0)
MCV: 74.6 fL — AB (ref 78.0–100.0)
MONO ABS: 1.7 10*3/uL — AB (ref 0.1–1.0)
Monocytes Relative: 13 % — ABNORMAL HIGH (ref 3–12)
NEUTROS ABS: 10.7 10*3/uL — AB (ref 1.7–7.7)
Neutrophils Relative %: 80 % — ABNORMAL HIGH (ref 43–77)
PLATELETS: 370 10*3/uL (ref 150–400)
RBC: 3.5 MIL/uL — AB (ref 4.22–5.81)
RDW: 20.6 % — ABNORMAL HIGH (ref 11.5–15.5)
WBC: 13.3 10*3/uL — ABNORMAL HIGH (ref 4.0–10.5)

## 2013-04-19 LAB — URINE MICROSCOPIC-ADD ON

## 2013-04-19 LAB — URINALYSIS, ROUTINE W REFLEX MICROSCOPIC
BILIRUBIN URINE: NEGATIVE
Glucose, UA: NEGATIVE mg/dL
KETONES UR: NEGATIVE mg/dL
Leukocytes, UA: NEGATIVE
Nitrite: NEGATIVE
PROTEIN: 100 mg/dL — AB
SPECIFIC GRAVITY, URINE: 1.024 (ref 1.005–1.030)
UROBILINOGEN UA: 0.2 mg/dL (ref 0.0–1.0)
pH: 5.5 (ref 5.0–8.0)

## 2013-04-19 LAB — POCT I-STAT TROPONIN I: TROPONIN I, POC: 0.06 ng/mL (ref 0.00–0.08)

## 2013-04-19 LAB — GLUCOSE, CAPILLARY
GLUCOSE-CAPILLARY: 108 mg/dL — AB (ref 70–99)
Glucose-Capillary: 168 mg/dL — ABNORMAL HIGH (ref 70–99)

## 2013-04-19 LAB — CG4 I-STAT (LACTIC ACID): Lactic Acid, Venous: 1.01 mmol/L (ref 0.5–2.2)

## 2013-04-19 MED ORDER — ACETAMINOPHEN 325 MG PO TABS
650.0000 mg | ORAL_TABLET | Freq: Once | ORAL | Status: AC
  Administered 2013-04-19: 650 mg via ORAL
  Filled 2013-04-19: qty 2

## 2013-04-19 MED ORDER — SODIUM CHLORIDE 0.9 % IV SOLN
Freq: Once | INTRAVENOUS | Status: AC
Start: 2013-04-19 — End: 2013-04-19
  Administered 2013-04-19: 01:00:00 via INTRAVENOUS

## 2013-04-19 MED ORDER — SODIUM CHLORIDE 0.9 % IV SOLN
Freq: Once | INTRAVENOUS | Status: AC
  Administered 2013-04-19: 01:00:00 via INTRAVENOUS

## 2013-04-19 NOTE — ED Notes (Signed)
Pt to ED for evaluation of hypoglycemic events at home for the past few days.  Pt states he had to call EMS this morning due to being diaphoretic and dizzy, found glucose to be in the 50's- given oral glucose and it came back up.  Pt denies any complaints at this time.  While on cardiac monitor pt noted to have tachycardic150bpm episode lasting 3 minutes, EKG captured and shown to Dr. Roxanne Mins while tachycardia continued- pt denied CP or SOB at any time.  Pt is right kidney transplant pt.

## 2013-04-19 NOTE — Telephone Encounter (Signed)
Erin notified with [facility]. She will contact internal medicine teaching services.

## 2013-04-19 NOTE — ED Notes (Signed)
Patient transported to X-ray 

## 2013-04-19 NOTE — ED Notes (Signed)
MD at bedside. °

## 2013-04-19 NOTE — ED Provider Notes (Signed)
CSN: [id number]     Arrival date & time 04/18/13  2021 History   First MD Initiated Contact with Patient 04/19/13 0216     Chief Complaint  Patient presents with  . Hypoglycemia     (Consider location/radiation/quality/duration/timing/severity/associated sxs/prior Treatment) Patient is a 63 y.o. male presenting with hypoglycemia. The history is provided by the patient and the spouse.  Hypoglycemia Patient and his wife are exceedingly poor historians but apparently he's been having problems with blood sugar dropping over the last 3 days. This has been punctuated with times when the blood sugar has been high. Today, his blood sugar was 390 1 in the morning he got a dose of insulin and then fell progressively through the day until it hit 3 tonight which is when EMS was called and he was brought to the ED. Yesterday, his blood sugar was low in the morning and his wife stated that he didn't recognize him. At that time blood sugar was 77 and he ate breakfast and later went up to 240. 2 days ago, sugar was high in the morning and 284 and then dropped to 100 4 in the evening. At that day, she was dull with a low-grade fever to 100.2 and had some chills and sweats. Chills and sweats have persisted but fever has not. There's been associated cough which has been productive of a small amount of clear sputum. He denies arthralgias or myalgias. There's been no nausea vomiting or diarrhea. There've been no known sick contacts. He has received all appropriate immunizations this year.  Past Medical History  Diagnosis Date  . Carotid artery occlusion   . ASCVD (arteriosclerotic cardiovascular disease)   . CHF (congestive heart failure)   . Anginal pain     last chest pain was a couple of months ago-just lasted less than a minute  . Stroke     "light stroke" about time of MI  . Renal failure     s/p renal transplant  . Aortic stenosis     moderate AS by 06/2011 echo (Dr. Fransico Him)  . Coronary artery  disease 03/2009    2 vessel s/p BMS to LAD in setting of NSTEMI, RCA chronically occluded  . PVD (peripheral vascular disease)     s/p fempop 1980's  . Hypothyroidism     s/p papillary thyroid CA excision  . Hypertension   . Hyperlipidemia   . Diastolic dysfunction   . Type II diabetes mellitus   . Prostate cancer     "radiation tx ~ 4-5 yr ago; cleared then came back a couple months ago" (03/20/2013)  . Plavix resistance     a. P2y12 365 in 03/2013.   Past Surgical History  Procedure Laterality Date  . Nephrectomy transplanted organ Bilateral 2001  . Pr vein bypass graft,aorto-fem-pop  1980    fem pop   . Endarterectomy  08/25/2011    Procedure: ENDARTERECTOMY CAROTID;  Surgeon: Angelia Mould, MD;  Location: [place];  Service: Vascular;  Laterality: Right;  . Thyroidectomy    . Coronary angioplasty with stent placement  03/2009    "1"  . Colonoscopy N/A 03/31/2013    Procedure: COLONOSCOPY;  Surgeon: Lafayette Dragon, MD;  Location: [facility] ENDOSCOPY;  Service: Endoscopy;  Laterality: N/A;  . Esophagogastroduodenoscopy N/A 03/31/2013    Procedure: ESOPHAGOGASTRODUODENOSCOPY (EGD);  Surgeon: Lafayette Dragon, MD;  Location: [facility] ENDOSCOPY;  Service: Endoscopy;  Laterality: N/A;   Family History  Problem Relation Age of Onset  . Diabetes  Mother   . Hyperlipidemia Mother   . Hypertension Mother   . Heart disease Mother     before age 15  . Hyperlipidemia Father   . Hypertension Father   . Heart disease Father   . Hyperlipidemia Sister   . Hypertension Sister   . Diabetes Brother   . Hypertension Brother   . Hyperlipidemia Brother   . Seizures Brother    History  Substance Use Topics  . Smoking status: Never Smoker   . Smokeless tobacco: Never Used  . Alcohol Use: No    Review of Systems  All other systems reviewed and are negative.      Allergies  Fentanyl and Propofol  Home Medications   Current Outpatient Rx  Name  Route  Sig  Dispense  Refill  . aspirin 81 MG  chewable tablet   Oral   Chew 81 mg by mouth daily.           Marland Kitchen atorvastatin (LIPITOR) 80 MG tablet      TAKE 1 TABLET BY MOUTH EVERY DAY   30 tablet   0   . diltiazem (CARDIZEM CD) 120 MG 24 hr capsule   Oral   Take 1 capsule (120 mg total) by mouth daily.   30 capsule   1   . insulin glargine (LANTUS) 100 UNIT/ML injection   Subcutaneous   Inject 24 Units into the skin at bedtime.          . insulin lispro (HUMALOG) 100 UNIT/ML injection   Subcutaneous   Inject 6 Units into the skin 3 (three) times daily as needed (for sugars over 280).          . isosorbide mononitrate (IMDUR) 30 MG 24 hr tablet   Oral   Take 1 tablet (30 mg total) by mouth daily.   30 tablet   1   . levothyroxine (SYNTHROID, LEVOTHROID) 150 MCG tablet   Oral   Take 150 mcg by mouth daily.         . methocarbamol (ROBAXIN) 500 MG tablet   Oral   Take 1 tablet (500 mg total) by mouth 2 (two) times daily.   20 tablet   1   . metoprolol (LOPRESSOR) 50 MG tablet   Oral   Take 2 tablets (100 mg total) by mouth 2 (two) times daily.   60 tablet   1   . mycophenolate (MYFORTIC) 180 MG EC tablet   Oral   Take 720 mg by mouth 2 (two) times daily.          Marland Kitchen oxyCODONE 10 MG TABS   Oral   Take 1 tablet (10 mg total) by mouth every 3 (three) hours as needed for severe pain (give with one percocet).   120 tablet   0   . pantoprazole (PROTONIX) 40 MG tablet   Oral   Take 40 mg by mouth daily.         . sirolimus (RAPAMUNE) 1 MG tablet   Oral   Take 2 tablets (2 mg total) by mouth daily.   60 tablet   0   . sodium bicarbonate 650 MG tablet   Oral   Take 1 tablet (650 mg total) by mouth 2 (two) times daily.   60 tablet   1    BP 139/71  Pulse 97  Temp(Src) 98.6 F (37 C) (Oral)  Resp 18  SpO2 100% Physical Exam  Nursing note and vitals reviewed.  63 year old  male, resting comfortably and in no acute distress. Vital signs are normal, but heart rate intermittently shoots  up to 140-150. Oxygen saturation is 100%, which is normal. Head is normocephalic and atraumatic. PERRLA, EOMI. Oropharynx is clear. Marked arcus senilis is present. Neck is nontender and supple without adenopathy or JVD. Back is nontender and there is no CVA tenderness. Lungs are clear without rales, wheezes, or rhonchi. Chest is nontender. Heart has regular rate and rhythm without murmur. Abdomen is soft, flat, nontender without masses or hepatosplenomegaly and peristalsis is normoactive. Extremities have no cyanosis or edema, full range of motion is present. AV fistula is present in the left upper arm with thrill present. Clotted AV fistula is present in the left forearm. Skin is warm and dry without rash. Neurologic: Mental status is normal, cranial nerves are intact, there are no motor or sensory deficits.   ED Course  Procedures (including critical care time) Labs Review Results for orders placed during the hospital encounter of 04/19/13  GLUCOSE, CAPILLARY      Result Value Ref Range   Glucose-Capillary 141 (*) 70 - 99 mg/dL  COMPREHENSIVE METABOLIC PANEL      Result Value Ref Range   Sodium 140  137 - 147 mEq/L   Potassium 4.2  3.7 - 5.3 mEq/L   Chloride 92 (*) 96 - 112 mEq/L   CO2 25  19 - 32 mEq/L   Glucose, Bld 82  70 - 99 mg/dL   BUN 44 (*) 6 - 23 mg/dL   Creatinine, Ser 2.48 (*) 0.50 - 1.35 mg/dL   Calcium 6.3 (*) 8.4 - 10.5 mg/dL   Total Protein 7.4  6.0 - 8.3 g/dL   Albumin 2.0 (*) 3.5 - 5.2 g/dL   AST 42 (*) 0 - 37 U/L   ALT 16  0 - 53 U/L   Alkaline Phosphatase 191 (*) 39 - 117 U/L   Total Bilirubin 0.4  0.3 - 1.2 mg/dL   GFR calc non Af Amer 26 (*) >90 mL/min   GFR calc Af Amer 31 (*) >90 mL/min  CBC WITH DIFFERENTIAL      Result Value Ref Range   WBC 13.3 (*) 4.0 - 10.5 K/uL   RBC 3.50 (*) 4.22 - 5.81 MIL/uL   Hemoglobin 8.0 (*) 13.0 - 17.0 g/dL   HCT 26.1 (*) 39.0 - 52.0 %   MCV 74.6 (*) 78.0 - 100.0 fL   MCH 22.9 (*) 26.0 - 34.0 pg   MCHC 30.7  30.0 -  36.0 g/dL   RDW 20.6 (*) 11.5 - 15.5 %   Platelets 370  150 - 400 K/uL   Neutrophils Relative % 80 (*) 43 - 77 %   Lymphocytes Relative 6 (*) 12 - 46 %   Monocytes Relative 13 (*) 3 - 12 %   Eosinophils Relative 1  0 - 5 %   Basophils Relative 0  0 - 1 %   Neutro Abs 10.7 (*) 1.7 - 7.7 K/uL   Lymphs Abs 0.8  0.7 - 4.0 K/uL   Monocytes Absolute 1.7 (*) 0.1 - 1.0 K/uL   Eosinophils Absolute 0.1  0.0 - 0.7 K/uL   Basophils Absolute 0.0  0.0 - 0.1 K/uL   RBC Morphology TARGET CELLS    URINALYSIS, ROUTINE W REFLEX MICROSCOPIC      Result Value Ref Range   Color, Urine YELLOW  YELLOW   APPearance CLOUDY (*) CLEAR   Specific Gravity, Urine 1.024  1.005 - 1.030  pH 5.5  5.0 - 8.0   Glucose, UA NEGATIVE  NEGATIVE mg/dL   Hgb urine dipstick MODERATE (*) NEGATIVE   Bilirubin Urine NEGATIVE  NEGATIVE   Ketones, ur NEGATIVE  NEGATIVE mg/dL   Protein, ur 100 (*) NEGATIVE mg/dL   Urobilinogen, UA 0.2  0.0 - 1.0 mg/dL   Nitrite NEGATIVE  NEGATIVE   Leukocytes, UA NEGATIVE  NEGATIVE  URINE MICROSCOPIC-ADD ON      Result Value Ref Range   Squamous Epithelial / LPF RARE  RARE   WBC, UA 0-2  <3 WBC/hpf   RBC / HPF 3-6  <3 RBC/hpf   Bacteria, UA FEW (*) RARE   Casts GRANULAR CAST (*) NEGATIVE  GLUCOSE, CAPILLARY      Result Value Ref Range   Glucose-Capillary 108 (*) 70 - 99 mg/dL  CG4 I-STAT (LACTIC ACID)      Result Value Ref Range   Lactic Acid, Venous 1.01  0.5 - 2.2 mmol/L  POCT I-STAT TROPONIN I      Result Value Ref Range   Troponin i, poc 0.06  0.00 - 0.08 ng/mL   Comment 3              Date: 04/19/2013  Rate: 147  Rhythm: sinus tachycardia  QRS Axis: normal  Intervals: QT prolonged  ST/T Wave abnormalities: normal  Conduction Disutrbances:none  Narrative Interpretation: Sinus tachycardia with prolonged QT interval. Compared with ECG of 04/05/2013, no significant changes are seen.  Old EKG Reviewed: unchanged    MDM   Final diagnoses:  Hypoglycemia  End stage renal  disease- s/p transplant  Iron deficiency anemia, unspecified    Labile blood sugars in patient who is status post renal transplant the insulin requirements may be fluctuating secondary to changes in renal function. Also, it seems that he has some kind of a viral illness which may have helped with his blood sugar higher. However, I am not sure that he is getting his medications as they are prescribed as the history is so disjointed. In the ED, he has not had any documented episodes of hypoglycemia. He is observed in the ED and blood sugars are checked several times and have not dropped. Renal function is about at his baseline and anemia is at his baseline. Hypocalcemia is noted which also seems in the general range that he has been in recently. With his labile blood sugars, he may need a smaller dose of his long-acting insulin and careful adjustment of his short acting insulin. Doses may need to be adjusted as his renal function fluctuates. He is referred back to his PCP for appropriate adjustments of his insulin dose.    Delora Fuel, MD [id number]

## 2013-04-19 NOTE — ED Notes (Signed)
Patient stated that 108 was low for his glucose.  Sandwich given to him and wife.

## 2013-04-19 NOTE — ED Notes (Signed)
Discharge instructions reviewed with patient and wife.  Voiced understanding.

## 2013-04-19 NOTE — Telephone Encounter (Signed)
To Dr. Turner, please advise.  

## 2013-04-19 NOTE — Telephone Encounter (Signed)
Erin from [facility] called stating they would like for pt to have home health nursing visit to go over medications with pt. If we ok then they will send paperwork to [place] to sign orders.

## 2013-04-19 NOTE — Telephone Encounter (Signed)
New problem   Pt need to speak to nurse concerning his heart rate is up.

## 2013-04-19 NOTE — Telephone Encounter (Signed)
Verbal ok was given to [facility] PT to order a nurse eval and then suggested treatment/ visits, is this ok with you?

## 2013-04-19 NOTE — Discharge Instructions (Signed)
She need to have your insulin dose adjusted so that your blood sugar doesn't drop so low. Please call the doctor who prescribes her insulin to get recommendations for how to adjust your dose.  Hypoglycemia (Low Blood Sugar) Hypoglycemia is when the glucose (sugar) in your blood is too low. Hypoglycemia can happen for many reasons. It can happen to people with or without diabetes. Hypoglycemia can develop quickly and can be a medical emergency.  CAUSES  Having hypoglycemia does not mean that you will develop diabetes. Different causes include:  Missed or delayed meals or not enough carbohydrates eaten.  Medication overdose. This could be by accident or deliberate. If by accident, your medication may need to be adjusted or changed.  Exercise or increased activity without adjustments in carbohydrates or medications.  A nerve disorder that affects body functions like your heart rate, blood pressure and digestion (autonomic neuropathy).  A condition where the stomach muscles do not function properly (gastroparesis). Therefore, medications may not absorb properly.  The inability to recognize the signs of hypoglycemia (hypoglycemic unawareness).  Absorption of insulin  may be altered.  Alcohol consumption.  Pregnancy/menstrual cycles/postpartum. This may be due to hormones.  Certain kinds of tumors. This is very rare. SYMPTOMS   Sweating.  Hunger.  Dizziness.  Blurred vision.  Drowsiness.  Weakness.  Headache.  Rapid heart beat.  Shakiness.  Nervousness. DIAGNOSIS  Diagnosis is made by monitoring blood glucose in one or all of the following ways:  Fingerstick blood glucose monitoring.  Laboratory results. TREATMENT  If you think your blood glucose is low:  Check your blood glucose, if possible. If it is less than 70 mg/dl, take one of the following:  3-4 glucose tablets.   cup juice (prefer clear like apple).   cup "regular" soda pop.  1 cup milk.  -1  tube of glucose gel.  5-6 hard candies.  Do not over treat because your blood glucose (sugar) will only go too high.  Wait 15 minutes and recheck your blood glucose. If it is still less than 70 mg/dl (or below your target range), repeat treatment.  Eat a snack if it is more than one hour until your next meal. Sometimes, your blood glucose may go so low that you are unable to treat yourself. You may need someone to help you. You may even pass out or be unable to swallow. This may require you to get an injection of glucagon, which raises the blood glucose. HOME CARE INSTRUCTIONS  Check blood glucose as recommended by your caregiver.  Take medication as prescribed by your caregiver.  Follow your meal plan. Do not skip meals. Eat on time.  If you are going to drink alcohol, drink it only with meals.  Check your blood glucose before driving.  Check your blood glucose before and after exercise. If you exercise longer or different than usual, be sure to check blood glucose more frequently.  Always carry treatment with you. Glucose tablets are the easiest to carry.  Always wear medical alert jewelry or carry some form of identification that states that you have diabetes. This will alert people that you have diabetes. If you have hypoglycemia, they will have a better idea on what to do. SEEK MEDICAL CARE IF:   You are having problems keeping your blood sugar at target range.  You are having frequent episodes of hypoglycemia.  You feel you might be having side effects from your medicines.  You have symptoms of an illness that is  not improving after 3-4 days.  You notice a change in vision or a new problem with your vision. SEEK IMMEDIATE MEDICAL CARE IF:   You are a family member or friend of a person whose blood glucose goes below 70 mg/dl and is accompanied by:  Confusion.  A change in mental status.  The inability to swallow.  Passing out. Document Released: 02/23/2005  Document Revised: 05/18/2011 Document Reviewed: 06/22/2011 [facility] Patient Information 2014 [place], [place].

## 2013-04-19 NOTE — Telephone Encounter (Signed)
This needs to be filled out and followed by PCP who is Internal medicine teaching service

## 2013-04-20 ENCOUNTER — Other Ambulatory Visit: Payer: Self-pay | Admitting: Radiology

## 2013-04-20 ENCOUNTER — Other Ambulatory Visit: Payer: Self-pay | Admitting: Urology

## 2013-04-20 ENCOUNTER — Encounter (HOSPITAL_COMMUNITY): Payer: Self-pay | Admitting: Pharmacy Technician

## 2013-04-20 DIAGNOSIS — C7951 Secondary malignant neoplasm of bone: Secondary | ICD-10-CM

## 2013-04-20 DIAGNOSIS — C61 Malignant neoplasm of prostate: Secondary | ICD-10-CM

## 2013-04-20 DIAGNOSIS — C7952 Secondary malignant neoplasm of bone marrow: Principal | ICD-10-CM

## 2013-04-20 NOTE — Telephone Encounter (Signed)
Absolutely. Thanks a lot.

## 2013-04-21 ENCOUNTER — Emergency Department (HOSPITAL_COMMUNITY): Payer: Medicare Other

## 2013-04-21 ENCOUNTER — Encounter (HOSPITAL_COMMUNITY): Payer: Self-pay | Admitting: Emergency Medicine

## 2013-04-21 ENCOUNTER — Inpatient Hospital Stay (HOSPITAL_COMMUNITY)
Admission: EM | Admit: 2013-04-21 | Discharge: 2013-05-15 | DRG: 981 | Disposition: A | Discharge status: Hospice | Payer: Medicare Other | Attending: Internal Medicine | Admitting: Internal Medicine

## 2013-04-21 DIAGNOSIS — L299 Pruritus, unspecified: Secondary | ICD-10-CM | POA: Diagnosis not present

## 2013-04-21 DIAGNOSIS — J189 Pneumonia, unspecified organism: Secondary | ICD-10-CM | POA: Diagnosis present

## 2013-04-21 DIAGNOSIS — Z515 Encounter for palliative care: Secondary | ICD-10-CM

## 2013-04-21 DIAGNOSIS — I359 Nonrheumatic aortic valve disorder, unspecified: Secondary | ICD-10-CM | POA: Diagnosis present

## 2013-04-21 DIAGNOSIS — M549 Dorsalgia, unspecified: Secondary | ICD-10-CM

## 2013-04-21 DIAGNOSIS — C7952 Secondary malignant neoplasm of bone marrow: Secondary | ICD-10-CM

## 2013-04-21 DIAGNOSIS — Z66 Do not resuscitate: Secondary | ICD-10-CM | POA: Diagnosis not present

## 2013-04-21 DIAGNOSIS — J9819 Other pulmonary collapse: Secondary | ICD-10-CM | POA: Diagnosis present

## 2013-04-21 DIAGNOSIS — N179 Acute kidney failure, unspecified: Secondary | ICD-10-CM | POA: Diagnosis not present

## 2013-04-21 DIAGNOSIS — L8995 Pressure ulcer of unspecified site, unstageable: Secondary | ICD-10-CM | POA: Diagnosis present

## 2013-04-21 DIAGNOSIS — C7951 Secondary malignant neoplasm of bone: Secondary | ICD-10-CM | POA: Diagnosis present

## 2013-04-21 DIAGNOSIS — L89109 Pressure ulcer of unspecified part of back, unspecified stage: Secondary | ICD-10-CM | POA: Diagnosis present

## 2013-04-21 DIAGNOSIS — I739 Peripheral vascular disease, unspecified: Secondary | ICD-10-CM | POA: Diagnosis present

## 2013-04-21 DIAGNOSIS — I498 Other specified cardiac arrhythmias: Secondary | ICD-10-CM | POA: Diagnosis present

## 2013-04-21 DIAGNOSIS — Z9861 Coronary angioplasty status: Secondary | ICD-10-CM

## 2013-04-21 DIAGNOSIS — I2510 Atherosclerotic heart disease of native coronary artery without angina pectoris: Secondary | ICD-10-CM | POA: Diagnosis present

## 2013-04-21 DIAGNOSIS — X58XXXA Exposure to other specified factors, initial encounter: Secondary | ICD-10-CM | POA: Diagnosis present

## 2013-04-21 DIAGNOSIS — D649 Anemia, unspecified: Secondary | ICD-10-CM

## 2013-04-21 DIAGNOSIS — I471 Supraventricular tachycardia: Secondary | ICD-10-CM | POA: Diagnosis present

## 2013-04-21 DIAGNOSIS — N19 Unspecified kidney failure: Secondary | ICD-10-CM

## 2013-04-21 DIAGNOSIS — Z6826 Body mass index (BMI) 26.0-26.9, adult: Secondary | ICD-10-CM

## 2013-04-21 DIAGNOSIS — I6529 Occlusion and stenosis of unspecified carotid artery: Secondary | ICD-10-CM | POA: Diagnosis present

## 2013-04-21 DIAGNOSIS — I10 Essential (primary) hypertension: Secondary | ICD-10-CM | POA: Diagnosis present

## 2013-04-21 DIAGNOSIS — I5043 Acute on chronic combined systolic (congestive) and diastolic (congestive) heart failure: Principal | ICD-10-CM | POA: Diagnosis present

## 2013-04-21 DIAGNOSIS — R160 Hepatomegaly, not elsewhere classified: Secondary | ICD-10-CM

## 2013-04-21 DIAGNOSIS — Z85850 Personal history of malignant neoplasm of thyroid: Secondary | ICD-10-CM

## 2013-04-21 DIAGNOSIS — E118 Type 2 diabetes mellitus with unspecified complications: Secondary | ICD-10-CM | POA: Diagnosis present

## 2013-04-21 DIAGNOSIS — I2589 Other forms of chronic ischemic heart disease: Secondary | ICD-10-CM | POA: Diagnosis present

## 2013-04-21 DIAGNOSIS — D696 Thrombocytopenia, unspecified: Secondary | ICD-10-CM | POA: Diagnosis not present

## 2013-04-21 DIAGNOSIS — R609 Edema, unspecified: Secondary | ICD-10-CM | POA: Diagnosis present

## 2013-04-21 DIAGNOSIS — E8809 Other disorders of plasma-protein metabolism, not elsewhere classified: Secondary | ICD-10-CM | POA: Diagnosis present

## 2013-04-21 DIAGNOSIS — I509 Heart failure, unspecified: Secondary | ICD-10-CM | POA: Diagnosis present

## 2013-04-21 DIAGNOSIS — K5900 Constipation, unspecified: Secondary | ICD-10-CM | POA: Diagnosis not present

## 2013-04-21 DIAGNOSIS — N039 Chronic nephritic syndrome with unspecified morphologic changes: Secondary | ICD-10-CM

## 2013-04-21 DIAGNOSIS — I5189 Other ill-defined heart diseases: Secondary | ICD-10-CM

## 2013-04-21 DIAGNOSIS — R64 Cachexia: Secondary | ICD-10-CM | POA: Diagnosis present

## 2013-04-21 DIAGNOSIS — Z8673 Personal history of transient ischemic attack (TIA), and cerebral infarction without residual deficits: Secondary | ICD-10-CM

## 2013-04-21 DIAGNOSIS — R5381 Other malaise: Secondary | ICD-10-CM | POA: Diagnosis present

## 2013-04-21 DIAGNOSIS — R627 Adult failure to thrive: Secondary | ICD-10-CM | POA: Diagnosis present

## 2013-04-21 DIAGNOSIS — Z923 Personal history of irradiation: Secondary | ICD-10-CM

## 2013-04-21 DIAGNOSIS — D509 Iron deficiency anemia, unspecified: Secondary | ICD-10-CM | POA: Diagnosis present

## 2013-04-21 DIAGNOSIS — R404 Transient alteration of awareness: Secondary | ICD-10-CM | POA: Diagnosis not present

## 2013-04-21 DIAGNOSIS — E785 Hyperlipidemia, unspecified: Secondary | ICD-10-CM | POA: Diagnosis present

## 2013-04-21 DIAGNOSIS — R188 Other ascites: Secondary | ICD-10-CM

## 2013-04-21 DIAGNOSIS — E43 Unspecified severe protein-calorie malnutrition: Secondary | ICD-10-CM | POA: Diagnosis present

## 2013-04-21 DIAGNOSIS — N189 Chronic kidney disease, unspecified: Secondary | ICD-10-CM

## 2013-04-21 DIAGNOSIS — E8770 Fluid overload, unspecified: Secondary | ICD-10-CM | POA: Diagnosis present

## 2013-04-21 DIAGNOSIS — I5032 Chronic diastolic (congestive) heart failure: Secondary | ICD-10-CM | POA: Diagnosis present

## 2013-04-21 DIAGNOSIS — Z951 Presence of aortocoronary bypass graft: Secondary | ICD-10-CM

## 2013-04-21 DIAGNOSIS — G253 Myoclonus: Secondary | ICD-10-CM | POA: Diagnosis present

## 2013-04-21 DIAGNOSIS — E039 Hypothyroidism, unspecified: Secondary | ICD-10-CM | POA: Diagnosis present

## 2013-04-21 DIAGNOSIS — Z7982 Long term (current) use of aspirin: Secondary | ICD-10-CM

## 2013-04-21 DIAGNOSIS — Z940 Kidney transplant status: Secondary | ICD-10-CM

## 2013-04-21 DIAGNOSIS — D631 Anemia in chronic kidney disease: Secondary | ICD-10-CM | POA: Diagnosis present

## 2013-04-21 DIAGNOSIS — E1169 Type 2 diabetes mellitus with other specified complication: Secondary | ICD-10-CM | POA: Diagnosis present

## 2013-04-21 DIAGNOSIS — C61 Malignant neoplasm of prostate: Secondary | ICD-10-CM | POA: Diagnosis present

## 2013-04-21 DIAGNOSIS — N186 End stage renal disease: Secondary | ICD-10-CM | POA: Diagnosis present

## 2013-04-21 DIAGNOSIS — I255 Ischemic cardiomyopathy: Secondary | ICD-10-CM | POA: Diagnosis present

## 2013-04-21 DIAGNOSIS — I252 Old myocardial infarction: Secondary | ICD-10-CM

## 2013-04-21 DIAGNOSIS — I120 Hypertensive chronic kidney disease with stage 5 chronic kidney disease or end stage renal disease: Secondary | ICD-10-CM | POA: Diagnosis present

## 2013-04-21 DIAGNOSIS — C787 Secondary malignant neoplasm of liver and intrahepatic bile duct: Secondary | ICD-10-CM | POA: Diagnosis present

## 2013-04-21 DIAGNOSIS — Z992 Dependence on renal dialysis: Secondary | ICD-10-CM

## 2013-04-21 DIAGNOSIS — Z794 Long term (current) use of insulin: Secondary | ICD-10-CM

## 2013-04-21 DIAGNOSIS — D638 Anemia in other chronic diseases classified elsewhere: Secondary | ICD-10-CM | POA: Diagnosis present

## 2013-04-21 DIAGNOSIS — Z8546 Personal history of malignant neoplasm of prostate: Secondary | ICD-10-CM

## 2013-04-21 DIAGNOSIS — S22009A Unspecified fracture of unspecified thoracic vertebra, initial encounter for closed fracture: Secondary | ICD-10-CM | POA: Diagnosis present

## 2013-04-21 DIAGNOSIS — Z9981 Dependence on supplemental oxygen: Secondary | ICD-10-CM

## 2013-04-21 DIAGNOSIS — E875 Hyperkalemia: Secondary | ICD-10-CM | POA: Diagnosis not present

## 2013-04-21 HISTORY — DX: End stage renal disease: N18.6

## 2013-04-21 LAB — COMPREHENSIVE METABOLIC PANEL
ALT: 16 U/L (ref 0–53)
AST: 38 U/L — ABNORMAL HIGH (ref 0–37)
Albumin: 1.8 g/dL — ABNORMAL LOW (ref 3.5–5.2)
Alkaline Phosphatase: 163 U/L — ABNORMAL HIGH (ref 39–117)
BILIRUBIN TOTAL: 0.4 mg/dL (ref 0.3–1.2)
BUN: 45 mg/dL — AB (ref 6–23)
CHLORIDE: 93 meq/L — AB (ref 96–112)
CO2: 24 mEq/L (ref 19–32)
CREATININE: 2.24 mg/dL — AB (ref 0.50–1.35)
Calcium: 6.1 mg/dL — CL (ref 8.4–10.5)
GFR calc Af Amer: 35 mL/min — ABNORMAL LOW (ref >90)
GFR calc non Af Amer: 30 mL/min — ABNORMAL LOW (ref >90)
Glucose, Bld: 132 mg/dL — ABNORMAL HIGH (ref 70–99)
Potassium: 4.5 mEq/L (ref 3.7–5.3)
Sodium: 138 mEq/L (ref 137–147)
Total Protein: 7 g/dL (ref 6.0–8.3)

## 2013-04-21 LAB — CBC WITH DIFFERENTIAL/PLATELET
BASOS ABS: 0 10*3/uL (ref 0.0–0.1)
Basophils Relative: 0 % (ref 0–1)
EOS ABS: 0.1 10*3/uL (ref 0.0–0.7)
EOS PCT: 1 % (ref 0–5)
HCT: 22.9 % — ABNORMAL LOW (ref 39.0–52.0)
Hemoglobin: 7.1 g/dL — ABNORMAL LOW (ref 13.0–17.0)
LYMPHS ABS: 1.9 10*3/uL (ref 0.7–4.0)
Lymphocytes Relative: 14 % (ref 12–46)
MCH: 23.1 pg — ABNORMAL LOW (ref 26.0–34.0)
MCHC: 31 g/dL (ref 30.0–36.0)
MCV: 74.4 fL — AB (ref 78.0–100.0)
MONO ABS: 0.5 10*3/uL (ref 0.1–1.0)
Monocytes Relative: 4 % (ref 3–12)
Neutro Abs: 10.8 10*3/uL — ABNORMAL HIGH (ref 1.7–7.7)
Neutrophils Relative %: 81 % — ABNORMAL HIGH (ref 43–77)
PLATELETS: 364 10*3/uL (ref 150–400)
RBC: 3.08 MIL/uL — AB (ref 4.22–5.81)
RDW: 21.1 % — AB (ref 11.5–15.5)
WBC: 13.3 10*3/uL — ABNORMAL HIGH (ref 4.0–10.5)

## 2013-04-21 LAB — PREPARE RBC (CROSSMATCH)

## 2013-04-21 LAB — GLUCOSE, CAPILLARY: Glucose-Capillary: 172 mg/dL — ABNORMAL HIGH (ref 70–99)

## 2013-04-21 LAB — TROPONIN I: Troponin I: <0.3 ng/mL (ref <0.30)

## 2013-04-21 LAB — PRO B NATRIURETIC PEPTIDE: PRO B NATRI PEPTIDE: 29444 pg/mL — AB (ref 0–125)

## 2013-04-21 MED ORDER — SODIUM CHLORIDE 0.9 % IJ SOLN
3.0000 mL | Freq: Two times a day (BID) | INTRAMUSCULAR | Status: DC
  Administered 2013-04-21 – 2013-04-28 (×14): 3 mL via INTRAVENOUS

## 2013-04-21 MED ORDER — ISOSORBIDE MONONITRATE ER 30 MG PO TB24
30.0000 mg | ORAL_TABLET | Freq: Every morning | ORAL | Status: DC
  Administered 2013-04-22 – 2013-04-24 (×3): 30 mg via ORAL
  Filled 2013-04-21 (×4): qty 1

## 2013-04-21 MED ORDER — FUROSEMIDE 10 MG/ML IJ SOLN
20.0000 mg | Freq: Once | INTRAMUSCULAR | Status: AC
  Administered 2013-04-21: 20 mg via INTRAVENOUS
  Filled 2013-04-21: qty 2

## 2013-04-21 MED ORDER — INSULIN ASPART 100 UNIT/ML ~~LOC~~ SOLN
0.0000 [IU] | Freq: Three times a day (TID) | SUBCUTANEOUS | Status: DC
Start: 2013-04-22 — End: 2013-04-28
  Administered 2013-04-22 (×2): 2 [IU] via SUBCUTANEOUS
  Administered 2013-04-22: 1 [IU] via SUBCUTANEOUS
  Administered 2013-04-23 – 2013-04-24 (×4): 2 [IU] via SUBCUTANEOUS
  Administered 2013-04-25 – 2013-04-26 (×4): 1 [IU] via SUBCUTANEOUS
  Administered 2013-04-27 (×2): 3 [IU] via SUBCUTANEOUS
  Administered 2013-04-27: 2 [IU] via SUBCUTANEOUS

## 2013-04-21 MED ORDER — FUROSEMIDE 10 MG/ML IJ SOLN
40.0000 mg | Freq: Once | INTRAMUSCULAR | Status: AC
  Administered 2013-04-21: 40 mg via INTRAVENOUS
  Filled 2013-04-21: qty 4

## 2013-04-21 MED ORDER — ALBUTEROL SULFATE (2.5 MG/3ML) 0.083% IN NEBU
2.5000 mg | INHALATION_SOLUTION | Freq: Four times a day (QID) | RESPIRATORY_TRACT | Status: DC | PRN
  Administered 2013-04-22 – 2013-04-27 (×10): 2.5 mg via RESPIRATORY_TRACT
  Filled 2013-04-21 (×10): qty 3

## 2013-04-21 MED ORDER — OXYCODONE HCL 5 MG PO TABS: 10.0000 mg | ORAL_TABLET | ORAL | Status: DC | PRN

## 2013-04-21 MED ORDER — LEVOTHYROXINE SODIUM 150 MCG PO TABS
150.0000 ug | ORAL_TABLET | Freq: Every day | ORAL | Status: DC
  Administered 2013-04-22 – 2013-05-14 (×22): 150 ug via ORAL
  Filled 2013-04-21 (×26): qty 1

## 2013-04-21 MED ORDER — ATORVASTATIN CALCIUM 80 MG PO TABS
80.0000 mg | ORAL_TABLET | Freq: Every day | ORAL | Status: DC
  Administered 2013-04-22 – 2013-05-13 (×21): 80 mg via ORAL
  Filled 2013-04-21 (×24): qty 1

## 2013-04-21 MED ORDER — METOPROLOL TARTRATE 100 MG PO TABS
100.0000 mg | ORAL_TABLET | Freq: Two times a day (BID) | ORAL | Status: DC
  Administered 2013-04-21 – 2013-05-06 (×31): 100 mg via ORAL
  Filled 2013-04-21 (×35): qty 1

## 2013-04-21 MED ORDER — PANTOPRAZOLE SODIUM 40 MG PO TBEC
40.0000 mg | DELAYED_RELEASE_TABLET | Freq: Every day | ORAL | Status: DC
  Administered 2013-04-21 – 2013-05-14 (×21): 40 mg via ORAL
  Filled 2013-04-21 (×20): qty 1

## 2013-04-21 MED ORDER — SODIUM CHLORIDE 0.9 % IV SOLN
1.0000 g | Freq: Once | INTRAVENOUS | Status: AC
  Administered 2013-04-21: 1 g via INTRAVENOUS
  Filled 2013-04-21: qty 10

## 2013-04-21 MED ORDER — METOPROLOL TARTRATE 1 MG/ML IV SOLN
2.5000 mg | INTRAVENOUS | Status: AC | PRN
  Administered 2013-04-22 (×2): 2.5 mg via INTRAVENOUS
  Filled 2013-04-21 (×2): qty 5

## 2013-04-21 MED ORDER — FUROSEMIDE 10 MG/ML IJ SOLN
20.0000 mg | Freq: Once | INTRAMUSCULAR | Status: AC
  Administered 2013-04-21: 20 mg via INTRAVENOUS

## 2013-04-21 MED ORDER — DILTIAZEM HCL ER COATED BEADS 120 MG PO CP24
120.0000 mg | ORAL_CAPSULE | Freq: Every morning | ORAL | Status: DC
  Administered 2013-04-22 – 2013-04-26 (×5): 120 mg via ORAL
  Filled 2013-04-21 (×5): qty 1

## 2013-04-21 MED ORDER — DIPHENHYDRAMINE HCL 25 MG PO CAPS
25.0000 mg | ORAL_CAPSULE | Freq: Four times a day (QID) | ORAL | Status: DC | PRN
  Administered 2013-04-21 – 2013-04-29 (×5): 25 mg via ORAL
  Filled 2013-04-21 (×5): qty 1

## 2013-04-21 MED ORDER — MYCOPHENOLATE SODIUM 180 MG PO TBEC
720.0000 mg | DELAYED_RELEASE_TABLET | Freq: Two times a day (BID) | ORAL | Status: DC
  Administered 2013-04-21 – 2013-05-14 (×44): 720 mg via ORAL
  Filled 2013-04-21 (×51): qty 4

## 2013-04-21 MED ORDER — INSULIN GLARGINE 100 UNIT/ML ~~LOC~~ SOLN
15.0000 [IU] | Freq: Every day | SUBCUTANEOUS | Status: DC
  Administered 2013-04-21 – 2013-04-29 (×8): 15 [IU] via SUBCUTANEOUS
  Filled 2013-04-21 (×11): qty 0.15

## 2013-04-21 MED ORDER — SIROLIMUS 1 MG PO TABS
1.0000 mg | ORAL_TABLET | Freq: Every morning | ORAL | Status: DC
  Administered 2013-04-22 – 2013-04-23 (×2): 1 mg via ORAL
  Filled 2013-04-21 (×2): qty 1

## 2013-04-21 NOTE — ED Provider Notes (Signed)
CSN: [id number]     Arrival date & time 04/21/13  1207 History   First MD Initiated Contact with Patient 04/21/13 1336     Chief Complaint  Patient presents with  . Leg Swelling     (Consider location/radiation/quality/duration/timing/severity/associated sxs/prior Treatment) HPI Comments: 63 yo male with stroke, esrd, CHF, HCAP, anemia, on mycophenolate for renal transplant, produces urine presents with bilateral leg swelling and sob gradually worsening the past 2 days.  This is more severe than normal.  No recent surgery or blood clot hx. Unsure wt gain.  NO known liver issues, no fevers.  Mild orthopnea. Pt has not missed immunosuppressant medicines.   The history is provided by the patient and the spouse.    Past Medical History  Diagnosis Date  . Carotid artery occlusion   . ASCVD (arteriosclerotic cardiovascular disease)   . CHF (congestive heart failure)   . Anginal pain     last chest pain was a couple of months ago-just lasted less than a minute  . Stroke     "light stroke" about time of MI  . Renal failure     s/p renal transplant  . Aortic stenosis     moderate AS by 06/2011 echo (Dr. Fransico Him)  . Coronary artery disease 03/2009    2 vessel s/p BMS to LAD in setting of NSTEMI, RCA chronically occluded  . PVD (peripheral vascular disease)     s/p fempop 1980's  . Hypothyroidism     s/p papillary thyroid CA excision  . Hypertension   . Hyperlipidemia   . Diastolic dysfunction   . Type II diabetes mellitus   . Prostate cancer     "radiation tx ~ 4-5 yr ago; cleared then came back a couple months ago" (03/20/2013)  . Plavix resistance     a. P2y12 365 in 03/2013.   Past Surgical History  Procedure Laterality Date  . Nephrectomy transplanted organ Bilateral 2001  . Pr vein bypass graft,aorto-fem-pop  1980    fem pop   . Endarterectomy  08/25/2011    Procedure: ENDARTERECTOMY CAROTID;  Surgeon: Angelia Mould, MD;  Location: [place];  Service: Vascular;   Laterality: Right;  . Thyroidectomy    . Coronary angioplasty with stent placement  03/2009    "1"  . Colonoscopy N/A 03/31/2013    Procedure: COLONOSCOPY;  Surgeon: Lafayette Dragon, MD;  Location: [facility] ENDOSCOPY;  Service: Endoscopy;  Laterality: N/A;  . Esophagogastroduodenoscopy N/A 03/31/2013    Procedure: ESOPHAGOGASTRODUODENOSCOPY (EGD);  Surgeon: Lafayette Dragon, MD;  Location: [facility] ENDOSCOPY;  Service: Endoscopy;  Laterality: N/A;   Family History  Problem Relation Age of Onset  . Diabetes Mother   . Hyperlipidemia Mother   . Hypertension Mother   . Heart disease Mother     before age 19  . Hyperlipidemia Father   . Hypertension Father   . Heart disease Father   . Hyperlipidemia Sister   . Hypertension Sister   . Diabetes Brother   . Hypertension Brother   . Hyperlipidemia Brother   . Seizures Brother    History  Substance Use Topics  . Smoking status: Never Smoker   . Smokeless tobacco: Never Used  . Alcohol Use: No    Review of Systems  Constitutional: Positive for fatigue. Negative for fever and chills.  HENT: Negative for congestion.   Eyes: Negative for visual disturbance.  Respiratory: Positive for shortness of breath. Negative for cough.   Cardiovascular: Positive for leg swelling. Negative  for chest pain.  Gastrointestinal: Positive for abdominal distention. Negative for vomiting and abdominal pain.  Genitourinary: Negative for dysuria and flank pain.  Musculoskeletal: Positive for arthralgias. Negative for back pain, neck pain and neck stiffness.  Skin: Negative for rash.  Neurological: Negative for light-headedness and headaches.      Allergies  Fentanyl and Propofol  Home Medications   Current Outpatient Rx  Name  Route  Sig  Dispense  Refill  . aspirin 81 MG chewable tablet   Oral   Chew 81 mg by mouth every morning.          Marland Kitchen atorvastatin (LIPITOR) 80 MG tablet   Oral   Take 80 mg by mouth every morning.         . Calcium Carbonate-Vit  D-Min (CALCIUM 1200) 1200-1000 MG-UNIT CHEW   Oral   Chew 1 tablet by mouth daily.         Marland Kitchen diltiazem (CARDIZEM CD) 120 MG 24 hr capsule   Oral   Take 120 mg by mouth every morning.         . insulin glargine (LANTUS) 100 UNIT/ML injection   Subcutaneous   Inject 24 Units into the skin at bedtime.          . insulin lispro (HUMALOG) 100 UNIT/ML injection   Subcutaneous   Inject 6 Units into the skin 3 (three) times daily as needed (for sugars over 280).          . isosorbide mononitrate (IMDUR) 30 MG 24 hr tablet   Oral   Take 30 mg by mouth every morning.         Marland Kitchen levothyroxine (SYNTHROID, LEVOTHROID) 150 MCG tablet   Oral   Take 150 mcg by mouth daily before breakfast.          . methocarbamol (ROBAXIN) 500 MG tablet   Oral   Take 500 mg by mouth 2 (two) times daily.         . metoprolol (LOPRESSOR) 50 MG tablet   Oral   Take 100 mg by mouth 2 (two) times daily.         . mycophenolate (MYFORTIC) 180 MG EC tablet   Oral   Take 720 mg by mouth 2 (two) times daily.          . Oxycodone HCl 10 MG TABS   Oral   Take 10 mg by mouth every 3 (three) hours as needed for severe pain (give with one percocet).         . pantoprazole (PROTONIX) 40 MG tablet   Oral   Take 40 mg by mouth daily.         . sirolimus (RAPAMUNE) 1 MG tablet   Oral   Take 1 mg by mouth every morning.         . sodium bicarbonate 650 MG tablet   Oral   Take 650 mg by mouth 2 (two) times daily.         . Vitamin D, Ergocalciferol, (DRISDOL) 50000 UNITS CAPS capsule   Oral   Take 50,000 Units by mouth every Thursday.          BP 147/67  Pulse 89  Temp(Src) 97.9 F (36.6 C) (Oral)  Resp 21  SpO2 100% Physical Exam  Nursing note and vitals reviewed. Constitutional: He is oriented to person, place, and time. He appears well-developed and well-nourished.  HENT:  Head: Normocephalic and atraumatic.  Eyes: Right eye exhibits no discharge. Left  eye exhibits no  discharge.  Neck: Normal range of motion. Neck supple. No tracheal deviation present.  Cardiovascular: Normal rate and regular rhythm.   Pulmonary/Chest: Effort normal and breath sounds normal.  Abdominal: Soft. He exhibits distension (mild, no focal tenderness to kidney transplant, scar seen RLQ). There is no tenderness. There is no guarding.  Musculoskeletal: He exhibits edema (bilateral 1+ LE). He exhibits no tenderness.  Neurological: He is alert and oriented to person, place, and time.  Skin: Skin is warm. No rash noted.  Psychiatric: He has a normal mood and affect.    ED Course  Procedures (including critical care time) Labs Review Labs Reviewed  COMPREHENSIVE METABOLIC PANEL - Abnormal; Notable for the following:    Chloride 93 (*)    Glucose, Bld 132 (*)    BUN 45 (*)    Creatinine, Ser 2.24 (*)    Calcium 6.1 (*)    Albumin 1.8 (*)    AST 38 (*)    Alkaline Phosphatase 163 (*)    GFR calc non Af Amer 30 (*)    GFR calc Af Amer 35 (*)    All other components within normal limits  CBC WITH DIFFERENTIAL - Abnormal; Notable for the following:    WBC 13.3 (*)    RBC 3.08 (*)    Hemoglobin 7.1 (*)    HCT 22.9 (*)    MCV 74.4 (*)    MCH 23.1 (*)    RDW 21.1 (*)    Neutrophils Relative % 81 (*)    Neutro Abs 10.8 (*)    All other components within normal limits  PRO B NATRIURETIC PEPTIDE - Abnormal; Notable for the following:    Pro B Natriuretic peptide (BNP) 29444.0 (*)    All other components within normal limits  TROPONIN I  CALCIUM, IONIZED  TYPE AND SCREEN   Imaging Review Dg Chest 2 View  04/21/2013   CLINICAL DATA:  Leg swelling, history CHF, hypertension, diabetes, stroke  EXAM: CHEST  2 VIEW  COMPARISON:  04/19/2013  FINDINGS: Enlargement of cardiac silhouette with pulmonary vascular congestion.  Diffuse pulmonary infiltrates compatible pulmonary edema and CHF.  Scattered areas of bibasilar atelectasis.  More focal opacity or left lung base may represent  atelectasis or increased edema but coexistent infection not excluded.  Small bibasilar effusions.  No pneumothorax.  Vascular stents in left upper arm.  Retained contrast in colon.  IMPRESSION: Diffuse pulmonary infiltrates likely representing pulmonary edema/ CHF with scattered areas of atelectasis and small bilateral pleural effusions as above.   Electronically Signed   By: Lavonia Dana M.D.   On: 04/21/2013 14:40    EKG Interpretation    Date/Time:  Friday April 21 2013 13:53:00 EST Ventricular Rate:  87 PR Interval:  221 QRS Duration: 87 QT Interval:  412 QTC Calculation: 496 R Axis:   63 Text Interpretation:  Sinus rhythm Borderline prolonged PR interval Probable left ventricular hypertrophy Borderline T abnormalities, inferior leads Borderline prolonged QT interval Confirmed by Mercy Leppla  MD, Aidyn Kellis ([id number]) on 04/21/2013 4:10:24 PM            MDM   Final diagnoses:  Acute CHF (congestive heart failure)  End stage renal disease- s/p transplant  Hypocalcemia  Anemia  CRF (chronic renal failure)   Concern for kidney vs cardiac for cause of swelling. No cp in ED.  Mild sob, on home O2 2 L [place] in ed, no distress. Complex medical hx.  Labs, cardiac eval, cxr.  CXR reviewed, acute CHF. Lasix given. HypoCa - Ca IV given. Anemia worsened, likely chronic dz, hemocult pending- type and screen.  The patients results and plan were reviewed and discussed.   Any x-rays performed were personally reviewed by myself.   Differential diagnosis were considered with the presenting HPI.  Spoke with admitting team.  Admission/ observation were discussed with the admitting physician, patient and/or family and they are comfortable with the plan.      Mariea Clonts, MD 04/21/13 [phone]

## 2013-04-21 NOTE — H&P (Signed)
Date: 04/21/2013               Patient Name:  Joshua [name] MRN: [id number]  DOB: March 26, 1950 Age / Sex: 63 y.o., male   PCP: Sueanne Margarita, MD         Medical Service: Internal Medicine Teaching Service         Attending Physician: Dr. Madilyn Fireman, MD    First Contact: Dr. Duwaine Maxin Pager: [phone]  Second Contact: Dr. Clayburn Pert Pager: [phone]       After Hours (After 5p/  First Contact Pager: [phone]  weekends / holidays): Second Contact Pager: [phone]   Chief Complaint: "swelling all over" and dyspnea  History of Present Illness: Joshua [name] is a 63 year old male with a PMH of HTN, DM type 2, CAD (s/p MI and LAD stent), CKD s/p kidney transplantation, carotid artery stenosis (s/p CEA 08/2011), dHF (EF 45-50% Jan 2015), PVD (s/p fem-pop in 1980), hx of papillary thyroid cancer and prostate cancer (with possible mets to spine)  who presents with complaint of edema and dyspnea.  He says has felt swollen all over for the past 3 days.  He is on 2L oxygen at home and reports increasing dyspnea since his recent hospitalization (2 weeks ago).  His wife says he sometimes needs to sleep sitting up.  He denies PND, CP, palpitations, leg pain or blood loss.  He reports compliance with medications.      Meds: Current Facility-Administered Medications  Medication Dose Route Frequency Provider Last Rate Last Dose  . [START ON 04/22/2013] atorvastatin (LIPITOR) tablet 80 mg  80 mg Oral q morning - 10a Otho Bellows, MD      . Derrill Memo ON 04/22/2013] diltiazem (CARDIZEM CD) 24 hr capsule 120 mg  120 mg Oral q morning - 10a Otho Bellows, MD      . diphenhydrAMINE (BENADRYL) capsule 25 mg  25 mg Oral Q6H PRN Otho Bellows, MD      . Derrill Memo ON 04/22/2013] insulin aspart (novoLOG) injection 0-9 Units  0-9 Units Subcutaneous TID WC Otho Bellows, MD      . insulin glargine (LANTUS) injection 15 Units  15 Units Subcutaneous QHS Otho Bellows, MD      . Derrill Memo ON 04/22/2013] isosorbide  mononitrate (IMDUR) 24 hr tablet 30 mg  30 mg Oral q morning - 10a Otho Bellows, MD      . Derrill Memo ON 04/22/2013] levothyroxine (SYNTHROID, LEVOTHROID) tablet 150 mcg  150 mcg Oral QAC breakfast Otho Bellows, MD      . metoprolol (LOPRESSOR) tablet 100 mg  100 mg Oral BID Otho Bellows, MD      . mycophenolate (MYFORTIC) EC tablet 720 mg  720 mg Oral BID Otho Bellows, MD      . oxyCODONE (Oxy IR/ROXICODONE) immediate release tablet 10 mg  10 mg Oral Q3H PRN Otho Bellows, MD      . pantoprazole (PROTONIX) EC tablet 40 mg  40 mg Oral Daily Otho Bellows, MD      . Derrill Memo ON 04/22/2013] sirolimus (RAPAMUNE) tablet 1 mg  1 mg Oral q morning - 10a Otho Bellows, MD      . sodium chloride 0.9 % injection 3 mL  3 mL Intravenous Q12H Otho Bellows, MD        Allergies: Allergies as of 04/21/2013 - Review Complete 04/21/2013  Allergen Reaction Noted  . Fentanyl  08/11/2011  .  Propofol  08/11/2011   Past Medical History  Diagnosis Date  . Carotid artery occlusion   . ASCVD (arteriosclerotic cardiovascular disease)   . CHF (congestive heart failure)   . Anginal pain     last chest pain was a couple of months ago-just lasted less than a minute  . Stroke     "light stroke" about time of MI  . Renal failure     s/p renal transplant  . Aortic stenosis     moderate AS by 06/2011 echo (Dr. Fransico Him)  . Coronary artery disease 03/2009    2 vessel s/p BMS to LAD in setting of NSTEMI, RCA chronically occluded  . PVD (peripheral vascular disease)     s/p fempop 1980's  . Hypothyroidism     s/p papillary thyroid CA excision  . Hypertension   . Hyperlipidemia   . Diastolic dysfunction   . Type II diabetes mellitus   . Prostate cancer     "radiation tx ~ 4-5 yr ago; cleared then came back a couple months ago" (03/20/2013)  . Plavix resistance     a. P2y12 365 in 03/2013.   Past Surgical History  Procedure Laterality Date  . Nephrectomy transplanted organ Bilateral 2001  . Pr  vein bypass graft,aorto-fem-pop  1980    fem pop   . Endarterectomy  08/25/2011    Procedure: ENDARTERECTOMY CAROTID;  Surgeon: Angelia Mould, MD;  Location: [place];  Service: Vascular;  Laterality: Right;  . Thyroidectomy    . Coronary angioplasty with stent placement  03/2009    "1"  . Colonoscopy N/A 03/31/2013    Procedure: COLONOSCOPY;  Surgeon: Lafayette Dragon, MD;  Location: [facility] ENDOSCOPY;  Service: Endoscopy;  Laterality: N/A;  . Esophagogastroduodenoscopy N/A 03/31/2013    Procedure: ESOPHAGOGASTRODUODENOSCOPY (EGD);  Surgeon: Lafayette Dragon, MD;  Location: [facility] ENDOSCOPY;  Service: Endoscopy;  Laterality: N/A;   Family History  Problem Relation Age of Onset  . Diabetes Mother   . Hyperlipidemia Mother   . Hypertension Mother   . Heart disease Mother     before age 28  . Hyperlipidemia Father   . Hypertension Father   . Heart disease Father   . Hyperlipidemia Sister   . Hypertension Sister   . Diabetes Brother   . Hypertension Brother   . Hyperlipidemia Brother   . Seizures Brother    History   Social History  . Marital Status: Married    Spouse Name: N/A    Number of Children: N/A  . Years of Education: N/A   Occupational History  . Not on file.   Social History Main Topics  . Smoking status: Never Smoker   . Smokeless tobacco: Never Used  . Alcohol Use: No  . Drug Use: No  . Sexual Activity: Yes   Other Topics Concern  . Not on file   Social History Narrative  . No narrative on file    Review of Systems: Pertinent items noted in HPI. General:  + generalized weakness Cardiac:  Denies CP, palpitations Pulmonary:  + dyspnea GI:  Denies N/V or diarrhea GU:  Decreased urine   Physical Exam: Blood pressure 149/77, pulse 101, temperature 98.3 F (36.8 C), temperature source Oral, resp. rate 22, SpO2 100.00%. General: resting in bed in NAD HEENT: PERRL, EOMI Cardiac: RRR, no rubs, murmurs or gallops Pulm: clear to auscultation bilaterally, moving  normal volumes of air Abd: soft, nontender, nondistended, BS present Ext: warm and well perfused, 1+ B/L  lower extremity edema Neuro: alert and oriented X3, cranial nerves II-XII grossly intact, 5/5 MMS   Lab results: Basic Metabolic Panel:  Recent Labs  04/19/13 0025 04/21/13 1330  NA 140 138  K 4.2 4.5  CL 92* 93*  CO2 25 24  GLUCOSE 82 132*  BUN 44* 45*  CREATININE 2.48* 2.24*  CALCIUM 6.3* 6.1*   Liver Function Tests:  Recent Labs  04/19/13 0025 04/21/13 1330  AST 42* 38*  ALT 16 16  ALKPHOS 191* 163*  BILITOT 0.4 0.4  PROT 7.4 7.0  ALBUMIN 2.0* 1.8*   CBC:  Recent Labs  04/19/13 0025 04/21/13 1330  WBC 13.3* 13.3*  NEUTROABS 10.7* 10.8*  HGB 8.0* 7.1*  HCT 26.1* 22.9*  MCV 74.6* 74.4*  PLT 370 364   Cardiac Enzymes:  Recent Labs  04/21/13 1330  TROPONINI <0.30   BNP:  Recent Labs  04/21/13 1330  PROBNP 29444.0*   Imaging results:  Dg Chest 2 View  04/21/2013   CLINICAL DATA:  Leg swelling, history CHF, hypertension, diabetes, stroke  EXAM: CHEST  2 VIEW  COMPARISON:  04/19/2013  FINDINGS: Enlargement of cardiac silhouette with pulmonary vascular congestion.  Diffuse pulmonary infiltrates compatible pulmonary edema and CHF.  Scattered areas of bibasilar atelectasis.  More focal opacity or left lung base may represent atelectasis or increased edema but coexistent infection not excluded.  Small bibasilar effusions.  No pneumothorax.  Vascular stents in left upper arm.  Retained contrast in colon.  IMPRESSION: Diffuse pulmonary infiltrates likely representing pulmonary edema/ CHF with scattered areas of atelectasis and small bilateral pleural effusions as above.   Electronically Signed   By: Lavonia Dana M.D.   On: 04/21/2013 14:40    Other results: EKG: 87 bpm, LVH, borderline prolonged QT  Assessment & Plan by Problem: 63 year old male with a PMH of HTN, DM type 2, CAD (s/p MI and LAD stent), kidney transplantation, carotid artery stenosis (s/p  CEA 08/2011), PVD (s/p fem-pop in 1980), hx of papillary thyroid cancer and prostate cancer who presents with complaint of edema x 3 days and dyspnea for several weeks.   Volume overload 2/2 diastolic dysfunction vs CKD:  Worsening dyspnea and lower extremity edema, proBNP [phone] and pulmonary edema on CXR.  ECHO 45-50% (Jan 2015), He is not on diuretic therapy at home.  Creatinine 2.24, near baseline 1.9-2.5.  He has had some recent significant increases (up to 4.02 last admission) and reports decreased urination but is unsure for how long. - admit to IMTS on telemetry - supplemental oxygen   - Lasix 20mg  IV x 2 - 2D ECHO - daily weights and I&Os - consult nephrology in the AM  CKD 3/4 (13 years s/p renal transplant):  Baseline ~ 1.9-2.5.  Creatine 2.24 today.     - Continue sirolimus - Consult to nephrology in the AM.  Acute on chronic anemia:  Unclear etiology.  Hgb 7.1 on admission.  - Transfused 1 unit PRBCs - post-transfusion CBC  Hypocalcemia:  6.3 on admission.  No albumin checked.  He received calcium gluconate in the ED. - AM CMP - ionized calcium  DM type 2:  On 24 units at home but wife reports several hypoglycemic episodes so she has been given 12 units for the past few days. - Lantus 15 units qHS and SSI - monitor CBG  HTN:  Stable.  Continue home medications:  Cardizem, Lopressor  Diet:  Heart healthy  VTE ppx:  SCDs  Dispo: Disposition is  deferred at this time, awaiting improvement of current medical problems. Anticipated discharge in approximately 1-2 day(s).   The patient does have a current PCP (Sueanne Margarita, MD) and does need an [facility] hospital follow-up appointment after discharge.  The patient does not know have transportation limitations that hinder transportation to clinic appointments.  Signed: Duwaine Maxin, DO 04/21/2013, 6:17 PM

## 2013-04-21 NOTE — ED Notes (Signed)
Pt HR 140's with movement, then back down to 80. MD aware. Pt unaware of HR- asymptomatic. Pt's wife states it does this frequently.

## 2013-04-21 NOTE — ED Notes (Signed)
To ED for eval of generalized swelling for past 24 hrs. Denies SOB. Pt lives with wife and daughter. Told by PMD to come to ED for further eval

## 2013-04-21 NOTE — ED Notes (Addendum)
CRITICAL VALUE ALERT  Critical value received:  Ca 6.1  Date of notification:  04/21/2013   Time of notification:  5830  Critical value read back:yes  Nurse who received alert:  Leodis Rains  MD notified (1st page):  Dr. Yaakov Guthrie  Time of first page:    MD notified (2nd page):  Time of second page:  Responding MD:    Time MD responded:  1434

## 2013-04-22 ENCOUNTER — Inpatient Hospital Stay (HOSPITAL_COMMUNITY): Payer: Medicare Other

## 2013-04-22 ENCOUNTER — Encounter (HOSPITAL_COMMUNITY): Payer: Self-pay | Admitting: Nephrology

## 2013-04-22 DIAGNOSIS — I359 Nonrheumatic aortic valve disorder, unspecified: Secondary | ICD-10-CM

## 2013-04-22 DIAGNOSIS — E8779 Other fluid overload: Secondary | ICD-10-CM

## 2013-04-22 DIAGNOSIS — N184 Chronic kidney disease, stage 4 (severe): Secondary | ICD-10-CM

## 2013-04-22 DIAGNOSIS — I129 Hypertensive chronic kidney disease with stage 1 through stage 4 chronic kidney disease, or unspecified chronic kidney disease: Secondary | ICD-10-CM

## 2013-04-22 DIAGNOSIS — D649 Anemia, unspecified: Secondary | ICD-10-CM

## 2013-04-22 DIAGNOSIS — E119 Type 2 diabetes mellitus without complications: Secondary | ICD-10-CM

## 2013-04-22 LAB — CBC
HCT: 25.6 % — ABNORMAL LOW (ref 39.0–52.0)
HEMATOCRIT: 23.8 % — AB (ref 39.0–52.0)
HEMOGLOBIN: 8.1 g/dL — AB (ref 13.0–17.0)
Hemoglobin: 7.4 g/dL — ABNORMAL LOW (ref 13.0–17.0)
MCH: 23.1 pg — AB (ref 26.0–34.0)
MCH: 23.4 pg — AB (ref 26.0–34.0)
MCHC: 31.1 g/dL (ref 30.0–36.0)
MCHC: 31.6 g/dL (ref 30.0–36.0)
MCV: 74.4 fL — AB (ref 78.0–100.0)
MCV: 74 fL — AB (ref 78.0–100.0)
Platelets: 333 10*3/uL (ref 150–400)
Platelets: 261 10*3/uL (ref 150–400)
RBC: 3.2 MIL/uL — AB (ref 4.22–5.81)
RBC: 3.46 MIL/uL — ABNORMAL LOW (ref 4.22–5.81)
RDW: 20.7 % — ABNORMAL HIGH (ref 11.5–15.5)
RDW: 20.7 % — ABNORMAL HIGH (ref 11.5–15.5)
WBC: 12.6 10*3/uL — AB (ref 4.0–10.5)
WBC: 11.1 10*3/uL — ABNORMAL HIGH (ref 4.0–10.5)

## 2013-04-22 LAB — COMPREHENSIVE METABOLIC PANEL
ALT: 16 U/L (ref 0–53)
AST: 38 U/L — AB (ref 0–37)
Albumin: 1.6 g/dL — ABNORMAL LOW (ref 3.5–5.2)
Alkaline Phosphatase: 175 U/L — ABNORMAL HIGH (ref 39–117)
BUN: 47 mg/dL — ABNORMAL HIGH (ref 6–23)
CO2: 22 meq/L (ref 19–32)
Calcium: 6 mg/dL — CL (ref 8.4–10.5)
Chloride: 93 mEq/L — ABNORMAL LOW (ref 96–112)
Creatinine, Ser: 2.27 mg/dL — ABNORMAL HIGH (ref 0.50–1.35)
GFR calc Af Amer: 34 mL/min — ABNORMAL LOW (ref >90)
GFR, EST NON AFRICAN AMERICAN: 29 mL/min — AB (ref >90)
GLUCOSE: 179 mg/dL — AB (ref 70–99)
POTASSIUM: 4.5 meq/L (ref 3.7–5.3)
SODIUM: 139 meq/L (ref 137–147)
TOTAL PROTEIN: 6.5 g/dL (ref 6.0–8.3)
Total Bilirubin: 0.4 mg/dL (ref 0.3–1.2)

## 2013-04-22 LAB — BASIC METABOLIC PANEL
BUN: 48 mg/dL — AB (ref 6–23)
CHLORIDE: 92 meq/L — AB (ref 96–112)
CO2: 22 mEq/L (ref 19–32)
Calcium: 6.1 mg/dL — CL (ref 8.4–10.5)
Creatinine, Ser: 2.32 mg/dL — ABNORMAL HIGH (ref 0.50–1.35)
GFR calc non Af Amer: 29 mL/min — ABNORMAL LOW (ref >90)
GFR, EST AFRICAN AMERICAN: 33 mL/min — AB (ref >90)
GLUCOSE: 153 mg/dL — AB (ref 70–99)
POTASSIUM: 4.4 meq/L (ref 3.7–5.3)
Sodium: 138 mEq/L (ref 137–147)

## 2013-04-22 LAB — GLUCOSE, CAPILLARY
Glucose-Capillary: 150 mg/dL — ABNORMAL HIGH (ref 70–99)
Glucose-Capillary: 156 mg/dL — ABNORMAL HIGH (ref 70–99)
Glucose-Capillary: 164 mg/dL — ABNORMAL HIGH (ref 70–99)
Glucose-Capillary: 184 mg/dL — ABNORMAL HIGH (ref 70–99)

## 2013-04-22 LAB — TYPE AND SCREEN
ABO/RH(D): O POS
Antibody Screen: NEGATIVE
Unit division: 0

## 2013-04-22 LAB — SODIUM, URINE, RANDOM: SODIUM UR: 26 meq/L

## 2013-04-22 MED ORDER — FUROSEMIDE 10 MG/ML IJ SOLN: 40.0000 mg | Freq: Two times a day (BID) | INTRAMUSCULAR | Status: DC

## 2013-04-22 MED ORDER — GLUCERNA SHAKE PO LIQD
237.0000 mL | Freq: Two times a day (BID) | ORAL | Status: DC
  Administered 2013-04-22 – 2013-05-14 (×25): 237 mL via ORAL
  Filled 2013-04-22 (×2): qty 237

## 2013-04-22 MED ORDER — FUROSEMIDE 10 MG/ML IJ SOLN
40.0000 mg | Freq: Two times a day (BID) | INTRAMUSCULAR | Status: DC
  Administered 2013-04-23 – 2013-04-25 (×5): 40 mg via INTRAVENOUS
  Filled 2013-04-22 (×7): qty 4

## 2013-04-22 MED ORDER — FUROSEMIDE 10 MG/ML IJ SOLN
40.0000 mg | Freq: Once | INTRAMUSCULAR | Status: AC
  Administered 2013-04-22: 40 mg via INTRAVENOUS
  Filled 2013-04-22: qty 4

## 2013-04-22 NOTE — H&P (Signed)
INTERNAL MEDICINE TEACHING ATTENDING NOTE  Day 1 of stay  Patient name: Joshua [name]  MRN: [id number] Date of birth: [birth year]   63 y.o. male with complain of SOB and Anasarca.  He has hypertension, DM2, CAD(LAD stent), CKD s/p kidney transplant, history of papillary thyroid cancer and prostate cancer, carotid artery stenosis s/p endarterectomy on 08/15/2011, documented peripheral vascular disease (Fempop bypass 1980), mets to back s/p radiation therapy.  He and his wife Jorge Mandril (who is the main caregiver), note that he started swelling up (face, abdomen and legs) and having more SOB than usual about 2-3 weeks ago. He is on 2L of oxygen at home which he wears while moving around and sleeping, however has felt dyspnic at rest as well. He admits orthopnea but denies PND, leg pain. He has cough for the past 1 week, with chills. He denies any change in urination or bowel movement. He denies chest pain or palpitations. He also has Dm and his wife notes hypoglycemic attacks in the past with his insulin dose of 24 units of lantus. He also complains of twitching of his arms since the past 2 weeks. He denies any seizure like activity.   He  has a past medical history of Carotid artery occlusion; ASCVD (arteriosclerotic cardiovascular disease); CHF (congestive heart failure); Anginal pain; Stroke; Renal failure; Aortic stenosis; Coronary artery disease (03/2009); PVD (peripheral vascular disease); Hypothyroidism; Hypertension; Hyperlipidemia; Diastolic dysfunction; Type II diabetes mellitus; Prostate cancer; and Plavix resistance.  Relevant home meds:  Aspirin 81 Atorvastatin 80 daily Calcium-vitamin D Cardizem 120 SR daily Lantus 24 units daily Humalog 6 units TId with meals Imdur 30 Sr Synthroid 150 mcg daily Metoprolol 50 BID Mycophenolate 720 mg BID Sirolimus 1 mg daily Oxycodone Protonix 40 daily Sodibicarb 650 daily  Filed Vitals:   04/22/13 0815 04/22/13 0930 04/22/13 1004 04/22/13 1111  BP:  136/81 137/64 137/62   Pulse:  83 83   Temp:  98.3 F (36.8 C)    TempSrc:  Oral    Resp:  18    Height:      Weight:      SpO2:  97%  98%   Exam:  PERRLA, EOMI Alert and oriented times 3 Jerky movement in left arm visualized. Chvostek sign negative.  Tongue - bluish areas seen as if bitten. On oxygen, does not seem to be in distress Chest - non-tender.  Heart - RRR, no murmurs. Lungs - wheezing Abdomen - soft, distended, thrill positive, BS positive Left arm intact fistula, right arm obliterated fistula.  No scrotal edema. Condom Cath. Feet bilateral 1+ swelling pulses present. No wounds.  Neurologically intact.    Recent Labs Lab 04/19/13 0025 04/21/13 1330 04/22/13 0156 04/22/13 0849  NA 140 138 139 138  K 4.2 4.5 4.5 4.4  CL 92* 93* 93* 92*  CO2 25 24 22 22   GLUCOSE 82 132* 179* 153*  BUN 44* 45* 47* 48*  CREATININE 2.48* 2.24* 2.27* 2.32*  CALCIUM 6.3* 6.1* 6.0* 6.1*     Recent Labs Lab 04/19/13 0025 04/21/13 1330 04/22/13 0156  AST 42* 38* 38*  ALT 16 16 16   ALKPHOS 191* 163* 175*  BILITOT 0.4 0.4 0.4  PROT 7.4 7.0 6.5  ALBUMIN 2.0* 1.8* 1.6*     Recent Labs Lab 04/21/13 1330 04/22/13 0145 04/22/13 0849  HGB 7.1* 7.4* 8.1*  HCT 22.9* 23.8* 25.6*  WBC 13.3* 12.6* 11.1*  PLT 364 333 261     Recent Labs Lab 04/21/13 1330  TROPONINI <0.30  Recent Labs Lab 04/21/13 1330  PROBNP 29444.0*    Intake/Output Summary (Last 24 hours) at 04/22/13 1426 Last data filed at 04/22/13 1018  Gross per 24 hour  Intake  915.5 ml  Output    400 ml  Net  515.5 ml    CXR - Diffuse pulmonary infiltrates likely representing pulmonary edema/  CHF with scattered areas of atelectasis and small bilateral pleural  effusions as above.   Marland Kitchen atorvastatin  80 mg Oral q1800  . diltiazem  120 mg Oral q morning - 10a  . insulin aspart  0-9 Units Subcutaneous TID WC  . insulin glargine  15 Units Subcutaneous QHS  . isosorbide mononitrate  30 mg Oral q  morning - 10a  . levothyroxine  150 mcg Oral QAC breakfast  . metoprolol  100 mg Oral BID  . mycophenolate  720 mg Oral BID  . pantoprazole  40 mg Oral Daily  . sirolimus  1 mg Oral q morning - 10a  . sodium chloride  3 mL Intravenous Q12H     Assessment and Plan Shortness of Breath - Likely due to volume overload in the setting of worsening CKD, and perhaps a side effect of sirolimus (last admission sirolimus dose was decreased - he had suffered sirolimus toxicity due to dilt interaction). Also the last ECHO (03/21/13) shows ejection fraction of 45-50%.  For now, we will continue to diurese lasix 40 IV bid,  and strict IOs. Agree with nephrology recs to get sirolimus level. Thanks for the consult, Dr. Jonnie Finner.  Cardiology consult to obtain.  Twitching and jerky movements, no seizures - secondary to hypoalbunemia, or uremia? Ca 6.1, though corrected calcium is 8. However, I would still get ionized calcium done. Continue calcium supplementation.   Agree with sensitive insulin scale for him in view of hypoglycemia.  Recent Labs Lab 04/19/13 0338 04/19/13 0603 04/21/13 2117 04/22/13 0612 04/22/13 1126  GLUCAP 108* 168* 172* 150* 156*    I have seen and evaluated this patient and discussed it with my IM resident team.  Please see the rest of the plan per resident note from today.   [place], [place] 04/22/2013, 12:12 PM.

## 2013-04-22 NOTE — Progress Notes (Addendum)
CRITICAL VALUE ALERT  Critical value received:  Calcium 6.0  Date of notification:  04/22/13 Time of notification: 7741  Critical value read back:yes  Nurse who received alert:  Sandrea Hughs  MD notified (1st page): internal med t/s  Time of first page:  0245  MD notified (2nd page):  Time of second page:  Responding MD: internal med t/s  Time MD responded:  [phone]

## 2013-04-22 NOTE — Progress Notes (Signed)
Patient received 1unit PRBC earlier in shift, no reaction noted. Patient also had c/o itching earlier in the shift prior to blood administration. PRN Benadryl administered as ordered, no further complaints of itching throughout the night.  Patient also had c/o intermittent SOB, MD notified and received PRN order for Albuterol neb, will administer as ordered if needed and continue to monitor.  Patient heart rate occasionally sustained in the 140's throughout the night.  MD notified and received order for PRN IV Metoprolol. Will administer if/as needed and continue to monitor.  Patient currently resting comfortably, will continue to monitor. Tresa Endo

## 2013-04-22 NOTE — Progress Notes (Signed)
Around 0800 this morning patient HR up and sustain in 140, also pt. C/o shortness of breath , assesses pt., Patient have bil. Expiratory wehezees. MD notified, no new order given, MD said will see pt. On rounding. 2.5mg  metoprolol given per prn order for high HR.will continue to monitor.

## 2013-04-22 NOTE — Progress Notes (Signed)
  Echocardiogram 2D Echocardiogram has been performed.  Donata Clay 04/22/2013, 2:50 PM

## 2013-04-22 NOTE — Progress Notes (Signed)
Patient heart rate sustaining in the 130's. Patient is asymptomatic. PRN Metoprolol administered as ordered, will continue to monitor. Tresa Endo

## 2013-04-22 NOTE — Consult Note (Signed)
Renal Service Consult Note [facility]  Joshua [name] 04/22/2013 Joshua [name] D Requesting Physician:  Joshua [name]  Reason for Consult:  ESRD patient with renal Tx and CKD presenting with SOB, pulm edema, vol overload HPI: The patient is a 63 y.o. year-old with hx of ESRD, on HD from 1981 until renal Tx in 2002 at [facility]. Baseline creat was 1.8 but since last hospital stay has been up at 2.2-2.4.  He was admitted here (see below) last month with chest pain, back pain, bilateral PNA, atrial tachycardia, acute on CKD, sirolimus toxicity from drug interaction, LGIB and known prostate Ca with suspected mets to spine.  He is admitted now with SOB and LE edema. Last echo EF was 45%.  Creat is 2.3, it was 4.02 at time of d/c on Jan 28th. He denies fever, CP, proc cough.  No voiding complaints. Initial CXR showed bilat significant pulm edema on 2/11.  Given IV lasix and f/u cxr 2/13 showed improving edema but still abnormal film.  SOB has improved. Minimal UOP recorded since admission. Wt unchanged.    Chart review 03/20/13-04/05/13 > chest pain, back pain, hx of prostate w recent mets to spine. Known CAD hx of stent. +Myoview w peri-infarct ischemia, cath avoided due to CKD/transplant.  Treated medically. Had atrial tach treated with diltiazem, but this caused sirolimus toxicity due to drug interaction. MTP didn't work so dilt added back and sirolimus dose decreased 3 > 2mg  daily. HR was still up on dilt so Multaq was tried but this medication also interacts with sirolimus so it was stopped. MTP added back and HR eventually controlled w MTP and dilt.  Pt had PNA w perihilar [name] infiltrates on chest CT and got IV abx with improvement. He had some diarrhea and BRBPR, got transfused. EGD and limited colonoscopy were done. Barium enema showed no mass or source of bleeding. Plavix and ASA held at d/c w plans to restart later. Pt noted to have likely mets to lumbar and thoracic spine per MRI done a  few wks before admit, although bone scan in Dec 2014 was noted to be negative. PSA was 18, though urology thought PSA should be much higher if these truly were prostate Ca mets. Pt had had XRT 4-5 yrs ago and sees Joshua [name] for this issue. D/c'd with oxycodone for chronic back pain.   ROS  no cp, fever  no jt pain  very weak, says he is too weak to walk  Past Medical History  Past Medical History  Diagnosis Date  . Carotid artery occlusion   . ASCVD (arteriosclerotic cardiovascular disease)   . CHF (congestive heart failure)   . Anginal pain     last chest pain was a couple of months ago-just lasted less than a minute  . Stroke     "light stroke" about time of MI  . Renal failure     s/p renal transplant  . Aortic stenosis     moderate AS by 06/2011 echo (Joshua. Fransico [name])  . Coronary artery disease 03/2009    2 vessel s/p BMS to LAD in setting of NSTEMI, RCA chronically occluded  . PVD (peripheral vascular disease)     s/p fempop 1980's  . Hypothyroidism     s/p papillary thyroid CA excision  . Hypertension   . Hyperlipidemia   . Diastolic dysfunction   . Type II diabetes mellitus   . Prostate cancer     "radiation tx ~ 4-5 yr ago; cleared then  came back a couple months ago" (03/20/2013)  . Plavix resistance     a. P2y12 365 in 03/2013.   Past Surgical History  Past Surgical History  Procedure Laterality Date  . Nephrectomy transplanted organ Bilateral 2001  . Pr vein bypass graft,aorto-fem-pop  1980    fem pop   . Endarterectomy  08/25/2011    Procedure: ENDARTERECTOMY CAROTID;  Surgeon: Joshua Mould, MD;  Location: [place];  Service: Vascular;  Laterality: Right;  . Thyroidectomy    . Coronary angioplasty with stent placement  03/2009    "1"  . Colonoscopy N/A 03/31/2013    Procedure: COLONOSCOPY;  Surgeon: Joshua Dragon, MD;  Location: [facility] ENDOSCOPY;  Service: Endoscopy;  Laterality: N/A;  . Esophagogastroduodenoscopy N/A 03/31/2013    Procedure:  ESOPHAGOGASTRODUODENOSCOPY (EGD);  Surgeon: Joshua Dragon, MD;  Location: [facility] ENDOSCOPY;  Service: Endoscopy;  Laterality: N/A;   Family History  Family History  Problem Relation Age of Onset  . Diabetes Mother   . Hyperlipidemia Mother   . Hypertension Mother   . Heart disease Mother     before age 60  . Hyperlipidemia Father   . Hypertension Father   . Heart disease Father   . Hyperlipidemia Sister   . Hypertension Sister   . Diabetes Brother   . Hypertension Brother   . Hyperlipidemia Brother   . Seizures Brother    Social History  reports that he has never smoked. He has never used smokeless tobacco. He reports that he does not drink alcohol or use illicit drugs. Allergies  Allergies  Allergen Reactions  . Fentanyl     "Almost died"   . Propofol     "Almost died"    Home medications Prior to Admission medications   Medication Sig Start Date End Date Taking? Authorizing Provider  aspirin 81 MG chewable tablet Chew 81 mg by mouth every morning.    Yes Historical Provider, MD  atorvastatin (LIPITOR) 80 MG tablet Take 80 mg by mouth every morning.   Yes Historical Provider, MD  Calcium Carbonate-Vit D-Min (CALCIUM 1200) 1200-1000 MG-UNIT CHEW Chew 1 tablet by mouth daily.   Yes Historical Provider, MD  diltiazem (CARDIZEM CD) 120 MG 24 hr capsule Take 120 mg by mouth every morning. 04/05/13  Yes Joshua Eaton, MD  insulin glargine (LANTUS) 100 UNIT/ML injection Inject 24 Units into the skin at bedtime.    Yes Historical Provider, MD  insulin lispro (HUMALOG) 100 UNIT/ML injection Inject 6 Units into the skin 3 (three) times daily as needed (for sugars over 280).    Yes Historical Provider, MD  isosorbide mononitrate (IMDUR) 30 MG 24 hr tablet Take 30 mg by mouth every morning. 04/05/13  Yes Joshua Eaton, MD  levothyroxine (SYNTHROID, LEVOTHROID) 150 MCG tablet Take 150 mcg by mouth daily before breakfast.    Yes Historical Provider, MD  methocarbamol (ROBAXIN) 500 MG  tablet Take 500 mg by mouth 2 (two) times daily. 02/09/13  Yes Joshua Fischer, MD  metoprolol (LOPRESSOR) 50 MG tablet Take 100 mg by mouth 2 (two) times daily. 04/05/13  Yes Joshua Eaton, MD  mycophenolate (MYFORTIC) 180 MG EC tablet Take 720 mg by mouth 2 (two) times daily.    Yes Historical Provider, MD  Oxycodone HCl 10 MG TABS Take 10 mg by mouth every 3 (three) hours as needed for severe pain (give with one percocet). 04/05/13  Yes Joshua Eaton, MD  pantoprazole (PROTONIX) 40 MG tablet Take 40 mg by mouth daily.  Yes Historical Provider, MD  sirolimus (RAPAMUNE) 1 MG tablet Take 1 mg by mouth every morning. 04/05/13  Yes Jayce G Cook, DO  sodium bicarbonate 650 MG tablet Take 650 mg by mouth 2 (two) times daily. 04/05/13  Yes Joshua Eaton, MD  Vitamin D, Ergocalciferol, (DRISDOL) 50000 UNITS CAPS capsule Take 50,000 Units by mouth every Thursday.   Yes Historical Provider, MD   Liver Function Tests  Recent Labs Lab 04/19/13 0025 04/21/13 1330 04/22/13 0156  AST 42* 38* 38*  ALT 16 16 16   ALKPHOS 191* 163* 175*  BILITOT 0.4 0.4 0.4  PROT 7.4 7.0 6.5  ALBUMIN 2.0* 1.8* 1.6*   No results found for this basename: LIPASE, AMYLASE,  in the last 168 hours CBC  Recent Labs Lab 04/19/13 0025 04/21/13 1330 04/22/13 0145 04/22/13 0849  WBC 13.3* 13.3* 12.6* 11.1*  NEUTROABS 10.7* 10.8*  --   --   HGB 8.0* 7.1* 7.4* 8.1*  HCT 26.1* 22.9* 23.8* 25.6*  MCV 74.6* 74.4* 74.4* 74.0*  PLT 370 364 333 0000000   Basic Metabolic Panel  Recent Labs Lab 04/19/13 0025 04/21/13 1330 04/22/13 0156 04/22/13 0849  NA 140 138 139 138  K 4.2 4.5 4.5 4.4  CL 92* 93* 93* 92*  CO2 25 24 22 22   GLUCOSE 82 132* 179* 153*  BUN 44* 45* 47* 48*  CREATININE 2.48* 2.24* 2.27* 2.32*  CALCIUM 6.3* 6.1* 6.0* 6.1*    Exam  Blood pressure 137/62, pulse 83, temperature 98.3 F (36.8 C), temperature source Oral, resp. rate 18, height 5\' 11"  (1.803 m), weight 85.2 kg (187 lb 13.3 oz), SpO2  98.00%. Frail older adult male, up in chair, very weak No rash, cyanosis or gangrene Sclera anicteric, throat clear No jvd R base decreased BS, occ crackles bilat bases RRR soft SEM no rub or gallop Abd distended, +ascites, liver down 5cm, +BS GU condom cath in place Ext bilat pretib edema 1-2+, no ulcers or gangrene Neuro tremulous, mild asterixis, oriented with minimal correction   Echo Jan 2015 - EF 45-50% UA 2/11 100 prot, 0-2wbc, 3-6rbc CXR 2/11 bilat marked pulm edema, CXR 2/13 some improvement bilat pulm edema, still wet    Assessment: 1 Renal transplant w CKD 3/4- baseline creatinine was 1.8 the last few years, but since recent admission in mid Jan has not been below 2.2.  Creat 2.32 now, pt presenting with SOB and pulm edema.  CXR improving but still wet as of yesterday. Has some LE edema on exam, question ascites as well.  Check sirolimus level, there were problems last admit w toxicity and dose was decreased 3 > 2 mg per day. Now he is only on 1mg /day.  Continue Myfortic. Continue to diurese as you are doing.  Needs I/O's to be properly documented.  Cont  2 Pulm edema- diuresing 3 Systolic HF EF [id number] 4 Prostate cancer with recent question of mets to spine on MRI (no MRI in chart here ) 5 Hx CVA 6 Hx CAD w stent; had +Myoview for ischemia last admit, no cath due to CKD 7 Atrial tachycardia- last admit, on MTP and diltiazem for rate control   Plan- Sirolimus level, urine Na, cont diuresing still wet  Kelly Splinter MD (pgr) [phone]    ([phone] 04/22/2013, 12:44 PM

## 2013-04-22 NOTE — Progress Notes (Addendum)
INITIAL NUTRITION ASSESSMENT  DOCUMENTATION CODES Per approved criteria  -Severe malnutrition in the context of chronic illness   INTERVENTION: Add Glucerna Shake po BID, each supplement provides 220 kcal and 10 grams of protein. RD to continue to follow nutrition care plan.  NUTRITION DIAGNOSIS: Inadequate oral intake related to chronic illness as evidenced by weight loss and decreased intake.  Goal: Patient to meet >/= 90% of estimated nutrition needs.  Monitor:  Weight, labs, I/Os, diet advancement and PO intake  Reason for Assessment: Malnutrition Screening Tool  63 y.o. male  Admitting Dx: Acute CHF (congestive heart failure)  ASSESSMENT: Patient with PMH of HTN, DM-II, CAD, hx of paillary thyroid cancer, kidney transplantation, carotid artery stenosis, PVD, and prostate cancer. Recently hospitalized 2 weeks ago. Patient presents edema and dyspnea x 3 days.  Patient was followed by RD staff during acute hospitalization. Pt was found to have severe malnutrition 2/2 weight loss, poor intake, and severe muscle mass loss (temples, clavicles and acromion.) Patient continues to meet criteria for severe malnutrition in the context of chronic illness as evidenced by severe muscle mass loss and estimated energy intake of < 75% for greater than 1 month.  Per chart, pt has not had a BM x 1 week.  Currently eating 10% of Heart Healthy diet. Pt is currently off unit, pt's lunch tray remains at bedside. Pt ate all of applesauce, most of chicken breast that was on sandwich, and that is it.  Height: Ht Readings from Last 1 Encounters:  04/21/13 5\' 11"  (1.803 m)    Weight: Wt Readings from Last 1 Encounters:  04/22/13 187 lb 13.3 oz (85.2 kg)  suspect artificially high 2/2 fluid status  Ideal Body Weight: 172 lbs  % Ideal Body Weight: 109%  Wt Readings from Last 10 Encounters:  04/22/13 187 lb 13.3 oz (85.2 kg)  04/13/13 192 lb 9.6 oz (87.363 kg)  04/05/13 182 lb 15.7 oz (83  kg)  04/05/13 182 lb 15.7 oz (83 kg)  03/11/13 190 lb (86.183 kg)  01/11/13 190 lb (86.183 kg)  10/26/12 190 lb (86.183 kg)  03/23/12 197 lb (89.359 kg)  09/16/11 204 lb (92.534 kg)  08/25/11 202 lb 2.6 oz (91.7 kg)    Usual Body Weight: 190 lbs  % Usual Body Weight: 98%  BMI:  Body mass index is 26.21 kg/(m^2). Overweight; suspect artificially high 2/2 fluid status  Estimated Nutritional Needs: Kcal: 2000-2300 Protein: 100-120 grams Fluid: 1.5 L  Skin: stage II wound to sacrum  Diet Order: Cardiac  EDUCATION NEEDS: -No education needs identified at this time   Intake/Output Summary (Last 24 hours) at 04/22/13 1244 Last data filed at 04/22/13 1018  Gross per 24 hour  Intake  915.5 ml  Output    400 ml  Net  515.5 ml    Last BM: 2/7  Labs:   Recent Labs Lab 04/21/13 1330 04/22/13 0156 04/22/13 0849  NA 138 139 138  K 4.5 4.5 4.4  CL 93* 93* 92*  CO2 24 22 22   BUN 45* 47* 48*  CREATININE 2.24* 2.27* 2.32*  CALCIUM 6.1* 6.0* 6.1*  GLUCOSE 132* 179* 153*    CBG (last 3)   Recent Labs  04/21/13 2117 04/22/13 0612 04/22/13 1126  GLUCAP 172* 150* 156*   Lab Results  Component Value Date   HGBA1C 7.8* 03/20/2013     Scheduled Meds: . atorvastatin  80 mg Oral q1800  . diltiazem  120 mg Oral q morning - 10a  .  insulin aspart  0-9 Units Subcutaneous TID WC  . insulin glargine  15 Units Subcutaneous QHS  . isosorbide mononitrate  30 mg Oral q morning - 10a  . levothyroxine  150 mcg Oral QAC breakfast  . metoprolol  100 mg Oral BID  . mycophenolate  720 mg Oral BID  . pantoprazole  40 mg Oral Daily  . sirolimus  1 mg Oral q morning - 10a  . sodium chloride  3 mL Intravenous Q12H    Continuous Infusions:    Past Medical History  Diagnosis Date  . Carotid artery occlusion   . ASCVD (arteriosclerotic cardiovascular disease)   . CHF (congestive heart failure)   . Anginal pain     last chest pain was a couple of months ago-just lasted less  than a minute  . Stroke     "light stroke" about time of MI  . Renal failure     s/p renal transplant  . Aortic stenosis     moderate AS by 06/2011 echo (Dr. Fransico Him)  . Coronary artery disease 03/2009    2 vessel s/p BMS to LAD in setting of NSTEMI, RCA chronically occluded  . PVD (peripheral vascular disease)     s/p fempop 1980's  . Hypothyroidism     s/p papillary thyroid CA excision  . Hypertension   . Hyperlipidemia   . Diastolic dysfunction   . Type II diabetes mellitus   . Prostate cancer     "radiation tx ~ 4-5 yr ago; cleared then came back a couple months ago" (03/20/2013)  . Plavix resistance     a. P2y12 365 in 03/2013.    Past Surgical History  Procedure Laterality Date  . Nephrectomy transplanted organ Bilateral 2001  . Pr vein bypass graft,aorto-fem-pop  1980    fem pop   . Endarterectomy  08/25/2011    Procedure: ENDARTERECTOMY CAROTID;  Surgeon: Angelia Mould, MD;  Location: [place];  Service: Vascular;  Laterality: Right;  . Thyroidectomy    . Coronary angioplasty with stent placement  03/2009    "1"  . Colonoscopy N/A 03/31/2013    Procedure: COLONOSCOPY;  Surgeon: Lafayette Dragon, MD;  Location: [facility] ENDOSCOPY;  Service: Endoscopy;  Laterality: N/A;  . Esophagogastroduodenoscopy N/A 03/31/2013    Procedure: ESOPHAGOGASTRODUODENOSCOPY (EGD);  Surgeon: Lafayette Dragon, MD;  Location: [facility] ENDOSCOPY;  Service: Endoscopy;  Laterality: N/A;   Inda Coke MS, RD, LDN Inpatient Registered Dietitian Pager: [phone] After-hours pager: [phone]

## 2013-04-22 NOTE — Progress Notes (Signed)
Subjective: Joshua [name] was seen and examined this morning.  He says his breathing has improved and he feels less edematous.   Objective: Vital signs in last 24 hours: Filed Vitals:   04/21/13 2146 04/21/13 2239 04/21/13 2330 04/22/13 0451  BP: 136/73 129/75 129/74 145/70  Pulse: 147 130 125 89  Temp: 98.8 F (37.1 C) 98.5 F (36.9 C) 98.6 F (37 C) 98.2 F (36.8 C)  TempSrc: Oral Oral Oral Oral  Resp: 20 18 16 18   Height:      Weight:    85.2 kg (187 lb 13.3 oz)  SpO2: 100% 98% 98% 100%   Weight change:   Intake/Output Summary (Last 24 hours) at 04/22/13 1003 Last data filed at 04/22/13 0615  Gross per 24 hour  Intake  792.5 ml  Output      0 ml  Net  792.5 ml   General: resting in bed in NAD HEENT: PERRL, EOMI, no scleral icterus Cardiac: RRR, no rubs, murmurs or gallops, no JVD Pulm: wheezing B/L, no crackles appreciated Abd: soft, nondistended, BS present, mild diffuse tenderness GU:  No scrotal edema, cloudy yellow urine in foley bag (condom cath in place) Ext: warm and well perfused, trace lower extremity edema (improved from yesterday) Neuro: alert and oriented X3  Lab Results: Basic Metabolic Panel:  Recent Labs Lab 04/22/13 0156 04/22/13 0849  NA 139 138  K 4.5 4.4  CL 93* 92*  CO2 22 22  GLUCOSE 179* 153*  BUN 47* 48*  CREATININE 2.27* 2.32*  CALCIUM 6.0* 6.1*   Liver Function Tests:  Recent Labs Lab 04/21/13 1330 04/22/13 0156  AST 38* 38*  ALT 16 16  ALKPHOS 163* 175*  BILITOT 0.4 0.4  PROT 7.0 6.5  ALBUMIN 1.8* 1.6*   CBC:  Recent Labs Lab 04/19/13 0025 04/21/13 1330 04/22/13 0145 04/22/13 0849  WBC 13.3* 13.3* 12.6* 11.1*  NEUTROABS 10.7* 10.8*  --   --   HGB 8.0* 7.1* 7.4* 8.1*  HCT 26.1* 22.9* 23.8* 25.6*  MCV 74.6* 74.4* 74.4* 74.0*  PLT 370 364 333 261   Cardiac Enzymes:  Recent Labs Lab 04/21/13 1330  TROPONINI <0.30   BNP:  Recent Labs Lab 04/21/13 1330  PROBNP 29444.0*   CBG:  Recent Labs Lab  04/18/13 2030 04/19/13 0338 04/19/13 0603 04/21/13 2117 04/22/13 0612  GLUCAP 141* 108* 168* 172* 150*   Studies/Results: Dg Chest 2 View  04/21/2013   CLINICAL DATA:  Leg swelling, history CHF, hypertension, diabetes, stroke  EXAM: CHEST  2 VIEW  COMPARISON:  04/19/2013  FINDINGS: Enlargement of cardiac silhouette with pulmonary vascular congestion.  Diffuse pulmonary infiltrates compatible pulmonary edema and CHF.  Scattered areas of bibasilar atelectasis.  More focal opacity or left lung base may represent atelectasis or increased edema but coexistent infection not excluded.  Small bibasilar effusions.  No pneumothorax.  Vascular stents in left upper arm.  Retained contrast in colon.  IMPRESSION: Diffuse pulmonary infiltrates likely representing pulmonary edema/ CHF with scattered areas of atelectasis and small bilateral pleural effusions as above.   Electronically Signed   By: Lavonia Dana M.D.   On: 04/21/2013 14:40   Medications: I have reviewed the patient's current medications. Scheduled Meds: . atorvastatin  80 mg Oral q1800  . diltiazem  120 mg Oral q morning - 10a  . insulin aspart  0-9 Units Subcutaneous TID WC  . insulin glargine  15 Units Subcutaneous QHS  . isosorbide mononitrate  30 mg Oral q morning -  10a  . levothyroxine  150 mcg Oral QAC breakfast  . metoprolol  100 mg Oral BID  . mycophenolate  720 mg Oral BID  . pantoprazole  40 mg Oral Daily  . sirolimus  1 mg Oral q morning - 10a  . sodium chloride  3 mL Intravenous Q12H   Continuous Infusions:  PRN Meds:.albuterol, diphenhydrAMINE, oxyCODONE  Assessment/Plan: 63 year old male with a PMH of HTN, DM type 2, CAD (s/p MI and LAD stent), kidney transplantation, carotid artery stenosis (s/p CEA 08/2011), PVD (s/p fem-pop in 1980), dHF (EF 45-50% Jan 2015), hx of papillary thyroid cancer and prostate cancer who presents with complaint of edema x 3 days and dyspnea for several weeks.   Volume overload 2/2 diastolic  dysfunction vs CKD: Worsening dyspnea and lower extremity edema, proBNP [phone] and pulmonary edema on CXR. ECHO 45-50% (Jan 2015), He is not on diuretic therapy at home. Creatinine 2.24, near baseline 1.9-2.5. He has had some recent significant increases (up to 4.02 last admission) and reports decreased urination but is unsure for how long.  Improved symptoms with diuresis.  Nephrology consulted and recommendations appreciated. 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 Creatine 2.24 today.  Nephrology following. - Continue sirolimus  - UA - urine looks cloudy  Acute on chronic anemia: Hgb 7.1 --> 8.1 this AM s/p 1 unit PRBCs - monitor for bleeding - monitor CBC  Hypocalcemia: 6.3 on admission and he received calcium gluconate.  Repeat this AM 6.1 but albumin 1.6 and corrected Ca is 8.0 so this is 2/2 to hypoalbuminemia.  - ionized calcium   DM type 2: On 24 units at home but wife reports several hypoglycemic episodes so she has been given 12 units for the past few days.  Currently stable. - continue Lantus 15 units qHS and SSI  - monitor CBG   HTN: Stable. Continue home medications: Cardizem, Lopressor   Diet: Heart healthy   VTE ppx: SCDs  Dispo: Disposition is deferred at this time, awaiting improvement of current medical problems.  Anticipated discharge in approximately 1-2 day(s).   The patient does have a current PCP (Sueanne Margarita, MD) and does need an [facility] hospital follow-up appointment after discharge.  The patient does not know have transportation limitations that hinder transportation to clinic appointments.  .Services Needed at time of discharge: Y = Yes, Blank = No PT:   OT:   RN:   Equipment:   Other:     LOS: 1 day   Duwaine Maxin, DO 04/22/2013, 10:03 AM

## 2013-04-23 LAB — CBC WITH DIFFERENTIAL/PLATELET
Basophils Absolute: 0 10*3/uL (ref 0.0–0.1)
Basophils Relative: 0 % (ref 0–1)
EOS ABS: 0.1 10*3/uL (ref 0.0–0.7)
Eosinophils Relative: 1 % (ref 0–5)
HCT: 24.5 % — ABNORMAL LOW (ref 39.0–52.0)
Hemoglobin: 7.6 g/dL — ABNORMAL LOW (ref 13.0–17.0)
LYMPHS ABS: 1.7 10*3/uL (ref 0.7–4.0)
LYMPHS PCT: 17 % (ref 12–46)
MCH: 23 pg — ABNORMAL LOW (ref 26.0–34.0)
MCHC: 31 g/dL (ref 30.0–36.0)
MCV: 74.2 fL — ABNORMAL LOW (ref 78.0–100.0)
Monocytes Absolute: 0.4 10*3/uL (ref 0.1–1.0)
Monocytes Relative: 4 % (ref 3–12)
NEUTROS PCT: 78 % — AB (ref 43–77)
Neutro Abs: 8 10*3/uL — ABNORMAL HIGH (ref 1.7–7.7)
PLATELETS: 302 10*3/uL (ref 150–400)
RBC: 3.3 MIL/uL — AB (ref 4.22–5.81)
RDW: 20.7 % — ABNORMAL HIGH (ref 11.5–15.5)
WBC: 10.2 10*3/uL (ref 4.0–10.5)

## 2013-04-23 LAB — CBC
HEMATOCRIT: 26.7 % — AB (ref 39.0–52.0)
Hemoglobin: 8.5 g/dL — ABNORMAL LOW (ref 13.0–17.0)
MCH: 23.7 pg — ABNORMAL LOW (ref 26.0–34.0)
MCHC: 31.8 g/dL (ref 30.0–36.0)
MCV: 74.6 fL — ABNORMAL LOW (ref 78.0–100.0)
PLATELETS: 275 10*3/uL (ref 150–400)
RBC: 3.58 MIL/uL — ABNORMAL LOW (ref 4.22–5.81)
RDW: 20.8 % — ABNORMAL HIGH (ref 11.5–15.5)
WBC: 10.3 10*3/uL (ref 4.0–10.5)

## 2013-04-23 LAB — GLUCOSE, CAPILLARY
GLUCOSE-CAPILLARY: 158 mg/dL — AB (ref 70–99)
Glucose-Capillary: 121 mg/dL — ABNORMAL HIGH (ref 70–99)
Glucose-Capillary: 152 mg/dL — ABNORMAL HIGH (ref 70–99)
Glucose-Capillary: 173 mg/dL — ABNORMAL HIGH (ref 70–99)

## 2013-04-23 LAB — CALCIUM, IONIZED: Calcium, Ion: 0.71 mmol/L — ABNORMAL LOW (ref 1.13–1.30)

## 2013-04-23 LAB — BASIC METABOLIC PANEL
BUN: 52 mg/dL — ABNORMAL HIGH (ref 6–23)
CO2: 23 meq/L (ref 19–32)
Calcium: 6 mg/dL — CL (ref 8.4–10.5)
Chloride: 94 mEq/L — ABNORMAL LOW (ref 96–112)
Creatinine, Ser: 2.38 mg/dL — ABNORMAL HIGH (ref 0.50–1.35)
GFR calc Af Amer: 32 mL/min — ABNORMAL LOW (ref >90)
GFR, EST NON AFRICAN AMERICAN: 28 mL/min — AB (ref >90)
GLUCOSE: 141 mg/dL — AB (ref 70–99)
POTASSIUM: 3.7 meq/L (ref 3.7–5.3)
Sodium: 141 mEq/L (ref 137–147)

## 2013-04-23 LAB — CREATININE, URINE, RANDOM: Creatinine, Urine: 56.84 mg/dL

## 2013-04-23 LAB — SODIUM, URINE, RANDOM: Sodium, Ur: 58 mEq/L

## 2013-04-23 MED ORDER — SIROLIMUS 1 MG PO TABS
1.0000 mg | ORAL_TABLET | Freq: Once | ORAL | Status: AC
  Administered 2013-04-23: 1 mg via ORAL
  Filled 2013-04-23: qty 1

## 2013-04-23 MED ORDER — SODIUM CHLORIDE 0.9 % IV SOLN
2.0000 g | Freq: Two times a day (BID) | INTRAVENOUS | Status: AC
  Administered 2013-04-23 (×2): 2 g via INTRAVENOUS
  Filled 2013-04-23 (×2): qty 20

## 2013-04-23 MED ORDER — SIROLIMUS 1 MG PO TABS
2.0000 mg | ORAL_TABLET | Freq: Every morning | ORAL | Status: DC
  Administered 2013-04-24 – 2013-04-29 (×6): 2 mg via ORAL
  Filled 2013-04-23 (×7): qty 2

## 2013-04-23 MED ORDER — OXYCODONE HCL 5 MG PO TABS
10.0000 mg | ORAL_TABLET | ORAL | Status: DC | PRN
  Administered 2013-04-24 – 2013-05-10 (×45): 10 mg via ORAL
  Filled 2013-04-23 (×51): qty 2

## 2013-04-23 NOTE — Progress Notes (Signed)
Subjective: No overnight events. Lasix was continued. New TTE with worsening HF, decrease systolic fx, diffuse hypokinesis, and EF 30-35% as compared to last ECHO from 03/20/13. Today he endorses improved breathing and decreased edema. He feels that his abdomen is less distended.   Objective: Vital signs in last 24 hours: Filed Vitals:   04/23/13 0247 04/23/13 0417 04/23/13 0935 04/23/13 1300  BP:  131/60 136/65 146/70  Pulse: 79 79 83 73  Temp:  98.7 F (37.1 C) 98.2 F (36.8 C) 97.9 F (36.6 C)  TempSrc:  Oral Oral Oral  Resp: 18 18 18 18   Height:      Weight:  188 lb 15 oz (85.7 kg)    SpO2: 94% 96% 100% 97%   Weight change: 7.1 oz (0.2 kg)  Intake/Output Summary (Last 24 hours) at 04/23/13 1631 Last data filed at 04/23/13 1300  Gross per 24 hour  Intake    480 ml  Output   2050 ml  Net  -1570 ml   General: Lying in bed, NAD HEENT: PERRL, EOMI, no scleral icterus Cardiac: RRR, no rubs, murmurs or gallops, no JVD appreciated  Pulm: CTAB, no crackles or rales appreciated Abd: soft, distended, BS present GU:  condom cath in place Ext: warm and well perfused, lower extremity edema resolved Neuro: alert and oriented X3, nonfocal  Lab Results: Basic Metabolic Panel:  Recent Labs Lab 04/22/13 0849 04/23/13 0400  NA 138 141  K 4.4 3.7  CL 92* 94*  CO2 22 23  GLUCOSE 153* 141*  BUN 48* 52*  CREATININE 2.32* 2.38*  CALCIUM 6.1* 6.0*   Liver Function Tests:  Recent Labs Lab 04/21/13 1330 04/22/13 0156  AST 38* 38*  ALT 16 16  ALKPHOS 163* 175*  BILITOT 0.4 0.4  PROT 7.0 6.5  ALBUMIN 1.8* 1.6*   CBC:  Recent Labs Lab 04/21/13 1330  04/22/13 0849 04/23/13 0400  WBC 13.3*  < > 11.1* 10.2  NEUTROABS 10.8*  --   --  8.0*  HGB 7.1*  < > 8.1* 7.6*  HCT 22.9*  < > 25.6* 24.5*  MCV 74.4*  < > 74.0* 74.2*  PLT 364  < > 261 302  < > = values in this interval not displayed. Cardiac Enzymes:  Recent Labs Lab 04/21/13 1330  TROPONINI <0.30    BNP:  Recent Labs Lab 04/21/13 1330  PROBNP 29444.0*   CBG:  Recent Labs Lab 04/22/13 0612 04/22/13 1126 04/22/13 1713 04/22/13 2132 04/23/13 0551 04/23/13 1108  GLUCAP 150* 156* 164* 184* 121* 158*   Studies/Results: Dg Chest 2 View  04/22/2013   CLINICAL DATA:  Shortness of breath and weakness  EXAM: CHEST  2 VIEW  COMPARISON:  04/21/2013  FINDINGS: Clips project over the neck from presumed thyroidectomy. Interstitial edema, trace effusions, and curvilinear bilateral lower lobe presumed atelectasis are again noted. There has been interval increase in hazy predominantly dependent bilateral lower lobe airspace opacity. Cardiomegaly persists. Left axillary clips are noted.  IMPRESSION: Slight increase in bibasilar opacities likely progressive alveolar superimposed on interstitial pulmonary edema.   Electronically Signed   By: Conchita Paris M.D.   On: 04/22/2013 16:32   Medications: I have reviewed the patient's current medications. Scheduled Meds: . atorvastatin  80 mg Oral q1800  . calcium gluconate  2 g Intravenous BID  . diltiazem  120 mg Oral q morning - 10a  . feeding supplement (GLUCERNA SHAKE)  237 mL Oral BID BM  . furosemide  40 mg  Intravenous Q12H  . insulin aspart  0-9 Units Subcutaneous TID WC  . insulin glargine  15 Units Subcutaneous QHS  . isosorbide mononitrate  30 mg Oral q morning - 10a  . levothyroxine  150 mcg Oral QAC breakfast  . metoprolol  100 mg Oral BID  . mycophenolate  720 mg Oral BID  . pantoprazole  40 mg Oral Daily  . sirolimus  1 mg Oral Once  . [START ON 04/24/2013] sirolimus  2 mg Oral q morning - 10a  . sodium chloride  3 mL Intravenous Q12H   Continuous Infusions:  PRN Meds:.albuterol, diphenhydrAMINE, oxyCODONE  Assessment/Plan: 63 year old male with a PMH of HTN, DM type 2, CAD (s/p MI and LAD stent), kidney transplantation, carotid artery stenosis (s/p CEA 08/2011), PVD (s/p fem-pop in 1980), dHF (EF 45-50% Jan 2015), hx of  papillary thyroid cancer and prostate cancer who presents with complaint of edema x 3 days and dyspnea for several weeks.   Volume overload: Likely 2/2 worsening CHF in the setting of CKD about 13ys s/p renal transplant, on sirolimus and mycophenolate. Pt w/ worsening dyspnea and lower extremity edema, proBNP [phone] and pulmonary edema on CXR on admission. ECHO from 03/2013 w/ EF 09-38% and mild systolic dysfunction. Creatinine 2.24, near baseline 1.9-2.5. He reports decreased urination recently, and was not on diuretics at home. On admission, IV Lasix weas started adn a repeat TTE was performed which showed worsening of his CHF, now with and EF of 18-29%, severe systolic dysfunction, and diffuse hypokinesis. Nephrology consulted and agrees with continued diuresis. Also checking sirolimus levels, as there was concern for toxicity at his last admission in January. - Continue IV Lasix 40mg  BID for the 2 more doses and transition to po - Daily weights and I&Os  - F/u with Nephrology, appreciate recommendations  CKD stage 3-4: Pt ~13 years s/p renal transplant. Baseline Cr 1.9-2.5. Creatine 2.24 on admisison.  Nephrology following. Continuing Sirolimus and Myfortic. Serum Sirolimus level pending. - Continue anti-rejection meds - F/u Sirolimus level  Acute on chronic anemia: Hgb 7.1 on admission and tx 1u pRBCs, responded to 8.1. Hgb 7.6 this morning. No active bleeding.  - q12 CBC  Hypocalcemia: 6.3 on admission and he received calcium gluconate.  Repeat on 12/15 was 6.1 w/ albumin 1.6. Corrected Ca was 8.0. However ionized Calcium was low at 0.71. Replacing Calcium.   DM type 2: Stable. Previously on 24 units Lantus at home, but wife reported hypoglycemic episodes, so she was administering 12 units over the past few days.  Pt started on Lantus 15u on admission w/ SSI. - continue Lantus 15 units qHS and SSI   HTN: Stable. Continued home medications on admission: Cardizem, Lopressor   H/o prostate  cancer: Question of spinal mets at last hospitalization, reportedly seen on MRI, but negative bone scan 02/2013. Pt seen by Dr. Diona Fanti with Urology, and per his last hospital d/c, wanted the pt to be worked up as an outpatient. He has not required pain medication while in the hospital.  - Continue Oxycodone IR 10mg  q 4h PRN  DVT ppx: SCDs   Dispo: Disposition is deferred at this time, awaiting improvement of current medical problems.  Anticipated discharge in approximately 1-2 day(s).   The patient does have a current PCP (Sueanne Margarita, MD) and may need an [facility] hospital follow-up appointment after discharge.  The patient does not know have transportation limitations that hinder transportation to clinic appointments.  .Services Needed at time of  discharge: Y = Yes, Blank = No PT:   OT:   RN:   Equipment:   Other:     LOS: 2 days   Otho Bellows, MD 04/23/2013, 4:31 PM

## 2013-04-23 NOTE — Progress Notes (Signed)
  [facility] Progress Note   Subjective: Feeling better today, breathing better  Filed Vitals:   04/22/13 2350 04/23/13 0247 04/23/13 0417 04/23/13 0935  BP: 127/75  131/60 136/65  Pulse: 77 79 79 83  Temp:   98.7 F (37.1 C) 98.2 F (36.8 C)  TempSrc:   Oral Oral  Resp: 18 18 18 18   Height:      Weight:   85.7 kg (188 lb 15 oz)   SpO2:  94% 96% 100%  Exam Looks much better, stronger, able to sit up on side of bed No jvd Rales resolved, clear bilat today  RRR soft SEM no rub or gallop  Abd distended, +ascites, liver down 4cm, +BS GU condom cath in place  Leg edema resolved Neuro tremors better, Ox3, nonfocal  UA - 100 protein, 3-6 rbc, 0-2 wbc CXR 2/14 bilat pulm edema  Assessment: 1 Renal transplant / CKD 3/4- stable creat, cont rapamune (home dose changed up to 2mg /day confirmed with wife) and Myfortic 2 Pulm edema / vol excess- improving clinicaly, looks much better 3 Syst HF EF 45% 4 Prostate cancer, question of spinal mets last admission 5 Hx CAD w stent- +myoview last admit but no cath due to CKD 6 Atrial tach- last admit, Rx w po MTP and diltiazem 7 Hypocalcemia- due to CKD, getting IV Ca  Plan- cont diuresis with IV lasix another 24 hrs then transition to po    Kelly Splinter MD pager [phone]    cell [phone] 04/23/2013, 3:22 PM   Recent Labs Lab 04/22/13 0156 04/22/13 0849 04/23/13 0400  NA 139 138 141  K 4.5 4.4 3.7  CL 93* 92* 94*  CO2 22 22 23   GLUCOSE 179* 153* 141*  BUN 47* 48* 52*  CREATININE 2.27* 2.32* 2.38*  CALCIUM 6.0* 6.1* 6.0*    Recent Labs Lab 04/19/13 0025 04/21/13 1330 04/22/13 0156  AST 42* 38* 38*  ALT 16 16 16   ALKPHOS 191* 163* 175*  BILITOT 0.4 0.4 0.4  PROT 7.4 7.0 6.5  ALBUMIN 2.0* 1.8* 1.6*    Recent Labs Lab 04/19/13 0025 04/21/13 1330 04/22/13 0145 04/22/13 0849 04/23/13 0400  WBC 13.3* 13.3* 12.6* 11.1* 10.2  NEUTROABS 10.7* 10.8*  --   --  8.0*  HGB 8.0* 7.1* 7.4* 8.1* 7.6*   HCT 26.1* 22.9* 23.8* 25.6* 24.5*  MCV 74.6* 74.4* 74.4* 74.0* 74.2*  PLT 370 364 333 261 302   . atorvastatin  80 mg Oral q1800  . calcium gluconate  2 g Intravenous BID  . diltiazem  120 mg Oral q morning - 10a  . feeding supplement (GLUCERNA SHAKE)  237 mL Oral BID BM  . furosemide  40 mg Intravenous Q12H  . insulin aspart  0-9 Units Subcutaneous TID WC  . insulin glargine  15 Units Subcutaneous QHS  . isosorbide mononitrate  30 mg Oral q morning - 10a  . levothyroxine  150 mcg Oral QAC breakfast  . metoprolol  100 mg Oral BID  . mycophenolate  720 mg Oral BID  . pantoprazole  40 mg Oral Daily  . sirolimus  1 mg Oral q morning - 10a  . sodium chloride  3 mL Intravenous Q12H     albuterol, diphenhydrAMINE, oxyCODONE

## 2013-04-24 ENCOUNTER — Inpatient Hospital Stay (HOSPITAL_COMMUNITY): Payer: Medicare Other

## 2013-04-24 DIAGNOSIS — Z8546 Personal history of malignant neoplasm of prostate: Secondary | ICD-10-CM

## 2013-04-24 LAB — BASIC METABOLIC PANEL WITH GFR
BUN: 48 mg/dL — ABNORMAL HIGH (ref 6–23)
CO2: 24 meq/L (ref 19–32)
Calcium: 6.7 mg/dL — ABNORMAL LOW (ref 8.4–10.5)
Chloride: 93 meq/L — ABNORMAL LOW (ref 96–112)
Creatinine, Ser: 2.22 mg/dL — ABNORMAL HIGH (ref 0.50–1.35)
GFR calc Af Amer: 35 mL/min — ABNORMAL LOW
GFR calc non Af Amer: 30 mL/min — ABNORMAL LOW
Glucose, Bld: 138 mg/dL — ABNORMAL HIGH (ref 70–99)
Potassium: 3.7 meq/L (ref 3.7–5.3)
Sodium: 141 meq/L (ref 137–147)

## 2013-04-24 LAB — CBC
HCT: 25.7 % — ABNORMAL LOW (ref 39.0–52.0)
HCT: 23.6 % — ABNORMAL LOW (ref 39.0–52.0)
HEMOGLOBIN: 7.9 g/dL — AB (ref 13.0–17.0)
HEMOGLOBIN: 7.4 g/dL — AB (ref 13.0–17.0)
MCH: 23.3 pg — ABNORMAL LOW (ref 26.0–34.0)
MCH: 23.5 pg — AB (ref 26.0–34.0)
MCHC: 30.7 g/dL (ref 30.0–36.0)
MCHC: 31.4 g/dL (ref 30.0–36.0)
MCV: 75.8 fL — ABNORMAL LOW (ref 78.0–100.0)
MCV: 74.9 fL — ABNORMAL LOW (ref 78.0–100.0)
Platelets: 265 10*3/uL (ref 150–400)
Platelets: 289 10*3/uL (ref 150–400)
RBC: 3.39 MIL/uL — ABNORMAL LOW (ref 4.22–5.81)
RBC: 3.15 MIL/uL — AB (ref 4.22–5.81)
RDW: 21.5 % — ABNORMAL HIGH (ref 11.5–15.5)
RDW: 21.4 % — ABNORMAL HIGH (ref 11.5–15.5)
WBC: 11.7 10*3/uL — AB (ref 4.0–10.5)
WBC: 11.1 10*3/uL — ABNORMAL HIGH (ref 4.0–10.5)

## 2013-04-24 LAB — GLUCOSE, CAPILLARY
Glucose-Capillary: 131 mg/dL — ABNORMAL HIGH (ref 70–99)
Glucose-Capillary: 168 mg/dL — ABNORMAL HIGH (ref 70–99)
Glucose-Capillary: 186 mg/dL — ABNORMAL HIGH (ref 70–99)
Glucose-Capillary: 144 mg/dL — ABNORMAL HIGH (ref 70–99)

## 2013-04-24 LAB — UREA NITROGEN, URINE: UREA NITROGEN UR: 434 mg/dL

## 2013-04-24 MED ORDER — ALBUTEROL SULFATE (2.5 MG/3ML) 0.083% IN NEBU
2.5000 mg | INHALATION_SOLUTION | Freq: Once | RESPIRATORY_TRACT | Status: AC
  Administered 2013-04-24: 2.5 mg via RESPIRATORY_TRACT
  Filled 2013-04-24: qty 3

## 2013-04-24 NOTE — Progress Notes (Signed)
Pharmacist Heart Failure Core Measure Documentation  Assessment: Joshua [name] has an EF documented as 30-35% on 04/22/13 by ECHO.  Rationale: Heart failure patients with left ventricular systolic dysfunction (LVSD) and an EF < 40% should be prescribed an angiotensin converting enzyme inhibitor (ACEI) or angiotensin receptor blocker (ARB) at discharge unless a contraindication is documented in the medical record.  This patient is not currently on an ACEI or ARB for HF.  This note is being placed in the record in order to provide documentation that a contraindication to the use of these agents is present for this encounter.  ACE Inhibitor or Angiotensin Receptor Blocker is contraindicated (specify all that apply)  []   ACEI allergy AND ARB allergy []   Angioedema []   Moderate or severe aortic stenosis []   Hyperkalemia []   Hypotension []   Renal artery stenosis [x]   Worsening renal function, preexisting renal disease or dysfunction   [name] 04/24/2013 4:12 PM

## 2013-04-24 NOTE — Progress Notes (Signed)
Internal Medicine Attending  Date: 04/24/2013  Patient name: Daivik Overley Medical record number: [id number] Date of birth: Jan 14, 1951 Age: 63 y.o. Gender: male  I saw and evaluated the patient. I reviewed the resident's note by Dr. Redmond Pulling and I agree with the resident's findings and plans as documented in her progress note.  Mr. Ingle was admitted with volume overload secondary to worsening systolic cardiomyopathy and possibly also secondary to worsening renal function.  He has been diuresed with IV lasix but had an episode of acute dyspnea felt to be secondary to pulmonary edema last night.  This responded well symptomatically to an extra dose of lasix but suggests aggressive diuresis is still necessary.  With + myoview in the past and worsening LVEF, flash pulmonary edema secondary to ischemia remains a possibility.  Will increase Imdur to 60 mg daily and continue metoprolol at 100 mg twice daily.  We will have to closely follow the HR given the concomitant Cardizem (? for HTN and proteinuria).  ACEI currently contraindicated given acute renal disease.  If we can stabilize with diuresis w/o worsening renal function we can bring up the issue of further cardiac evaluation with the patient, Nephrology, and Cardiology given the risks associated with a dye study.

## 2013-04-24 NOTE — Progress Notes (Signed)
Patient ID: Joshua [name], male   DOB: 10-08-1950, 63 y.o.   MRN: [id number] S:feels better O:BP 118/52  Pulse 85  Temp(Src) 97.8 F (36.6 C) (Oral)  Resp 18  Ht 5\' 11"  (1.803 m)  Wt 83.3 kg (183 lb 10.3 oz)  BMI 25.62 kg/m2  SpO2 95%  Intake/Output Summary (Last 24 hours) at 04/24/13 1404 Last data filed at 04/24/13 0828  Gross per 24 hour  Intake    240 ml  Output   1625 ml  Net  -1385 ml   Intake/Output: I/O last 3 completed shifts: In: 480 [P.O.:480] Out: 3475 [Urine:3475]  Intake/Output this shift:  Total I/O In: 240 [P.O.:240] Out: -  Weight change: -2.4 kg (-5 lb 4.7 oz) Gen:WD AAM in NAd CVS:no  rub Resp:bibasilar crackles with occ exp wheezes Abd:+BS, soft, NT/NT Ext:+presacral edema, LUE AVF +T/B   Recent Labs Lab 04/19/13 0025 04/21/13 1330 04/22/13 0156 04/22/13 0849 04/23/13 0400  NA 140 138 139 138 141  K 4.2 4.5 4.5 4.4 3.7  CL 92* 93* 93* 92* 94*  CO2 25 24 22 22 23   GLUCOSE 82 132* 179* 153* 141*  BUN 44* 45* 47* 48* 52*  CREATININE 2.48* 2.24* 2.27* 2.32* 2.38*  ALBUMIN 2.0* 1.8* 1.6*  --   --   CALCIUM 6.3* 6.1* 6.0* 6.1* 6.0*  AST 42* 38* 38*  --   --   ALT 16 16 16   --   --    Liver Function Tests:  Recent Labs Lab 04/19/13 0025 04/21/13 1330 04/22/13 0156  AST 42* 38* 38*  ALT 16 16 16   ALKPHOS 191* 163* 175*  BILITOT 0.4 0.4 0.4  PROT 7.4 7.0 6.5  ALBUMIN 2.0* 1.8* 1.6*   No results found for this basename: LIPASE, AMYLASE,  in the last 168 hours No results found for this basename: AMMONIA,  in the last 168 hours CBC:  Recent Labs Lab 04/19/13 0025 04/21/13 1330 04/22/13 0145 04/22/13 0849 04/23/13 0400 04/23/13 1718 04/24/13 0527  WBC 13.3* 13.3* 12.6* 11.1* 10.2 10.3 11.7*  NEUTROABS 10.7* 10.8*  --   --  8.0*  --   --   HGB 8.0* 7.1* 7.4* 8.1* 7.6* 8.5* 7.9*  HCT 26.1* 22.9* 23.8* 25.6* 24.5* 26.7* 25.7*  MCV 74.6* 74.4* 74.4* 74.0* 74.2* 74.6* 75.8*  PLT 370 364 333 261 302 275 265   Cardiac  Enzymes:  Recent Labs Lab 04/21/13 1330  TROPONINI <0.30   CBG:  Recent Labs Lab 04/23/13 1108 04/23/13 1640 04/23/13 2200 04/24/13 0607 04/24/13 1113  GLUCAP 158* 152* 173* 131* 168*    Iron Studies: No results found for this basename: IRON, TIBC, TRANSFERRIN, FERRITIN,  in the last 72 hours Studies/Results: Dg Chest 2 View  04/24/2013   CLINICAL DATA:  Dyspnea  EXAM: CHEST  2 VIEW  COMPARISON:  Prior radiograph from 04/22/2013  FINDINGS: Cardiomegaly is stable as compared to the prior exam.  Lungs are mildly hypoinflated. There is diffuse pulmonary vascular congestion with indistinctness of the interstitial markings, compatible with pulmonary edema, worsened. Small bilateral pleural effusions are present. More confluent bilateral perihilar opacities likely reflect alveolar edema. No definite focal infiltrates identified. No pneumothorax.  Surgical clips overlie the left axilla and lower neck. No acute osseous abnormality.  IMPRESSION: Cardiomegaly with small bilateral pleural effusions and moderate diffuse pulmonary edema, worsened as compared to 04/22/13.   Electronically Signed   By: Jeannine Boga M.D.   On: 04/24/2013 03:12   . atorvastatin  80  mg Oral q1800  . diltiazem  120 mg Oral q morning - 10a  . feeding supplement (GLUCERNA SHAKE)  237 mL Oral BID BM  . furosemide  40 mg Intravenous Q12H  . insulin aspart  0-9 Units Subcutaneous TID WC  . insulin glargine  15 Units Subcutaneous QHS  . isosorbide mononitrate  30 mg Oral q morning - 10a  . levothyroxine  150 mcg Oral QAC breakfast  . metoprolol  100 mg Oral BID  . mycophenolate  720 mg Oral BID  . pantoprazole  40 mg Oral Daily  . sirolimus  2 mg Oral q morning - 10a  . sodium chloride  3 mL Intravenous Q12H    BMET    Component Value Date/Time   NA 141 04/23/2013 0400   K 3.7 04/23/2013 0400   CL 94* 04/23/2013 0400   CO2 23 04/23/2013 0400   GLUCOSE 141* 04/23/2013 0400   BUN 52* 04/23/2013 0400    CREATININE 2.38* 04/23/2013 0400   CALCIUM 6.0* 04/23/2013 0400   GFRNONAA 28* 04/23/2013 0400   GFRAA 32* 04/23/2013 0400   CBC    Component Value Date/Time   WBC 11.7* 04/24/2013 0527   RBC 3.39* 04/24/2013 0527   RBC 3.35* 03/27/2013 0930   HGB 7.9* 04/24/2013 0527   HCT 25.7* 04/24/2013 0527   PLT 265 04/24/2013 0527   MCV 75.8* 04/24/2013 0527   MCH 23.3* 04/24/2013 0527   MCHC 30.7 04/24/2013 0527   RDW 21.5* 04/24/2013 0527   LYMPHSABS 1.7 04/23/2013 0400   MONOABS 0.4 04/23/2013 0400   EOSABS 0.1 04/23/2013 0400   BASOSABS 0.0 04/23/2013 0400    Assessment/Plan:  1. 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  Scr stable, cont with rapamune/myfortic 2. pulm edema/volume excess- improving clinically and diuresing but unsure why he has had such [name] quick readmission.  Consider Cardiology re-eval 3. CHF systolic (EF 41%) 4. Prostate Ca - ?spinal mets and due to CT guided biopsy in March, maybe can be done as an inpt while he is here. 5. CAD s/p PTA with stent, + myoview but no cardiac cath due to CKD 6. Hypocalcemia- on IV Ca 7. Anemia- no on ESA and has h/o prostate CA. Transfuse prn. 8. Atrial tachycardia- on po MTP and dilt  Joshua [name]

## 2013-04-24 NOTE — Progress Notes (Signed)
Subjective: Overnight Joshua [name] reported dyspnea and CXR revealed worsened pulmonary edema compared to 02/14 CXR.  He received his AM dose of Lasix 40mg  IV early.  He was seen and examined this morning.  He says his breathing is better (currently on 2L) and feels less edematous.  Objective: Vital signs in last 24 hours: Filed Vitals:   04/23/13 1300 04/23/13 2036 04/24/13 0616 04/24/13 1100  BP: 146/70 135/58 135/55 125/60  Pulse: 73 94 81 87  Temp: 97.9 F (36.6 C) 99.2 F (37.3 C) 98 F (36.7 C)   TempSrc: Oral Oral Oral   Resp: 18 18 17    Height:      Weight:   83.3 kg (183 lb 10.3 oz)   SpO2: 97% 94% 98%    Weight change: -2.4 kg (-5 lb 4.7 oz)  Intake/Output Summary (Last 24 hours) at 04/24/13 1242 Last data filed at 04/24/13 0932  Gross per 24 hour  Intake    480 ml  Output   1925 ml  Net  -1445 ml   General: Lying in bed, NAD Cardiac: RRR, no rubs, murmurs or gallops Pulm: wheezing, no crackles appreciated Abd: soft, distended, BS present GU:  500 cc clear yellow urine in foley bag (has condom cath) Ext: warm and well perfused, lower extremity edema resolved Neuro: alert and oriented X3  Lab Results: Basic Metabolic Panel:  Recent Labs Lab 04/22/13 0849 04/23/13 0400  NA 138 141  K 4.4 3.7  CL 92* 94*  CO2 22 23  GLUCOSE 153* 141*  BUN 48* 52*  CREATININE 2.32* 2.38*  CALCIUM 6.1* 6.0*   Liver Function Tests:  Recent Labs Lab 04/21/13 1330 04/22/13 0156  AST 38* 38*  ALT 16 16  ALKPHOS 163* 175*  BILITOT 0.4 0.4  PROT 7.0 6.5  ALBUMIN 1.8* 1.6*   CBC:  Recent Labs Lab 04/21/13 1330  04/23/13 0400 04/23/13 1718 04/24/13 0527  WBC 13.3*  < > 10.2 10.3 11.7*  NEUTROABS 10.8*  --  8.0*  --   --   HGB 7.1*  < > 7.6* 8.5* 7.9*  HCT 22.9*  < > 24.5* 26.7* 25.7*  MCV 74.4*  < > 74.2* 74.6* 75.8*  PLT 364  < > 302 275 265  < > = values in this interval not displayed. Cardiac Enzymes:  Recent Labs Lab 04/21/13 1330  TROPONINI  <0.30   BNP:  Recent Labs Lab 04/21/13 1330  PROBNP 29444.0*   CBG:  Recent Labs Lab 04/23/13 0551 04/23/13 1108 04/23/13 1640 04/23/13 2200 04/24/13 0607 04/24/13 1113  GLUCAP 121* 158* 152* 173* 131* 168*   Studies/Results: Dg Chest 2 View  04/24/2013   CLINICAL DATA:  Dyspnea  EXAM: CHEST  2 VIEW  COMPARISON:  Prior radiograph from 04/22/2013  FINDINGS: Cardiomegaly is stable as compared to the prior exam.  Lungs are mildly hypoinflated. There is diffuse pulmonary vascular congestion with indistinctness of the interstitial markings, compatible with pulmonary edema, worsened. Small bilateral pleural effusions are present. More confluent bilateral perihilar opacities likely reflect alveolar edema. No definite focal infiltrates identified. No pneumothorax.  Surgical clips overlie the left axilla and lower neck. No acute osseous abnormality.  IMPRESSION: Cardiomegaly with small bilateral pleural effusions and moderate diffuse pulmonary edema, worsened as compared to 04/22/13.   Electronically Signed   By: Jeannine Boga M.D.   On: 04/24/2013 03:12   Dg Chest 2 View  04/22/2013   CLINICAL DATA:  Shortness of breath and weakness  EXAM:  CHEST  2 VIEW  COMPARISON:  04/21/2013  FINDINGS: Clips project over the neck from presumed thyroidectomy. Interstitial edema, trace effusions, and curvilinear bilateral lower lobe presumed atelectasis are again noted. There has been interval increase in hazy predominantly dependent bilateral lower lobe airspace opacity. Cardiomegaly persists. Left axillary clips are noted.  IMPRESSION: Slight increase in bibasilar opacities likely progressive alveolar superimposed on interstitial pulmonary edema.   Electronically Signed   By: Conchita Paris M.D.   On: 04/22/2013 16:32   Medications: I have reviewed the patient's current medications. Scheduled Meds: . atorvastatin  80 mg Oral q1800  . diltiazem  120 mg Oral q morning - 10a  . feeding supplement  (GLUCERNA SHAKE)  237 mL Oral BID BM  . furosemide  40 mg Intravenous Q12H  . insulin aspart  0-9 Units Subcutaneous TID WC  . insulin glargine  15 Units Subcutaneous QHS  . isosorbide mononitrate  30 mg Oral q morning - 10a  . levothyroxine  150 mcg Oral QAC breakfast  . metoprolol  100 mg Oral BID  . mycophenolate  720 mg Oral BID  . pantoprazole  40 mg Oral Daily  . sirolimus  2 mg Oral q morning - 10a  . sodium chloride  3 mL Intravenous Q12H   Continuous Infusions:  PRN Meds:.albuterol, diphenhydrAMINE, oxyCODONE  Assessment/Plan: 63 year old male with a PMH of HTN, DM type 2, CAD (s/p MI and LAD stent), kidney transplantation, carotid artery stenosis (s/p CEA 08/2011), PVD (s/p fem-pop in 1980), dHF (EF 45-50% Jan 2015), hx of papillary thyroid cancer and prostate cancer who presents with complaint of edema x 3 days and dyspnea for several weeks.   Volume overload: Likely 2/2 worsening CHF in the setting of CKD about 13ys s/p renal transplant, on sirolimus and mycophenolate. Pt w/ worsening dyspnea and lower extremity edema, proBNP [phone] and pulmonary edema on CXR on admission.  2D ECHO reveals worsening of his CHF, now with and EF of 41-28%, severe systolic dysfunction, and diffuse hypokinesis. Nephrology consulted and agrees with continued diuresis. Also checking sirolimus levels, as there was concern for toxicity at his last admission in January.  His dyspnea and lower extremity edema improved with Lasix.  However, his abdomen still feels distended and worsening pulmonary edema on CXR.  Only down -2.4L since admission. 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 Baseline Cr 1.9-2.5. Creatine 2.24 on admisison.  Nephrology following. Continuing Sirolimus and Myfortic. Serum Sirolimus level pending. - Continue anti-rejection meds - F/u Sirolimus level  Acute on chronic  anemia: Hgb 7.1 on admission and tx 1u pRBCs, responded to 8.1. Hgb 7.9 this morning. No active bleeding.  - q12 CBC  Hypocalcemia: 6.3 on admission and he received calcium gluconate.  Repeat on 12/15 was 6.1 w/ albumin 1.6. Corrected Ca was 8.0. However ionized Calcium was low at 0.71. Replacing Calcium.   DM type 2: Stable. Previously on 24 units Lantus at home, but wife reported hypoglycemic episodes, so she was administering 12 units over the past few days.  Pt started on Lantus 15u on admission w/ SSI. - continue Lantus 15 units qHS and SSI   HTN: Stable. Continued home medications on admission: Cardizem, Lopressor   H/o prostate cancer: Question of spinal mets at last hospitalization, reportedly seen on MRI, but negative bone scan 02/2013. Pt seen by Dr. Diona Fanti with Urology,  and per his last hospital d/c, wanted the pt to be worked up as an outpatient. He has not required pain medication while in the hospital.  - Continue Oxycodone IR 10mg  q 4h PRN  DVT ppx: SCDs  Dispo: Disposition is deferred at this time, awaiting improvement of current medical problems.  Anticipated discharge in approximately 1-2 day(s).   The patient does have a current PCP (Sueanne Margarita, MD) and may need an [facility] hospital follow-up appointment after discharge.  The patient does not know have transportation limitations that hinder transportation to clinic appointments.  .Services Needed at time of discharge: Y = Yes, Blank = No PT:   OT:   RN:   Equipment:   Other:     LOS: 3 days   Joshua Maxin, DO 04/24/2013, 12:42 PM

## 2013-04-24 NOTE — Clinical Documentation Improvement (Signed)
Presents with acute on chronic systolic and diastolic heart failure, stage 3-4 CKD. Nutritional Consult on 2/14 notes Severe Malnutrition in context of chronic illness. This diagnosis not seen in progress notes and is not on Problem List.    Recommendation made: add Glucerna Shake PO BID   Please assess consult and render an opinion of, including the severity, in your next progress note and discharge summary.  Thank You, Zoila Shutter ,RN Clinical Documentation Specialist:  [place] [employer]

## 2013-04-24 NOTE — Progress Notes (Signed)
MD aware of Hgb being 7.9. No new orders given at this time. Will continue to monitor patient for further changes.

## 2013-04-24 NOTE — Progress Notes (Signed)
Pt c/o SOB early this AM. Pt repositioned in bed, breath sounds assessed, and 02 sat 98% on 2L [place]. Rhonchi and wheezes auscultated, but no crackles heard. Pt was not currently eligible for PRN duoneb tx. MD made aware. Orders received for STAT duoneb and STAT cxr. CXR revealed small bilateral pleural effusions and worsening diffuse pulmonary edema. This was worse than previous pCXR performed on 04/22/13. Scheduled IV lasix given early. Will continue to monitor.

## 2013-04-24 NOTE — Plan of Care (Signed)
Problem: Phase I Progression Outcomes Goal: EF % per last Echo/documented,Core Reminder form on chart Outcome: Completed/Met Date Met:  04/24/13 EF 30-35% per ECHO performed on 04/22/13.

## 2013-04-24 NOTE — Progress Notes (Signed)
Report given to receiving RN. Patient in bed resting watching TV. No verbal complaints and no signs or symptoms of distress or discomfort. 

## 2013-04-24 NOTE — Progress Notes (Signed)
[facility]  Patient Status: Active (receiving services up to time of hospitalization)  [facility] is providing the following services: RN, PT and OT  If patient discharges after hours, please call [phone].   Joshua [name] 04/24/2013, 2:46 PM

## 2013-04-25 ENCOUNTER — Encounter: Payer: Medicare Other | Admitting: Nurse Practitioner

## 2013-04-25 DIAGNOSIS — N179 Acute kidney failure, unspecified: Secondary | ICD-10-CM

## 2013-04-25 DIAGNOSIS — I509 Heart failure, unspecified: Secondary | ICD-10-CM

## 2013-04-25 DIAGNOSIS — I5043 Acute on chronic combined systolic (congestive) and diastolic (congestive) heart failure: Principal | ICD-10-CM

## 2013-04-25 DIAGNOSIS — I2510 Atherosclerotic heart disease of native coronary artery without angina pectoris: Secondary | ICD-10-CM

## 2013-04-25 LAB — RENAL FUNCTION PANEL
Albumin: 1.8 g/dL — ABNORMAL LOW (ref 3.5–5.2)
BUN: 46 mg/dL — ABNORMAL HIGH (ref 6–23)
CHLORIDE: 94 meq/L — AB (ref 96–112)
CO2: 25 meq/L (ref 19–32)
CREATININE: 2.19 mg/dL — AB (ref 0.50–1.35)
Calcium: 6.6 mg/dL — ABNORMAL LOW (ref 8.4–10.5)
GFR calc Af Amer: 36 mL/min — ABNORMAL LOW (ref >90)
GFR, EST NON AFRICAN AMERICAN: 31 mL/min — AB (ref >90)
Glucose, Bld: 90 mg/dL (ref 70–99)
Phosphorus: 5.5 mg/dL — ABNORMAL HIGH (ref 2.3–4.6)
Potassium: 3.4 mEq/L — ABNORMAL LOW (ref 3.7–5.3)
Sodium: 142 mEq/L (ref 137–147)

## 2013-04-25 LAB — CBC
HCT: 23.7 % — ABNORMAL LOW (ref 39.0–52.0)
HEMATOCRIT: 24.5 % — AB (ref 39.0–52.0)
Hemoglobin: 7.7 g/dL — ABNORMAL LOW (ref 13.0–17.0)
Hemoglobin: 7.2 g/dL — ABNORMAL LOW (ref 13.0–17.0)
MCH: 23.6 pg — ABNORMAL LOW (ref 26.0–34.0)
MCH: 22.9 pg — ABNORMAL LOW (ref 26.0–34.0)
MCHC: 31.4 g/dL (ref 30.0–36.0)
MCHC: 30.4 g/dL (ref 30.0–36.0)
MCV: 75.2 fL — AB (ref 78.0–100.0)
MCV: 75.2 fL — AB (ref 78.0–100.0)
PLATELETS: 289 10*3/uL (ref 150–400)
Platelets: ADEQUATE 10*3/uL (ref 150–400)
RBC: 3.26 MIL/uL — AB (ref 4.22–5.81)
RBC: 3.15 MIL/uL — AB (ref 4.22–5.81)
RDW: 22 % — ABNORMAL HIGH (ref 11.5–15.5)
RDW: 21.5 % — ABNORMAL HIGH (ref 11.5–15.5)
WBC: 12.3 10*3/uL — AB (ref 4.0–10.5)
WBC: 13.3 10*3/uL — AB (ref 4.0–10.5)

## 2013-04-25 LAB — GLUCOSE, CAPILLARY
GLUCOSE-CAPILLARY: 101 mg/dL — AB (ref 70–99)
GLUCOSE-CAPILLARY: 143 mg/dL — AB (ref 70–99)
Glucose-Capillary: 89 mg/dL (ref 70–99)
Glucose-Capillary: 94 mg/dL (ref 70–99)

## 2013-04-25 MED ORDER — SODIUM CHLORIDE 0.9 % IV SOLN
1.0000 g | Freq: Two times a day (BID) | INTRAVENOUS | Status: AC
  Administered 2013-04-25 (×2): 1 g via INTRAVENOUS
  Filled 2013-04-25 (×2): qty 10

## 2013-04-25 MED ORDER — FUROSEMIDE 10 MG/ML IJ SOLN
80.0000 mg | Freq: Two times a day (BID) | INTRAMUSCULAR | Status: DC
  Administered 2013-04-25 – 2013-04-27 (×4): 80 mg via INTRAVENOUS
  Filled 2013-04-25 (×5): qty 8

## 2013-04-25 MED ORDER — POTASSIUM CHLORIDE CRYS ER 20 MEQ PO TBCR
20.0000 meq | EXTENDED_RELEASE_TABLET | Freq: Once | ORAL | Status: AC
  Administered 2013-04-25: 20 meq via ORAL
  Filled 2013-04-25: qty 1

## 2013-04-25 MED ORDER — CEFAZOLIN SODIUM 1-5 GM-% IV SOLN: 1.0000 g | Freq: Once | INTRAVENOUS | Status: DC

## 2013-04-25 MED ORDER — CEFAZOLIN SODIUM-DEXTROSE 2-3 GM-% IV SOLR
2.0000 g | Freq: Once | INTRAVENOUS | Status: AC
  Administered 2013-04-26: 2 g via INTRAVENOUS
  Filled 2013-04-25: qty 50

## 2013-04-25 MED ORDER — ISOSORBIDE MONONITRATE ER 60 MG PO TB24
60.0000 mg | ORAL_TABLET | Freq: Every morning | ORAL | Status: DC
  Administered 2013-04-25 – 2013-04-27 (×3): 60 mg via ORAL
  Filled 2013-04-25 (×3): qty 1

## 2013-04-25 NOTE — Progress Notes (Signed)
Patient ID: Joshua [name], male   DOB: [birth year], 63 y.o.   MRN: [id number] S:no new complaints O:BP 150/64  Pulse 94  Temp(Src) 98.3 F (36.8 C) (Oral)  Resp 19  Ht 5\' 11"  (1.803 m)  Wt 82.9 kg (182 lb 12.2 oz)  BMI 25.50 kg/m2  SpO2 94%  Intake/Output Summary (Last 24 hours) at 04/25/13 1157 Last data filed at 04/25/13 0900  Gross per 24 hour  Intake    358 ml  Output   1700 ml  Net  -1342 ml   Intake/Output: I/O last 3 completed shifts: In: 5 [P.O.:358] Out: 2725 [Urine:2725]  Intake/Output this shift:  Total I/O In: 240 [P.O.:240] Out: 600 [Urine:600] Weight change: -0.4 kg (-14.1 oz) Gen:WD WN AAM in NAD CVS:no rub Resp:occ exp wheezes bilaterally SKA:JGOTLX Ext:no edema, LUE AVF +T/B   Recent Labs Lab 04/19/13 0025 04/21/13 1330 04/22/13 0156 04/22/13 0849 04/23/13 0400 04/24/13 1341 04/25/13 0425  NA 140 138 139 138 141 141 142  K 4.2 4.5 4.5 4.4 3.7 3.7 3.4*  CL 92* 93* 93* 92* 94* 93* 94*  CO2 25 24 22 22 23 24 25   GLUCOSE 82 132* 179* 153* 141* 138* 90  BUN 44* 45* 47* 48* 52* 48* 46*  CREATININE 2.48* 2.24* 2.27* 2.32* 2.38* 2.22* 2.19*  ALBUMIN 2.0* 1.8* 1.6*  --   --   --  1.8*  CALCIUM 6.3* 6.1* 6.0* 6.1* 6.0* 6.7* 6.6*  PHOS  --   --   --   --   --   --  5.5*  AST 42* 38* 38*  --   --   --   --   ALT 16 16 16   --   --   --   --    Liver Function Tests:  Recent Labs Lab 04/19/13 0025 04/21/13 1330 04/22/13 0156 04/25/13 0425  AST 42* 38* 38*  --   ALT 16 16 16   --   ALKPHOS 191* 163* 175*  --   BILITOT 0.4 0.4 0.4  --   PROT 7.4 7.0 6.5  --   ALBUMIN 2.0* 1.8* 1.6* 1.8*   No results found for this basename: LIPASE, AMYLASE,  in the last 168 hours No results found for this basename: AMMONIA,  in the last 168 hours CBC:  Recent Labs Lab 04/19/13 0025 04/21/13 1330  04/23/13 0400 04/23/13 1718 04/24/13 0527 04/24/13 1726 04/25/13 0425  WBC 13.3* 13.3*  < > 10.2 10.3 11.7* 11.1* 12.3*  NEUTROABS 10.7* 10.8*  --  8.0*   --   --   --   --   HGB 8.0* 7.1*  < > 7.6* 8.5* 7.9* 7.4* 7.7*  HCT 26.1* 22.9*  < > 24.5* 26.7* 25.7* 23.6* 24.5*  MCV 74.6* 74.4*  < > 74.2* 74.6* 75.8* 74.9* 75.2*  PLT 370 364  < > 302 275 265 289 PLATELET CLUMPS NOTED ON SMEAR, COUNT APPEARS ADEQUATE  < > = values in this interval not displayed. Cardiac Enzymes:  Recent Labs Lab 04/21/13 1330  TROPONINI <0.30   CBG:  Recent Labs Lab 04/24/13 0607 04/24/13 1113 04/24/13 1601 04/24/13 2133 04/25/13 0535  GLUCAP 131* 168* 186* 144* 89    Iron Studies: No results found for this basename: IRON, TIBC, TRANSFERRIN, FERRITIN,  in the last 72 hours Studies/Results: Dg Chest 2 View  04/24/2013   CLINICAL DATA:  Dyspnea  EXAM: CHEST  2 VIEW  COMPARISON:  Prior radiograph from 04/22/2013  FINDINGS: Cardiomegaly is stable  as compared to the prior exam.  Lungs are mildly hypoinflated. There is diffuse pulmonary vascular congestion with indistinctness of the interstitial markings, compatible with pulmonary edema, worsened. Small bilateral pleural effusions are present. More confluent bilateral perihilar opacities likely reflect alveolar edema. No definite focal infiltrates identified. No pneumothorax.  Surgical clips overlie the left axilla and lower neck. No acute osseous abnormality.  IMPRESSION: Cardiomegaly with small bilateral pleural effusions and moderate diffuse pulmonary edema, worsened as compared to 04/22/13.   Electronically Signed   By: Jeannine Boga M.D.   On: 04/24/2013 03:12   . atorvastatin  80 mg Oral q1800  . calcium gluconate  1 g Intravenous BID  . diltiazem  120 mg Oral q morning - 10a  . feeding supplement (GLUCERNA SHAKE)  237 mL Oral BID BM  . furosemide  40 mg Intravenous Q12H  . insulin aspart  0-9 Units Subcutaneous TID WC  . insulin glargine  15 Units Subcutaneous QHS  . isosorbide mononitrate  60 mg Oral q morning - 10a  . levothyroxine  150 mcg Oral QAC breakfast  . metoprolol  100 mg Oral BID  .  mycophenolate  720 mg Oral BID  . pantoprazole  40 mg Oral Daily  . potassium chloride  20 mEq Oral Once  . sirolimus  2 mg Oral q morning - 10a  . sodium chloride  3 mL Intravenous Q12H    BMET    Component Value Date/Time   NA 142 04/25/2013 0425   K 3.4* 04/25/2013 0425   CL 94* 04/25/2013 0425   CO2 25 04/25/2013 0425   GLUCOSE 90 04/25/2013 0425   BUN 46* 04/25/2013 0425   CREATININE 2.19* 04/25/2013 0425   CALCIUM 6.6* 04/25/2013 0425   GFRNONAA 31* 04/25/2013 0425   GFRAA 36* 04/25/2013 0425   CBC    Component Value Date/Time   WBC 12.3* 04/25/2013 0425   RBC 3.26* 04/25/2013 0425   RBC 3.35* 03/27/2013 0930   HGB 7.7* 04/25/2013 0425   HCT 24.5* 04/25/2013 0425   PLT PLATELET CLUMPS NOTED ON SMEAR, COUNT APPEARS ADEQUATE 04/25/2013 0425   MCV 75.2* 04/25/2013 0425   MCH 23.6* 04/25/2013 0425   MCHC 31.4 04/25/2013 0425   RDW 22.0* 04/25/2013 0425   LYMPHSABS 1.7 04/23/2013 0400   MONOABS 0.4 04/23/2013 0400   EOSABS 0.1 04/23/2013 0400   BASOSABS 0.0 04/23/2013 0400     Assessment/Plan:  1. 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 Scr stable, cont with rapamune/myfortic 2. pulm edema/volume excess- improving clinically and diuresing but unsure why he has had such a quick readmission.  1. Required extra dose of IV lasix last pm with improvement of symptoms.  Agree with Imdue and consider Cardiology re-eval 3. CHF systolic (EF 60%) 4. Prostate Ca - ?spinal mets and due to CT guided biopsy in March, maybe can be done as an inpt while he is here. 5. CAD s/p PTA with stent, + myoview but no cardiac cath due to CKD 6. Hypocalcemia- on IV Ca 7. Anemia- no on ESA and has h/o prostate CA. Transfuse prn. 8. Atrial tachycardia- on po MTP and dilt 9.  [place] A

## 2013-04-25 NOTE — Progress Notes (Signed)
Subjective: Joshua [name] was seen and examined this morning.  He is feeling well and denies dyspnea or chest pain.    Objective: Vital signs in last 24 hours: Filed Vitals:   04/25/13 0545 04/25/13 0924 04/25/13 0927 04/25/13 1304  BP: 147/62 150/64  145/64  Pulse: 82  94 90  Temp: 98.3 F (36.8 C)   98 F (36.7 C)  TempSrc: Oral   Oral  Resp: 19   18  Height:      Weight: 82.9 kg (182 lb 12.2 oz)     SpO2: 94%   98%   Weight change: -0.4 kg (-14.1 oz)  Intake/Output Summary (Last 24 hours) at 04/25/13 1355 Last data filed at 04/25/13 1300  Gross per 24 hour  Intake    478 ml  Output   1700 ml  Net  -1222 ml   General: Lying in bed, NAD; tremulous Cardiac: RRR, no rubs, murmurs or gallops Pulm: wheezing at bases (improved since yesterday), no crackles appreciated Abd: soft, improved distention , BS present GU:  ~ 500 cc clear yellow urine in foley bag (has condom cath) Ext: warm and well perfused, lower extremity edema resolved Neuro: alert and oriented X3  Lab Results: Basic Metabolic Panel:  Recent Labs Lab 04/24/13 1341 04/25/13 0425  NA 141 142  K 3.7 3.4*  CL 93* 94*  CO2 24 25  GLUCOSE 138* 90  BUN 48* 46*  CREATININE 2.22* 2.19*  CALCIUM 6.7* 6.6*  PHOS  --  5.5*   Liver Function Tests:  Recent Labs Lab 04/21/13 1330 04/22/13 0156 04/25/13 0425  AST 38* 38*  --   ALT 16 16  --   ALKPHOS 163* 175*  --   BILITOT 0.4 0.4  --   PROT 7.0 6.5  --   ALBUMIN 1.8* 1.6* 1.8*   CBC:  Recent Labs Lab 04/21/13 1330  04/23/13 0400  04/24/13 1726 04/25/13 0425  WBC 13.3*  < > 10.2  < > 11.1* 12.3*  NEUTROABS 10.8*  --  8.0*  --   --   --   HGB 7.1*  < > 7.6*  < > 7.4* 7.7*  HCT 22.9*  < > 24.5*  < > 23.6* 24.5*  MCV 74.4*  < > 74.2*  < > 74.9* 75.2*  PLT 364  < > 302  < > 289 PLATELET CLUMPS NOTED ON SMEAR, COUNT APPEARS ADEQUATE  < > = values in this interval not displayed.  CBG:  Recent Labs Lab 04/24/13 0607 04/24/13 1113  04/24/13 1601 04/24/13 2133 04/25/13 0535 04/25/13 1157  GLUCAP 131* 168* 186* 144* 89 101*   Medications: I have reviewed the patient's current medications. Scheduled Meds: . atorvastatin  80 mg Oral q1800  . calcium gluconate  1 g Intravenous BID  . [START ON 04/26/2013]  ceFAZolin (ANCEF) IV  2 g Intravenous Once  . diltiazem  120 mg Oral q morning - 10a  . feeding supplement (GLUCERNA SHAKE)  237 mL Oral BID BM  . furosemide  80 mg Intravenous Q12H  . insulin aspart  0-9 Units Subcutaneous TID WC  . insulin glargine  15 Units Subcutaneous QHS  . isosorbide mononitrate  60 mg Oral q morning - 10a  . levothyroxine  150 mcg Oral QAC breakfast  . metoprolol  100 mg Oral BID  . mycophenolate  720 mg Oral BID  . pantoprazole  40 mg Oral Daily  . sirolimus  2 mg Oral q morning - 10a  .  sodium chloride  3 mL Intravenous Q12H   Continuous Infusions: none PRN Meds:.albuterol, diphenhydrAMINE, oxyCODONE  Assessment/Plan: 63 year old male with a PMH of HTN, DM type 2, CAD (s/p MI and LAD stent), kidney transplantation, carotid artery stenosis (s/p CEA 08/2011), PVD (s/p fem-pop in 1980), dHF (EF 45-50% Jan 2015), hx of papillary thyroid cancer and prostate cancer who presents with complaint of edema x 3 days and dyspnea for several weeks.   Volume overload: Likely 2/2 worsening CHF in the setting of CKD about 13ys s/p renal transplant, on sirolimus and mycophenolate.  Symptoms improving with diuresis.  Down 3.6L and 6 pounds since admission.  However, increasing pulmonary edema on CXR yesterday and worsening EF concerning for ischemia.  Cardiology consulted - is patient a candidate for cath given renal failure and transplant history.  He is not a candidate for cath due to renal failure and GIB hx.  Also, not candidate for ICD given co-morbidities and poor functional status.  Cardiology feels this is probably not ischemia mediated HF and that the major issue is volume overload.  Appreciate  Cardiology and Nephrology following. 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 Baseline Cr 1.9-2.5. Creatine 2.24 on admisison.  Nephrology following. Continuing Sirolimus and Myfortic.  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  Followed by Dr. Diona Fanti (Urology).  Has been scheduled for T10 bone biopsy and kyphoplasty tomorrow. - NPO past MN  Acute on chronic anemia: Hgb 7.1 on admission and tx 1u pRBCs, responded to 8.1. Hgb 7.7 this morning. No active bleeding.  - q12 CBC  Hypocalcemia: 6.3 on admission and he received calcium gluconate.  Repeat on 12/15 was 6.1 w/ albumin 1.6. Corrected Ca was 8.0. However ionized Calcium was low at 0.71. Replacing Calcium.   DM type 2: Stable. Previously on 24 units Lantus at home, but wife reported hypoglycemic episodes, so she was administering 12 units over the past few days.  Pt started on Lantus 15u on admission w/ SSI. - continue Lantus 15 units qHS and SSI   HTN: Stable. Continued home medications on admission: Cardizem, Lopressor   H/o prostate cancer: Question of spinal mets at last hospitalization, reportedly seen on MRI, but negative bone scan 02/2013. Pt seen by Dr. Diona Fanti with Urology, and per his last hospital d/c, wanted the pt to be worked up as an outpatient.  - Continue Oxycodone IR 10mg  q 4h PRN - plan for T10 biopsy tomorrow  DVT ppx: SCDs  Dispo: Disposition is deferred at this time, awaiting improvement of current medical problems.  Anticipated discharge in approximately 1-2 day(s).   The patient does have a current PCP (Sueanne Margarita, MD) and may need an [facility] hospital follow-up appointment after discharge.  The patient does not know have transportation limitations that hinder transportation to  clinic appointments.  .Services Needed at time of discharge: Y = Yes, Blank = No PT:   OT:   RN:   Equipment:   Other:     LOS: 4 days   Duwaine Maxin, DO 04/25/2013, 1:55 PM

## 2013-04-25 NOTE — Progress Notes (Signed)
Pt with sacral decub stage 2-3, with small drainage.  Dr. Eulas Post made aware and requested to put in wound consult to see pt.  Will continue to monitor.  Karie Kirks, Therapist, sports.

## 2013-04-25 NOTE — Progress Notes (Signed)
Pt with order to d/c tele as ordered by Dr. Luisa Dago.  Called and verified order that telemetry may d/c today with Dr. Redmond Pulling instructed is ok to d/c monitor.  Will continue to monitor.  Karie Kirks, Therapist, sports.

## 2013-04-25 NOTE — Progress Notes (Signed)
Internal Medicine Attending  Date: 04/25/2013  Patient name: Joshua [name] Medical record number: [id number] Date of birth: 1950-12-03 Age: 63 y.o. Gender: male  I saw and evaluated the patient. I reviewed the resident's note by Dr. Redmond Pulling and I agree with the resident's findings and plans as documented in her progress note.  Mr. Carlile notes subjective improvement in his dyspnea when we saw him on rounds this AM.  He is down 6 pounds since admission, but remains volume overloaded.  Appreciate Cardiology answering our specific question as this helps [place] focus his evaluation and therapy.  Will ask Nephrology if cardizem is necessary for proteinuria or if we can discontinue to allow more aggressive and specific therapy for his heart failure.  More aggressive diuresis is the plan for now, carefully watching his renal function.

## 2013-04-25 NOTE — Progress Notes (Signed)
Pt's wife Eziah Negro called and requesting update.  Instructed that pt is stable and pt informed her he is having procedure done tomorrow on his back.  Instructed that is correct and stated she will come in tomorrow.. Oncoming nurse made aware.  Karie Kirks, Therapist, sports.

## 2013-04-25 NOTE — Consult Note (Addendum)
Reason for Consult: CHF Referring Physician:   [name] is an 63 y.o. male.  HPI:   Joshua [name] is a 63 y.o. male with a history of CAD, HTN ,mild AS, dyslipidemia, PAD s/p CEA and fem-pop and  ESRD s/p Renal transplant with CKD now Cr 2.2-2.5. We are consulted for help with HF management.    Seen by Dr Radford Pax in November.  At that time had some chest pain that she thought was atypical.  Last cath.  2011 with BMS to LAD and chronically occluded RCA.   He was hospitalized in January with atypical CP.  He under went a nuclear stress test which was inadequate as he was tachycardic with both rest and stress portions and never received Lexiscan. EF 34% ? Mild ischemia in inferolateral wall.  Echo during that admission, EF 45-50%.  He also had acute kidney injury thought to be ATN secondary to GIB.  He also has prostate cancer with  METS to spine s/p radiation therapy.  Marland Kitchen He was re admitted with SOB and anasarca. Has diuresed 6 pounds and feeling better however had an episode of pulmonary edema 2 nights ago treated with IV lasix. CXR with effusions and worsening CHF. Weight now 182. His DC weight last was around 181#. Denies CP.     Past Medical History  Diagnosis Date  . Carotid artery occlusion   . ASCVD (arteriosclerotic cardiovascular disease)   . CHF (congestive heart failure)   . Anginal pain     last chest pain was a couple of months ago-just lasted less than a minute  . Stroke     "light stroke" about time of MI  . Renal failure     s/p renal transplant  . Aortic stenosis     moderate AS by 06/2011 echo (Dr. Fransico Him)  . Coronary artery disease 03/2009    2 vessel s/p BMS to LAD in setting of NSTEMI, RCA chronically occluded  . PVD (peripheral vascular disease)     s/p fempop 1980's  . Hypothyroidism     s/p papillary thyroid CA excision  . Hypertension   . Hyperlipidemia   . Diastolic dysfunction   . Type II diabetes mellitus   . Prostate cancer     "radiation  tx ~ 4-5 yr ago; cleared then came back a couple months ago" (03/20/2013)  . Plavix resistance     a. P2y12 365 in 03/2013.  . End stage renal disease- s/p transplant 04/03/2013    Patient states he started dialysis sometime around 1981-1982.  He then received a kidney transplant in 2002 at [facility].  The transplant kidney is still functioning, he sees Dr Florene Glen at [facility] about every 6 months as of 2015.      Past Surgical History  Procedure Laterality Date  . Nephrectomy transplanted organ Bilateral 2001  . Pr vein bypass graft,aorto-fem-pop  1980    fem pop   . Endarterectomy  08/25/2011    Procedure: ENDARTERECTOMY CAROTID;  Surgeon: Angelia Mould, MD;  Location: [facility];  Service: Vascular;  Laterality: Right;  . Thyroidectomy    . Coronary angioplasty with stent placement  03/2009    "1"  . Colonoscopy N/A 03/31/2013    Procedure: COLONOSCOPY;  Surgeon: Lafayette Dragon, MD;  Location: [facility] ENDOSCOPY;  Service: Endoscopy;  Laterality: N/A;  . Esophagogastroduodenoscopy N/A 03/31/2013    Procedure: ESOPHAGOGASTRODUODENOSCOPY (EGD);  Surgeon: Lafayette Dragon, MD;  Location: [facility] ENDOSCOPY;  Service: Endoscopy;  Laterality: N/A;  Family History  Problem Relation Age of Onset  . Diabetes Mother   . Hyperlipidemia Mother   . Hypertension Mother   . Heart disease Mother     before age 97  . Hyperlipidemia Father   . Hypertension Father   . Heart disease Father   . Hyperlipidemia Sister   . Hypertension Sister   . Diabetes Brother   . Hypertension Brother   . Hyperlipidemia Brother   . Seizures Brother     Social History:  reports that he has never smoked. He has never used smokeless tobacco. He reports that he does not drink alcohol or use illicit drugs.  Allergies:  Allergies  Allergen Reactions  . Fentanyl     "Almost died"   . Propofol     "Almost died"     Medications: Prior to Admission medications   Medication Sig Start Date End Date Taking? Authorizing Provider  aspirin  81 MG chewable tablet Chew 81 mg by mouth every morning.    Yes Historical Provider, MD  atorvastatin (LIPITOR) 80 MG tablet Take 80 mg by mouth every morning.   Yes Historical Provider, MD  Calcium Carbonate-Vit D-Min (CALCIUM 1200) 1200-1000 MG-UNIT CHEW Chew 1 tablet by mouth daily.   Yes Historical Provider, MD  diltiazem (CARDIZEM CD) 120 MG 24 hr capsule Take 120 mg by mouth every morning. 04/05/13  Yes Rebecca Eaton, MD  insulin glargine (LANTUS) 100 UNIT/ML injection Inject 24 Units into the skin at bedtime.    Yes Historical Provider, MD  insulin lispro (HUMALOG) 100 UNIT/ML injection Inject 6 Units into the skin 3 (three) times daily as needed (for sugars over 280).    Yes Historical Provider, MD  isosorbide mononitrate (IMDUR) 30 MG 24 hr tablet Take 30 mg by mouth every morning. 04/05/13  Yes Rebecca Eaton, MD  levothyroxine (SYNTHROID, LEVOTHROID) 150 MCG tablet Take 150 mcg by mouth daily before breakfast.    Yes Historical Provider, MD  methocarbamol (ROBAXIN) 500 MG tablet Take 500 mg by mouth 2 (two) times daily. 02/09/13  Yes Billy Fischer, MD  metoprolol (LOPRESSOR) 50 MG tablet Take 100 mg by mouth 2 (two) times daily. 04/05/13  Yes Rebecca Eaton, MD  mycophenolate (MYFORTIC) 180 MG EC tablet Take 720 mg by mouth 2 (two) times daily.    Yes Historical Provider, MD  Oxycodone HCl 10 MG TABS Take 10 mg by mouth every 3 (three) hours as needed for severe pain (give with one percocet). 04/05/13  Yes Rebecca Eaton, MD  pantoprazole (PROTONIX) 40 MG tablet Take 40 mg by mouth daily.   Yes Historical Provider, MD  sirolimus (RAPAMUNE) 2 MG tablet Take 2 mg by mouth daily.   Yes Historical Provider, MD  sodium bicarbonate 650 MG tablet Take 650 mg by mouth 2 (two) times daily. 04/05/13  Yes Rebecca Eaton, MD  Vitamin D, Ergocalciferol, (DRISDOL) 50000 UNITS CAPS capsule Take 50,000 Units by mouth every Thursday.   Yes Historical Provider, MD     Results for orders placed  during the hospital encounter of 04/21/13 (from the past 48 hour(s))  GLUCOSE, CAPILLARY     Status: Abnormal   Collection Time    04/23/13  4:40 PM      Result Value Ref Range   Glucose-Capillary 152 (*) 70 - 99 mg/dL   Comment 1 Documented in Chart     Comment 2 Notify RN    CBC     Status: Abnormal   Collection Time  04/23/13  5:18 PM      Result Value Ref Range   WBC 10.3  4.0 - 10.5 K/uL   RBC 3.58 (*) 4.22 - 5.81 MIL/uL   Hemoglobin 8.5 (*) 13.0 - 17.0 g/dL   HCT 26.7 (*) 39.0 - 52.0 %   MCV 74.6 (*) 78.0 - 100.0 fL   MCH 23.7 (*) 26.0 - 34.0 pg   MCHC 31.8  30.0 - 36.0 g/dL   RDW 20.8 (*) 11.5 - 15.5 %   Platelets 275  150 - 400 K/uL  SODIUM, URINE, RANDOM     Status: None   Collection Time    04/23/13  9:53 PM      Result Value Ref Range   Sodium, Ur 58    CREATININE, URINE, RANDOM     Status: None   Collection Time    04/23/13  9:53 PM      Result Value Ref Range   Creatinine, Urine 56.84    UREA NITROGEN, URINE     Status: None   Collection Time    04/23/13  9:53 PM      Result Value Ref Range   Urea Nitrogen, Ur 434     Comment: (NOTE)       No established reference range for random urine.     Performed at [place], CAPILLARY     Status: Abnormal   Collection Time    04/23/13 10:00 PM      Result Value Ref Range   Glucose-Capillary 173 (*) 70 - 99 mg/dL   Comment 1 Notify RN    CBC     Status: Abnormal   Collection Time    04/24/13  5:27 AM      Result Value Ref Range   WBC 11.7 (*) 4.0 - 10.5 K/uL   RBC 3.39 (*) 4.22 - 5.81 MIL/uL   Hemoglobin 7.9 (*) 13.0 - 17.0 g/dL   HCT 25.7 (*) 39.0 - 52.0 %   MCV 75.8 (*) 78.0 - 100.0 fL   MCH 23.3 (*) 26.0 - 34.0 pg   MCHC 30.7  30.0 - 36.0 g/dL   RDW 21.5 (*) 11.5 - 15.5 %   Platelets 265  150 - 400 K/uL  GLUCOSE, CAPILLARY     Status: Abnormal   Collection Time    04/24/13  6:07 AM      Result Value Ref Range   Glucose-Capillary 131 (*) 70 - 99 mg/dL  GLUCOSE, CAPILLARY      Status: Abnormal   Collection Time    04/24/13 11:13 AM      Result Value Ref Range   Glucose-Capillary 168 (*) 70 - 99 mg/dL   Comment 1 Notify RN    BASIC METABOLIC PANEL     Status: Abnormal   Collection Time    04/24/13  1:41 PM      Result Value Ref Range   Sodium 141  137 - 147 mEq/L   Potassium 3.7  3.7 - 5.3 mEq/L   Chloride 93 (*) 96 - 112 mEq/L   CO2 24  19 - 32 mEq/L   Glucose, Bld 138 (*) 70 - 99 mg/dL   BUN 48 (*) 6 - 23 mg/dL   Creatinine, Ser 2.22 (*) 0.50 - 1.35 mg/dL   Calcium 6.7 (*) 8.4 - 10.5 mg/dL   GFR calc non Af Amer 30 (*) >90 mL/min   GFR calc Af Amer 35 (*) >90 mL/min   Comment: (NOTE)  The eGFR has been calculated using the CKD EPI equation.     This calculation has not been validated in all clinical situations.     eGFR's persistently <90 mL/min signify possible Chronic Kidney     Disease.  GLUCOSE, CAPILLARY     Status: Abnormal   Collection Time    04/24/13  4:01 PM      Result Value Ref Range   Glucose-Capillary 186 (*) 70 - 99 mg/dL  CBC     Status: Abnormal   Collection Time    04/24/13  5:26 PM      Result Value Ref Range   WBC 11.1 (*) 4.0 - 10.5 K/uL   RBC 3.15 (*) 4.22 - 5.81 MIL/uL   Hemoglobin 7.4 (*) 13.0 - 17.0 g/dL   HCT 23.6 (*) 39.0 - 52.0 %   MCV 74.9 (*) 78.0 - 100.0 fL   MCH 23.5 (*) 26.0 - 34.0 pg   MCHC 31.4  30.0 - 36.0 g/dL   RDW 21.4 (*) 11.5 - 15.5 %   Platelets 289  150 - 400 K/uL  GLUCOSE, CAPILLARY     Status: Abnormal   Collection Time    04/24/13  9:33 PM      Result Value Ref Range   Glucose-Capillary 144 (*) 70 - 99 mg/dL  CBC     Status: Abnormal   Collection Time    04/25/13  4:25 AM      Result Value Ref Range   WBC 12.3 (*) 4.0 - 10.5 K/uL   Comment: WHITE COUNT CONFIRMED ON SMEAR     REPEATED TO VERIFY   RBC 3.26 (*) 4.22 - 5.81 MIL/uL   Hemoglobin 7.7 (*) 13.0 - 17.0 g/dL   HCT 24.5 (*) 39.0 - 52.0 %   MCV 75.2 (*) 78.0 - 100.0 fL   MCH 23.6 (*) 26.0 - 34.0 pg   MCHC 31.4  30.0 - 36.0  g/dL   RDW 22.0 (*) 11.5 - 15.5 %   Platelets    150 - 400 K/uL   Value: PLATELET CLUMPS NOTED ON SMEAR, COUNT APPEARS ADEQUATE  RENAL FUNCTION PANEL     Status: Abnormal   Collection Time    04/25/13  4:25 AM      Result Value Ref Range   Sodium 142  137 - 147 mEq/L   Potassium 3.4 (*) 3.7 - 5.3 mEq/L   Chloride 94 (*) 96 - 112 mEq/L   CO2 25  19 - 32 mEq/L   Glucose, Bld 90  70 - 99 mg/dL   BUN 46 (*) 6 - 23 mg/dL   Creatinine, Ser 2.19 (*) 0.50 - 1.35 mg/dL   Calcium 6.6 (*) 8.4 - 10.5 mg/dL   Phosphorus 5.5 (*) 2.3 - 4.6 mg/dL   Albumin 1.8 (*) 3.5 - 5.2 g/dL   GFR calc non Af Amer 31 (*) >90 mL/min   GFR calc Af Amer 36 (*) >90 mL/min   Comment: (NOTE)     The eGFR has been calculated using the CKD EPI equation.     This calculation has not been validated in all clinical situations.     eGFR's persistently <90 mL/min signify possible Chronic Kidney     Disease.  GLUCOSE, CAPILLARY     Status: None   Collection Time    04/25/13  5:35 AM      Result Value Ref Range   Glucose-Capillary 89  70 - 99 mg/dL  GLUCOSE, CAPILLARY  Status: Abnormal   Collection Time    04/25/13 11:57 AM      Result Value Ref Range   Glucose-Capillary 101 (*) 70 - 99 mg/dL   Comment 1 Notify RN      Dg Chest 2 View  04/24/2013   CLINICAL DATA:  Dyspnea  EXAM: CHEST  2 VIEW  COMPARISON:  Prior radiograph from 04/22/2013  FINDINGS: Cardiomegaly is stable as compared to the prior exam.  Lungs are mildly hypoinflated. There is diffuse pulmonary vascular congestion with indistinctness of the interstitial markings, compatible with pulmonary edema, worsened. Small bilateral pleural effusions are present. More confluent bilateral perihilar opacities likely reflect alveolar edema. No definite focal infiltrates identified. No pneumothorax.  Surgical clips overlie the left axilla and lower neck. No acute osseous abnormality.  IMPRESSION: Cardiomegaly with small bilateral pleural effusions and moderate diffuse  pulmonary edema, worsened as compared to 04/22/13.   Electronically Signed   By: Jeannine Boga M.D.   On: 04/24/2013 03:12    ROS Blood pressure 145/64, pulse 90, temperature 98 F (36.7 C), temperature source Oral, resp. rate 18, height $RemoveBe'5\' 11"'[id number]$  (1.803 m), weight 182 lb 12.2 oz (82.9 kg), SpO2 98.00%. Physical Exam General:  Chroniaally ill appearing appearing. No resp difficulty HEENT: normal x for poor dentition Neck: supple. no JVP 10cm Carotids 2+ bilat; no bruits. No lymphadenopathy or thryomegaly appreciated. Cor: PMI nonpalpable.  Regular rate & rhythm. Distant  No obvious rubs, gallops or murmurs. Lungs: +rhonchi dull at bases Abdomen: soft, nontender, + distended. No hepatosplenomegaly. No bruits or masses. Good bowel sounds. 2+ presacral edema Extremities: no cyanosis, clubbing, rash, tr edema + LUE AVF Neuro: alert & orientedx3, cranial nerves grossly intact. moves all 4 extremities w/o difficulty. Affect pleasant    Assessment/Plan: Principal Problem:   Volume overload Active Problems:   Hypertension   Dyslipidemia, goal LDL below 70   Anemia   End stage renal disease- s/p transplant   Controlled diabetes mellitus type 2 with complications   Acute on chronic combined systolic and diastolic CHF (congestive heart failure)  Plan: 63 yo male  a history of CAD, HTN ,mild AS, dyslipidemia, sp renal transplant, prostate cancer, GIB, presents with volume overload, SOB and decreased EF compared to prior echo in Jan 2015. Stress test in Jan indicated severe ischemia at rest but was not conclusive. Currently on Lasix $Remove'40mg'[id number]$  bid IV.  Cardiac cath will be difficult given Acute renal injury of his one kidney and recent GIB.  Hgb is low but stable.   Tarri Fuller 04/25/2013, 2:59 PM   Patient seen and examined with Tarri Fuller, PA-C. We discussed all aspects of the encounter. I agree with the assessment and plan as stated above.   Complicated case. I reviewed his stress test and  although this was suboptimal study, I doubt his ischemic burden is high enough to give him ischemically mediated CHF at rest. Echo shows significant systolic and diastolic dysfunction. However EF is similar to [facility] study last month.  Thus, I suspect the major issue here is that he has persistent volume overload.   At this point I would favor just pushing his diuresis a bit harder being mindful of his renal function. I discussed with Dr. Marval Regal and we will increase lasix to 80 IV bid. Can add metolazone as needed. Currently not candidate for cath due to renal failure and GIB. I also do not feel he is ICD candidate currently with his comorbidities and poor functional status.   Would  consider stopping diltiazem.   We will follow.   Benay Spice 3:46 PM

## 2013-04-25 NOTE — H&P (Signed)
Joshua [name] is an 63 y.o. male.   Chief Complaint: Back pain x weeks; Thoracic 10 lesion on previous MRI from outside facility Hx Prostate Cancer and thyroid ca Dr Retta Diones discussed with Dr Deanne Coffer Had been scheduled for T10 bx and Kyphoplasty as outpt at [facility] 2/20 Now inpt for CHF at [facility] --sxs resolving; stable Pt now scheduled for T10 bone lesion biopsy and kyphoplasty as In pt at [facility] Also noted is small sacral ulcer; not infectious looking; no redness; no purulence  Pt was admitted 2/5 for volume overload- Nephro seeing pt- diuresis ongoing  HPI: anemia; CAD; CHF; CVA;  ESRD-renal failure/transplant; Ao stenosis; PVD; hypothyroid; HTN; HLD; DM; Thyroid Ca; Prostate Ca  Past Medical History  Diagnosis Date  . Carotid artery occlusion   . ASCVD (arteriosclerotic cardiovascular disease)   . CHF (congestive heart failure)   . Anginal pain     last chest pain was a couple of months ago-just lasted less than a minute  . Stroke     "light stroke" about time of MI  . Renal failure     s/p renal transplant  . Aortic stenosis     moderate AS by 06/2011 echo (Dr. Armanda Magic)  . Coronary artery disease 03/2009    2 vessel s/p BMS to LAD in setting of NSTEMI, RCA chronically occluded  . PVD (peripheral vascular disease)     s/p fempop 1980's  . Hypothyroidism     s/p papillary thyroid CA excision  . Hypertension   . Hyperlipidemia   . Diastolic dysfunction   . Type II diabetes mellitus   . Prostate cancer     "radiation tx ~ 4-5 yr ago; cleared then came back a couple months ago" (03/20/2013)  . Plavix resistance     a. P2y12 365 in 03/2013.  . End stage renal disease- s/p transplant 04/03/2013    Patient states he started dialysis sometime around 1981-1982.  He then received a kidney transplant in 2002 at [facility].  The transplant kidney is still functioning, he sees Dr Lowell Guitar at [facility] about every 6 months as of 2015.      Past Surgical History  Procedure Laterality Date  .  Nephrectomy transplanted organ Bilateral 2001  . Pr vein bypass graft,aorto-fem-pop  1980    fem pop   . Endarterectomy  08/25/2011    Procedure: ENDARTERECTOMY CAROTID;  Surgeon: Chuck Hint, MD;  Location: [facility] OR;  Service: Vascular;  Laterality: Right;  . Thyroidectomy    . Coronary angioplasty with stent placement  03/2009    "1"  . Colonoscopy N/A 03/31/2013    Procedure: COLONOSCOPY;  Surgeon: Hart Carwin, MD;  Location: [facility] ENDOSCOPY;  Service: Endoscopy;  Laterality: N/A;  . Esophagogastroduodenoscopy N/A 03/31/2013    Procedure: ESOPHAGOGASTRODUODENOSCOPY (EGD);  Surgeon: Hart Carwin, MD;  Location: [facility] ENDOSCOPY;  Service: Endoscopy;  Laterality: N/A;    Family History  Problem Relation Age of Onset  . Diabetes Mother   . Hyperlipidemia Mother   . Hypertension Mother   . Heart disease Mother     before age 42  . Hyperlipidemia Father   . Hypertension Father   . Heart disease Father   . Hyperlipidemia Sister   . Hypertension Sister   . Diabetes Brother   . Hypertension Brother   . Hyperlipidemia Brother   . Seizures Brother    Social History:  reports that he has never smoked. He has never used smokeless tobacco. He reports that he  does not drink alcohol or use illicit drugs.  Allergies:  Allergies  Allergen Reactions  . Fentanyl     "Almost died"   . Propofol     "Almost died"     Medications Prior to Admission  Medication Sig Dispense Refill  . aspirin 81 MG chewable tablet Chew 81 mg by mouth every morning.       Marland Kitchen atorvastatin (LIPITOR) 80 MG tablet Take 80 mg by mouth every morning.      . Calcium Carbonate-Vit D-Min (CALCIUM 1200) 1200-1000 MG-UNIT CHEW Chew 1 tablet by mouth daily.      Marland Kitchen diltiazem (CARDIZEM CD) 120 MG 24 hr capsule Take 120 mg by mouth every morning.      . insulin glargine (LANTUS) 100 UNIT/ML injection Inject 24 Units into the skin at bedtime.       . insulin lispro (HUMALOG) 100 UNIT/ML injection Inject 6 Units into the  skin 3 (three) times daily as needed (for sugars over 280).       . isosorbide mononitrate (IMDUR) 30 MG 24 hr tablet Take 30 mg by mouth every morning.      Marland Kitchen levothyroxine (SYNTHROID, LEVOTHROID) 150 MCG tablet Take 150 mcg by mouth daily before breakfast.       . methocarbamol (ROBAXIN) 500 MG tablet Take 500 mg by mouth 2 (two) times daily.      . metoprolol (LOPRESSOR) 50 MG tablet Take 100 mg by mouth 2 (two) times daily.      . mycophenolate (MYFORTIC) 180 MG EC tablet Take 720 mg by mouth 2 (two) times daily.       . Oxycodone HCl 10 MG TABS Take 10 mg by mouth every 3 (three) hours as needed for severe pain (give with one percocet).      . pantoprazole (PROTONIX) 40 MG tablet Take 40 mg by mouth daily.      . sirolimus (RAPAMUNE) 2 MG tablet Take 2 mg by mouth daily.      . sodium bicarbonate 650 MG tablet Take 650 mg by mouth 2 (two) times daily.      . Vitamin D, Ergocalciferol, (DRISDOL) 50000 UNITS CAPS capsule Take 50,000 Units by mouth every Thursday.        Results for orders placed during the hospital encounter of 04/21/13 (from the past 48 hour(s))  GLUCOSE, CAPILLARY     Status: Abnormal   Collection Time    04/23/13  4:40 PM      Result Value Ref Range   Glucose-Capillary 152 (*) 70 - 99 mg/dL   Comment 1 Documented in Chart     Comment 2 Notify RN    CBC     Status: Abnormal   Collection Time    04/23/13  5:18 PM      Result Value Ref Range   WBC 10.3  4.0 - 10.5 K/uL   RBC 3.58 (*) 4.22 - 5.81 MIL/uL   Hemoglobin 8.5 (*) 13.0 - 17.0 g/dL   HCT 26.7 (*) 39.0 - 52.0 %   MCV 74.6 (*) 78.0 - 100.0 fL   MCH 23.7 (*) 26.0 - 34.0 pg   MCHC 31.8  30.0 - 36.0 g/dL   RDW 20.8 (*) 11.5 - 15.5 %   Platelets 275  150 - 400 K/uL  SODIUM, URINE, RANDOM     Status: None   Collection Time    04/23/13  9:53 PM      Result Value Ref Range   Sodium, Ur  58    CREATININE, URINE, RANDOM     Status: None   Collection Time    04/23/13  9:53 PM      Result Value Ref Range    Creatinine, Urine 56.84    UREA NITROGEN, URINE     Status: None   Collection Time    04/23/13  9:53 PM      Result Value Ref Range   Urea Nitrogen, Ur 434     Comment: (NOTE)       No established reference range for random urine.     Performed at [facility], CAPILLARY     Status: Abnormal   Collection Time    04/23/13 10:00 PM      Result Value Ref Range   Glucose-Capillary 173 (*) 70 - 99 mg/dL   Comment 1 Notify RN    CBC     Status: Abnormal   Collection Time    04/24/13  5:27 AM      Result Value Ref Range   WBC 11.7 (*) 4.0 - 10.5 K/uL   RBC 3.39 (*) 4.22 - 5.81 MIL/uL   Hemoglobin 7.9 (*) 13.0 - 17.0 g/dL   HCT 25.7 (*) 39.0 - 52.0 %   MCV 75.8 (*) 78.0 - 100.0 fL   MCH 23.3 (*) 26.0 - 34.0 pg   MCHC 30.7  30.0 - 36.0 g/dL   RDW 21.5 (*) 11.5 - 15.5 %   Platelets 265  150 - 400 K/uL  GLUCOSE, CAPILLARY     Status: Abnormal   Collection Time    04/24/13  6:07 AM      Result Value Ref Range   Glucose-Capillary 131 (*) 70 - 99 mg/dL  GLUCOSE, CAPILLARY     Status: Abnormal   Collection Time    04/24/13 11:13 AM      Result Value Ref Range   Glucose-Capillary 168 (*) 70 - 99 mg/dL   Comment 1 Notify RN    BASIC METABOLIC PANEL     Status: Abnormal   Collection Time    04/24/13  1:41 PM      Result Value Ref Range   Sodium 141  137 - 147 mEq/L   Potassium 3.7  3.7 - 5.3 mEq/L   Chloride 93 (*) 96 - 112 mEq/L   CO2 24  19 - 32 mEq/L   Glucose, Bld 138 (*) 70 - 99 mg/dL   BUN 48 (*) 6 - 23 mg/dL   Creatinine, Ser 2.22 (*) 0.50 - 1.35 mg/dL   Calcium 6.7 (*) 8.4 - 10.5 mg/dL   GFR calc non Af Amer 30 (*) >90 mL/min   GFR calc Af Amer 35 (*) >90 mL/min   Comment: (NOTE)     The eGFR has been calculated using the CKD EPI equation.     This calculation has not been validated in all clinical situations.     eGFR's persistently <90 mL/min signify possible Chronic Kidney     Disease.  GLUCOSE, CAPILLARY     Status: Abnormal   Collection Time     04/24/13  4:01 PM      Result Value Ref Range   Glucose-Capillary 186 (*) 70 - 99 mg/dL  CBC     Status: Abnormal   Collection Time    04/24/13  5:26 PM      Result Value Ref Range   WBC 11.1 (*) 4.0 - 10.5 K/uL   RBC 3.15 (*) 4.22 - 5.81  MIL/uL   Hemoglobin 7.4 (*) 13.0 - 17.0 g/dL   HCT 23.6 (*) 39.0 - 52.0 %   MCV 74.9 (*) 78.0 - 100.0 fL   MCH 23.5 (*) 26.0 - 34.0 pg   MCHC 31.4  30.0 - 36.0 g/dL   RDW 21.4 (*) 11.5 - 15.5 %   Platelets 289  150 - 400 K/uL  GLUCOSE, CAPILLARY     Status: Abnormal   Collection Time    04/24/13  9:33 PM      Result Value Ref Range   Glucose-Capillary 144 (*) 70 - 99 mg/dL  CBC     Status: Abnormal   Collection Time    04/25/13  4:25 AM      Result Value Ref Range   WBC 12.3 (*) 4.0 - 10.5 K/uL   Comment: WHITE COUNT CONFIRMED ON SMEAR     REPEATED TO VERIFY   RBC 3.26 (*) 4.22 - 5.81 MIL/uL   Hemoglobin 7.7 (*) 13.0 - 17.0 g/dL   HCT 24.5 (*) 39.0 - 52.0 %   MCV 75.2 (*) 78.0 - 100.0 fL   MCH 23.6 (*) 26.0 - 34.0 pg   MCHC 31.4  30.0 - 36.0 g/dL   RDW 22.0 (*) 11.5 - 15.5 %   Platelets    150 - 400 K/uL   Value: PLATELET CLUMPS NOTED ON SMEAR, COUNT APPEARS ADEQUATE  RENAL FUNCTION PANEL     Status: Abnormal   Collection Time    04/25/13  4:25 AM      Result Value Ref Range   Sodium 142  137 - 147 mEq/L   Potassium 3.4 (*) 3.7 - 5.3 mEq/L   Chloride 94 (*) 96 - 112 mEq/L   CO2 25  19 - 32 mEq/L   Glucose, Bld 90  70 - 99 mg/dL   BUN 46 (*) 6 - 23 mg/dL   Creatinine, Ser 2.19 (*) 0.50 - 1.35 mg/dL   Calcium 6.6 (*) 8.4 - 10.5 mg/dL   Phosphorus 5.5 (*) 2.3 - 4.6 mg/dL   Albumin 1.8 (*) 3.5 - 5.2 g/dL   GFR calc non Af Amer 31 (*) >90 mL/min   GFR calc Af Amer 36 (*) >90 mL/min   Comment: (NOTE)     The eGFR has been calculated using the CKD EPI equation.     This calculation has not been validated in all clinical situations.     eGFR's persistently <90 mL/min signify possible Chronic Kidney     Disease.  GLUCOSE, CAPILLARY      Status: None   Collection Time    04/25/13  5:35 AM      Result Value Ref Range   Glucose-Capillary 89  70 - 99 mg/dL  GLUCOSE, CAPILLARY     Status: Abnormal   Collection Time    04/25/13 11:57 AM      Result Value Ref Range   Glucose-Capillary 101 (*) 70 - 99 mg/dL   Comment 1 Notify RN     Dg Chest 2 View  04/24/2013   CLINICAL DATA:  Dyspnea  EXAM: CHEST  2 VIEW  COMPARISON:  Prior radiograph from 04/22/2013  FINDINGS: Cardiomegaly is stable as compared to the prior exam.  Lungs are mildly hypoinflated. There is diffuse pulmonary vascular congestion with indistinctness of the interstitial markings, compatible with pulmonary edema, worsened. Small bilateral pleural effusions are present. More confluent bilateral perihilar opacities likely reflect alveolar edema. No definite focal infiltrates identified. No pneumothorax.  Surgical clips overlie  the left axilla and lower neck. No acute osseous abnormality.  IMPRESSION: Cardiomegaly with small bilateral pleural effusions and moderate diffuse pulmonary edema, worsened as compared to 04/22/13.   Electronically Signed   By: Jeannine Boga M.D.   On: 04/24/2013 03:12    Review of Systems  Constitutional: Negative for fever.  Respiratory: Negative for cough and shortness of breath.   Cardiovascular: Negative for chest pain.  Gastrointestinal: Negative for vomiting, abdominal pain and diarrhea.  Musculoskeletal: Positive for back pain.  Neurological: Positive for weakness. Negative for dizziness.    Blood pressure 145/64, pulse 90, temperature 98 F (36.7 C), temperature source Oral, resp. rate 18, height $RemoveBe'5\' 11"'DDePuBGEi$  (1.803 m), weight 82.9 kg (182 lb 12.2 oz), SpO2 98.00%. Physical Exam  Constitutional: He is oriented to person, place, and time. He appears well-developed.  Cardiovascular: Normal rate.   Murmur heard. Respiratory: Effort normal. He has wheezes.  Very few wheezes throughout  GI: Soft. There is no tenderness.   Musculoskeletal: Normal range of motion.  Neurological: He is alert and oriented to person, place, and time.  Skin: Skin is warm and dry.  Noted sacral ulcer: 2x2 cm; 5 mm deep No sign of infection; no redness; no purulence  Psychiatric: He has a normal mood and affect. His behavior is normal. Judgment and thought content normal.     Assessment/Plan Pt admitted 2/5 for CHF symptoms Sxs resolving; stable; diuresis ongoing Has hx Prostate Ca; + severe back pain Previous MRI reveals T10 lesion Being followed by Dr Diona Fanti --he discussed with Dr Vernard Gambles possible T10 bx and KP Scheduled now for T10 biopsy and Kyphoplasty 2/18 Pt aware of procedure benefits and risks and agreeable to proceed Consent signed and in chart  Lourene Hoston A 04/25/2013, 2:33 PM

## 2013-04-25 NOTE — Progress Notes (Signed)
UR completed Kyara Boxer K. Bethanie Bloxom, RN, BSN, [place], CCM  04/25/2013 8:37 PM

## 2013-04-25 NOTE — Care Management Note (Unsigned)
    Page 1 of 2   05/05/2013     2:39:51 PM   CARE MANAGEMENT NOTE 05/05/2013  Patient:  Joshua [name],Joshua [name]   Account Number:  [id number]  Date Initiated:  04/25/2013  Documentation initiated by:  HUTCHINSON,CRYSTAL  Subjective/Objective Assessment:   admitted with ESRD - s/p   transplant swelling     Action/Plan:   will follow for disposition needs   Anticipated DC Date:  05/05/2013   Anticipated DC Plan:  [facility] (LTAC)  In-house referral  Clinical Social Worker      DC Planning Services  CM consult      Choice offered to / List presented to:  NA           Status of service:  In process, will continue to follow Medicare Important Message given?   (If response is "NO", the following Medicare IM given date fields will be blank) Date Medicare IM given:   Date Additional Medicare IM given:    Discharge Disposition:    Per UR Regulation:  Reviewed for med. necessity/level of care/duration of stay  If discussed at [place] of Stay Meetings, dates discussed:   05/04/2013    Comments:  05/05/13 Hillari Zumwalt,RN,BSN [phone] NO BED AVAILABLE AT [place]. MET WITH PT AND WIFE TO READRESS [facility] AS OPTION.  THEY ARE NOT WILLING TO CONSIDER [facility] AS DISPOSITION.  [facility] BED AVAILABLE OVER THE WEEKEND; IF SO, PT CAN TRANSFER.  DISCUSSED WITH ATTENDING MDS.  WILL UPDATE WEEKEND CASE MANAGER WITH THIS INFORMATION.  05/04/13 Quay Simkin,RN,BSN [phone] PT AGREEABLE TO LTAC, AND REQUESTS [facility].  BED NOT AVAILABLE TODAY, PER [facility], BUT LIKELY TOMORROW. WILL FOLLOW UP WITH [facility] IN AM TO DISCUSS BED AVAILABILITY.  UPDATED ATTENDING MD.  05-01-2013 11:50am Luz Lex, RNBSN [phone] Discussed ltach option with rounding physician group. Agree that this may be a good option.  Ltach order placed to check benefits and to see if meets criteria.  CM will continue to follow.  04/28/2013 Hx/o ESRD sp transplant 13  years ago, Pulmonary Edema, volume excess Hx/o readmission x 2 > 6 months Social:  From home with PCG/Wife Home DME:  [facility], home oxygen Hx/o readmission x 2 > 6 months Active with HHS:  [facility] for RN, PT, OT services. (To be resumed at d/c) - However, PT RECS:  SNF Chest pain episode 04/27/2013:  Transferred to 2 H on 04/27/2013 Disposition Plan:  SNF  (SW referral sent to [facility] covering for [place] on 04/27/2013 Crystal Hutchinson RN, BSN, [facility], CCM 04/28/2013   Social:  From home with PCG/Wife Home DME:  [facility], home oxygen Hx/o readmission x 2 > 6 months IV Lasix. Active with HHS:  [facility] for RN, PT, OT services. (To be resumed at d/c) ADD:  +2-3 Mariann Laster RN, BSN, [place], CCM 04/25/2013

## 2013-04-26 ENCOUNTER — Inpatient Hospital Stay (HOSPITAL_COMMUNITY): Payer: Medicare Other

## 2013-04-26 DIAGNOSIS — I2510 Atherosclerotic heart disease of native coronary artery without angina pectoris: Secondary | ICD-10-CM

## 2013-04-26 LAB — GLUCOSE, CAPILLARY
GLUCOSE-CAPILLARY: 125 mg/dL — AB (ref 70–99)
Glucose-Capillary: 122 mg/dL — ABNORMAL HIGH (ref 70–99)
Glucose-Capillary: 125 mg/dL — ABNORMAL HIGH (ref 70–99)
Glucose-Capillary: 177 mg/dL — ABNORMAL HIGH (ref 70–99)

## 2013-04-26 LAB — CBC
HCT: 25.3 % — ABNORMAL LOW (ref 39.0–52.0)
HEMATOCRIT: 27 % — AB (ref 39.0–52.0)
HEMOGLOBIN: 7.7 g/dL — AB (ref 13.0–17.0)
Hemoglobin: 8.1 g/dL — ABNORMAL LOW (ref 13.0–17.0)
MCH: 23.1 pg — ABNORMAL LOW (ref 26.0–34.0)
MCH: 22.8 pg — ABNORMAL LOW (ref 26.0–34.0)
MCHC: 30.4 g/dL (ref 30.0–36.0)
MCHC: 30 g/dL (ref 30.0–36.0)
MCV: 75.7 fL — ABNORMAL LOW (ref 78.0–100.0)
MCV: 76.1 fL — AB (ref 78.0–100.0)
PLATELETS: 236 10*3/uL (ref 150–400)
Platelets: 286 10*3/uL (ref 150–400)
RBC: 3.34 MIL/uL — ABNORMAL LOW (ref 4.22–5.81)
RBC: 3.55 MIL/uL — AB (ref 4.22–5.81)
RDW: 21.4 % — ABNORMAL HIGH (ref 11.5–15.5)
RDW: 21.5 % — ABNORMAL HIGH (ref 11.5–15.5)
WBC: 11.4 10*3/uL — ABNORMAL HIGH (ref 4.0–10.5)
WBC: 12.3 10*3/uL — AB (ref 4.0–10.5)

## 2013-04-26 LAB — RENAL FUNCTION PANEL
Albumin: 1.6 g/dL — ABNORMAL LOW (ref 3.5–5.2)
BUN: 46 mg/dL — AB (ref 6–23)
CHLORIDE: 92 meq/L — AB (ref 96–112)
CO2: 25 meq/L (ref 19–32)
Calcium: 6.5 mg/dL — ABNORMAL LOW (ref 8.4–10.5)
Creatinine, Ser: 2.25 mg/dL — ABNORMAL HIGH (ref 0.50–1.35)
GFR calc Af Amer: 34 mL/min — ABNORMAL LOW (ref >90)
GFR calc non Af Amer: 30 mL/min — ABNORMAL LOW (ref >90)
GLUCOSE: 105 mg/dL — AB (ref 70–99)
POTASSIUM: 3.8 meq/L (ref 3.7–5.3)
Phosphorus: 5.7 mg/dL — ABNORMAL HIGH (ref 2.3–4.6)
Sodium: 139 mEq/L (ref 137–147)

## 2013-04-26 LAB — SIROLIMUS LEVEL: SIROLIMUS (RAPAMYCIN): 13.2 ug/L (ref 3.0–18.0)

## 2013-04-26 LAB — PROTIME-INR
INR: 1.38 (ref 0.00–1.49)
Prothrombin Time: 16.6 seconds — ABNORMAL HIGH (ref 11.6–15.2)

## 2013-04-26 MED ORDER — HYDRALAZINE HCL 10 MG PO TABS
10.0000 mg | ORAL_TABLET | Freq: Three times a day (TID) | ORAL | Status: DC
  Administered 2013-04-26 – 2013-04-27 (×3): 10 mg via ORAL
  Filled 2013-04-26 (×6): qty 1

## 2013-04-26 MED ORDER — MIDAZOLAM HCL 2 MG/2ML IJ SOLN
INTRAMUSCULAR | Status: AC
  Filled 2013-04-26: qty 8

## 2013-04-26 MED ORDER — SODIUM CHLORIDE 0.9 % IV SOLN
2.0000 g | Freq: Once | INTRAVENOUS | Status: AC
  Administered 2013-04-26: 2 g via INTRAVENOUS
  Filled 2013-04-26: qty 20

## 2013-04-26 MED ORDER — IOHEXOL 300 MG/ML  SOLN
50.0000 mL | Freq: Once | INTRAMUSCULAR | Status: AC | PRN
  Administered 2013-04-26: 5 mL

## 2013-04-26 MED ORDER — HYDROMORPHONE HCL PF 1 MG/ML IJ SOLN
INTRAMUSCULAR | Status: AC
  Filled 2013-04-26: qty 2

## 2013-04-26 MED ORDER — COLLAGENASE 250 UNIT/GM EX OINT
TOPICAL_OINTMENT | Freq: Every day | CUTANEOUS | Status: DC
  Administered 2013-04-26: 19:00:00 via TOPICAL
  Administered 2013-04-27: 1 via TOPICAL
  Administered 2013-04-27: 10:00:00 via TOPICAL
  Administered 2013-04-29: 1 via TOPICAL
  Administered 2013-04-30: 06:00:00 via TOPICAL
  Administered 2013-05-01: 1 via TOPICAL
  Administered 2013-05-02 – 2013-05-03 (×2): via TOPICAL
  Administered 2013-05-04 – 2013-05-05 (×2): 1 via TOPICAL
  Administered 2013-05-06 – 2013-05-15 (×9): via TOPICAL
  Filled 2013-04-26 (×2): qty 30

## 2013-04-26 MED ORDER — BIOTENE DRY MOUTH MT LIQD
15.0000 mL | Freq: Two times a day (BID) | OROMUCOSAL | Status: DC
  Administered 2013-04-26 – 2013-05-14 (×33): 15 mL via OROMUCOSAL

## 2013-04-26 MED ORDER — HYDROMORPHONE HCL PF 1 MG/ML IJ SOLN
INTRAMUSCULAR | Status: AC | PRN
  Administered 2013-04-26: 0.5 mg
  Administered 2013-04-26 (×2): 1 mg
  Administered 2013-04-26: 0.5 mg
  Administered 2013-04-26: 1 mg

## 2013-04-26 MED ORDER — MIDAZOLAM HCL 2 MG/2ML IJ SOLN
INTRAMUSCULAR | Status: AC | PRN
  Administered 2013-04-26 (×2): 1 mg via INTRAVENOUS
  Administered 2013-04-26: 2 mg via INTRAVENOUS
  Administered 2013-04-26 (×2): 1 mg via INTRAVENOUS

## 2013-04-26 MED ORDER — HYDROMORPHONE HCL PF 1 MG/ML IJ SOLN
INTRAMUSCULAR | Status: AC
  Filled 2013-04-26: qty 3

## 2013-04-26 NOTE — Progress Notes (Signed)
Internal Medicine Attending  Date: 04/26/2013  Patient name: Joshua [name] Medical record number: [id number] Date of birth: 01/24/1951 Age: 63 y.o. Gender: male  I saw and evaluated the patient. I reviewed the resident's note by Dr. Redmond Pulling and I agree with the resident's findings and plans as documented in her progress note.  Joshua [name] underwent T10 biopsy and kyphoplasty today.  We continue to diurese him given his volume overload status and have adjusted his heart failure medications to include those that confer a survival benefit.  We will continue with the afterload reduction and diuresis as we try to get him to "dry weight".

## 2013-04-26 NOTE — ED Notes (Signed)
Pt complains of LBP.  He is unable to rate severity at this time.  Only, "that it hurts pretty bad".

## 2013-04-26 NOTE — Progress Notes (Signed)
Patient: Joshua [name] / Admit Date: 04/21/2013 / Date of Encounter: 04/26/2013, 9:23 AM   Subjective  Complex patient. See below for interim hx. Denies SOB or CP. Mild back discomfort. Per nursing note, overnight was restless and anxious with some in and out of confused conversation and talking to imaginary visitor on his room. He is completely A/Ox3 at present.  Objective   Telemetry: not on tele  Physical Exam: Blood pressure 142/69, pulse 83, temperature 98.2 F (36.8 C), temperature source Oral, resp. rate 18, height 5\' 11"  (1.803 m), weight 182 lb 6.4 oz (82.736 kg), SpO2 93.00%. General: Chronically ill appearing AAM in no acute distress. Head: Normocephalic, atraumatic, sclera non-icteric, no xanthomas, nares are without discharge. Arcus senilis Neck: JVP mildly elevated. Lungs: Diminished at bases, otherwise no wheezes, rales, or rhonchi. Breathing is unlabored. Heart: RRR S1 S2 with seft SEM. No rubs or gallops.  Abdomen: Soft, non-tender, non-distended with normoactive bowel sounds. No rebound/guarding. Extremities: No clubbing or cyanosis. No edema. Distal pedal pulses are 2+ and equal bilaterally. Neuro: Alert and oriented X 3. Moves all extremities spontaneously. Spontaneous quick involuntary appearing movements Psych:  Responds to questions appropriately with a normal affect.   Intake/Output Summary (Last 24 hours) at 04/26/13 0923 Last data filed at 04/26/13 0844  Gross per 24 hour  Intake    450 ml  Output   1150 ml  Net   -700 ml    Inpatient Medications:  . atorvastatin  80 mg Oral q1800  .  ceFAZolin (ANCEF) IV  2 g Intravenous Once  . diltiazem  120 mg Oral q morning - 10a  . feeding supplement (GLUCERNA SHAKE)  237 mL Oral BID BM  . furosemide  80 mg Intravenous Q12H  . insulin aspart  0-9 Units Subcutaneous TID WC  . insulin glargine  15 Units Subcutaneous QHS  . isosorbide mononitrate  60 mg Oral q morning - 10a  . levothyroxine  150 mcg Oral QAC  breakfast  . metoprolol  100 mg Oral BID  . mycophenolate  720 mg Oral BID  . pantoprazole  40 mg Oral Daily  . sirolimus  2 mg Oral q morning - 10a  . sodium chloride  3 mL Intravenous Q12H   Infusions:    Labs:  Recent Labs  04/24/13 1341 04/25/13 0425  NA 141 142  K 3.7 3.4*  CL 93* 94*  CO2 24 25  GLUCOSE 138* 90  BUN 48* 46*  CREATININE 2.22* 2.19*  CALCIUM 6.7* 6.6*  PHOS  --  5.5*    Recent Labs  04/25/13 0425  ALBUMIN 1.8*    Recent Labs  04/25/13 2230 04/26/13 0414  WBC 13.3* 11.4*  HGB 7.2* 7.7*  HCT 23.7* 25.3*  MCV 75.2* 75.7*  PLT 289 236     Radiology/Studies:  Dg Chest 2 View  04/24/2013   CLINICAL DATA:  Dyspnea  EXAM: CHEST  2 VIEW  COMPARISON:  Prior radiograph from 04/22/2013  FINDINGS: Cardiomegaly is stable as compared to the prior exam.  Lungs are mildly hypoinflated. There is diffuse pulmonary vascular congestion with indistinctness of the interstitial markings, compatible with pulmonary edema, worsened. Small bilateral pleural effusions are present. More confluent bilateral perihilar opacities likely reflect alveolar edema. No definite focal infiltrates identified. No pneumothorax.  Surgical clips overlie the left axilla and lower neck. No acute osseous abnormality.  IMPRESSION: Cardiomegaly with small bilateral pleural effusions and moderate diffuse pulmonary edema, worsened as compared to 04/22/13.   Electronically Signed  By: Jeannine Boga M.D.   On: 04/24/2013 03:12   Dg Chest 2 View  04/22/2013   CLINICAL DATA:  Shortness of breath and weakness  EXAM: CHEST  2 VIEW  COMPARISON:  04/21/2013  FINDINGS: Clips project over the neck from presumed thyroidectomy. Interstitial edema, trace effusions, and curvilinear bilateral lower lobe presumed atelectasis are again noted. There has been interval increase in hazy predominantly dependent bilateral lower lobe airspace opacity. Cardiomegaly persists. Left axillary clips are noted.   IMPRESSION: Slight increase in bibasilar opacities likely progressive alveolar superimposed on interstitial pulmonary edema.   Electronically Signed   By: Conchita Paris M.D.   On: 04/22/2013 16:32   Dg Chest 2 View  04/21/2013   CLINICAL DATA:  Leg swelling, history CHF, hypertension, diabetes, stroke  EXAM: CHEST  2 VIEW  COMPARISON:  04/19/2013  FINDINGS: Enlargement of cardiac silhouette with pulmonary vascular congestion.  Diffuse pulmonary infiltrates compatible pulmonary edema and CHF.  Scattered areas of bibasilar atelectasis.  More focal opacity or left lung base may represent atelectasis or increased edema but coexistent infection not excluded.  Small bibasilar effusions.  No pneumothorax.  Vascular stents in left upper arm.  Retained contrast in colon.  IMPRESSION: Diffuse pulmonary infiltrates likely representing pulmonary edema/ CHF with scattered areas of atelectasis and small bilateral pleural effusions as above.   Electronically Signed   By: Lavonia Dana M.D.   On: 04/21/2013 14:40   Dg Chest 2 View  04/19/2013   CLINICAL DATA:  Atrophic relation and tachycardia  FINDINGS: There is pulmonary edema. There are bilateral pleural effusions. There is consolidation of bilateral lung bases. The heart size is enlarged. The aorta is tortuous. Surgical clips are identified in the lower neck and upper mediastinum an bilateral axilla.  IMPRESSION: Congestive heart failure. Small bilateral pleural effusions. Patchy consolidation of both lung bases, superimposed pneumonia not excluded.   Electronically Signed   By: Abelardo Diesel M.D.   On: 04/19/2013 01:57   Dg Chest 2 View  03/31/2013   CLINICAL DATA:  Cough and fever.  Follow-up pulmonary infiltrates.  EXAM: CHEST  2 VIEW  COMPARISON:  03/28/2013  FINDINGS: The cardiac silhouette remains enlarged, unchanged. Patchy bilateral airspace opacities are again seen, greatest in the upper lobes. Left lower lobe opacity has slightly increased from the prior  study. Opacities elsewhere appear stable to minimally improved. Small bilateral pleural effusions are unchanged. No pneumothorax is identified. Surgical clips are again seen in the lower neck and axillae. No acute osseous abnormality is seen.  IMPRESSION: Patchy bilateral airspace opacities concerning for pneumonia, stable to minimally improved in the upper lobes and slightly worsened in the left lower lobe. Unchanged effusions.   Electronically Signed   By: Logan Bores   On: 03/31/2013 08:51   Dg Chest 2 View  03/29/2013   CLINICAL DATA:  Shortness of breath and cough.  EXAM: CHEST  2 VIEW  COMPARISON:  Chest radiograph and CT of the chest performed 03/27/2013  FINDINGS: The lungs are well-aerated. Fluffy bilateral central airspace opacification is again noted bilaterally, perhaps slightly improved from the prior study at the right lung base. Small bilateral pleural effusions are noted. The appearance on CT is suspicious for multifocal pneumonia, though superimposed interstitial edema cannot be excluded. There is no evidence of pneumothorax.  The heart is borderline enlarged. No acute osseous abnormalities are seen. Postoperative change is seen about the thyroid bed. Clips are seen at the left axilla.  IMPRESSION: 1. Persistent fluffy  bilateral central airspace opacification, perhaps slightly improved at the right lung base. Small bilateral pleural effusions seen. The appearance on CT is suspicious for significant multifocal pneumonia, though superimposed interstitial edema cannot be excluded. 2. Borderline cardiomegaly.   Electronically Signed   By: Garald Balding M.D.   On: 03/29/2013 01:31   Dg Chest 2 View  03/27/2013   CLINICAL DATA:  Cough, hemoptysis  EXAM: CHEST  2 VIEW  COMPARISON:  03/23/2013  FINDINGS: Multifocal patchy airspace opacities, most prominent in the right perihilar region, mildly increased. Differential considerations include mild to moderate interstitial edema or possibly multifocal  infection.  Suspected small left pleural effusion.  No pneumothorax.  Cardiomegaly.  Surgical clips overlying the bilateral neck.  IMPRESSION: Multifocal patchy airspace opacities, most prominent in the right perihilar region, mildly increased. Differential considerations include mild to moderate interstitial edema or possibly multifocal infection.  Cardiomegaly with suspected small left pleural effusion.   Electronically Signed   By: Julian Hy M.D.   On: 03/27/2013 13:00   Ct Chest Wo Contrast  03/27/2013   CLINICAL DATA:  Chest pain and shortness of breath for several months. Low-grade fever. Acute renal failure in transplant patient.  EXAM: CT CHEST WITHOUT CONTRAST  TECHNIQUE: Multidetector CT imaging of the chest was performed following the standard protocol without IV contrast.  COMPARISON:  Chest radiograph performed earlier today at 11:45 a.m.  FINDINGS: Diffuse fluffy bilateral central airspace opacity is seen, most prominent at the upper lung lobes bilaterally. This is most compatible with diffuse pneumonia. Underlying edema cannot be excluded, given underlying small bilateral pleural effusions and interstitial prominence. No pneumothorax is seen.  Scattered coronary artery calcifications are seen. The heart is mildly enlarged. A mildly prominent right paratracheal node is seen, measuring 1.3 cm in short axis. No pericardial effusion is identified. The great vessels are grossly unremarkable in appearance. The patient is status post thyroidectomy. No axillary lymphadenopathy is seen.  The visualized portions of the liver and the spleen are unremarkable in appearance. Severe bilateral renal atrophy is seen, with scattered calcifications noted bilaterally. A small hiatal hernia is noted.  IMPRESSION: 1. Diffuse fluffy bilateral central airspace opacification, most prominent at the upper lung lobes. This is most compatible with diffuse multifocal pneumonia. Underlying edema cannot be excluded, given  small bilateral pleural effusions and interstitial prominence. 2. Scattered coronary calcifications seen. 3. Mild cardiomegaly. 4. Mildly prominent right paratracheal node, measuring 1.3 cm in short axis. 5. Severe bilateral renal atrophy, with scattered associated calcifications. 6. Small hiatal hernia seen.   Electronically Signed   By: Garald Balding M.D.   On: 03/27/2013 21:48   Dg Colon Alonza Bogus Hi Den Cm  04/04/2013   CLINICAL DATA:  Incomplete colonoscopy.  Rectal bleeding.  Anemia.  EXAM: AIR CONTRAST BARIUM ENEMA  TECHNIQUE: Initial scout AP supine abdominal image obtained to insure adequate colon cleansing. Barium was introduced into the colon in a retrograde fashion and refluxed from the rectum to the distal transverse colon. As much of the barium as possible was then removed through the indwelling tube via gravity drain. Air was then insufflated into the colon. Spot images of the colon followed by overhead radiographs were obtained.  COMPARISON:  No priors.  FLUOROSCOPY TIME:  6 min and 58 seconds  FINDINGS: Initial scout view of the abdomen demonstrates diffuse gaseous distention throughout the colon. Several nondilated loops of gas-filled small bowel are noted. Multiple surgical clips are noted.  Filling of the entire colon with barium was  challenging in this patient secondary to extreme colonic redundancy. However, barium eventually reached the cecum. Contrast was then drained, and air insufflated. Double contrast images of the colon demonstrated extreme colonic redundancy. No definite colonic mass identified. On some images, small filling defects were noted within the descending colon and distal transverse colon, however, these were not confirmed on other views, favored to be transient related to residual fecal contents. No definite polyps were confidently identified.  IMPRESSION: 1. No definite colonic mass or other source for rectal bleeding confidently identified on today's limited examination. 2.  Marked colonic redundancy noted.   Electronically Signed   By: Vinnie Langton M.D.   On: 04/04/2013 18:08     Assessment and Plan  Mr. Benthall is a 63 year old male with recently complex PMH. He has h/o CAD s/p prior stenting, remote renal transplant on immunosuppressants, dCHF, mild AS, DM, HTN, possible metastatic prostate CA. He was admitted for prolonged stay 03/2013 with CP and abnormal nuc treated with medical therapy. VQ was neg. EF 40-45% by echo during that admission, 34% by nuc. During that admission he had interval development of paroxysmal atrial tach but developed ARF/CKD in setting of possible sirolimus toxicity with comcomitant diltiazem; Multaq used but later stopped due to worsening renal function and CHF. He also had hyperkalemia tx with kayexalate, limited NSVT, fever and presumed HCAP, hemoptysis, and low volume hematochezia and severe anemia with Hgb down to 5.7 requiring transfusion. Underwent EGD - wnl, though colonoscopy incomplete, so received barium enema which did not should mass or source of bleeding. Plavix was stopped that admission due to bleeding/severe anemia (also P2Y12 was 365 indicating Plavix nonresponder, h/o CVA so not Effient candidate in the future). Per notes with regard to back pain, he was recently dx with T10 lesion (?mets) per an MRI not in Epic although bone scan 02/2013 was unremarkable. Pt was discharged and returned 2/13 with CHF, anasarca, and a/c renal failure. There was concern for worsening EF on echo 30-35% although this is felt to be in line with EF by nuc. He is being evaluated by IR for planned T10 biopsy and kyphoplasty during this admission.  1. Acute on chronic systolic and diastolic CHF 2. Acute on chronic renal failure with history of renal transplant on immunosuppressant therapy (Cr back in 2013, early Jan was in 1.7 range) 3. Prostate CA with possible T10 mets/back pain, for planned bx and kyphoplasty 4. Microcytic anemia with recent  hemoptysis and hematochezia of unclear source, not currently on antiplatelets due to this 5. CAD s/p remote stenting (BMS to LAD and chronically occluded RCA), recently abnormal nuclear stress test 03/2013 6. Significant hypoalbuminemia 1.6-1.8 7. Abnormal movements ? defer to IM  See consult note yesterday. Dr. Haroldine Laws reviewed his stress test and although this was suboptimal study, he doubted ischemic burden was high enough to give him ischemically mediated CHF at rest. He suspects major issue is persistent volume overload. The patient is not a candidate for cath due to renal failure and recent GIB/anemia and not likely ICD candidate with comorbidities and poor functional status. Low albumin state may also be contributing to volume overload. Consider d/c diltiazem and titrating metoprolol due to CHF. (On metoprolol for improved rate control over Coreg given PAT.) Will place on tele. Lasix increased yesterday to 80mg  IV BID and SOB improved. BMET pending for today. Weight is at baseline weight - not sure what our endpoint with diuresis is here - MD to follow.  Signed, Melina Copa PA-C  I have seen and examined the patient along with Melina Copa PA-C.  I have reviewed the chart, notes and new data.  I agree with PA's note.  Key new complaints: he was reportedly hallucinating earlier; he is able to hold a coherent conversation with me now and appears fully oriented, albeit a little "twitchy" (hypocalcemia?) Key examination changes: Lying fully supine without dyspnea; JVP still elevated, but no edema of his extremities; abdomen mildly distended Key new findings / data: Ca low at 6.5 (confirmed low ionized calcium when Ca was 6.1 a few days ago)  PLAN: As outlined in  Dr. Clayborne Dana note yesterday, he has known CAD and ischemia assessment has been incomplete, but he is not a good candidate for cath/PCI for several reasons. Better diuresis yesterday, without worsening of renal function. Will stop  negative inotrope diltiazem, which also has many drug interactions. Add hydralazine to nitrates for CHF.Sanda Klein, MD, [facility] [phone] 04/26/2013, 12:57 PM

## 2013-04-26 NOTE — Progress Notes (Signed)
Subjective: Joshua [name] was seen and examined this afternoon.  His wife and another family member are present.  He says he is breathing well and his appetite is good.    Objective: Vital signs in last 24 hours: Filed Vitals:   04/25/13 2047 04/25/13 2114 04/26/13 0616 04/26/13 0933  BP: 147/65  142/69 144/54  Pulse: 85  83 83  Temp: 99.1 F (37.3 C)  98.2 F (36.8 C)   TempSrc: Oral  Oral   Resp: 20  18 18   Height:      Weight:   82.736 kg (182 lb 6.4 oz)   SpO2: 95% 95% 93% 100%   Weight change: -0.164 kg (-5.8 oz)  Intake/Output Summary (Last 24 hours) at 04/26/13 1139 Last data filed at 04/26/13 0844  Gross per 24 hour  Intake    340 ml  Output   1150 ml  Net   -810 ml   General: Lying in bed, NAD; tremulous Cardiac: RRR, no rubs, murmurs or gallops Pulm: CTA B/L, no crackles appreciated Abd: soft, non-distended, BS present Ext: warm and well perfused, lower extremity edema resolved Neuro: alert and oriented X3  Lab Results: Basic Metabolic Panel:  Recent Labs Lab 04/25/13 0425 04/26/13 0900  NA 142 139  K 3.4* 3.8  CL 94* 92*  CO2 25 25  GLUCOSE 90 105*  BUN 46* 46*  CREATININE 2.19* 2.25*  CALCIUM 6.6* 6.5*  PHOS 5.5* 5.7*   Liver Function Tests:  Recent Labs Lab 04/21/13 1330 04/22/13 0156 04/25/13 0425 04/26/13 0900  AST 38* 38*  --   --   ALT 16 16  --   --   ALKPHOS 163* 175*  --   --   BILITOT 0.4 0.4  --   --   PROT 7.0 6.5  --   --   ALBUMIN 1.8* 1.6* 1.8* 1.6*   CBC:  Recent Labs Lab 04/21/13 1330  04/23/13 0400  04/25/13 2230 04/26/13 0414  WBC 13.3*  < > 10.2  < > 13.3* 11.4*  NEUTROABS 10.8*  --  8.0*  --   --   --   HGB 7.1*  < > 7.6*  < > 7.2* 7.7*  HCT 22.9*  < > 24.5*  < > 23.7* 25.3*  MCV 74.4*  < > 74.2*  < > 75.2* 75.7*  PLT 364  < > 302  < > 289 236  < > = values in this interval not displayed.  CBG:  Recent Labs Lab 04/25/13 0535 04/25/13 1157 04/25/13 1614 04/25/13 2051 04/26/13 0601  04/26/13 1113  GLUCAP 89 101* 143* 94 122* 125*   Medications: I have reviewed the patient's current medications. Scheduled Meds: . antiseptic oral rinse  15 mL Mouth Rinse BID  . atorvastatin  80 mg Oral q1800  . collagenase   Topical Daily  . feeding supplement (GLUCERNA SHAKE)  237 mL Oral BID BM  . furosemide  80 mg Intravenous Q12H  . hydrALAZINE  10 mg Oral 3 times per day  . HYDROmorphone      . HYDROmorphone      . insulin aspart  0-9 Units Subcutaneous TID WC  . insulin glargine  15 Units Subcutaneous QHS  . isosorbide mononitrate  60 mg Oral q morning - 10a  . levothyroxine  150 mcg Oral QAC breakfast  . metoprolol  100 mg Oral BID  . midazolam      . mycophenolate  720 mg Oral BID  . pantoprazole  40 mg Oral Daily  . sirolimus  2 mg Oral q morning - 10a  . sodium chloride  3 mL Intravenous Q12H   Continuous Infusions: none PRN Meds:.albuterol, diphenhydrAMINE, oxyCODONE  Assessment/Plan: 63 year old male with a PMH of HTN, DM type 2, CAD (s/p MI and LAD stent), kidney transplantation, carotid artery stenosis (s/p CEA 08/2011), PVD (s/p fem-pop in 1980), dHF (EF 45-50% Jan 2015), hx of papillary thyroid cancer and prostate cancer who presents with complaint of edema x 3 days and dyspnea for several weeks.   Volume overload:  Likely 2/2 worsening CHF in the setting of CKD about 13ys s/p renal transplant, on sirolimus and mycophenolate.  Symptoms improving with diuresis.  Down 4.8L and 6 pounds since admission.  Cardiology feels this is probably not ischemia mediated HF and that the major issue is volume overload.  Appreciate Cardiology and Nephrology following. 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 Baseline Cr 1.9-2.5. Creatine 2.24 on admisison.  Nephrology following. Continuing Sirolimus and Myfortic.  Sirolimus level  therapeutic - 13.2.  - Continue anti-rejection meds  T10 lesion noted on prior MRI:  concerning for mets from Prostate CA.  Followed by Dr. Diona Fanti (Urology).  - T10 bone biopsy and kyphoplasty today  Acute on chronic anemia: Stable.  Hgb 7.1 on admission and tx 1u pRBCs, responded to 8.1. Hgb 8.1 this morning. No active bleeding.  - q12 CBC  Hypocalcemia: 6.3 on admission and he received calcium gluconate.  Repeat on 12/15 was 6.1 w/ albumin 1.6. Corrected Ca was 8.0. However ionized Calcium was low at 0.71. Replacing Calcium.   DM type 2: Stable. Previously on 24 units Lantus at home, but wife reported hypoglycemic episodes, so she was administering 12 units over the past few days.  Pt started on Lantus 15u on admission w/ SSI. - continue Lantus 15 units qHS and SSI   HTN: Stable. Continued home medications on admission: Lopressor; diltiazem d/c.  H/o prostate cancer: Question of spinal mets at last hospitalization, reportedly seen on MRI, but negative bone scan 02/2013. Pt seen by Dr. Diona Fanti with Urology, and per his last hospital d/c, wanted the pt to be worked up as an outpatient.  - Continue Oxycodone IR 10mg  q 4h PRN - plan for T10 biopsy today  DVT ppx: SCDs  Dispo: Disposition is deferred at this time, awaiting improvement of current medical problems.  Anticipated discharge in approximately 1-2 day(s).   The patient does have a current PCP (Sueanne Margarita, MD) and may need an [facility] hospital follow-up appointment after discharge.  The patient does not know have transportation limitations that hinder transportation to clinic appointments.  .Services Needed at time of discharge: Y = Yes, Blank = No PT:   OT:   RN:   Equipment:   Other:     LOS: 5 days   Duwaine Maxin, DO 04/26/2013, 11:39 AM

## 2013-04-26 NOTE — Progress Notes (Signed)
Patient alert and oriented x4, but noted that patient seems to be hallucinating and talking to people who are not present.  Patient states that he feels "cloudy".  Daughter at bedside briefly this afternoon, and noted that patient "doesn't act like this at home".  MD aware.  Started oral care protocol on patient as he is currently NPO and has dry, cracked lips and dry tongue.  Patient states he has no questions or concerns at this time.  Will continue to monitor.

## 2013-04-26 NOTE — Progress Notes (Signed)
Patient ID: Joshua [name], male   DOB: March 04, 1951, 63 y.o.   MRN: [id number] S:no new complaints but had an accident while trying to use the urinal O:BP 144/54  Pulse 83  Temp(Src) 98.2 F (36.8 C) (Oral)  Resp 18  Ht 5\' 11"  (1.803 m)  Wt 82.736 kg (182 lb 6.4 oz)  BMI 25.45 kg/m2  SpO2 100%  Intake/Output Summary (Last 24 hours) at 04/26/13 1114 Last data filed at 04/26/13 0844  Gross per 24 hour  Intake    340 ml  Output   1150 ml  Net   -810 ml   Intake/Output: I/O last 3 completed shifts: In: 808 [P.O.:588; IV Piggyback:220] Out: 2250 [Urine:2250]  Intake/Output this shift:    Weight change: -0.164 kg (-5.8 oz) QIH:[id number] ill-appearing AAM  CVS:no rub Resp:bibasilar crackles FIE:PPIRJJ Ext:no edema, LUE AVF +T/B   Recent Labs Lab 04/21/13 1330 04/22/13 0156 04/22/13 0849 04/23/13 0400 04/24/13 1341 04/25/13 0425 04/26/13 0900  NA 138 139 138 141 141 142 139  K 4.5 4.5 4.4 3.7 3.7 3.4* 3.8  CL 93* 93* 92* 94* 93* 94* 92*  CO2 24 22 22 23 24 25 25   GLUCOSE 132* 179* 153* 141* 138* 90 105*  BUN 45* 47* 48* 52* 48* 46* 46*  CREATININE 2.24* 2.27* 2.32* 2.38* 2.22* 2.19* 2.25*  ALBUMIN 1.8* 1.6*  --   --   --  1.8* 1.6*  CALCIUM 6.1* 6.0* 6.1* 6.0* 6.7* 6.6* 6.5*  PHOS  --   --   --   --   --  5.5* 5.7*  AST 38* 38*  --   --   --   --   --   ALT 16 16  --   --   --   --   --    Liver Function Tests:  Recent Labs Lab 04/21/13 1330 04/22/13 0156 04/25/13 0425 04/26/13 0900  AST 38* 38*  --   --   ALT 16 16  --   --   ALKPHOS 163* 175*  --   --   BILITOT 0.4 0.4  --   --   PROT 7.0 6.5  --   --   ALBUMIN 1.8* 1.6* 1.8* 1.6*   No results found for this basename: LIPASE, AMYLASE,  in the last 168 hours No results found for this basename: AMMONIA,  in the last 168 hours CBC:  Recent Labs Lab 04/21/13 1330  04/23/13 0400  04/24/13 0527 04/24/13 1726 04/25/13 0425 04/25/13 2230 04/26/13 0414  WBC 13.3*  < > 10.2  < > 11.7* 11.1* 12.3*  13.3* 11.4*  NEUTROABS 10.8*  --  8.0*  --   --   --   --   --   --   HGB 7.1*  < > 7.6*  < > 7.9* 7.4* 7.7* 7.2* 7.7*  HCT 22.9*  < > 24.5*  < > 25.7* 23.6* 24.5* 23.7* 25.3*  MCV 74.4*  < > 74.2*  < > 75.8* 74.9* 75.2* 75.2* 75.7*  PLT 364  < > 302  < > 265 289 PLATELET CLUMPS NOTED ON SMEAR, COUNT APPEARS ADEQUATE 289 236  < > = values in this interval not displayed. Cardiac Enzymes:  Recent Labs Lab 04/21/13 1330  TROPONINI <0.30   CBG:  Recent Labs Lab 04/25/13 0535 04/25/13 1157 04/25/13 1614 04/25/13 2051 04/26/13 0601  GLUCAP 89 101* 143* 94 122*    Iron Studies: No results found for this basename: IRON, TIBC, TRANSFERRIN, FERRITIN,  in the last 72 hours Studies/Results: No results found. Marland Kitchen atorvastatin  80 mg Oral q1800  .  ceFAZolin (ANCEF) IV  2 g Intravenous Once  . collagenase   Topical Daily  . diltiazem  120 mg Oral q morning - 10a  . feeding supplement (GLUCERNA SHAKE)  237 mL Oral BID BM  . furosemide  80 mg Intravenous Q12H  . insulin aspart  0-9 Units Subcutaneous TID WC  . insulin glargine  15 Units Subcutaneous QHS  . isosorbide mononitrate  60 mg Oral q morning - 10a  . levothyroxine  150 mcg Oral QAC breakfast  . metoprolol  100 mg Oral BID  . mycophenolate  720 mg Oral BID  . pantoprazole  40 mg Oral Daily  . sirolimus  2 mg Oral q morning - 10a  . sodium chloride  3 mL Intravenous Q12H    BMET    Component Value Date/Time   NA 139 04/26/2013 0900   K 3.8 04/26/2013 0900   CL 92* 04/26/2013 0900   CO2 25 04/26/2013 0900   GLUCOSE 105* 04/26/2013 0900   BUN 46* 04/26/2013 0900   CREATININE 2.25* 04/26/2013 0900   CALCIUM 6.5* 04/26/2013 0900   GFRNONAA 30* 04/26/2013 0900   GFRAA 34* 04/26/2013 0900   CBC    Component Value Date/Time   WBC 11.4* 04/26/2013 0414   RBC 3.34* 04/26/2013 0414   RBC 3.35* 03/27/2013 0930   HGB 7.7* 04/26/2013 0414   HCT 25.3* 04/26/2013 0414   PLT 236 04/26/2013 0414   MCV 75.7* 04/26/2013 0414   MCH 23.1*  04/26/2013 0414   MCHC 30.4 04/26/2013 0414   RDW 21.4* 04/26/2013 0414   LYMPHSABS 1.7 04/23/2013 0400   MONOABS 0.4 04/23/2013 0400   EOSABS 0.1 04/23/2013 0400   BASOSABS 0.0 04/23/2013 0400     Assessment/Plan:  1. 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 Scr stable, cont with rapamune/myfortic 2. pulm edema/volume excess- improving clinically and diuresing but unsure why he has had such a quick readmission.  1. Required extra dose of IV lasix last pm with improvement of symptoms. Agree with Imdue and consider Cardiology re-eval 2. Not a great response with the increased dose, may need to increase to tid, await cards eval 3. CHF systolic (EF 82%) 4. Prostate Ca - ?spinal mets and due to CT guided biopsy in March, maybe can be done as an inpt while he is here. 5. CAD s/p PTA with stent, + myoview but no cardiac cath due to CKD 6. Hypocalcemia- resolved. 7. Severe protein malnutrition- will need protein supplementation 8. Anemia- no on ESA and has h/o prostate CA. Transfuse prn. 9. Atrial tachycardia- on po MTP and dilt 10. Deconditioning- may benefit from PT/OT eval and possible SNF vs. CIR stay  [place] A

## 2013-04-26 NOTE — Consult Note (Signed)
WOC wound consult note Reason for Consult: evaluation of sacral wound.  Pt unclear on his report of his status at home, no family at bedside.  He is aware he has pressure ulcer and that he needs to turn from side to side to stay off of this.  Wound type: Unstageable pressure ulcer: sacrum Pressure Ulcer POA: Yes  Measurement: 3.0cm x 4.5cm x 0.2cm  Wound bed: 100% yellow slough, fluctuant in the base with palpable bone but 100% covered at this time. Will consider unstageable for now. Drainage (amount, consistency, odor) moderate, serous, non foul Periwound: intact  Dressing procedure/placement/frequency: Enzymatic debridement ointment to clear the slough, cover with moist 2x2. Add chair pressure redistribution pad for when patient is sitting up and to use at DC to home.    Discussed POC with patient and bedside nurse.  Re consult if needed, will not follow at this time. Thanks  Joshua [name] [place], [place] [phone])

## 2013-04-26 NOTE — Progress Notes (Signed)
Pt very restless and anxious with some in and out of confused conversation and talking to imaginary visitor on his room.

## 2013-04-26 NOTE — ED Notes (Signed)
Patient is resting comfortably with eyes closed.  Denies pain at this time

## 2013-04-26 NOTE — Progress Notes (Signed)
Patient ID: Joshua [name], male   DOB: June 23, 1950, 63 y.o.   MRN: [id number]   Pt more confused today. I have spoken to wife regarding procedure of Thoracic 10 biopsy and Kyphoplasty to be performed today in IR with Dr Vernard Gambles. She is aware of procedure benefits and risks and agreeable to proceed. Consent signed with wife and in chart.

## 2013-04-27 ENCOUNTER — Inpatient Hospital Stay (HOSPITAL_COMMUNITY): Payer: Medicare Other

## 2013-04-27 DIAGNOSIS — R0989 Other specified symptoms and signs involving the circulatory and respiratory systems: Secondary | ICD-10-CM

## 2013-04-27 DIAGNOSIS — R0609 Other forms of dyspnea: Secondary | ICD-10-CM

## 2013-04-27 LAB — RENAL FUNCTION PANEL
Albumin: 1.7 g/dL — ABNORMAL LOW (ref 3.5–5.2)
BUN: 52 mg/dL — ABNORMAL HIGH (ref 6–23)
CALCIUM: 6.7 mg/dL — AB (ref 8.4–10.5)
CO2: 25 mEq/L (ref 19–32)
Chloride: 90 mEq/L — ABNORMAL LOW (ref 96–112)
Creatinine, Ser: 2.44 mg/dL — ABNORMAL HIGH (ref 0.50–1.35)
GFR calc non Af Amer: 27 mL/min — ABNORMAL LOW (ref >90)
GFR, EST AFRICAN AMERICAN: 31 mL/min — AB (ref >90)
GLUCOSE: 269 mg/dL — AB (ref 70–99)
Phosphorus: 6.3 mg/dL — ABNORMAL HIGH (ref 2.3–4.6)
Potassium: 4 mEq/L (ref 3.7–5.3)
SODIUM: 139 meq/L (ref 137–147)

## 2013-04-27 LAB — CBC
HCT: 24.2 % — ABNORMAL LOW (ref 39.0–52.0)
HCT: 26.6 % — ABNORMAL LOW (ref 39.0–52.0)
HCT: 24.4 % — ABNORMAL LOW (ref 39.0–52.0)
HEMOGLOBIN: 7.3 g/dL — AB (ref 13.0–17.0)
Hemoglobin: 8.1 g/dL — ABNORMAL LOW (ref 13.0–17.0)
Hemoglobin: 7.4 g/dL — ABNORMAL LOW (ref 13.0–17.0)
MCH: 22.6 pg — AB (ref 26.0–34.0)
MCH: 23.4 pg — ABNORMAL LOW (ref 26.0–34.0)
MCH: 22.9 pg — ABNORMAL LOW (ref 26.0–34.0)
MCHC: 30.2 g/dL (ref 30.0–36.0)
MCHC: 30.5 g/dL (ref 30.0–36.0)
MCHC: 30.3 g/dL (ref 30.0–36.0)
MCV: 74.9 fL — ABNORMAL LOW (ref 78.0–100.0)
MCV: 76.9 fL — ABNORMAL LOW (ref 78.0–100.0)
MCV: 75.5 fL — AB (ref 78.0–100.0)
PLATELETS: 247 10*3/uL (ref 150–400)
Platelets: 265 10*3/uL (ref 150–400)
Platelets: 302 10*3/uL (ref 150–400)
RBC: 3.23 MIL/uL — AB (ref 4.22–5.81)
RBC: 3.46 MIL/uL — AB (ref 4.22–5.81)
RBC: 3.23 MIL/uL — AB (ref 4.22–5.81)
RDW: 21.5 % — ABNORMAL HIGH (ref 11.5–15.5)
RDW: 22 % — ABNORMAL HIGH (ref 11.5–15.5)
RDW: 21.7 % — ABNORMAL HIGH (ref 11.5–15.5)
WBC: 13.7 10*3/uL — ABNORMAL HIGH (ref 4.0–10.5)
WBC: 13.7 10*3/uL — ABNORMAL HIGH (ref 4.0–10.5)
WBC: 14.7 10*3/uL — ABNORMAL HIGH (ref 4.0–10.5)

## 2013-04-27 LAB — CALCIUM, IONIZED: CALCIUM ION: 0.79 mmol/L — AB (ref 1.13–1.30)

## 2013-04-27 LAB — HIV ANTIBODY (ROUTINE TESTING W REFLEX): HIV: NONREACTIVE

## 2013-04-27 LAB — GLUCOSE, CAPILLARY
GLUCOSE-CAPILLARY: 213 mg/dL — AB (ref 70–99)
GLUCOSE-CAPILLARY: 180 mg/dL — AB (ref 70–99)
GLUCOSE-CAPILLARY: 235 mg/dL — AB (ref 70–99)
GLUCOSE-CAPILLARY: 168 mg/dL — AB (ref 70–99)

## 2013-04-27 LAB — MAGNESIUM: MAGNESIUM: 2 mg/dL (ref 1.5–2.5)

## 2013-04-27 LAB — TROPONIN I: Troponin I: <0.3 ng/mL (ref <0.30)

## 2013-04-27 MED ORDER — HYDRALAZINE HCL 25 MG PO TABS
25.0000 mg | ORAL_TABLET | Freq: Three times a day (TID) | ORAL | Status: DC
  Administered 2013-04-27 – 2013-05-05 (×23): 25 mg via ORAL
  Filled 2013-04-27 (×31): qty 1

## 2013-04-27 MED ORDER — ISOSORBIDE DINITRATE 20 MG PO TABS
40.0000 mg | ORAL_TABLET | Freq: Two times a day (BID) | ORAL | Status: DC
  Administered 2013-04-27 – 2013-05-06 (×20): 40 mg via ORAL
  Filled 2013-04-27 (×23): qty 2

## 2013-04-27 MED ORDER — CALCIUM ACETATE 667 MG PO CAPS
1334.0000 mg | ORAL_CAPSULE | Freq: Three times a day (TID) | ORAL | Status: DC
  Administered 2013-04-27 – 2013-04-29 (×6): 1334 mg via ORAL
  Filled 2013-04-27 (×9): qty 2

## 2013-04-27 MED ORDER — FUROSEMIDE 10 MG/ML IJ SOLN
120.0000 mg | Freq: Two times a day (BID) | INTRAVENOUS | Status: DC
  Administered 2013-04-27 – 2013-04-28 (×2): 120 mg via INTRAVENOUS
  Filled 2013-04-27 (×5): qty 12

## 2013-04-27 MED ORDER — ENOXAPARIN SODIUM 40 MG/0.4ML ~~LOC~~ SOLN
40.0000 mg | SUBCUTANEOUS | Status: DC
  Administered 2013-04-27: 40 mg via SUBCUTANEOUS
  Filled 2013-04-27 (×2): qty 0.4

## 2013-04-27 MED ORDER — SODIUM CHLORIDE 0.9 % IV SOLN
1.0000 g | Freq: Once | INTRAVENOUS | Status: AC
  Administered 2013-04-27: 1 g via INTRAVENOUS
  Filled 2013-04-27: qty 10

## 2013-04-27 MED ORDER — ALBUTEROL SULFATE (2.5 MG/3ML) 0.083% IN NEBU
2.5000 mg | INHALATION_SOLUTION | RESPIRATORY_TRACT | Status: DC | PRN
  Administered 2013-04-27 – 2013-05-01 (×12): 2.5 mg via RESPIRATORY_TRACT
  Filled 2013-04-27 (×13): qty 3

## 2013-04-27 MED ORDER — ACETAMINOPHEN 325 MG PO TABS
650.0000 mg | ORAL_TABLET | Freq: Four times a day (QID) | ORAL | Status: DC | PRN
  Administered 2013-04-27 – 2013-05-07 (×8): 650 mg via ORAL
  Filled 2013-04-27 (×9): qty 2

## 2013-04-27 MED ORDER — FUROSEMIDE 10 MG/ML IJ SOLN: 40.0000 mg | Freq: Once | INTRAMUSCULAR | Status: DC

## 2013-04-27 MED ORDER — ASPIRIN 81 MG PO CHEW
81.0000 mg | CHEWABLE_TABLET | Freq: Once | ORAL | Status: AC
  Administered 2013-04-27: 81 mg via ORAL

## 2013-04-27 MED ORDER — FUROSEMIDE 10 MG/ML IJ SOLN
80.0000 mg | Freq: Once | INTRAMUSCULAR | Status: AC
  Administered 2013-04-27: 80 mg via INTRAVENOUS
  Filled 2013-04-27: qty 8

## 2013-04-27 MED ORDER — ASPIRIN 81 MG PO CHEW
CHEWABLE_TABLET | ORAL | Status: AC
  Filled 2013-04-27: qty 1

## 2013-04-27 MED ORDER — ADULT MULTIVITAMIN W/MINERALS CH
1.0000 | ORAL_TABLET | Freq: Every day | ORAL | Status: DC
  Administered 2013-04-28 – 2013-05-14 (×15): 1 via ORAL
  Filled 2013-04-27 (×19): qty 1

## 2013-04-27 NOTE — Evaluation (Signed)
Physical Therapy Evaluation Patient Details Name: Joshua [name] MRN: [id number] DOB: [birth year] Today's Date: 04/27/2013 Time: 0109-3235 PT Time Calculation (min): 24 min  PT Assessment / Plan / Recommendation History of Present Illness  Pt admitted with CHF, ESRD, underwent T 10 kyphoplasty, has sacral wound  Clinical Impression  Pt presents with decreased strength, balance and mobility.  Limited by tremors in UE and LEs, cognition impaired.  Pt will benefit from acute PT services to address deficits and increase functional mobility.  Recommend SNF for continued rehab for safe d/c to home.    PT Assessment  Patient needs continued PT services    Follow Up Recommendations  SNF    Does the patient have the potential to tolerate intense rehabilitation      Barriers to Discharge Decreased caregiver support wife unable to perform physical assistance    Equipment Recommendations  None recommended by PT    Recommendations for Other Services OT consult   Frequency Min 3X/week    Precautions / Restrictions Precautions Precautions: Fall Precaution Comments: chair alarm, bed alarm Restrictions Weight Bearing Restrictions: No   Pertinent Vitals/Pain spO2 on 3LO2 86% with activity, RN made aware.  No c/o pain.      Mobility  Bed Mobility Overal bed mobility: Needs Assistance Bed Mobility: Supine to Sit Supine to sit: Min assist General bed mobility comments: lfiting assist at trunk Transfers Overall transfer level: Needs assistance Sit to Stand: Min assist General transfer comment: lifting and steadying assist, cues for safety with RW Ambulation/Gait Ambulation/Gait assistance: Min assist Ambulation Distance (Feet): 20 Feet Assistive device: Rolling walker (2 wheeled) Gait velocity interpretation: Below normal speed for age/gender General Gait Details: pt requires min A due to tremors, decreased gait speed, assist to maneuver RW due to UE tremors    Exercises     PT  Diagnosis: Difficulty walking;Abnormality of gait;Generalized weakness  PT Problem List: Decreased strength;Decreased knowledge of use of DME;Decreased activity tolerance;Decreased safety awareness;Decreased balance;Cardiopulmonary status limiting activity;Decreased mobility;Decreased coordination PT Treatment Interventions: DME instruction;Gait training;Patient/family education;Stair training;Wheelchair mobility training;Functional mobility training;Therapeutic activities;Modalities;Therapeutic exercise;Balance training;Neuromuscular re-education     PT Goals(Current goals can be found in the care plan section) Acute Rehab PT Goals Patient Stated Goal: go for a walk PT Goal Formulation: With patient Time For Goal Achievement: 05/04/13 Potential to Achieve Goals: Good  Visit Information  Last PT Received On: 04/27/13 Assistance Needed: +1 History of Present Illness: Pt admitted with CHF, ESRD, underwent T 10 kyphoplasty, has sacral wound       Prior [place] expects to be discharged to:: Private residence Living Arrangements: Spouse/significant other;Children Available Help at Discharge: Family;Available 24 hours/day Type of Home: House Home Access: Stairs to enter [employer] of Steps: 3 Entrance Stairs-Rails: Left Home Layout: One level Home Equipment: Walker - 2 wheels Additional Comments: pt is not an accurate historian, difficult to determine prior level of function. pt states his wife helped with ADLs and gait, states he did not wear O2 at home Prior Function Level of Independence: Needs assistance Communication Communication: No difficulties    Cognition  Cognition Arousal/Alertness: Awake/alert Behavior During Therapy: WFL for tasks assessed/performed Overall Cognitive Status: No family/caregiver present to determine baseline cognitive functioning Problem Solving: Slow processing    Extremity/Trunk Assessment Upper Extremity  Assessment Upper Extremity Assessment: Generalized weakness (B UE tremors) Lower Extremity Assessment Lower Extremity Assessment: Generalized weakness (B LE tremors) Cervical / Trunk Assessment Cervical / Trunk Assessment: Normal   Balance Balance Standing balance  support: Bilateral upper extremity supported Standing balance-Leahy Scale: Poor  End of Session PT - End of Session Equipment Utilized During Treatment: Gait belt;Oxygen Activity Tolerance: Patient tolerated treatment well Patient left: in chair;with call bell/phone within reach;with nursing/sitter in room;with chair alarm set Nurse Communication: Mobility status;Precautions  GP     Joshua [name] 04/27/2013, 8:02 AM

## 2013-04-27 NOTE — Progress Notes (Signed)
Subjective: Pt with Thoracic spine anatomy variance; lesion actually T 12 per Dr Vernard Gambles T12 lesion biopsy performed 2/18 in IR Kyphoplasty performed same time Pt has done well Low grade temp this am: 100 Up in chair; eating Back pain less per pt  Objective: Vital signs in last 24 hours: Temp:  [98.1 F (36.7 C)-100 F (37.8 C)] 100 F (37.8 C) (02/19 0459) Pulse Rate:  [74-111] 82 (02/19 0459) Resp:  [17-22] 18 (02/19 0459) BP: (120-165)/(54-88) 138/75 mmHg (02/19 0459) SpO2:  [92 %-100 %] 96 % (02/19 0459) Weight:  [81.466 kg (179 lb 9.6 oz)] 81.466 kg (179 lb 9.6 oz) (02/19 0459) Last BM Date: 04/24/13  Intake/Output from previous day: 02/18 0701 - 02/19 0700 In: 370 [P.O.:200; IV Piggyback:170] Out: 1075 [Urine:1075] Intake/Output this shift:    PE:   VSS; low grade temp Sites of T 12 lesion biopsy and KP are NT; no bleeding Wbc 13.7 (12.3) T: 100 Pt moving all 4s  Lab Results:   Recent Labs  04/26/13 1635 04/27/13 0434  WBC 12.3* 13.7*  HGB 8.1* 7.3*  HCT 27.0* 24.2*  PLT 286 265   BMET  Recent Labs  04/26/13 0900 04/27/13 0434  NA 139 139  K 3.8 4.0  CL 92* 90*  CO2 25 25  GLUCOSE 105* 269*  BUN 46* 52*  CREATININE 2.25* 2.44*  CALCIUM 6.5* 6.7*   PT/INR  Recent Labs  04/26/13 0414  LABPROT 16.6*  INR 1.38   ABG No results found for this basename: PHART, PCO2, PO2, HCO3,  in the last 72 hours  Studies/Results: Ir Kypho Vertebral Thoracic Augmentation  04/26/2013   CLINICAL DATA:  Prostate carcinoma. Multiple axial lesions on thoracic and lumbar MRI. Painful compression fracture deformity of T12.  EXAM: 1.  DEEP BONE BIOPSY THORACIC T12 UNDER FLUOROSCOPY  2.  KYPHOPLASTY AT THORACIC T12  TECHNIQUE: The procedure, risks (including but not limited to bleeding, infection, organ damage), benefits, and alternatives were explained to the patient. Questions regarding the procedure were encouraged and answered. The patient understands and  consents to the procedure.  The patient was placed prone on the fluoroscopic table. The skin overlying the lower thoracic region was then prepped and draped in the usual sterile fashion. Maximal barrier sterile technique was utilized including caps, mask, sterile gowns, sterile gloves, sterile drape, hand hygiene and skin antiseptic.  Intravenous Fentanyl and Versed were administered as conscious sedation during continuous cardiorespiratory monitoring by the radiology RN, with a total moderate sedation time of40 minutes.  As antibiotic prophylaxis, !cefazolin 1 gram! was ordered pre-procedure and administered intravenously within !one hour! of incision.  The thoracic compression deformity of T12 was localized.The right pedicle at T12 was then infiltrated with 1% lidocaine followed by the advancement of a Kyphon trocar needle through the right pedicle into the posterior one-third. The trocar was removed and a coaxial core bone biopsy was obtained. Subsequently, The osteo drill was advanced to the anterior third of the vertebral body. The osteo drill was retracted. Through the working cannula, a Kyphon inflatable bone tamp 15 x 3 was advanced and positioned with the distal marker 5 mm from the anterior aspect of the cortex. Crossing of the midline was not seen on the AP projection. At this time, the balloon was expanded using contrast via a Kyphon inflation syringe device via micro tubing.  In similar fashion, the left pedicle was infiltrated with 1% lidocaine followed by the advancement of a second Kyphon trocar needle through the left  pedicle into the posterior third of the vertebral body. Coaxial core deep bone biopsy sample obtained. The osteo drill was coaxially advanced to the anterior right third. The osteo drill was exchanged for a Kyphon inflatable bone tamp 15x 3, advanced to the 5 mm of the anterior aspect of the cortex. The balloon was then expanded using contrast as above.  Inflations were continued until  there was near apposition across the midline and approaching the superior and the inferior end plates.  At this time, methylmethacrylate mixture was reconstituted in the Kyphon bone mixing device system. This was then loaded into the delivery mechanism, attached to Kyphon bone fillers.  The balloons were deflated and removed followed by the instillation of methylmethacrylate mixture with excellent filling in the AP and lateral projections. No extravasation was noted in the disk spaces or posteriorly into the spinal canal. No epidural venous contamination was seen.  The patient tolerated the procedure well. There were no acute complications. The working cannulae and the bone filler were then retrieved and removed.  IMPRESSION: 1. Deep bone core biopsy of thoracic T12 lesion. 2. Status post vertebral body augmentation using balloon kyphoplasty at thoracic T12 as described without event.   Electronically Signed   By: Arne Cleveland M.D.   On: 04/26/2013 18:14    Anti-infectives: Anti-infectives   Start     Dose/Rate Route Frequency Ordered Stop   04/26/13 1300  ceFAZolin (ANCEF) IVPB 2 g/50 mL premix    Comments:  ON CALL TO IR   2 g 100 mL/hr over 30 Minutes Intravenous  Once 04/25/13 1443 04/26/13 1431   04/25/13 1445  ceFAZolin (ANCEF) IVPB 1 g/50 mL premix  Status:  Discontinued    Comments:  ON CALL TO IR   1 g 100 mL/hr over 30 Minutes Intravenous  Once 04/25/13 1440 04/25/13 1443      Assessment/Plan: s/p * No surgery found *  T12 bx/KP 2/18 Doing well this am Encourage ambulation with help Watch temp and wbc   LOS: 6 days    Laksh Hinners A 04/27/2013

## 2013-04-27 NOTE — Clinical Social Work Placement (Signed)
Clinical Social Work Department CLINICAL SOCIAL WORK PLACEMENT NOTE 04/27/2013  Patient:  Grundman,Eliyah  Account Number:  [id number] Admit date:  04/21/2013  Clinical Social Worker:  Crawford Givens, LCSW  Date/time:  04/27/2013 02:40 PM  Clinical Social Work is seeking post-discharge placement for this patient at the following level of care:   SKILLED NURSING   (*CSW will update this form in Epic as items are completed)   04/27/2013  Patient/family provided with [facility] Department of Clinical Social Work's list of facilities offering this level of care within the geographic area requested by the patient (or if unable, by the patient's family).  04/27/2013  Patient/family informed of their freedom to choose among providers that offer the needed level of care, that participate in Medicare, Medicaid or managed care program needed by the patient, have an available bed and are willing to accept the patient.    Patient/family informed of [facility]' ownership interest in [facility], as well as of the fact that they are under no obligation to receive care at this facility.  PASARR submitted to EDS on 04/27/2013 PASARR number received from EDS on 04/27/2013  FL2 transmitted to all facilities in geographic area requested by pt/family on  04/27/2013 FL2 transmitted to all facilities within larger geographic area on   Patient informed that his/her managed care company has contracts with or will negotiate with  certain facilities, including the following:     Patient/family informed of bed offers received:   Patient chooses bed at  Physician recommends and patient chooses bed at    Patient to be transferred to  on   Patient to be transferred to facility by   The following physician request were entered in Epic:   Additional Comments:   Wellington [place], [place] Intern.

## 2013-04-27 NOTE — Progress Notes (Signed)
Respiratory therapy unable to draw ABG this morning.  MD aware.  No further orders at this time.  Will continue to monitor.

## 2013-04-27 NOTE — Progress Notes (Signed)
NUTRITION FOLLOW UP  Intervention:   Continue Glucerna Shake po BID, each supplement provides 220 kcal and 10 grams of protein.   Provide Multivitamin with minerals daily Provide snack once daily Encourage PO intake RD to continue to follow nutrition care plan.   Nutrition Dx:   Inadequate oral intake related to chronic illness as evidenced by weight loss and decreased intake; ongoing   Goal:   Pt to meet >/= 90% of their estimated nutrition needs; unmet  Monitor:   Weight, labs, I/O's, PO intake  Assessment:   Patient with PMH of HTN, DM-II, CAD, hx of paillary thyroid cancer, kidney transplantation, carotid artery stenosis, PVD, and prostate cancer. Recently hospitalized 2 weeks ago. Patient presents edema and dyspnea x 3 days. T10 bone biopsy and kyphoplasty done on 04/26/13.  Pt reports ongoing poor appetite. Per nursing notes pt is eating 10-50% of most meals, 100% of a few meals. Lunch tray at bedside- pt ate applesauce but nothing else. Pt reports early satiety. Encouraged pt to eat at each meal. Encouraged intake of Glucerna Shakes and snacks in between meals. Pt's weight has dropped 8 lbs since admission. Net I/O -5 Liters since admission.   Labs noted. High phosphorus, low calcium, low GFR, high BUN, high creatinine, low hemoglobin  Height: Ht Readings from Last 1 Encounters:  04/21/13 5\' 11"  (1.803 m)    Weight Status:   Wt Readings from Last 1 Encounters:  04/27/13 179 lb 9.6 oz (81.466 kg)    Re-estimated needs:  Kcal: 2000-2300  Protein: 90-100 grams  Fluid: 1.2 L restriction per MD  Skin: +1 RLE and LLE edema; unstageable pressure ulcer on sacrum  Diet Order: Diabetic   Intake/Output Summary (Last 24 hours) at 04/27/13 1406 Last data filed at 04/27/13 1104  Gross per 24 hour  Intake    683 ml  Output    975 ml  Net   -292 ml    Last BM: 2/16   Labs:   Recent Labs Lab 04/25/13 0425 04/26/13 0900 04/27/13 0434 04/27/13 0915  NA 142 139  139  --   K 3.4* 3.8 4.0  --   CL 94* 92* 90*  --   CO2 25 25 25   --   BUN 46* 46* 52*  --   CREATININE 2.19* 2.25* 2.44*  --   CALCIUM 6.6* 6.5* 6.7*  --   MG  --   --   --  2.0  PHOS 5.5* 5.7* 6.3*  --   GLUCOSE 90 105* 269*  --     CBG (last 3)   Recent Labs  04/26/13 2141 04/27/13 0704 04/27/13 1101  GLUCAP 177* 213* 180*    Scheduled Meds: . antiseptic oral rinse  15 mL Mouth Rinse BID  . atorvastatin  80 mg Oral q1800  . calcium acetate  1,334 mg Oral TID WC  . collagenase   Topical Daily  . enoxaparin (LOVENOX) injection  40 mg Subcutaneous Q24H  . feeding supplement (GLUCERNA SHAKE)  237 mL Oral BID BM  . furosemide  120 mg Intravenous Q12H  . hydrALAZINE  25 mg Oral 3 times per day  . insulin aspart  0-9 Units Subcutaneous TID WC  . insulin glargine  15 Units Subcutaneous QHS  . isosorbide dinitrate  40 mg Oral BID  . levothyroxine  150 mcg Oral QAC breakfast  . metoprolol  100 mg Oral BID  . mycophenolate  720 mg Oral BID  . pantoprazole  40 mg Oral  Daily  . sirolimus  2 mg Oral q morning - 10a  . sodium chloride  3 mL Intravenous Q12H    Continuous Infusions:   Pryor Ochoa RD, LDN Inpatient Clinical Dietitian Pager: [phone] After Hours Pager: [phone]

## 2013-04-27 NOTE — Progress Notes (Signed)
Patient Name: Joshua [name] Date of Encounter: 04/27/2013  Principal Problem:   Volume overload Active Problems:   Hypertension   Dyslipidemia, goal LDL below 70   Anemia   End stage renal disease- s/p transplant   Controlled diabetes mellitus type 2 with complications   Acute on chronic combined systolic and diastolic CHF (congestive heart failure)   Length of Stay: 6  SUBJECTIVE  Waxing/waning confusion. Mild dyspnea at rest is also intermittent.  CURRENT MEDS . antiseptic oral rinse  15 mL Mouth Rinse BID  . atorvastatin  80 mg Oral q1800  . collagenase   Topical Daily  . feeding supplement (GLUCERNA SHAKE)  237 mL Oral BID BM  . furosemide  80 mg Intravenous Q12H  . hydrALAZINE  25 mg Oral 3 times per day  . insulin aspart  0-9 Units Subcutaneous TID WC  . insulin glargine  15 Units Subcutaneous QHS  . isosorbide dinitrate  40 mg Oral BID  . levothyroxine  150 mcg Oral QAC breakfast  . metoprolol  100 mg Oral BID  . mycophenolate  720 mg Oral BID  . pantoprazole  40 mg Oral Daily  . sirolimus  2 mg Oral q morning - 10a  . sodium chloride  3 mL Intravenous Q12H    OBJECTIVE   Intake/Output Summary (Last 24 hours) at 04/27/13 1013 Last data filed at 04/27/13 [id number]  Gross per 24 hour  Intake    613 ml  Output    675 ml  Net    -62 ml   Filed Weights   04/25/13 0545 04/26/13 0616 04/27/13 0459  Weight: 82.9 kg (182 lb 12.2 oz) 82.736 kg (182 lb 6.4 oz) 81.466 kg (179 lb 9.6 oz)    PHYSICAL EXAM Filed Vitals:   04/26/13 1535 04/26/13 2143 04/27/13 0459 04/27/13 0948  BP: 125/65 120/57 138/75 132/69  Pulse: 111 88 82 83  Temp: 98.1 F (36.7 C) 99 F (37.2 C) 100 F (37.8 C) 98.1 F (36.7 C)  TempSrc:  Oral Oral Oral  Resp: 18 20 18    Height:      Weight:   81.466 kg (179 lb 9.6 oz)   SpO2: 100% 99% 96% 99%   General: Alert, oriented x3, no distress Head: no evidence of trauma, PERRL, EOMI, no exophtalmos or lid lag, no myxedema, no xanthelasma;  normal ears, nose and oropharynx Neck: 6-8 cm jugular venous pulsations and +ve hepatojugular reflux; brisk carotid pulses without delay and no carotid bruits Chest: clear to auscultation, no signs of consolidation by percussion or palpation, normal fremitus, symmetrical and full respiratory excursions Cardiovascular: normal position and quality of the apical impulse, irregular rhythm, normal first and second heart sounds, no rubs or gallops, 1/6 systolic murmur Abdomen: no tenderness or distention, no masses by palpation, no abnormal pulsatility or arterial bruits, normal bowel sounds, no hepatosplenomegaly Extremities: large AV fistula left arm, pulsatile but no thrill, no clubbing, cyanosis or edema; 2+ radial, ulnar and brachial pulses bilaterally; 2+ right femoral, posterior tibial and dorsalis pedis pulses; 2+ left femoral, posterior tibial and dorsalis pedis pulses; no subclavian or femoral bruits Neurological: involuntary jerks, myoclonic appearing  LABS  CBC  Recent Labs  04/27/13 0434 04/27/13 0915  WBC 13.7* 13.7*  HGB 7.3* 8.1*  HCT 24.2* 26.6*  MCV 74.9* 76.9*  PLT 265 [id number]   Basic Metabolic Panel  Recent Labs  04/26/13 0900 04/27/13 0434  NA 139 139  K 3.8 4.0  CL 92* 90*  CO2 25  25  GLUCOSE 105* 269*  BUN 46* 52*  CREATININE 2.25* 2.44*  CALCIUM 6.5* 6.7*  PHOS 5.7* 6.3*   Liver Function Tests  Recent Labs  04/26/13 0900 04/27/13 0434  ALBUMIN 1.6* 1.7*   Radiology Studies Imaging results have been reviewed and Dg Chest Port 1 View  04/27/2013   CLINICAL DATA:  Increasing shortness of breath question fluid overload  EXAM: PORTABLE CHEST - 1 VIEW  COMPARISON:  Portable exam 4098 hr compared to 04/24/2013  FINDINGS: Enlargement of cardiac silhouette with pulmonary vascular congestion.  Diffuse interstitial infiltrates consistent with pulmonary edema.  Additional atelectasis versus consolidation left lower lobe.  No pleural effusion or pneumothorax.   IMPRESSION: CHF with atelectasis versus consolidation in left lower lobe.   Electronically Signed   By: Lavonia Dana M.D.   On: 04/27/2013 09:14   Ir Kypho Vertebral Thoracic Augmentation  04/26/2013   CLINICAL DATA:  Prostate carcinoma. Multiple axial lesions on thoracic and lumbar MRI. Painful compression fracture deformity of T12.  EXAM: 1.  DEEP BONE BIOPSY THORACIC T12 UNDER FLUOROSCOPY  2.  KYPHOPLASTY AT THORACIC T12  TECHNIQUE: The procedure, risks (including but not limited to bleeding, infection, organ damage), benefits, and alternatives were explained to the patient. Questions regarding the procedure were encouraged and answered. The patient understands and consents to the procedure.  The patient was placed prone on the fluoroscopic table. The skin overlying the lower thoracic region was then prepped and draped in the usual sterile fashion. Maximal barrier sterile technique was utilized including caps, mask, sterile gowns, sterile gloves, sterile drape, hand hygiene and skin antiseptic.  Intravenous Fentanyl and Versed were administered as conscious sedation during continuous cardiorespiratory monitoring by the radiology RN, with a total moderate sedation time of40 minutes.  As antibiotic prophylaxis, !cefazolin 1 gram! was ordered pre-procedure and administered intravenously within !one hour! of incision.  The thoracic compression deformity of T12 was localized.The right pedicle at T12 was then infiltrated with 1% lidocaine followed by the advancement of a Kyphon trocar needle through the right pedicle into the posterior one-third. The trocar was removed and a coaxial core bone biopsy was obtained. Subsequently, The osteo drill was advanced to the anterior third of the vertebral body. The osteo drill was retracted. Through the working cannula, a Kyphon inflatable bone tamp 15 x 3 was advanced and positioned with the distal marker 5 mm from the anterior aspect of the cortex. Crossing of the midline was  not seen on the AP projection. At this time, the balloon was expanded using contrast via a Kyphon inflation syringe device via micro tubing.  In similar fashion, the left pedicle was infiltrated with 1% lidocaine followed by the advancement of a second Kyphon trocar needle through the left pedicle into the posterior third of the vertebral body. Coaxial core deep bone biopsy sample obtained. The osteo drill was coaxially advanced to the anterior right third. The osteo drill was exchanged for a Kyphon inflatable bone tamp 15x 3, advanced to the 5 mm of the anterior aspect of the cortex. The balloon was then expanded using contrast as above.  Inflations were continued until there was near apposition across the midline and approaching the superior and the inferior end plates.  At this time, methylmethacrylate mixture was reconstituted in the Kyphon bone mixing device system. This was then loaded into the delivery mechanism, attached to Kyphon bone fillers.  The balloons were deflated and removed followed by the instillation of methylmethacrylate mixture with excellent filling in  the AP and lateral projections. No extravasation was noted in the disk spaces or posteriorly into the spinal canal. No epidural venous contamination was seen.  The patient tolerated the procedure well. There were no acute complications. The working cannulae and the bone filler were then retrieved and removed.  IMPRESSION: 1. Deep bone core biopsy of thoracic T12 lesion. 2. Status post vertebral body augmentation using balloon kyphoplasty at thoracic T12 as described without event.   Electronically Signed   By: Arne Cleveland M.D.   On: 04/26/2013 18:14    TELE AF with controlled rate  ASSESSMENT AND PLAN Making some progress with diuresis, but his BUN is higher and phosphorus level substantially higher. Increase hydralazine/nitrates as BP permits. Complex list of comorbid conditions makes cardiac management challenging.   Sanda Klein, MD, [facility] [phone] office [phone] pager 04/27/2013 10:13 AM

## 2013-04-27 NOTE — Progress Notes (Signed)
Patient transferred to [place].  Report called to RN on [place]; [facility] notified of patient's transfer.  Patient stable upon transfer.

## 2013-04-27 NOTE — Progress Notes (Signed)
Internal Medicine Attending  Date: 04/27/2013  Patient name: Joshua [name] Medical record number: [id number] Date of birth: May 14, 1950 Age: 63 y.o. Gender: male  I saw and evaluated the patient. I reviewed the resident's note by Dr. Redmond Pulling and I agree with the resident's findings and plans as documented in her progress note.  Joshua [name] had increased dyspnea this morning.  CXR demonstrates a left sided effusion with associated atelectasis Vs consolidation of part of the lower lobe.  We are favoring continued volume overload and atelectasis over an HCAP given clinical picture and improvement with an extra dose of lasix.  His weight is down 9 pounds since admission and his afterload remains above target at 138/75.  We have decided to increase the lasix dose to 120 mg IV BID and monitor I&Os and weights.  We added hydralazine to the regimen yesterday so we will not adjust the dose upwards just yet but monitor the BP on the hydralazine.  We will have a low threshold to add antibiotics for an HCAP should he not respond to the diuretics or develop signs and symptoms more consistent with an HCAP.  He has been given an incentive spirometer and will be encouraged to use it given our concern for some pulmonary atelectasis.  As his symptoms (dyspnea and back pain) improve, we will make sure he gets out of bed to a chair to assist with atelectasis prevention.  Finally, if we become limited from further diuresis secondary to renal issues will consider asking Cardiology if he may be a candidate for inotropic therapy or CVVH for further volume removal.

## 2013-04-27 NOTE — Clinical Social Work Psychosocial (Signed)
Assessment reviewed and approved as written.  Torrin Frein Givens, MSW, LCSW [phone]

## 2013-04-27 NOTE — Progress Notes (Addendum)
Subjective: Joshua [name] was seen and examined this morning.  He says he started to feel SOB this morning.  He feels as if he has fluid on him.  Currently breathing okay on 3L.  He has eaten breakfast and there is an empty breakfast tray in the room.    Objective: Vital signs in last 24 hours: Filed Vitals:   04/26/13 1516 04/26/13 1535 04/26/13 2143 04/27/13 0459  BP: 146/88 125/65 120/57 138/75  Pulse: 86 111 88 82  Temp:  98.1 F (36.7 C) 99 F (37.2 C) 100 F (37.8 C)  TempSrc:   Oral Oral  Resp: 18 18 20 18   Height:      Weight:    81.466 kg (179 lb 9.6 oz)  SpO2: 100% 100% 99% 96%   Weight change: -1.27 kg (-2 lb 12.8 oz)  Intake/Output Summary (Last 24 hours) at 04/27/13 0753 Last data filed at 04/26/13 2143  Gross per 24 hour  Intake    370 ml  Output   1075 ml  Net   -705 ml   General: Sitting up in chair, NAD; tremulous Cardiac: RRR, no rubs, murmurs or gallops Pulm: wheezing, coarse breath sounds, no crackles appreciated Abd: distended, soft, BS present, non-tender Ext: warm and well perfused, lower extremity edema resolved Neuro: alert and oriented X3  Lab Results: Basic Metabolic Panel:  Recent Labs Lab 04/26/13 0900 04/27/13 0434  NA 139 139  K 3.8 4.0  CL 92* 90*  CO2 25 25  GLUCOSE 105* 269*  BUN 46* 52*  CREATININE 2.25* 2.44*  CALCIUM 6.5* 6.7*  PHOS 5.7* 6.3*   Liver Function Tests:  Recent Labs Lab 04/21/13 1330 04/22/13 0156  04/26/13 0900 04/27/13 0434  AST 38* 38*  --   --   --   ALT 16 16  --   --   --   ALKPHOS 163* 175*  --   --   --   BILITOT 0.4 0.4  --   --   --   PROT 7.0 6.5  --   --   --   ALBUMIN 1.8* 1.6*  < > 1.6* 1.7*  < > = values in this interval not displayed. CBC:  Recent Labs Lab 04/21/13 1330  04/23/13 0400  04/26/13 1635 04/27/13 0434  WBC 13.3*  < > 10.2  < > 12.3* 13.7*  NEUTROABS 10.8*  --  8.0*  --   --   --   HGB 7.1*  < > 7.6*  < > 8.1* 7.3*  HCT 22.9*  < > 24.5*  < > 27.0* 24.2*  MCV  74.4*  < > 74.2*  < > 76.1* 74.9*  PLT 364  < > 302  < > 286 265  < > = values in this interval not displayed.  CBG:  Recent Labs Lab 04/25/13 2051 04/26/13 0601 04/26/13 1113 04/26/13 1615 04/26/13 2141 04/27/13 0704  GLUCAP 94 122* 125* 125* 177* 213*   Medications: I have reviewed the patient's current medications. Scheduled Meds: . antiseptic oral rinse  15 mL Mouth Rinse BID  . atorvastatin  80 mg Oral q1800  . collagenase   Topical Daily  . feeding supplement (GLUCERNA SHAKE)  237 mL Oral BID BM  . furosemide  80 mg Intravenous Q12H  . hydrALAZINE  25 mg Oral 3 times per day  . insulin aspart  0-9 Units Subcutaneous TID WC  . insulin glargine  15 Units Subcutaneous QHS  . isosorbide dinitrate  40 mg Oral BID  . levothyroxine  150 mcg Oral QAC breakfast  . metoprolol  100 mg Oral BID  . mycophenolate  720 mg Oral BID  . pantoprazole  40 mg Oral Daily  . sirolimus  2 mg Oral q morning - 10a  . sodium chloride  3 mL Intravenous Q12H   Continuous Infusions: none PRN Meds:.acetaminophen, albuterol, diphenhydrAMINE, oxyCODONE  Assessment/Plan: 63 year old male with a PMH of HTN, DM type 2, CAD (s/p MI and LAD stent), kidney transplantation, carotid artery stenosis (s/p CEA 08/2011), PVD (s/p fem-pop in 1980), dHF (EF 45-50% Jan 2015), hx of papillary thyroid cancer and prostate cancer who presents with complaint of edema x 3 days and dyspnea for several weeks.   Volume overload:  Likely 2/2 worsening CHF in the setting of CKD about 13ys s/p renal transplant.  Down 4.9L and 9 pounds since admission.  Symptoms were improving with diuresis but his abdomen is distended this morning and he feels dyspneic and edematous. - IV Lasix 80mg  IV BID --> 120mg  BID, add metolazone as needed - Coreg 100mg  BID, hydralazine 10mg  q8h; imdur --> isordil 40mg  BID - Daily weights and I&Os   Dyspnea:  Increased dyspnea this AM. Portable CXR improved from 4 days ago.  There is evidence of  pulmonary edema but also concern for atelectasis vs consolidation in LLL.   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 Baseline Cr 1.9-2.5. Creatine 2.24 on admisison.  Nephrology following. Continuing Sirolimus and Myfortic.  Sirolimus level therapeutic - 13.2.  - Continue anti-rejection meds  T10 lesion noted on prior MRI:  concerning for mets from Prostate CA.  Followed by Dr. Diona Fanti (Urology).  - T10 bone biopsy and kyphoplasty done on 04/26/13.  Acute on chronic anemia: Stable.  Hgb 7.1 on admission and tx 1u pRBCs, responded to 8.1. Hgb 8.1 this morning. No active bleeding.  - q12 CBC  Hypocalcemia: 6.3 on admission and he received calcium gluconate.  Repeat on 12/15 was 6.1 w/ albumin 1.6. Corrected Ca was 8.0. However ionized Calcium was low at 0.71. Replacing Calcium.  Repeating ionized Ca.  DM type 2: Stable. Previously on 24 units Lantus at home, but wife reported hypoglycemic episodes, so she was administering 12 units over the past few days.  Pt started on Lantus 15u on admission w/ SSI. - continue Lantus 15 units qHS and SSI   HTN:  Stable but ideally should be lower.  Will monitor the effect of hydralazine which was just added yesterday. May need additional agent.  Continued lopressor, hydralazine, lasix, isordil.  H/o prostate cancer: Question of spinal mets at last hospitalization, reportedly seen on MRI, but negative bone scan 02/2013. Pt seen by Dr. Diona Fanti with Urology, and per his last hospital d/c, wanted the pt to be worked up as an outpatient.  - Continue Oxycodone IR 10mg  q 4h PRN - T10 biopsy done inpatient on 02/18  DVT ppx: SCDs and lovenox  Addendum:  Paged by nurse at approximately 3:15PM because patient had sudden onset 10/10 central chest pain that started during nebulizer  treatment.  Chest pain described as sharp but also pressure.  He denies N/V or radiation to arm or jaw.  He has increased dyspnea.  Patient in NSR on telemetry.  Stat EKG without  concerning findings.  Aspirin given and Troponins ordered x 3.   Dispo: Disposition is deferred at this time, awaiting improvement of current medical problems.  Anticipated discharge in approximately 1-2 day(s).   The patient does have a current PCP (Sueanne Margarita, MD) and may need an [facility] hospital follow-up appointment after discharge.  The patient does not know have transportation limitations that hinder transportation to clinic appointments.  .Services Needed at time of discharge: Y = Yes, Blank = No PT:   OT:   RN:   Equipment:   Other:     LOS: 6 days   Joshua Maxin, DO 04/27/2013, 7:53 AM

## 2013-04-27 NOTE — Progress Notes (Signed)
Patient had incidence of chest pain, rating 9/10.  EKG performed, MD notified and at bedside.  Aspirin 81mg  ordered and administered.  Patient states some relief from chest pain when repositioned and with calming techniques.  Will continue to monitor.

## 2013-04-27 NOTE — Progress Notes (Signed)
Patient ID: Joshua [name], male   DOB: 15-Jul-1950, 63 y.o.   MRN: [id number] S:no new complaints O:BP 132/69  Pulse 83  Temp(Src) 98.1 F (36.7 C) (Oral)  Resp 18  Ht 5\' 11"  (1.803 m)  Wt 81.466 kg (179 lb 9.6 oz)  BMI 25.06 kg/m2  SpO2 99%  Intake/Output Summary (Last 24 hours) at 04/27/13 1057 Last data filed at 04/27/13 0952  Gross per 24 hour  Intake    613 ml  Output    675 ml  Net    -62 ml   Intake/Output: I/O last 3 completed shifts: In: 46 [P.O.:260; IV Piggyback:170] Out: 1925 [Urine:1925]  Intake/Output this shift:  Total I/O In: 243 [P.O.:240; I.V.:3] Out: -  Weight change: -1.27 kg (-2 lb 12.8 oz) IEP:PIRJJ AAM in NAD CVS:no rub Resp:cta OAC:ZYSAYT Ext:tr edema, LUE AVF +T/B   Recent Labs Lab 04/21/13 1330 04/22/13 0156 04/22/13 0849 04/23/13 0400 04/24/13 1341 04/25/13 0425 04/26/13 0900 04/27/13 0434  NA 138 139 138 141 141 142 139 139  K 4.5 4.5 4.4 3.7 3.7 3.4* 3.8 4.0  CL 93* 93* 92* 94* 93* 94* 92* 90*  CO2 24 22 22 23 24 25 25 25   GLUCOSE 132* 179* 153* 141* 138* 90 105* 269*  BUN 45* 47* 48* 52* 48* 46* 46* 52*  CREATININE 2.24* 2.27* 2.32* 2.38* 2.22* 2.19* 2.25* 2.44*  ALBUMIN 1.8* 1.6*  --   --   --  1.8* 1.6* 1.7*  CALCIUM 6.1* 6.0* 6.1* 6.0* 6.7* 6.6* 6.5* 6.7*  PHOS  --   --   --   --   --  5.5* 5.7* 6.3*  AST 38* 38*  --   --   --   --   --   --   ALT 16 16  --   --   --   --   --   --    Liver Function Tests:  Recent Labs Lab 04/21/13 1330 04/22/13 0156 04/25/13 0425 04/26/13 0900 04/27/13 0434  AST 38* 38*  --   --   --   ALT 16 16  --   --   --   ALKPHOS 163* 175*  --   --   --   BILITOT 0.4 0.4  --   --   --   PROT 7.0 6.5  --   --   --   ALBUMIN 1.8* 1.6* 1.8* 1.6* 1.7*   No results found for this basename: LIPASE, AMYLASE,  in the last 168 hours No results found for this basename: AMMONIA,  in the last 168 hours CBC:  Recent Labs Lab 04/21/13 1330  04/23/13 0400  04/25/13 2230 04/26/13 0414  04/26/13 1635 04/27/13 0434 04/27/13 0915  WBC 13.3*  < > 10.2  < > 13.3* 11.4* 12.3* 13.7* 13.7*  NEUTROABS 10.8*  --  8.0*  --   --   --   --   --   --   HGB 7.1*  < > 7.6*  < > 7.2* 7.7* 8.1* 7.3* 8.1*  HCT 22.9*  < > 24.5*  < > 23.7* 25.3* 27.0* 24.2* 26.6*  MCV 74.4*  < > 74.2*  < > 75.2* 75.7* 76.1* 74.9* 76.9*  PLT 364  < > 302  < > 289 236 286 265 302  < > = values in this interval not displayed. Cardiac Enzymes:  Recent Labs Lab 04/21/13 1330  TROPONINI <0.30   CBG:  Recent Labs Lab 04/26/13 0601 04/26/13  1113 04/26/13 1615 04/26/13 2141 04/27/13 0704  GLUCAP 122* 125* 125* 177* 213*    Iron Studies: No results found for this basename: IRON, TIBC, TRANSFERRIN, FERRITIN,  in the last 72 hours Studies/Results: Dg Chest Port 1 View  04/27/2013   CLINICAL DATA:  Increasing shortness of breath question fluid overload  EXAM: PORTABLE CHEST - 1 VIEW  COMPARISON:  Portable exam 0847 hr compared to 04/24/2013  FINDINGS: Enlargement of cardiac silhouette with pulmonary vascular congestion.  Diffuse interstitial infiltrates consistent with pulmonary edema.  Additional atelectasis versus consolidation left lower lobe.  No pleural effusion or pneumothorax.  IMPRESSION: CHF with atelectasis versus consolidation in left lower lobe.   Electronically Signed   By: Lavonia Dana M.D.   On: 04/27/2013 09:14   Ir Kypho Vertebral Thoracic Augmentation  04/26/2013   CLINICAL DATA:  Prostate carcinoma. Multiple axial lesions on thoracic and lumbar MRI. Painful compression fracture deformity of T12.  EXAM: 1.  DEEP BONE BIOPSY THORACIC T12 UNDER FLUOROSCOPY  2.  KYPHOPLASTY AT THORACIC T12  TECHNIQUE: The procedure, risks (including but not limited to bleeding, infection, organ damage), benefits, and alternatives were explained to the patient. Questions regarding the procedure were encouraged and answered. The patient understands and consents to the procedure.  The patient was placed prone on the  fluoroscopic table. The skin overlying the lower thoracic region was then prepped and draped in the usual sterile fashion. Maximal barrier sterile technique was utilized including caps, mask, sterile gowns, sterile gloves, sterile drape, hand hygiene and skin antiseptic.  Intravenous Fentanyl and Versed were administered as conscious sedation during continuous cardiorespiratory monitoring by the radiology RN, with a total moderate sedation time of40 minutes.  As antibiotic prophylaxis, !cefazolin 1 gram! was ordered pre-procedure and administered intravenously within !one hour! of incision.  The thoracic compression deformity of T12 was localized.The right pedicle at T12 was then infiltrated with 1% lidocaine followed by the advancement of a Kyphon trocar needle through the right pedicle into the posterior one-third. The trocar was removed and a coaxial core bone biopsy was obtained. Subsequently, The osteo drill was advanced to the anterior third of the vertebral body. The osteo drill was retracted. Through the working cannula, a Kyphon inflatable bone tamp 15 x 3 was advanced and positioned with the distal marker 5 mm from the anterior aspect of the cortex. Crossing of the midline was not seen on the AP projection. At this time, the balloon was expanded using contrast via a Kyphon inflation syringe device via micro tubing.  In similar fashion, the left pedicle was infiltrated with 1% lidocaine followed by the advancement of a second Kyphon trocar needle through the left pedicle into the posterior third of the vertebral body. Coaxial core deep bone biopsy sample obtained. The osteo drill was coaxially advanced to the anterior right third. The osteo drill was exchanged for a Kyphon inflatable bone tamp 15x 3, advanced to the 5 mm of the anterior aspect of the cortex. The balloon was then expanded using contrast as above.  Inflations were continued until there was near apposition across the midline and approaching  the superior and the inferior end plates.  At this time, methylmethacrylate mixture was reconstituted in the Kyphon bone mixing device system. This was then loaded into the delivery mechanism, attached to Kyphon bone fillers.  The balloons were deflated and removed followed by the instillation of methylmethacrylate mixture with excellent filling in the AP and lateral projections. No extravasation was noted in the disk  spaces or posteriorly into the spinal canal. No epidural venous contamination was seen.  The patient tolerated the procedure well. There were no acute complications. The working cannulae and the bone filler were then retrieved and removed.  IMPRESSION: 1. Deep bone core biopsy of thoracic T12 lesion. 2. Status post vertebral body augmentation using balloon kyphoplasty at thoracic T12 as described without event.   Electronically Signed   By: Arne Cleveland M.D.   On: 04/26/2013 18:14   . antiseptic oral rinse  15 mL Mouth Rinse BID  . atorvastatin  80 mg Oral q1800  . collagenase   Topical Daily  . feeding supplement (GLUCERNA SHAKE)  237 mL Oral BID BM  . furosemide  80 mg Intravenous Q12H  . hydrALAZINE  25 mg Oral 3 times per day  . insulin aspart  0-9 Units Subcutaneous TID WC  . insulin glargine  15 Units Subcutaneous QHS  . isosorbide dinitrate  40 mg Oral BID  . levothyroxine  150 mcg Oral QAC breakfast  . metoprolol  100 mg Oral BID  . mycophenolate  720 mg Oral BID  . pantoprazole  40 mg Oral Daily  . sirolimus  2 mg Oral q morning - 10a  . sodium chloride  3 mL Intravenous Q12H    BMET    Component Value Date/Time   NA 139 04/27/2013 0434   K 4.0 04/27/2013 0434   CL 90* 04/27/2013 0434   CO2 25 04/27/2013 0434   GLUCOSE 269* 04/27/2013 0434   BUN 52* 04/27/2013 0434   CREATININE 2.44* 04/27/2013 0434   CALCIUM 6.7* 04/27/2013 0434   GFRNONAA 27* 04/27/2013 0434   GFRAA 31* 04/27/2013 0434   CBC    Component Value Date/Time   WBC 13.7* 04/27/2013 0915   RBC 3.46*  04/27/2013 0915   RBC 3.35* 03/27/2013 0930   HGB 8.1* 04/27/2013 0915   HCT 26.6* 04/27/2013 0915   PLT 302 04/27/2013 0915   MCV 76.9* 04/27/2013 0915   MCH 23.4* 04/27/2013 0915   MCHC 30.5 04/27/2013 0915   RDW 22.0* 04/27/2013 0915   LYMPHSABS 1.7 04/23/2013 0400   MONOABS 0.4 04/23/2013 0400   EOSABS 0.1 04/23/2013 0400   BASOSABS 0.0 04/23/2013 0400     Assessment/Plan:  1. 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 Scr stable, cont with rapamune/myfortic (tremors may be from elevated rapamune level, will check as he had been on dilt for a few days) 2. pulm edema/volume excess- improving clinically and diuresing but unsure why he has had such a quick readmission.  1. Required extra dose of IV lasix last pm with improvement of symptoms. Agree with Imdue and consider Cardiology re-eval 2. Slowly diuresing with expected increase in Scr.  Cont to follow 3. CHF systolic (EF 18%) 4. Prostate Ca - ?spinal mets s/p CT guided biopsy and await results. 5. CAD s/p PTA with stent, + myoview but no cardiac cath due to CKD 6. Hypocalcemia- resolved. 7. SHP and hyperphosphatemia- will start Calcium acetate and follow 8. Severe protein malnutrition- will need protein supplementation 9. Anemia- no on ESA and has h/o prostate CA. Transfuse prn. 10. Atrial tachycardia- on po MTP and dilt 11. Deconditioning- may benefit from PT/OT eval and possible SNF vs. CIR stay 12.   [place] A

## 2013-04-27 NOTE — Clinical Social Work Psychosocial (Signed)
Clinical Social Work Department BRIEF PSYCHOSOCIAL ASSESSMENT 04/27/2013  Patient:  Joshua [name],Joshua [name]     Account Number:  [id number]     Admit date:  04/21/2013  Clinical Social Worker:  Frederico Hamman  Date/Time:  04/27/2013 12:11 PM  Referred by:  Physician  Date Referred:  04/27/2013 Referred for  SNF Placement   Other Referral:   Interview type:  Patient Other interview type:   Patient has a devoted wife, Joshua [name] who was at the bedside. (H) [phone] (C) [phone].    PSYCHOSOCIAL DATA Living Status:  WIFE Admitted from facility:   Level of care:   Primary support name:  Joshua [name] Primary support relationship to patient:  SPOUSE Degree of support available:   Wife was at the bedside with patient.    CURRENT CONCERNS Current Concerns  Post-Acute Placement   Other Concerns:    SOCIAL WORK ASSESSMENT / PLAN CSW intern introduce self to patient and wife. Patient seemed agitated a little. Patient's wife Joshua [name] is very devoted. CSW intern discussed with patient and Joshua [name] that the patient would need rehab at a skilled nursing facility. Patient does not want rehab, he wants to go home. Joshua [name] advised the patient she would not be able to care for him at home. Patient then, agreed to skilled nursing facility. CSW intern explained to [facility] and patient that he would only be at the nursing facility for short term and would be able to leave when returned to baseline. Patient and Joshua [name] have a list of skilled nursing facility in [facility]. CSW intern advised patient and Joshua [name] on SNF search process and answered questions Joshua [name] had and encouraged Joshua [name] to visit facilites. Patient and Joshua [name] has CSW contact information for any additional needs.   Assessment/plan status:  Psychosocial Support/Ongoing Assessment of Needs Other assessment/ plan:   Information/referral to community resources:   SNF list.    PATIENT'S/FAMILY'S  RESPONSE TO PLAN OF CARE: Patient seemed flustered and wife, Joshua [name] was there to calm patient and remind the patient that she was there for him and everything would be ok. Joshua [name] was kind and thankful for the social work Public affairs consultant.  Joshua [name] and patient have CSW contact information for any inquires.       Wellington Hampshire, CSW Intern.

## 2013-04-28 ENCOUNTER — Inpatient Hospital Stay (HOSPITAL_COMMUNITY)
Admission: RE | Admit: 2013-04-28 | Discharge: 2013-04-28 | Disposition: A | Discharge status: Home / Self Care | Payer: Medicare Other | Source: Ambulatory Visit | Attending: Urology | Admitting: Urology

## 2013-04-28 ENCOUNTER — Encounter (HOSPITAL_COMMUNITY)
Admission: EM | Disposition: A | Discharge status: Hospice | Payer: Self-pay | Source: Home / Self Care | Attending: Internal Medicine

## 2013-04-28 ENCOUNTER — Inpatient Hospital Stay (HOSPITAL_COMMUNITY): Payer: Medicare Other

## 2013-04-28 ENCOUNTER — Ambulatory Visit (HOSPITAL_COMMUNITY): Admission: RE | Admit: 2013-04-28 | Payer: Medicare Other | Source: Ambulatory Visit

## 2013-04-28 DIAGNOSIS — I509 Heart failure, unspecified: Secondary | ICD-10-CM

## 2013-04-28 DIAGNOSIS — L8995 Pressure ulcer of unspecified site, unstageable: Secondary | ICD-10-CM

## 2013-04-28 DIAGNOSIS — J189 Pneumonia, unspecified organism: Secondary | ICD-10-CM

## 2013-04-28 DIAGNOSIS — L89109 Pressure ulcer of unspecified part of back, unspecified stage: Secondary | ICD-10-CM

## 2013-04-28 DIAGNOSIS — N189 Chronic kidney disease, unspecified: Secondary | ICD-10-CM

## 2013-04-28 HISTORY — PX: RIGHT HEART CATHETERIZATION: SHX5447

## 2013-04-28 LAB — POCT I-STAT 3, ART BLOOD GAS (G3+)
ACID-BASE EXCESS: 6 mmol/L — AB (ref 0.0–2.0)
Bicarbonate: 30 mEq/L — ABNORMAL HIGH (ref 20.0–24.0)
O2 SAT: 98 %
TCO2: 31 mmol/L (ref 0–100)
pCO2 arterial: 38.3 mmHg (ref 35.0–45.0)
pH, Arterial: 7.502 — ABNORMAL HIGH (ref 7.350–7.450)
pO2, Arterial: 88 mmHg (ref 80.0–100.0)

## 2013-04-28 LAB — CBC
HCT: 26.1 % — ABNORMAL LOW (ref 39.0–52.0)
HCT: 23.5 % — ABNORMAL LOW (ref 39.0–52.0)
Hemoglobin: 7.9 g/dL — ABNORMAL LOW (ref 13.0–17.0)
Hemoglobin: 7.2 g/dL — ABNORMAL LOW (ref 13.0–17.0)
MCH: 23.3 pg — ABNORMAL LOW (ref 26.0–34.0)
MCH: 23.1 pg — AB (ref 26.0–34.0)
MCHC: 30.3 g/dL (ref 30.0–36.0)
MCHC: 30.6 g/dL (ref 30.0–36.0)
MCV: 77 fL — ABNORMAL LOW (ref 78.0–100.0)
MCV: 75.3 fL — ABNORMAL LOW (ref 78.0–100.0)
PLATELETS: 266 10*3/uL (ref 150–400)
PLATELETS: 239 10*3/uL (ref 150–400)
RBC: 3.39 MIL/uL — ABNORMAL LOW (ref 4.22–5.81)
RBC: 3.12 MIL/uL — AB (ref 4.22–5.81)
RDW: 22.1 % — ABNORMAL HIGH (ref 11.5–15.5)
RDW: 21.7 % — ABNORMAL HIGH (ref 11.5–15.5)
WBC: 13.7 10*3/uL — ABNORMAL HIGH (ref 4.0–10.5)
WBC: 13.1 10*3/uL — ABNORMAL HIGH (ref 4.0–10.5)

## 2013-04-28 LAB — POCT I-STAT 3, VENOUS BLOOD GAS (G3P V)
ACID-BASE EXCESS: 4 mmol/L — AB (ref 0.0–2.0)
Acid-Base Excess: 4 mmol/L — ABNORMAL HIGH (ref 0.0–2.0)
Acid-Base Excess: 5 mmol/L — ABNORMAL HIGH (ref 0.0–2.0)
BICARBONATE: 29.8 meq/L — AB (ref 20.0–24.0)
Bicarbonate: 28.4 mEq/L — ABNORMAL HIGH (ref 20.0–24.0)
Bicarbonate: 28.6 mEq/L — ABNORMAL HIGH (ref 20.0–24.0)
O2 Saturation: 50 %
O2 Saturation: 53 %
O2 Saturation: 61 %
PCO2 VEN: 41.7 mmHg — AB (ref 45.0–50.0)
PCO2 VEN: 43.3 mmHg — AB (ref 45.0–50.0)
PH VEN: 7.446 — AB (ref 7.250–7.300)
TCO2: 31 mmol/L (ref 0–100)
TCO2: 30 mmol/L (ref 0–100)
TCO2: 30 mmol/L (ref 0–100)
pCO2, Ven: 41.6 mmHg — ABNORMAL LOW (ref 45.0–50.0)
pH, Ven: 7.442 — ABNORMAL HIGH (ref 7.250–7.300)
pH, Ven: 7.445 — ABNORMAL HIGH (ref 7.250–7.300)
pO2, Ven: 26 mmHg — CL (ref 30.0–45.0)
pO2, Ven: 27 mmHg — CL (ref 30.0–45.0)
pO2, Ven: 31 mmHg (ref 30.0–45.0)

## 2013-04-28 LAB — RENAL FUNCTION PANEL
Albumin: 1.7 g/dL — ABNORMAL LOW (ref 3.5–5.2)
BUN: 56 mg/dL — ABNORMAL HIGH (ref 6–23)
CO2: 27 meq/L (ref 19–32)
Calcium: 7 mg/dL — ABNORMAL LOW (ref 8.4–10.5)
Chloride: 92 mEq/L — ABNORMAL LOW (ref 96–112)
Creatinine, Ser: 2.6 mg/dL — ABNORMAL HIGH (ref 0.50–1.35)
GFR, EST AFRICAN AMERICAN: 29 mL/min — AB (ref >90)
GFR, EST NON AFRICAN AMERICAN: 25 mL/min — AB (ref >90)
GLUCOSE: 137 mg/dL — AB (ref 70–99)
PHOSPHORUS: 6.4 mg/dL — AB (ref 2.3–4.6)
POTASSIUM: 4.5 meq/L (ref 3.7–5.3)
Sodium: 142 mEq/L (ref 137–147)

## 2013-04-28 LAB — LACTATE DEHYDROGENASE: LDH: 3700 U/L — AB (ref 94–250)

## 2013-04-28 LAB — GLUCOSE, CAPILLARY
GLUCOSE-CAPILLARY: 138 mg/dL — AB (ref 70–99)
GLUCOSE-CAPILLARY: 200 mg/dL — AB (ref 70–99)
Glucose-Capillary: 228 mg/dL — ABNORMAL HIGH (ref 70–99)

## 2013-04-28 LAB — PROTEIN, TOTAL: Total Protein: 6.7 g/dL (ref 6.0–8.3)

## 2013-04-28 LAB — CREATININE, SERUM
Creatinine, Ser: 2.8 mg/dL — ABNORMAL HIGH (ref 0.50–1.35)
GFR calc non Af Amer: 23 mL/min — ABNORMAL LOW (ref >90)
GFR, EST AFRICAN AMERICAN: 26 mL/min — AB (ref >90)

## 2013-04-28 SURGERY — RIGHT HEART CATH
Anesthesia: LOCAL

## 2013-04-28 MED ORDER — VANCOMYCIN HCL IN DEXTROSE 1-5 GM/200ML-% IV SOLN
1000.0000 mg | INTRAVENOUS | Status: DC
  Filled 2013-04-28: qty 200

## 2013-04-28 MED ORDER — SODIUM CHLORIDE 0.9 % IV SOLN: 250.0000 mL | INTRAVENOUS | Status: DC | PRN

## 2013-04-28 MED ORDER — PIPERACILLIN-TAZOBACTAM 3.375 G IVPB
3.3750 g | Freq: Three times a day (TID) | INTRAVENOUS | Status: DC
  Administered 2013-04-29 (×2): 3.375 g via INTRAVENOUS
  Filled 2013-04-28 (×5): qty 50

## 2013-04-28 MED ORDER — ASPIRIN 81 MG PO CHEW: 81.0000 mg | CHEWABLE_TABLET | ORAL | Status: DC

## 2013-04-28 MED ORDER — DIPHENHYDRAMINE HCL 50 MG/ML IJ SOLN
INTRAMUSCULAR | Status: AC
  Filled 2013-04-28: qty 1

## 2013-04-28 MED ORDER — SODIUM CHLORIDE 0.9 % IJ SOLN: 3.0000 mL | INTRAMUSCULAR | Status: DC | PRN

## 2013-04-28 MED ORDER — SODIUM CHLORIDE 0.9 % IJ SOLN
3.0000 mL | Freq: Two times a day (BID) | INTRAMUSCULAR | Status: DC
  Administered 2013-04-29: 3 mL via INTRAVENOUS

## 2013-04-28 MED ORDER — SODIUM CHLORIDE 0.9 % IV SOLN
1.0000 g | Freq: Once | INTRAVENOUS | Status: AC
  Administered 2013-04-28: 1 g via INTRAVENOUS
  Filled 2013-04-28: qty 10

## 2013-04-28 MED ORDER — FUROSEMIDE 40 MG PO TABS
40.0000 mg | ORAL_TABLET | Freq: Every day | ORAL | Status: DC
  Administered 2013-04-29: 40 mg via ORAL
  Filled 2013-04-28 (×2): qty 1

## 2013-04-28 MED ORDER — LIDOCAINE HCL (PF) 1 % IJ SOLN
INTRAMUSCULAR | Status: AC
  Filled 2013-04-28: qty 5

## 2013-04-28 MED ORDER — SODIUM CHLORIDE 0.9 % IV SOLN
1750.0000 mg | Freq: Once | INTRAVENOUS | Status: AC
  Administered 2013-04-28: 1750 mg via INTRAVENOUS
  Filled 2013-04-28: qty 1750

## 2013-04-28 MED ORDER — MIDAZOLAM HCL 2 MG/2ML IJ SOLN
1.0000 mg | Freq: Once | INTRAMUSCULAR | Status: AC
  Administered 2013-04-28: 1 mg via INTRAVENOUS

## 2013-04-28 MED ORDER — MIDAZOLAM HCL 2 MG/2ML IJ SOLN
INTRAMUSCULAR | Status: AC
Start: 2013-04-28 — End: 2013-04-29
  Filled 2013-04-28: qty 2

## 2013-04-28 MED ORDER — SODIUM CHLORIDE 0.9 % IV SOLN: INTRAVENOUS | Status: DC

## 2013-04-28 MED ORDER — MIDAZOLAM HCL 2 MG/2ML IJ SOLN
INTRAMUSCULAR | Status: AC
  Filled 2013-04-28: qty 2

## 2013-04-28 MED ORDER — SODIUM CHLORIDE 0.9 % IJ SOLN: 3.0000 mL | Freq: Two times a day (BID) | INTRAMUSCULAR | Status: DC

## 2013-04-28 MED ORDER — INSULIN ASPART 100 UNIT/ML ~~LOC~~ SOLN
0.0000 [IU] | Freq: Three times a day (TID) | SUBCUTANEOUS | Status: DC
  Administered 2013-04-28: 2 [IU] via SUBCUTANEOUS
  Administered 2013-04-28: 1 [IU] via SUBCUTANEOUS
  Administered 2013-04-29 – 2013-04-30 (×5): 2 [IU] via SUBCUTANEOUS
  Administered 2013-05-02 – 2013-05-03 (×3): 3 [IU] via SUBCUTANEOUS
  Administered 2013-05-03: 2 [IU] via SUBCUTANEOUS
  Administered 2013-05-06: 1 [IU] via SUBCUTANEOUS
  Administered 2013-05-06: 2 [IU] via SUBCUTANEOUS
  Administered 2013-05-08 – 2013-05-11 (×2): 1 [IU] via SUBCUTANEOUS
  Administered 2013-05-11: 4 [IU] via SUBCUTANEOUS
  Administered 2013-05-13: 17:00:00 via SUBCUTANEOUS
  Administered 2013-05-13 – 2013-05-14 (×3): 2 [IU] via SUBCUTANEOUS
  Administered 2013-05-14: 3 [IU] via SUBCUTANEOUS
  Administered 2013-05-14: 2 [IU] via SUBCUTANEOUS

## 2013-04-28 MED ORDER — ENOXAPARIN SODIUM 30 MG/0.3ML ~~LOC~~ SOLN
30.0000 mg | SUBCUTANEOUS | Status: DC
  Administered 2013-04-29 – 2013-05-11 (×12): 30 mg via SUBCUTANEOUS
  Filled 2013-04-28 (×15): qty 0.3

## 2013-04-28 MED ORDER — HEPARIN (PORCINE) IN NACL 2-0.9 UNIT/ML-% IJ SOLN
INTRAMUSCULAR | Status: AC
  Filled 2013-04-28: qty 1000

## 2013-04-28 MED ORDER — LIDOCAINE HCL (PF) 1 % IJ SOLN
INTRAMUSCULAR | Status: AC
  Filled 2013-04-28: qty 30

## 2013-04-28 NOTE — Progress Notes (Signed)
Pt transferred to 2h10; report to [facility], RN; pt c/o [facility] with wheezing noted; albuterol given PRN

## 2013-04-28 NOTE — H&P (View-Only) (Signed)
Reason for Consult: CHF Referring Physician:   [name] is an 63 y.o. male.  HPI:   Joshua [name] is a 63 y.o. male with a history of CAD, HTN ,mild AS, dyslipidemia, PAD s/p CEA and fem-pop and  ESRD s/p Renal transplant with CKD now Cr 2.2-2.5. We are consulted for help with HF management.   ECHO EF 30-35%   IV lasix increased but diuresis is poor. Cr worsening. Remains SOB. Underwent T12 biopsy (? Prostate cancer) and kyphoplasty yesterday.   Physical exam Filed Vitals:   04/28/13 0530 04/28/13 0745 04/28/13 1100 04/28/13 1200  BP: 145/99 113/63 159/71 136/99  Pulse:  87 90 97  Temp:  98 F (36.7 C)    TempSrc:  Oral    Resp:  _0 Height:      Weight:      SpO2:  100% 100% 95%    General:  Chronically ill appearing appearing. No resp difficulty HEENT: normal x for poor dentition Neck: supple. no JVP 10cm Carotids 2+ bilat; no bruits. No lymphadenopathy or thryomegaly appreciated. Cor: PMI nonpalpable.  Regular rate & rhythm. Distant  No obvious rubs, gallops or murmurs. Lungs: +rhonchi dull at bases Abdomen: soft, nontender, + distended. No hepatosplenomegaly. No bruits or masses. Good bowel sounds. 2+ presacral edema Extremities: no cyanosis, clubbing, rash, tr edema + LUE AVF Neuro: alert & orientedx3, cranial nerves grossly intact. moves all 4 extremities w/o difficulty. Affect pleasant    Results for orders placed during the hospital encounter of 04/21/13 (from the past 48 hour(s))  GLUCOSE, CAPILLARY     Status: Abnormal   Collection Time    04/26/13  4:15 PM      Result Value Ref Range   Glucose-Capillary 125 (*) 70 - 99 mg/dL   Comment 1 Documented in Chart     Comment 2 Notify RN    CBC     Status: Abnormal   Collection Time    04/26/13  4:35 PM      Result Value Ref Range   WBC 12.3 (*) 4.0 - 10.5 K/uL   RBC 3.55 (*) 4.22 - 5.81 MIL/uL   Hemoglobin 8.1 (*) 13.0 - 17.0 g/dL   HCT 27.0 (*) 39.0 - 52.0 %   MCV 76.1 (*) 78.0 - 100.0 fL   MCH 22.8 (*) 26.0 - 34.0 pg   MCHC 30.0  30.0 - 36.0 g/dL   RDW 21.5 (*) 11.5 - 15.5 %   Platelets 286  150 - 400 K/uL  GLUCOSE, CAPILLARY     Status: Abnormal   Collection Time    04/26/13  9:41 PM      Result Value Ref Range   Glucose-Capillary 177 (*) 70 - 99 mg/dL  CALCIUM, IONIZED     Status: Abnormal   Collection Time    04/26/13 11:11 PM      Result Value Ref Range   Calcium, Ion 0.79 (*) 1.13 - 1.30 mmol/L   Comment: Performed at [facility]  CBC     Status: Abnormal   Collection Time    04/27/13  4:34 AM      Result Value Ref Range   WBC 13.7 (*) 4.0 - 10.5 K/uL   RBC 3.23 (*) 4.22 - 5.81 MIL/uL   Hemoglobin 7.3 (*) 13.0 - 17.0 g/dL   HCT 24.2 (*) 39.0 - 52.0 %   MCV 74.9 (*) 78.0 - 100.0 fL   MCH 22.6 (*) 26.0 - 34.0 pg  MCHC 30.2  30.0 - 36.0 g/dL   RDW 21.5 (*) 11.5 - 15.5 %   Platelets 265  150 - 400 K/uL  RENAL FUNCTION PANEL     Status: Abnormal   Collection Time    04/27/13  4:34 AM      Result Value Ref Range   Sodium 139  137 - 147 mEq/L   Potassium 4.0  3.7 - 5.3 mEq/L   Chloride 90 (*) 96 - 112 mEq/L   CO2 25  19 - 32 mEq/L   Glucose, Bld 269 (*) 70 - 99 mg/dL   BUN 52 (*) 6 - 23 mg/dL   Creatinine, Ser 2.44 (*) 0.50 - 1.35 mg/dL   Calcium 6.7 (*) 8.4 - 10.5 mg/dL   Phosphorus 6.3 (*) 2.3 - 4.6 mg/dL   Albumin 1.7 (*) 3.5 - 5.2 g/dL   GFR calc non Af Amer 27 (*) >90 mL/min   GFR calc Af Amer 31 (*) >90 mL/min   Comment: (NOTE)     The eGFR has been calculated using the CKD EPI equation.     This calculation has not been validated in all clinical situations.     eGFR's persistently <90 mL/min signify possible Chronic Kidney     Disease.  HIV ANTIBODY (ROUTINE TESTING)     Status: None   Collection Time    04/27/13  4:34 AM      Result Value Ref Range   HIV NON REACTIVE  NON REACTIVE   Comment: Performed at [place], CAPILLARY     Status: Abnormal   Collection Time    04/27/13  7:04 AM      Result Value Ref  Range   Glucose-Capillary 213 (*) 70 - 99 mg/dL  CBC     Status: Abnormal   Collection Time    04/27/13  9:15 AM      Result Value Ref Range   WBC 13.7 (*) 4.0 - 10.5 K/uL   RBC 3.46 (*) 4.22 - 5.81 MIL/uL   Hemoglobin 8.1 (*) 13.0 - 17.0 g/dL   HCT 26.6 (*) 39.0 - 52.0 %   MCV 76.9 (*) 78.0 - 100.0 fL   MCH 23.4 (*) 26.0 - 34.0 pg   MCHC 30.5  30.0 - 36.0 g/dL   RDW 22.0 (*) 11.5 - 15.5 %   Platelets 302  150 - 400 K/uL  MAGNESIUM     Status: None   Collection Time    04/27/13  9:15 AM      Result Value Ref Range   Magnesium 2.0  1.5 - 2.5 mg/dL  GLUCOSE, CAPILLARY     Status: Abnormal   Collection Time    04/27/13 11:01 AM      Result Value Ref Range   Glucose-Capillary 180 (*) 70 - 99 mg/dL  CBC     Status: Abnormal   Collection Time    04/27/13  4:55 PM      Result Value Ref Range   WBC 14.7 (*) 4.0 - 10.5 K/uL   Comment: WHITE COUNT CONFIRMED ON SMEAR   RBC 3.23 (*) 4.22 - 5.81 MIL/uL   Hemoglobin 7.4 (*) 13.0 - 17.0 g/dL   HCT 24.4 (*) 39.0 - 52.0 %   MCV 75.5 (*) 78.0 - 100.0 fL   MCH 22.9 (*) 26.0 - 34.0 pg   MCHC 30.3  30.0 - 36.0 g/dL   RDW 21.7 (*) 11.5 - 15.5 %   Platelets 247  150 -  400 K/uL  TROPONIN I     Status: None   Collection Time    04/27/13  4:55 PM      Result Value Ref Range   Troponin I <0.30  <0.30 ng/mL   Comment:            Due to the release kinetics of cTnI,     a negative result within the first hours     of the onset of symptoms does not rule out     myocardial infarction with certainty.     If myocardial infarction is still suspected,     repeat the test at appropriate intervals.  GLUCOSE, CAPILLARY     Status: Abnormal   Collection Time    04/27/13  5:00 PM      Result Value Ref Range   Glucose-Capillary 235 (*) 70 - 99 mg/dL  GLUCOSE, CAPILLARY     Status: Abnormal   Collection Time    04/27/13  9:18 PM      Result Value Ref Range   Glucose-Capillary 168 (*) 70 - 99 mg/dL  CBC     Status: Abnormal   Collection Time     04/28/13  4:40 AM      Result Value Ref Range   WBC 13.7 (*) 4.0 - 10.5 K/uL   RBC 3.39 (*) 4.22 - 5.81 MIL/uL   Hemoglobin 7.9 (*) 13.0 - 17.0 g/dL   HCT 26.1 (*) 39.0 - 52.0 %   MCV 77.0 (*) 78.0 - 100.0 fL   MCH 23.3 (*) 26.0 - 34.0 pg   MCHC 30.3  30.0 - 36.0 g/dL   RDW 22.1 (*) 11.5 - 15.5 %   Platelets 266  150 - 400 K/uL   Comment: REPEATED TO VERIFY     PLATELET COUNT CONFIRMED BY SMEAR  RENAL FUNCTION PANEL     Status: Abnormal   Collection Time    04/28/13  6:55 AM      Result Value Ref Range   Sodium 142  137 - 147 mEq/L   Potassium 4.5  3.7 - 5.3 mEq/L   Comment: HEMOLYSIS AT THIS LEVEL MAY AFFECT RESULT   Chloride 92 (*) 96 - 112 mEq/L   CO2 27  19 - 32 mEq/L   Glucose, Bld 137 (*) 70 - 99 mg/dL   BUN 56 (*) 6 - 23 mg/dL   Creatinine, Ser 2.60 (*) 0.50 - 1.35 mg/dL   Calcium 7.0 (*) 8.4 - 10.5 mg/dL   Phosphorus 6.4 (*) 2.3 - 4.6 mg/dL   Albumin 1.7 (*) 3.5 - 5.2 g/dL   GFR calc non Af Amer 25 (*) >90 mL/min   GFR calc Af Amer 29 (*) >90 mL/min   Comment: (NOTE)     The eGFR has been calculated using the CKD EPI equation.     This calculation has not been validated in all clinical situations.     eGFR's persistently <90 mL/min signify possible Chronic Kidney     Disease.  GLUCOSE, CAPILLARY     Status: Abnormal   Collection Time    04/28/13  7:48 AM      Result Value Ref Range   Glucose-Capillary 138 (*) 70 - 99 mg/dL    Dg Chest 2 View  04/27/2013   CLINICAL DATA:  Shortness of breath, dyspnea, weakness  EXAM: CHEST  2 VIEW  COMPARISON:  DG CHEST 1V PORT dated 04/27/2013; DG CHEST 2 VIEW dated 04/24/2013; DG CHEST 2 VIEW dated 04/22/2013  FINDINGS: Grossly unchanged enlarged cardiac silhouette and mediastinal contours. The pulmonary vasculature is less distinct on the present examination with cephalization of flow. Worsening perihilar and bilateral medial basilar heterogeneous/consolidative opacities, left greater than right. Interval development of small  bilateral pleural effusions. No pneumothorax. Grossly unchanged bones. Multiple surgical clips overlie the midline of the lower neck. A vascular stent overlies the inner aspect of the left upper arm. Surgical clips overlie the bilateral axilla.  IMPRESSION: Findings most suggestive of worsening pulmonary edema, small bilateral effusions and perihilar/medial basilar opacities, left greater than right, atelectasis versus infiltrate.   Electronically Signed   By: Sandi Mariscal M.D.   On: 04/27/2013 11:29   Dg Chest Port 1 View  04/27/2013   CLINICAL DATA:  Increasing shortness of breath question fluid overload  EXAM: PORTABLE CHEST - 1 VIEW  COMPARISON:  Portable exam 3557 hr compared to 04/24/2013  FINDINGS: Enlargement of cardiac silhouette with pulmonary vascular congestion.  Diffuse interstitial infiltrates consistent with pulmonary edema.  Additional atelectasis versus consolidation left lower lobe.  No pleural effusion or pneumothorax.  IMPRESSION: CHF with atelectasis versus consolidation in left lower lobe.   Electronically Signed   By: Lavonia Dana M.D.   On: 04/27/2013 09:14   Ir Kypho Vertebral Thoracic Augmentation  04/26/2013   CLINICAL DATA:  Prostate carcinoma. Multiple axial lesions on thoracic and lumbar MRI. Painful compression fracture deformity of T12.  EXAM: 1.  DEEP BONE BIOPSY THORACIC T12 UNDER FLUOROSCOPY  2.  KYPHOPLASTY AT THORACIC T12  TECHNIQUE: The procedure, risks (including but not limited to bleeding, infection, organ damage), benefits, and alternatives were explained to the patient. Questions regarding the procedure were encouraged and answered. The patient understands and consents to the procedure.  The patient was placed prone on the fluoroscopic table. The skin overlying the lower thoracic region was then prepped and draped in the usual sterile fashion. Maximal barrier sterile technique was utilized including caps, mask, sterile gowns, sterile gloves, sterile drape, hand hygiene  and skin antiseptic.  Intravenous Fentanyl and Versed were administered as conscious sedation during continuous cardiorespiratory monitoring by the radiology RN, with a total moderate sedation time of40 minutes.  As antibiotic prophylaxis, !cefazolin 1 gram! was ordered pre-procedure and administered intravenously within !one hour! of incision.  The thoracic compression deformity of T12 was localized.The right pedicle at T12 was then infiltrated with 1% lidocaine followed by the advancement of a Kyphon trocar needle through the right pedicle into the posterior one-third. The trocar was removed and a coaxial core bone biopsy was obtained. Subsequently, The osteo drill was advanced to the anterior third of the vertebral body. The osteo drill was retracted. Through the working cannula, a Kyphon inflatable bone tamp 15 x 3 was advanced and positioned with the distal marker 5 mm from the anterior aspect of the cortex. Crossing of the midline was not seen on the AP projection. At this time, the balloon was expanded using contrast via a Kyphon inflation syringe device via micro tubing.  In similar fashion, the left pedicle was infiltrated with 1% lidocaine followed by the advancement of a second Kyphon trocar needle through the left pedicle into the posterior third of the vertebral body. Coaxial core deep bone biopsy sample obtained. The osteo drill was coaxially advanced to the anterior right third. The osteo drill was exchanged for a Kyphon inflatable bone tamp 15x 3, advanced to the 5 mm of the anterior aspect of the cortex. The balloon was then expanded using contrast as  above.  Inflations were continued until there was near apposition across the midline and approaching the superior and the inferior end plates.  At this time, methylmethacrylate mixture was reconstituted in the Kyphon bone mixing device system. This was then loaded into the delivery mechanism, attached to Kyphon bone fillers.  The balloons were deflated  and removed followed by the instillation of methylmethacrylate mixture with excellent filling in the AP and lateral projections. No extravasation was noted in the disk spaces or posteriorly into the spinal canal. No epidural venous contamination was seen.  The patient tolerated the procedure well. There were no acute complications. The working cannulae and the bone filler were then retrieved and removed.  IMPRESSION: 1. Deep bone core biopsy of thoracic T12 lesion. 2. Status post vertebral body augmentation using balloon kyphoplasty at thoracic T12 as described without event.   Electronically Signed   By: Arne Cleveland M.D.   On: 04/26/2013 18:14    ROS Physical Exam    Assessment/Plan: 1) A/c renal failure s/p renal transplant 2) A/c systolic HF     --EF 02-63% 3) CAD 4) Mild AS 5) PAD  Plan:  He is diuresing poorly. Renal function getting worse. Appears to be volume overloaded on exam. Case d/w Primary team and Renal. Will proceed with swan placement to assess volume status and cardiac output  Romualdo Prosise,MD 12:26 PM

## 2013-04-28 NOTE — Procedures (Signed)
Central Venous Catheter Insertion Procedure Note Joshua [name] [id number] 1950-08-23  Procedure: Insertion of PA Catheter Indications: Assessment of intravascular volume/cardiac output  Procedure Details Consent: Risks of procedure as well as the alternatives and risks of each were explained to the (patient/caregiver).  Consent for procedure obtained. Time Out: Verified patient identification, verified procedure, site/side was marked, verified correct patient position, special equipment/implants available, medications/allergies/relevent history reviewed, required imaging and test results available.  Performed  Maximum sterile technique was used including antiseptics, cap, gloves, gown, hand hygiene, mask and sheet. Skin prep: Chlorhexidine; local anesthetic administered A 7FR venous sheath was placed in the RIJ using a modified Seldinger technique. On placing the sheath a stenosis in the venosus system was noted but we were able to get past it with some difficulty using a wire. Once the sheath was in place we attempted to feed the Swan Ganz catheter into the right heart but we again encountered the stenosis. Using fluoro guidance we identified the stenosis in the distal SVC. We used a long 0.25 wire to try to pass the stenosis but were unsuccessful despite multiple attempts.  At that point the procedure was abandoned and the sheath removed. Pressure was held on the [facility] access site.   Patient will be taken for femoral RHC later today in the cardiac cath lab.   Conclusion:  Unsuccessful Gordy Councilman placement due to stenosis in distal SVC. Plan repeat RHC attempt from femoral access in cath lab.   Britney Newstrom 04/28/2013, 1:31 PM

## 2013-04-28 NOTE — Progress Notes (Signed)
Subjective: Joshua [name] was febrile overnight to 100.71F.  Afebrile this morning after Tylenol.  He says he was able to sleep more last night.  He is feeling better this morning.  Denies SOB.  Says he at breakfast.   Objective: Vital signs in last 24 hours: Filed Vitals:   04/28/13 0400 04/28/13 0500 04/28/13 0530 04/28/13 0745  BP: 143/80  145/99 113/63  Pulse: 77   87  Temp: 98.5 F (36.9 C)   98 F (36.7 C)  TempSrc: Oral   Oral  Resp: 23   18  Height:      Weight:  82.2 kg (181 lb 3.5 oz)    SpO2: 98%   100%   Weight change: 0.734 kg (1 lb 9.9 oz)  Intake/Output Summary (Last 24 hours) at 04/28/13 1055 Last data filed at 04/28/13 0500  Gross per 24 hour  Intake    680 ml  Output    850 ml  Net   -170 ml   General: Laying flat in bed in NAD Cardiac: RRR, no rubs, murmurs or gallops Pulm: wheezing, coarse breath sounds, no crackles appreciated Abd: less distended, soft, BS present, non-tender Ext: warm and well perfused, no lower extremity edema Neuro: alert and oriented X3  Lab Results: Basic Metabolic Panel:  Recent Labs Lab 04/27/13 0434 04/27/13 0915 04/28/13 0655  NA 139  --  142  K 4.0  --  4.5  CL 90*  --  92*  CO2 25  --  27  GLUCOSE 269*  --  137*  BUN 52*  --  56*  CREATININE 2.44*  --  2.60*  CALCIUM 6.7*  --  7.0*  MG  --  2.0  --   PHOS 6.3*  --  6.4*   Liver Function Tests:  Recent Labs Lab 04/21/13 1330 04/22/13 0156  04/27/13 0434 04/28/13 0655  AST 38* 38*  --   --   --   ALT 16 16  --   --   --   ALKPHOS 163* 175*  --   --   --   BILITOT 0.4 0.4  --   --   --   PROT 7.0 6.5  --   --   --   ALBUMIN 1.8* 1.6*  < > 1.7* 1.7*  < > = values in this interval not displayed. CBC:  Recent Labs Lab 04/21/13 1330  04/23/13 0400  04/27/13 1655 04/28/13 0440  WBC 13.3*  < > 10.2  < > 14.7* 13.7*  NEUTROABS 10.8*  --  8.0*  --   --   --   HGB 7.1*  < > 7.6*  < > 7.4* 7.9*  HCT 22.9*  < > 24.5*  < > 24.4* 26.1*  MCV 74.4*  < >  74.2*  < > 75.5* 77.0*  PLT 364  < > 302  < > 247 266  < > = values in this interval not displayed.  CBG:  Recent Labs Lab 04/26/13 2141 04/27/13 0704 04/27/13 1101 04/27/13 1700 04/27/13 2118 04/28/13 0748  GLUCAP 177* 213* 180* 235* 168* 138*   Medications: I have reviewed the patient's current medications. Scheduled Meds: . antiseptic oral rinse  15 mL Mouth Rinse BID  . atorvastatin  80 mg Oral q1800  . calcium acetate  1,334 mg Oral TID WC  . collagenase   Topical Daily  . enoxaparin (LOVENOX) injection  40 mg Subcutaneous Q24H  . feeding supplement (GLUCERNA SHAKE)  237 mL  Oral BID BM  . furosemide  120 mg Intravenous Q12H  . hydrALAZINE  25 mg Oral 3 times per day  . insulin aspart  0-9 Units Subcutaneous TID WC  . insulin glargine  15 Units Subcutaneous QHS  . isosorbide dinitrate  40 mg Oral BID  . levothyroxine  150 mcg Oral QAC breakfast  . metoprolol  100 mg Oral BID  . multivitamin with minerals  1 tablet Oral Daily  . mycophenolate  720 mg Oral BID  . pantoprazole  40 mg Oral Daily  . sirolimus  2 mg Oral q morning - 10a  . sodium chloride  3 mL Intravenous Q12H   Continuous Infusions: . sodium chloride 250 mL (04/27/13 2200)   PRN Meds:.acetaminophen, albuterol, diphenhydrAMINE, oxyCODONE  Assessment/Plan: 63 year old male with a PMH of HTN, DM type 2, CAD (s/p MI and LAD stent), kidney transplantation, carotid artery stenosis (s/p CEA 08/2011), PVD (s/p fem-pop in 1980), dHF (EF 45-50% Jan 2015), hx of papillary thyroid cancer and prostate cancer who presents with complaint of edema x 3 days and dyspnea for several weeks.   Volume overload:  Likely 2/2 worsening CHF in the setting of CKD about 13ys s/p renal transplant.  Not diuresing well in past few days - net positive 193cc yesterday despite increased Lasix.  He has increased oxygen demand, now requiring 3L and tachypneic to the low 20s.  CXR shows pulmonary edema but also concerning for atelectasis vs  consolidation in LLL.  - oxygen supplementation  - incentive spirometer  - albuterol nebs q4h - continue Lasix 120mg  BID - Coreg 100mg  BID, hydralazine 10mg -->25mg  q8h; isordil 40mg  BID - Daily weights and I&Os  - right heart cath today for further evaluation  - plan for diagnostic paracentesis for pleural fluid analysis  HCAP:  Fevers, leukocytosis and possible LLL infiltrate on CXR in an immunosuppressed patient.   - oxygen supplementation - Blood cultures x 2 prior to Abx - Vancomycin and Zosyn per pharmacy - diagnostic paracentesis as above  Chest pain:  Resolved.  Acute onset of 10/10 chest pain during nebulizer treatment yesterday.  No significant EKG findings. Troponin negative.  Possibly 2/2 to pneumonia.   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 Baseline Cr 1.9-2.5. Creatine 2.24 --> 2.6.  Concern for cardiorenal syndrome given worsening creatinine.   - Continue anti-rejection meds  T10 lesion noted on prior MRI:  concerning for mets from Prostate CA.  Followed by Dr. Diona Fanti (Urology).  - T10 bone biopsy and kyphoplasty done on 04/26/13.  Acute on chronic anemia: Stable.  Hgb 7.1 on admission and tx 1u pRBCs, responded to 8.1. Hgb 7.9 this morning. No active bleeding.  - q12 CBC  Hypocalcemia: 6.3 on admission and he received calcium gluconate.  Repeat on 12/15 was 6.1 w/ albumin 1.6. Corrected Ca was 8.0. However ionized Calcium low at 0.71-->0.79. Replacing Calcium.  DM type 2: Stable. Previously on 24 units Lantus at home, but wife reported hypoglycemic episodes, so she was administering 12 units over the past few days.  Pt started on Lantus 15u on admission w/ SSI. - continue Lantus 15 units qHS and SSI   HTN:  Stable but ideally should be lower.  Hydralazine was increased yesterday.  Will monitor effect of increased hydralazine and continue to increase hydralazine and isordil as needed.  Continued lopressor, hydralazine, lasix,  isordil.  H/o prostate cancer: Question of spinal mets at last hospitalization, reportedly seen on  MRI, but negative bone scan 02/2013. Pt seen by Dr. Diona Fanti with Urology, and per his last hospital d/c, wanted the pt to be worked up as an outpatient.  - Continue Oxycodone IR 10mg  q 4h PRN - T10 biopsy done inpatient on 02/18  Unstageable pressure ulcer, sacral:  Patient aware of ulcer prior to admission.  Wound care consulted and recommendations appreciated. - continue debridement ointment and moist 2x2 - pressure redistribution pad   DVT ppx: SCDs and lovenox  Dispo: Disposition is deferred at this time, awaiting improvement of current medical problems.  Anticipated discharge in approximately 1-2 day(s).   The patient does have a current PCP (Sueanne Margarita, MD) and may need an [facility] hospital follow-up appointment after discharge.  The patient does not know have transportation limitations that hinder transportation to clinic appointments.  .Services Needed at time of discharge: Y = Yes, Blank = No PT:   OT:   RN:   Equipment:   Other:     LOS: 7 days   Joshua Maxin, DO 04/28/2013, 10:55 AM

## 2013-04-28 NOTE — Progress Notes (Signed)
Internal Medicine Attending  Date: 04/28/2013  Patient name: Joshua [name] Medical record number: [id number] Date of birth: 17-May-1950 Age: 63 y.o. Gender: male  I saw and evaluated the patient. I reviewed the resident's note by Dr. Redmond Pulling and I agree with the resident's findings and plans as documented in her progress note.  Mr. Sollenberger was seen on rounds this AM.  He noted some confusion and stable dyspnea.  He also continues to have pruritis.  Examination was notable for bilateral expiratory wheezes.  He has been given escalating doses of lasix but his urine output has not been brisk and his BUN/Cr have started to rise.  As we felt he was fluid overloaded we asked Cardiology to assess for potential cardiorenal syndrome.  They were kind enough to further assess with a right heart cath and found his LA pressure (via the PCWP) was normal and his cardiac output was more than adequate.  We will stop the aggressive diuresis and switch to oral lasix at 40 mg daily in hopes of now maintaining euvolemia at this "dry weight" of about 180.  This information is very helpful and places more credence in the possibility that the left pulmonary process may be a nosocomial pneumonia with a parapneumonic effusion.  He is scheduled to undergo an ultrasound guided left sided diagnostic thoracentesis to assess the parapneumonic effusion.  In the interim, we will follow-up on the blood cultures and continue the antibiotics.  The other outstanding issue is the possible metastatic cancer to the spine.  The biopsy demonstrated a lot of necrosis and the appearance of "Ghost cells" that were likely malignant.  Given the necrosis, Pathology could not perform further stains to better identify the source of these cells.  Last month his PSA was 18 and we are most concerned this is metastatic prostate cancer.  We will defer further therapy to Urology as an outpatient once he is more clinically stable.  Androgen deprivation therapy may  be in his future.  I am comfortable with his current status that I feel he is safe for transfer to the [facility] off of telemetry to continue current evaluation and care.

## 2013-04-28 NOTE — Interval H&P Note (Signed)
History and Physical Interval Note:  04/28/2013 3:41 PM  Joshua [name]  has presented today for surgery, with the diagnosis of Heart failure  The various methods of treatment have been discussed with the patient and family. After consideration of risks, benefits and other options for treatment, the patient has consented to  Procedure(s): RIGHT HEART CATH (N/A) as a surgical intervention .  The patient's history has been reviewed, patient examined, no change in status, stable for surgery.  I have reviewed the patient's chart and labs.  Questions were answered to the patient's satisfaction.     Daniel Bensimhon

## 2013-04-28 NOTE — Progress Notes (Signed)
Patient ID: Joshua [name], male   DOB: 09-23-1950, 63 y.o.   MRN: [id number] S:c/o itching and wants a shave, mildly confused O:BP 136/99  Pulse 97  Temp(Src) 98 F (36.7 C) (Oral)  Resp 21  Ht 5\' 11"  (1.803 m)  Wt 82.2 kg (181 lb 3.5 oz)  BMI 25.29 kg/m2  SpO2 95%  Intake/Output Summary (Last 24 hours) at 04/28/13 1224 Last data filed at 04/28/13 1000  Gross per 24 hour  Intake    890 ml  Output    350 ml  Net    540 ml   Intake/Output: I/O last 3 completed shifts: In: 1243 [P.O.:1060; I.V.:73; IV Piggyback:110] Out: 1150 [Urine:1150]  Intake/Output this shift:  Total I/O In: 210 [P.O.:160; I.V.:50] Out: -  Weight change: 0.734 kg (1 lb 9.9 oz) Gen:WD WN AAM in NAd  CVS:no rub Resp:cta with decreased BS at bases Abd:+bs, soft Ext:tr edema, LUE +T/B   Recent Labs Lab 04/21/13 1330 04/22/13 0156 04/22/13 0849 04/23/13 0400 04/24/13 1341 04/25/13 0425 04/26/13 0900 04/27/13 0434 04/28/13 0655  NA 138 139 138 141 141 142 139 139 142  K 4.5 4.5 4.4 3.7 3.7 3.4* 3.8 4.0 4.5  CL 93* 93* 92* 94* 93* 94* 92* 90* 92*  CO2 24 22 22 23 24 25 25 25 27   GLUCOSE 132* 179* 153* 141* 138* 90 105* 269* 137*  BUN 45* 47* 48* 52* 48* 46* 46* 52* 56*  CREATININE 2.24* 2.27* 2.32* 2.38* 2.22* 2.19* 2.25* 2.44* 2.60*  ALBUMIN 1.8* 1.6*  --   --   --  1.8* 1.6* 1.7* 1.7*  CALCIUM 6.1* 6.0* 6.1* 6.0* 6.7* 6.6* 6.5* 6.7* 7.0*  PHOS  --   --   --   --   --  5.5* 5.7* 6.3* 6.4*  AST 38* 38*  --   --   --   --   --   --   --   ALT 16 16  --   --   --   --   --   --   --    Liver Function Tests:  Recent Labs Lab 04/21/13 1330 04/22/13 0156  04/26/13 0900 04/27/13 0434 04/28/13 0655  AST 38* 38*  --   --   --   --   ALT 16 16  --   --   --   --   ALKPHOS 163* 175*  --   --   --   --   BILITOT 0.4 0.4  --   --   --   --   PROT 7.0 6.5  --   --   --   --   ALBUMIN 1.8* 1.6*  < > 1.6* 1.7* 1.7*  < > = values in this interval not displayed. No results found for this basename:  LIPASE, AMYLASE,  in the last 168 hours No results found for this basename: AMMONIA,  in the last 168 hours CBC:  Recent Labs Lab 04/21/13 1330  04/23/13 0400  04/26/13 1635 04/27/13 0434 04/27/13 0915 04/27/13 1655 04/28/13 0440  WBC 13.3*  < > 10.2  < > 12.3* 13.7* 13.7* 14.7* 13.7*  NEUTROABS 10.8*  --  8.0*  --   --   --   --   --   --   HGB 7.1*  < > 7.6*  < > 8.1* 7.3* 8.1* 7.4* 7.9*  HCT 22.9*  < > 24.5*  < > 27.0* 24.2* 26.6* 24.4* 26.1*  MCV 74.4*  < >  74.2*  < > 76.1* 74.9* 76.9* 75.5* 77.0*  PLT 364  < > 302  < > 286 265 302 247 266  < > = values in this interval not displayed. Cardiac Enzymes:  Recent Labs Lab 04/21/13 1330 04/27/13 1655  TROPONINI <0.30 <0.30   CBG:  Recent Labs Lab 04/27/13 0704 04/27/13 1101 04/27/13 1700 04/27/13 2118 04/28/13 0748  GLUCAP 213* 180* 235* 168* 138*    Iron Studies: No results found for this basename: IRON, TIBC, TRANSFERRIN, FERRITIN,  in the last 72 hours Studies/Results: Dg Chest 2 View  04/27/2013   CLINICAL DATA:  Shortness of breath, dyspnea, weakness  EXAM: CHEST  2 VIEW  COMPARISON:  DG CHEST 1V PORT dated 04/27/2013; DG CHEST 2 VIEW dated 04/24/2013; DG CHEST 2 VIEW dated 04/22/2013  FINDINGS: Grossly unchanged enlarged cardiac silhouette and mediastinal contours. The pulmonary vasculature is less distinct on the present examination with cephalization of flow. Worsening perihilar and bilateral medial basilar heterogeneous/consolidative opacities, left greater than right. Interval development of small bilateral pleural effusions. No pneumothorax. Grossly unchanged bones. Multiple surgical clips overlie the midline of the lower neck. A vascular stent overlies the inner aspect of the left upper arm. Surgical clips overlie the bilateral axilla.  IMPRESSION: Findings most suggestive of worsening pulmonary edema, small bilateral effusions and perihilar/medial basilar opacities, left greater than right, atelectasis versus  infiltrate.   Electronically Signed   By: Sandi Mariscal M.D.   On: 04/27/2013 11:29   Dg Chest Port 1 View  04/27/2013   CLINICAL DATA:  Increasing shortness of breath question fluid overload  EXAM: PORTABLE CHEST - 1 VIEW  COMPARISON:  Portable exam 1245 hr compared to 04/24/2013  FINDINGS: Enlargement of cardiac silhouette with pulmonary vascular congestion.  Diffuse interstitial infiltrates consistent with pulmonary edema.  Additional atelectasis versus consolidation left lower lobe.  No pleural effusion or pneumothorax.  IMPRESSION: CHF with atelectasis versus consolidation in left lower lobe.   Electronically Signed   By: Lavonia Dana M.D.   On: 04/27/2013 09:14   Ir Kypho Vertebral Thoracic Augmentation  04/26/2013   CLINICAL DATA:  Prostate carcinoma. Multiple axial lesions on thoracic and lumbar MRI. Painful compression fracture deformity of T12.  EXAM: 1.  DEEP BONE BIOPSY THORACIC T12 UNDER FLUOROSCOPY  2.  KYPHOPLASTY AT THORACIC T12  TECHNIQUE: The procedure, risks (including but not limited to bleeding, infection, organ damage), benefits, and alternatives were explained to the patient. Questions regarding the procedure were encouraged and answered. The patient understands and consents to the procedure.  The patient was placed prone on the fluoroscopic table. The skin overlying the lower thoracic region was then prepped and draped in the usual sterile fashion. Maximal barrier sterile technique was utilized including caps, mask, sterile gowns, sterile gloves, sterile drape, hand hygiene and skin antiseptic.  Intravenous Fentanyl and Versed were administered as conscious sedation during continuous cardiorespiratory monitoring by the radiology RN, with a total moderate sedation time of40 minutes.  As antibiotic prophylaxis, !cefazolin 1 gram! was ordered pre-procedure and administered intravenously within !one hour! of incision.  The thoracic compression deformity of T12 was localized.The right pedicle  at T12 was then infiltrated with 1% lidocaine followed by the advancement of a Kyphon trocar needle through the right pedicle into the posterior one-third. The trocar was removed and a coaxial core bone biopsy was obtained. Subsequently, The osteo drill was advanced to the anterior third of the vertebral body. The osteo drill was retracted. Through the working cannula, a  Kyphon inflatable bone tamp 15 x 3 was advanced and positioned with the distal marker 5 mm from the anterior aspect of the cortex. Crossing of the midline was not seen on the AP projection. At this time, the balloon was expanded using contrast via a Kyphon inflation syringe device via micro tubing.  In similar fashion, the left pedicle was infiltrated with 1% lidocaine followed by the advancement of a second Kyphon trocar needle through the left pedicle into the posterior third of the vertebral body. Coaxial core deep bone biopsy sample obtained. The osteo drill was coaxially advanced to the anterior right third. The osteo drill was exchanged for a Kyphon inflatable bone tamp 15x 3, advanced to the 5 mm of the anterior aspect of the cortex. The balloon was then expanded using contrast as above.  Inflations were continued until there was near apposition across the midline and approaching the superior and the inferior end plates.  At this time, methylmethacrylate mixture was reconstituted in the Kyphon bone mixing device system. This was then loaded into the delivery mechanism, attached to Kyphon bone fillers.  The balloons were deflated and removed followed by the instillation of methylmethacrylate mixture with excellent filling in the AP and lateral projections. No extravasation was noted in the disk spaces or posteriorly into the spinal canal. No epidural venous contamination was seen.  The patient tolerated the procedure well. There were no acute complications. The working cannulae and the bone filler were then retrieved and removed.  IMPRESSION:  1. Deep bone core biopsy of thoracic T12 lesion. 2. Status post vertebral body augmentation using balloon kyphoplasty at thoracic T12 as described without event.   Electronically Signed   By: Arne Cleveland M.D.   On: 04/26/2013 18:14   . antiseptic oral rinse  15 mL Mouth Rinse BID  . atorvastatin  80 mg Oral q1800  . calcium acetate  1,334 mg Oral TID WC  . collagenase   Topical Daily  . enoxaparin (LOVENOX) injection  40 mg Subcutaneous Q24H  . feeding supplement (GLUCERNA SHAKE)  237 mL Oral BID BM  . furosemide  120 mg Intravenous Q12H  . hydrALAZINE  25 mg Oral 3 times per day  . insulin aspart  0-9 Units Subcutaneous TID WC  . insulin glargine  15 Units Subcutaneous QHS  . isosorbide dinitrate  40 mg Oral BID  . levothyroxine  150 mcg Oral QAC breakfast  . metoprolol  100 mg Oral BID  . multivitamin with minerals  1 tablet Oral Daily  . mycophenolate  720 mg Oral BID  . pantoprazole  40 mg Oral Daily  . piperacillin-tazobactam (ZOSYN)  IV  3.375 g Intravenous 3 times per day  . sirolimus  2 mg Oral q morning - 10a  . sodium chloride  3 mL Intravenous Q12H  . vancomycin  1,750 mg Intravenous Once  . [START ON 04/29/2013] vancomycin  1,000 mg Intravenous Q24H    BMET    Component Value Date/Time   NA 142 04/28/2013 0655   K 4.5 04/28/2013 0655   CL 92* 04/28/2013 0655   CO2 27 04/28/2013 0655   GLUCOSE 137* 04/28/2013 0655   BUN 56* 04/28/2013 0655   CREATININE 2.60* 04/28/2013 0655   CALCIUM 7.0* 04/28/2013 0655   GFRNONAA 25* 04/28/2013 0655   GFRAA 29* 04/28/2013 0655   CBC    Component Value Date/Time   WBC 13.7* 04/28/2013 0440   RBC 3.39* 04/28/2013 0440   RBC 3.35* 03/27/2013 0930  HGB 7.9* 04/28/2013 0440   HCT 26.1* 04/28/2013 0440   PLT 266 04/28/2013 0440   MCV 77.0* 04/28/2013 0440   MCH 23.3* 04/28/2013 0440   MCHC 30.3 04/28/2013 0440   RDW 22.1* 04/28/2013 0440   LYMPHSABS 1.7 04/23/2013 0400   MONOABS 0.4 04/23/2013 0400   EOSABS 0.1 04/23/2013 0400   BASOSABS  0.0 04/23/2013 0400     Assessment/Plan:  1. 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 Scr stable, cont with rapamune/myfortic (tremors may be from elevated rapamune level, will check as he had been on dilt for a few days).   1. Will follow levels and hold dilt. 2. pulm edema/volume excess- not diuresing well over the last 2 days. 1. Agree with Swann-Ganz cath to further eval CO/CI  2. Did not diurese over the last 24 hrs and increase in Scr.  3. Await Swan-Ganz eval and adjust therapy accordingly.  3. CHF systolic (EF [id number]) 4. Prostate Ca - ?spinal mets s/p CT guided biopsy and await results. 5. CAD s/p PTA with stent, + myoview but no cardiac cath due to CKD 6. Hypocalcemia- resolved. 7. SHP and hyperphosphatemia- will start Calcium acetate and follow 8. Severe protein malnutrition- will need protein supplementation 9. Anemia- no on ESA and has h/o prostate CA. Transfuse prn. 10. Atrial tachycardia- on po MTP and dilt 11. Deconditioning- may benefit from PT/OT eval and possible SNF vs. CIR stay 12.   Trent A

## 2013-04-28 NOTE — Consult Note (Signed)
Reason for Consult: CHF Referring Physician:   [name] is an 63 y.o. male.  HPI:   Joshua [name] is a 63 y.o. male with a history of CAD, HTN ,mild AS, dyslipidemia, PAD s/p CEA and fem-pop and  ESRD s/p Renal transplant with CKD now Cr 2.2-2.5. We are consulted for help with HF management.   ECHO EF 30-35%   IV lasix increased but diuresis is poor. Cr worsening. Remains SOB. Underwent T12 biopsy (? Prostate cancer) and kyphoplasty yesterday.   Physical exam Filed Vitals:   04/28/13 0530 04/28/13 0745 04/28/13 1100 04/28/13 1200  BP: 145/99 113/63 159/71 136/99  Pulse:  87 90 97  Temp:  98 F (36.7 C)    TempSrc:  Oral    Resp:  _0 Height:      Weight:      SpO2:  100% 100% 95%    General:  Chronically ill appearing appearing. No resp difficulty HEENT: normal x for poor dentition Neck: supple. no JVP 10cm Carotids 2+ bilat; no bruits. No lymphadenopathy or thryomegaly appreciated. Cor: PMI nonpalpable.  Regular rate & rhythm. Distant  No obvious rubs, gallops or murmurs. Lungs: +rhonchi dull at bases Abdomen: soft, nontender, + distended. No hepatosplenomegaly. No bruits or masses. Good bowel sounds. 2+ presacral edema Extremities: no cyanosis, clubbing, rash, tr edema + LUE AVF Neuro: alert & orientedx3, cranial nerves grossly intact. moves all 4 extremities w/o difficulty. Affect pleasant    Results for orders placed during the hospital encounter of 04/21/13 (from the past 48 hour(s))  GLUCOSE, CAPILLARY     Status: Abnormal   Collection Time    04/26/13  4:15 PM      Result Value Ref Range   Glucose-Capillary 125 (*) 70 - 99 mg/dL   Comment 1 Documented in Chart     Comment 2 Notify RN    CBC     Status: Abnormal   Collection Time    04/26/13  4:35 PM      Result Value Ref Range   WBC 12.3 (*) 4.0 - 10.5 K/uL   RBC 3.55 (*) 4.22 - 5.81 MIL/uL   Hemoglobin 8.1 (*) 13.0 - 17.0 g/dL   HCT 27.0 (*) 39.0 - 52.0 %   MCV 76.1 (*) 78.0 - 100.0 fL   MCH 22.8 (*) 26.0 - 34.0 pg   MCHC 30.0  30.0 - 36.0 g/dL   RDW 21.5 (*) 11.5 - 15.5 %   Platelets 286  150 - 400 K/uL  GLUCOSE, CAPILLARY     Status: Abnormal   Collection Time    04/26/13  9:41 PM      Result Value Ref Range   Glucose-Capillary 177 (*) 70 - 99 mg/dL  CALCIUM, IONIZED     Status: Abnormal   Collection Time    04/26/13 11:11 PM      Result Value Ref Range   Calcium, Ion 0.79 (*) 1.13 - 1.30 mmol/L   Comment: Performed at [facility]  CBC     Status: Abnormal   Collection Time    04/27/13  4:34 AM      Result Value Ref Range   WBC 13.7 (*) 4.0 - 10.5 K/uL   RBC 3.23 (*) 4.22 - 5.81 MIL/uL   Hemoglobin 7.3 (*) 13.0 - 17.0 g/dL   HCT 24.2 (*) 39.0 - 52.0 %   MCV 74.9 (*) 78.0 - 100.0 fL   MCH 22.6 (*) 26.0 - 34.0 pg  MCHC 30.2  30.0 - 36.0 g/dL   RDW 21.5 (*) 11.5 - 15.5 %   Platelets 265  150 - 400 K/uL  RENAL FUNCTION PANEL     Status: Abnormal   Collection Time    04/27/13  4:34 AM      Result Value Ref Range   Sodium 139  137 - 147 mEq/L   Potassium 4.0  3.7 - 5.3 mEq/L   Chloride 90 (*) 96 - 112 mEq/L   CO2 25  19 - 32 mEq/L   Glucose, Bld 269 (*) 70 - 99 mg/dL   BUN 52 (*) 6 - 23 mg/dL   Creatinine, Ser 2.44 (*) 0.50 - 1.35 mg/dL   Calcium 6.7 (*) 8.4 - 10.5 mg/dL   Phosphorus 6.3 (*) 2.3 - 4.6 mg/dL   Albumin 1.7 (*) 3.5 - 5.2 g/dL   GFR calc non Af Amer 27 (*) >90 mL/min   GFR calc Af Amer 31 (*) >90 mL/min   Comment: (NOTE)     The eGFR has been calculated using the CKD EPI equation.     This calculation has not been validated in all clinical situations.     eGFR's persistently <90 mL/min signify possible Chronic Kidney     Disease.  HIV ANTIBODY (ROUTINE TESTING)     Status: None   Collection Time    04/27/13  4:34 AM      Result Value Ref Range   HIV NON REACTIVE  NON REACTIVE   Comment: Performed at [place], CAPILLARY     Status: Abnormal   Collection Time    04/27/13  7:04 AM      Result Value Ref  Range   Glucose-Capillary 213 (*) 70 - 99 mg/dL  CBC     Status: Abnormal   Collection Time    04/27/13  9:15 AM      Result Value Ref Range   WBC 13.7 (*) 4.0 - 10.5 K/uL   RBC 3.46 (*) 4.22 - 5.81 MIL/uL   Hemoglobin 8.1 (*) 13.0 - 17.0 g/dL   HCT 26.6 (*) 39.0 - 52.0 %   MCV 76.9 (*) 78.0 - 100.0 fL   MCH 23.4 (*) 26.0 - 34.0 pg   MCHC 30.5  30.0 - 36.0 g/dL   RDW 22.0 (*) 11.5 - 15.5 %   Platelets 302  150 - 400 K/uL  MAGNESIUM     Status: None   Collection Time    04/27/13  9:15 AM      Result Value Ref Range   Magnesium 2.0  1.5 - 2.5 mg/dL  GLUCOSE, CAPILLARY     Status: Abnormal   Collection Time    04/27/13 11:01 AM      Result Value Ref Range   Glucose-Capillary 180 (*) 70 - 99 mg/dL  CBC     Status: Abnormal   Collection Time    04/27/13  4:55 PM      Result Value Ref Range   WBC 14.7 (*) 4.0 - 10.5 K/uL   Comment: WHITE COUNT CONFIRMED ON SMEAR   RBC 3.23 (*) 4.22 - 5.81 MIL/uL   Hemoglobin 7.4 (*) 13.0 - 17.0 g/dL   HCT 24.4 (*) 39.0 - 52.0 %   MCV 75.5 (*) 78.0 - 100.0 fL   MCH 22.9 (*) 26.0 - 34.0 pg   MCHC 30.3  30.0 - 36.0 g/dL   RDW 21.7 (*) 11.5 - 15.5 %   Platelets 247  150 -  400 K/uL  TROPONIN I     Status: None   Collection Time    04/27/13  4:55 PM      Result Value Ref Range   Troponin I <0.30  <0.30 ng/mL   Comment:            Due to the release kinetics of cTnI,     a negative result within the first hours     of the onset of symptoms does not rule out     myocardial infarction with certainty.     If myocardial infarction is still suspected,     repeat the test at appropriate intervals.  GLUCOSE, CAPILLARY     Status: Abnormal   Collection Time    04/27/13  5:00 PM      Result Value Ref Range   Glucose-Capillary 235 (*) 70 - 99 mg/dL  GLUCOSE, CAPILLARY     Status: Abnormal   Collection Time    04/27/13  9:18 PM      Result Value Ref Range   Glucose-Capillary 168 (*) 70 - 99 mg/dL  CBC     Status: Abnormal   Collection Time     04/28/13  4:40 AM      Result Value Ref Range   WBC 13.7 (*) 4.0 - 10.5 K/uL   RBC 3.39 (*) 4.22 - 5.81 MIL/uL   Hemoglobin 7.9 (*) 13.0 - 17.0 g/dL   HCT 26.1 (*) 39.0 - 52.0 %   MCV 77.0 (*) 78.0 - 100.0 fL   MCH 23.3 (*) 26.0 - 34.0 pg   MCHC 30.3  30.0 - 36.0 g/dL   RDW 22.1 (*) 11.5 - 15.5 %   Platelets 266  150 - 400 K/uL   Comment: REPEATED TO VERIFY     PLATELET COUNT CONFIRMED BY SMEAR  RENAL FUNCTION PANEL     Status: Abnormal   Collection Time    04/28/13  6:55 AM      Result Value Ref Range   Sodium 142  137 - 147 mEq/L   Potassium 4.5  3.7 - 5.3 mEq/L   Comment: HEMOLYSIS AT THIS LEVEL MAY AFFECT RESULT   Chloride 92 (*) 96 - 112 mEq/L   CO2 27  19 - 32 mEq/L   Glucose, Bld 137 (*) 70 - 99 mg/dL   BUN 56 (*) 6 - 23 mg/dL   Creatinine, Ser 2.60 (*) 0.50 - 1.35 mg/dL   Calcium 7.0 (*) 8.4 - 10.5 mg/dL   Phosphorus 6.4 (*) 2.3 - 4.6 mg/dL   Albumin 1.7 (*) 3.5 - 5.2 g/dL   GFR calc non Af Amer 25 (*) >90 mL/min   GFR calc Af Amer 29 (*) >90 mL/min   Comment: (NOTE)     The eGFR has been calculated using the CKD EPI equation.     This calculation has not been validated in all clinical situations.     eGFR's persistently <90 mL/min signify possible Chronic Kidney     Disease.  GLUCOSE, CAPILLARY     Status: Abnormal   Collection Time    04/28/13  7:48 AM      Result Value Ref Range   Glucose-Capillary 138 (*) 70 - 99 mg/dL    Dg Chest 2 View  04/27/2013   CLINICAL DATA:  Shortness of breath, dyspnea, weakness  EXAM: CHEST  2 VIEW  COMPARISON:  DG CHEST 1V PORT dated 04/27/2013; DG CHEST 2 VIEW dated 04/24/2013; DG CHEST 2 VIEW dated 04/22/2013  FINDINGS: Grossly unchanged enlarged cardiac silhouette and mediastinal contours. The pulmonary vasculature is less distinct on the present examination with cephalization of flow. Worsening perihilar and bilateral medial basilar heterogeneous/consolidative opacities, left greater than right. Interval development of small  bilateral pleural effusions. No pneumothorax. Grossly unchanged bones. Multiple surgical clips overlie the midline of the lower neck. A vascular stent overlies the inner aspect of the left upper arm. Surgical clips overlie the bilateral axilla.  IMPRESSION: Findings most suggestive of worsening pulmonary edema, small bilateral effusions and perihilar/medial basilar opacities, left greater than right, atelectasis versus infiltrate.   Electronically Signed   By: Sandi Mariscal M.D.   On: 04/27/2013 11:29   Dg Chest Port 1 View  04/27/2013   CLINICAL DATA:  Increasing shortness of breath question fluid overload  EXAM: PORTABLE CHEST - 1 VIEW  COMPARISON:  Portable exam 6834 hr compared to 04/24/2013  FINDINGS: Enlargement of cardiac silhouette with pulmonary vascular congestion.  Diffuse interstitial infiltrates consistent with pulmonary edema.  Additional atelectasis versus consolidation left lower lobe.  No pleural effusion or pneumothorax.  IMPRESSION: CHF with atelectasis versus consolidation in left lower lobe.   Electronically Signed   By: Lavonia Dana M.D.   On: 04/27/2013 09:14   Ir Kypho Vertebral Thoracic Augmentation  04/26/2013   CLINICAL DATA:  Prostate carcinoma. Multiple axial lesions on thoracic and lumbar MRI. Painful compression fracture deformity of T12.  EXAM: 1.  DEEP BONE BIOPSY THORACIC T12 UNDER FLUOROSCOPY  2.  KYPHOPLASTY AT THORACIC T12  TECHNIQUE: The procedure, risks (including but not limited to bleeding, infection, organ damage), benefits, and alternatives were explained to the patient. Questions regarding the procedure were encouraged and answered. The patient understands and consents to the procedure.  The patient was placed prone on the fluoroscopic table. The skin overlying the lower thoracic region was then prepped and draped in the usual sterile fashion. Maximal barrier sterile technique was utilized including caps, mask, sterile gowns, sterile gloves, sterile drape, hand hygiene  and skin antiseptic.  Intravenous Fentanyl and Versed were administered as conscious sedation during continuous cardiorespiratory monitoring by the radiology RN, with a total moderate sedation time of40 minutes.  As antibiotic prophylaxis, !cefazolin 1 gram! was ordered pre-procedure and administered intravenously within !one hour! of incision.  The thoracic compression deformity of T12 was localized.The right pedicle at T12 was then infiltrated with 1% lidocaine followed by the advancement of a Kyphon trocar needle through the right pedicle into the posterior one-third. The trocar was removed and a coaxial core bone biopsy was obtained. Subsequently, The osteo drill was advanced to the anterior third of the vertebral body. The osteo drill was retracted. Through the working cannula, a Kyphon inflatable bone tamp 15 x 3 was advanced and positioned with the distal marker 5 mm from the anterior aspect of the cortex. Crossing of the midline was not seen on the AP projection. At this time, the balloon was expanded using contrast via a Kyphon inflation syringe device via micro tubing.  In similar fashion, the left pedicle was infiltrated with 1% lidocaine followed by the advancement of a second Kyphon trocar needle through the left pedicle into the posterior third of the vertebral body. Coaxial core deep bone biopsy sample obtained. The osteo drill was coaxially advanced to the anterior right third. The osteo drill was exchanged for a Kyphon inflatable bone tamp 15x 3, advanced to the 5 mm of the anterior aspect of the cortex. The balloon was then expanded using contrast as  above.  Inflations were continued until there was near apposition across the midline and approaching the superior and the inferior end plates.  At this time, methylmethacrylate mixture was reconstituted in the Kyphon bone mixing device system. This was then loaded into the delivery mechanism, attached to Kyphon bone fillers.  The balloons were deflated  and removed followed by the instillation of methylmethacrylate mixture with excellent filling in the AP and lateral projections. No extravasation was noted in the disk spaces or posteriorly into the spinal canal. No epidural venous contamination was seen.  The patient tolerated the procedure well. There were no acute complications. The working cannulae and the bone filler were then retrieved and removed.  IMPRESSION: 1. Deep bone core biopsy of thoracic T12 lesion. 2. Status post vertebral body augmentation using balloon kyphoplasty at thoracic T12 as described without event.   Electronically Signed   By: Arne Cleveland M.D.   On: 04/26/2013 18:14    ROS Physical Exam    Assessment/Plan: 1) A/c renal failure s/p renal transplant 2) A/c systolic HF     --EF 02-63% 3) CAD 4) Mild AS 5) PAD  Plan:  He is diuresing poorly. Renal function getting worse. Appears to be volume overloaded on exam. Case d/w Primary team and Renal. Will proceed with swan placement to assess volume status and cardiac output  Deundra Bard,MD 12:26 PM

## 2013-04-28 NOTE — Progress Notes (Signed)
CRITICAL VALUE ALERT  Critical value received:  hgb 7.2  Date of notification:  04/28/2013  Time of notification:  2200  Critical value read back:yes  Nurse who received alert:  Purcell Nails  MD notified (1st page):  rabbani Time of first page:  2242  MD notified (2nd page):  Time of second page:  Responding MD:  Naaman Plummer  Time MD responded:  2242

## 2013-04-28 NOTE — Consult Note (Signed)
ANTIBIOTIC CONSULT NOTE - INITIAL  Pharmacy Consult for vancomycin/zosyn Indication: pneumonia (HCAP)  Allergies  Allergen Reactions  . Fentanyl     "Almost died"   . Propofol     "Almost died"     Patient Measurements: Height: 5\' 11"  (180.3 cm) Weight: 181 lb 3.5 oz (82.2 kg) IBW/kg (Calculated) : 75.3  Vital Signs: Temp: 98 F (36.7 C) (02/20 0745) Temp src: Oral (02/20 0745) BP: 113/63 mmHg (02/20 0745) Pulse Rate: 87 (02/20 0745) Intake/Output from previous day: 02/19 0701 - 02/20 0700 In: 1043 [P.O.:860; I.V.:73; IV Piggyback:110] Out: 850 [Urine:850]  Labs:  Recent Labs  04/26/13 0900  04/27/13 0434 04/27/13 0915 04/27/13 1655 04/28/13 0440 04/28/13 0655  WBC  --   < > 13.7* 13.7* 14.7* 13.7*  --   HGB  --   < > 7.3* 8.1* 7.4* 7.9*  --   PLT  --   < > 265 302 247 266  --   CREATININE 2.25*  --  2.44*  --   --   --  2.60*  < > = values in this interval not displayed. Estimated Creatinine Clearance: 31.8 ml/min (by C-G formula based on Cr of 2.6). No results found for this basename: VANCOTROUGH, Corlis Leak, VANCORANDOM, [place], [place], [place], [place], TOBRAPEAK, TOBRARND, AMIKACINPEAK, AMIKACINTROU, AMIKACIN,  in the last 72 hours   Microbiology: Recent Results (from the past 720 hour(s))  PNEUMOCYSTIS JIROVECI SMEAR BY DFA     Status: None   Collection Time    04/04/13 12:04 AM      Result Value Ref Range Status   Specimen Source-PJSRC SPUTUM   Final   Pneumocystis jiroveci Ag NEGATIVE   Final   Comment: Performed at [facility] History: Past Medical History  Diagnosis Date  . Carotid artery occlusion   . ASCVD (arteriosclerotic cardiovascular disease)   . CHF (congestive heart failure)   . Anginal pain     last chest pain was a couple of months ago-just lasted less than a minute  . Stroke     "light stroke" about time of MI  . Renal failure     s/p renal transplant  . Aortic stenosis     moderate  AS by 06/2011 echo (Dr. Fransico Him)  . Coronary artery disease 03/2009    2 vessel s/p BMS to LAD in setting of NSTEMI, RCA chronically occluded  . PVD (peripheral vascular disease)     s/p fempop 1980's  . Hypothyroidism     s/p papillary thyroid CA excision  . Hypertension   . Hyperlipidemia   . Diastolic dysfunction   . Type II diabetes mellitus   . Prostate cancer     "radiation tx ~ 4-5 yr ago; cleared then came back a couple months ago" (03/20/2013)  . Plavix resistance     a. P2y12 365 in 03/2013.  . End stage renal disease- s/p transplant 04/03/2013    Patient states he started dialysis sometime around 1981-1982.  He then received a kidney transplant in 2002 at [facility].  The transplant kidney is still functioning, he sees Dr Florene Glen at [facility] about every 6 months as of 2015.      Medications:  Scheduled:  . antiseptic oral rinse  15 mL Mouth Rinse BID  . atorvastatin  80 mg Oral q1800  . calcium acetate  1,334 mg Oral TID WC  . collagenase   Topical Daily  . enoxaparin (LOVENOX) injection  40 mg  Subcutaneous Q24H  . feeding supplement (GLUCERNA SHAKE)  237 mL Oral BID BM  . furosemide  120 mg Intravenous Q12H  . hydrALAZINE  25 mg Oral 3 times per day  . insulin aspart  0-9 Units Subcutaneous TID WC  . insulin glargine  15 Units Subcutaneous QHS  . isosorbide dinitrate  40 mg Oral BID  . levothyroxine  150 mcg Oral QAC breakfast  . metoprolol  100 mg Oral BID  . multivitamin with minerals  1 tablet Oral Daily  . mycophenolate  720 mg Oral BID  . pantoprazole  40 mg Oral Daily  . piperacillin-tazobactam (ZOSYN)  IV  3.375 g Intravenous 3 times per day  . sirolimus  2 mg Oral q morning - 10a  . sodium chloride  3 mL Intravenous Q12H  . vancomycin  1,750 mg Intravenous Once  . [START ON 04/29/2013] vancomycin  1,000 mg Intravenous Q24H   Assessment: 63 yo M admitted with SOB and volume overload. PMH is significant for CKD (13 years s/p renal transplant, on immunosuppressive  therapy with Rapamune and Myfortic), CAD, PVD, HTN, and prostate cancer. CXR shows possible LLL infiltrate. Due to this, as well as in combination with fever and leukocytosis in an immunosuppressed patient, pharmacy has been consulted to begin vancomycin and zosyn for HCAP treatment.  SCr has been trending up (2.6 today). CrCl ~32 mL/min. WBC elevated at 13.7. Tmax 100.5  Vancomycin 2/20 >> Zosyn 2/20 >>  2/20 blood x2 >> sent  Goal of Therapy:  Vancomycin trough level 15-20 mcg/ml  Plan:  -Vancomycin 1750 mg IV x 1 dose today -Vancomycin 1000 mg IV q 24 h starting tomorrow -Zosyn 3.375g IV q 8 h -monitor renal function, clinical progression, fever trend, cultures, trough at steady state when necessary  Drena Ham C. Maston Wight, PharmD Clinical Pharmacist-Resident Pager: [phone] Pharmacy: [phone] 04/28/2013 11:21 AM

## 2013-04-28 NOTE — CV Procedure (Addendum)
Cardiac Cath Procedure Note:  Indication:  Heart failure. Renal failure.   Procedures performed:  1) Right heart catheterization 2) Minx R femoral vein closure  Description of procedure:   The risks and indication of the procedure were explained. Consent was signed and placed on the chart. An appropriate timeout was taken prior to the procedure. The right groin was prepped and draped in the routine sterile fashion and anesthetized with 1% local lidocaine.   A 7 FR venous sheath was placed in the right femoral vein using a modified Seldinger technique. A standard Swan-Ganz catheter was used for the procedure.   Given the patient's inability to stay still or lie flat, at the end of the procedure we closed his right femoral vein using a Minx closure device under the supervision of Dr. Glenetta Hew.   Complications: None apparent.  Findings:  RA = 9 RV = 45/14/14 PA = 43/19 (27) PCW = 12 Fick cardiac output/index = 5.4/2.7 Thermo CO/CI = 8.25/4.1 PVR = 2.8 WU FA sat = 98% PA sat = 50%, 53%  Assessment:  1. Filling pressures relatively normal 2. Mixed venous sat is on low end but cardiac output calculates as normal. (Cardiac output may be artificially increased due to presence of AVF)  Plan/Discussion:  Suspect main issue is renal and he may also be developing uremic symptoms. No role for IV inotropes currently.   I will check in on him again on Monday. Please don't hesitate to call me over the weekend.   Quillian Quince BensimhonMD 3:42 PM

## 2013-04-29 ENCOUNTER — Inpatient Hospital Stay (HOSPITAL_COMMUNITY): Payer: Medicare Other

## 2013-04-29 LAB — GLUCOSE, CAPILLARY
GLUCOSE-CAPILLARY: 156 mg/dL — AB (ref 70–99)
GLUCOSE-CAPILLARY: 173 mg/dL — AB (ref 70–99)
Glucose-Capillary: 190 mg/dL — ABNORMAL HIGH (ref 70–99)
Glucose-Capillary: 199 mg/dL — ABNORMAL HIGH (ref 70–99)

## 2013-04-29 LAB — BODY FLUID CELL COUNT WITH DIFFERENTIAL
LYMPHS FL: 7 %
Monocyte-Macrophage-Serous Fluid: 60 % (ref 50–90)
NEUTROPHIL FLUID: 33 % — AB (ref 0–25)
Total Nucleated Cell Count, Fluid: 537 cu mm (ref 0–1000)

## 2013-04-29 LAB — CBC
HCT: 21.9 % — ABNORMAL LOW (ref 39.0–52.0)
HEMATOCRIT: 25.7 % — AB (ref 39.0–52.0)
Hemoglobin: 7 g/dL — ABNORMAL LOW (ref 13.0–17.0)
Hemoglobin: 6.8 g/dL — CL (ref 13.0–17.0)
MCH: 20.6 pg — ABNORMAL LOW (ref 26.0–34.0)
MCH: 23.2 pg — ABNORMAL LOW (ref 26.0–34.0)
MCHC: 27.2 g/dL — AB (ref 30.0–36.0)
MCHC: 31.1 g/dL (ref 30.0–36.0)
MCV: 75.8 fL — AB (ref 78.0–100.0)
MCV: 74.7 fL — AB (ref 78.0–100.0)
Platelets: 170 10*3/uL (ref 150–400)
Platelets: 209 10*3/uL (ref 150–400)
RBC: 3.39 MIL/uL — ABNORMAL LOW (ref 4.22–5.81)
RBC: 2.93 MIL/uL — AB (ref 4.22–5.81)
RDW: 21.8 % — AB (ref 11.5–15.5)
RDW: 21.5 % — ABNORMAL HIGH (ref 11.5–15.5)
WBC: 8.6 10*3/uL (ref 4.0–10.5)
WBC: 9.6 10*3/uL (ref 4.0–10.5)

## 2013-04-29 LAB — GLUCOSE, SEROUS FLUID: GLUCOSE FL: 242 mg/dL

## 2013-04-29 LAB — RENAL FUNCTION PANEL
ALBUMIN: 1.5 g/dL — AB (ref 3.5–5.2)
BUN: 63 mg/dL — AB (ref 6–23)
CO2: 23 mEq/L (ref 19–32)
CREATININE: 2.82 mg/dL — AB (ref 0.50–1.35)
Calcium: 6.7 mg/dL — ABNORMAL LOW (ref 8.4–10.5)
Chloride: 92 mEq/L — ABNORMAL LOW (ref 96–112)
GFR calc Af Amer: 26 mL/min — ABNORMAL LOW (ref >90)
GFR calc non Af Amer: 23 mL/min — ABNORMAL LOW (ref >90)
Glucose, Bld: 256 mg/dL — ABNORMAL HIGH (ref 70–99)
PHOSPHORUS: 6.5 mg/dL — AB (ref 2.3–4.6)
POTASSIUM: 4.7 meq/L (ref 3.7–5.3)
Sodium: 138 mEq/L (ref 137–147)

## 2013-04-29 LAB — PREPARE RBC (CROSSMATCH)

## 2013-04-29 LAB — ALBUMIN, FLUID (OTHER): Albumin, Fluid: 1.1 g/dL

## 2013-04-29 LAB — LACTATE DEHYDROGENASE, PLEURAL OR PERITONEAL FLUID: LD, Fluid: 734 U/L — ABNORMAL HIGH (ref 3–23)

## 2013-04-29 LAB — PROTEIN, BODY FLUID: Total protein, fluid: 2.9 g/dL

## 2013-04-29 LAB — TSH: TSH: 37.669 u[IU]/mL — AB (ref 0.350–4.500)

## 2013-04-29 MED ORDER — ASPIRIN EC 81 MG PO TBEC
81.0000 mg | DELAYED_RELEASE_TABLET | Freq: Every day | ORAL | Status: DC
  Administered 2013-04-29 – 2013-05-14 (×14): 81 mg via ORAL
  Filled 2013-04-29 (×18): qty 1

## 2013-04-29 MED ORDER — PIPERACILLIN-TAZOBACTAM 3.375 G IVPB
3.3750 g | Freq: Three times a day (TID) | INTRAVENOUS | Status: DC
  Administered 2013-04-29 – 2013-04-30 (×3): 3.375 g via INTRAVENOUS
  Filled 2013-04-29 (×6): qty 50

## 2013-04-29 MED ORDER — VANCOMYCIN HCL IN DEXTROSE 1-5 GM/200ML-% IV SOLN: 1000.0000 mg | INTRAVENOUS | Status: DC

## 2013-04-29 MED ORDER — VANCOMYCIN HCL IN DEXTROSE 1-5 GM/200ML-% IV SOLN
1000.0000 mg | INTRAVENOUS | Status: AC
  Administered 2013-04-29 – 2013-05-04 (×5): 1000 mg via INTRAVENOUS
  Filled 2013-04-29 (×8): qty 200

## 2013-04-29 MED ORDER — CALCIUM ACETATE 667 MG PO CAPS
1334.0000 mg | ORAL_CAPSULE | Freq: Three times a day (TID) | ORAL | Status: DC
  Administered 2013-04-29 – 2013-04-30 (×2): 1334 mg via ORAL
  Filled 2013-04-29 (×6): qty 2

## 2013-04-29 MED ORDER — SODIUM CHLORIDE 0.9 % IV SOLN
1.0000 g | Freq: Once | INTRAVENOUS | Status: AC
  Administered 2013-04-29: 1 g via INTRAVENOUS
  Filled 2013-04-29: qty 10

## 2013-04-29 NOTE — Progress Notes (Signed)
Subjective: Joshua [name] had runs of SVT on telemetry overnight/early morning.  He was asymptomatic.    He was seen and examined this morning.  He is drowsy but arousable likely due to sedative medications he received for yesterday's procedure.  The nurse reports he had visitors early AM (around 2AM) who woke him up so lack of sleep may be contributing.  He denies CP, SOB or back pain.  Objective: Vital signs in last 24 hours: Filed Vitals:   04/29/13 0400 04/29/13 0500 04/29/13 0600 04/29/13 0635  BP: 100/59 119/77 104/54   Pulse: 108   88  Temp: 98.5 F (36.9 C)     TempSrc: Oral     Resp: 22 19 20 17   Height:      Weight:    83.5 kg (184 lb 1.4 oz)  SpO2: 97%   93%   Weight change: 1.3 kg (2 lb 13.9 oz)  Intake/Output Summary (Last 24 hours) at 04/29/13 0758 Last data filed at 04/28/13 1900  Gross per 24 hour  Intake    490 ml  Output    400 ml  Net     90 ml   General: Laying flat in bed in NAD, tremulous Cardiac: RRR, no rubs, murmurs or gallops Pulm: decreased BS, no wheezing or crackles appreciated Abd: less distended, soft, BS present, non-tender GU:  ~ 50cc clear urine output in catheter bag Ext: warm and well perfused, no lower extremity edema Neuro: alert and oriented X3, drowsy but arousable  Lab Results: Basic Metabolic Panel:  Recent Labs Lab 04/27/13 0434 04/27/13 0915 04/28/13 0655 04/28/13 2055 04/29/13 0525  NA 139  --  142  --  138  K 4.0  --  4.5  --  4.7  CL 90*  --  92*  --  92*  CO2 25  --  27  --  23  GLUCOSE 269*  --  137*  --  256*  BUN 52*  --  56*  --  63*  CREATININE 2.44*  --  2.60* 2.80* 2.82*  CALCIUM 6.7*  --  7.0*  --  6.7*  MG  --  2.0  --   --   --   PHOS 6.3*  --  6.4*  --  6.5*   Liver Function Tests:  Recent Labs Lab 04/28/13 0655 04/28/13 2055 04/29/13 0525  PROT  --  6.7  --   ALBUMIN 1.7*  --  1.5*   CBC:  Recent Labs Lab 04/23/13 0400  04/28/13 2055 04/29/13 0525  WBC 10.2  < > 13.1* 8.6    NEUTROABS 8.0*  --   --   --   HGB 7.6*  < > 7.2* 7.0*  HCT 24.5*  < > 23.5* 25.7*  MCV 74.2*  < > 75.3* 75.8*  PLT 302  < > 239 170  < > = values in this interval not displayed.  CBG:  Recent Labs Lab 04/27/13 1101 04/27/13 1700 04/27/13 2118 04/28/13 0748 04/28/13 1719 04/28/13 2131  GLUCAP 180* 235* 168* 138* 200* 228*   Medications: I have reviewed the patient's current medications. Scheduled Meds: . antiseptic oral rinse  15 mL Mouth Rinse BID  . aspirin EC  81 mg Oral Daily  . atorvastatin  80 mg Oral q1800  . calcium acetate  1,334 mg Oral TID WC  . calcium gluconate  1 g Intravenous Once  . collagenase   Topical Daily  . enoxaparin (LOVENOX) injection  30 mg Subcutaneous  Q24H  . feeding supplement (GLUCERNA SHAKE)  237 mL Oral BID BM  . furosemide  40 mg Oral Daily  . hydrALAZINE  25 mg Oral 3 times per day  . insulin aspart  0-9 Units Subcutaneous TID WC  . insulin glargine  15 Units Subcutaneous QHS  . isosorbide dinitrate  40 mg Oral BID  . levothyroxine  150 mcg Oral QAC breakfast  . metoprolol  100 mg Oral BID  . multivitamin with minerals  1 tablet Oral Daily  . mycophenolate  720 mg Oral BID  . pantoprazole  40 mg Oral Daily  . piperacillin-tazobactam (ZOSYN)  IV  3.375 g Intravenous 3 times per day  . sirolimus  2 mg Oral q morning - 10a  . sodium chloride  3 mL Intravenous Q12H  . sodium chloride  3 mL Intravenous Q12H  . sodium chloride  3 mL Intravenous Q12H  . vancomycin  1,000 mg Intravenous Q24H   Continuous Infusions: . sodium chloride 10 mL/hr at 04/28/13 0800   PRN Meds:.sodium chloride, sodium chloride, acetaminophen, albuterol, diphenhydrAMINE, oxyCODONE, sodium chloride, sodium chloride  Assessment/Plan: 63 year old male with a PMH of HTN, DM type 2, CAD (s/p MI and LAD stent), kidney transplantation, carotid artery stenosis (s/p CEA 08/2011), PVD (s/p fem-pop in 1980), dHF (EF 45-50% Jan 2015), hx of papillary thyroid cancer and  prostate cancer who presents with complaint of edema x 3 days and dyspnea for several weeks.   Volume overload:  Likely 2/2 worsening CHF in the setting of CKD about 13ys s/p renal transplant.  CXR with small bilateral pleural effusions.  Worsening BUN and Cr and decreased urine output despite increased IV Lasix.  Right heart cath on 02/20 revealed normal PCWP and normal CO.   In light of cath findings, the effusions seen on CXR may be due to pneumonia.  Antibiotics were started on 02/20 and his dyspnea has improved today.  He is afebrile and no longer requires supplemental oxygen.   - oxygen supplementation  - incentive spirometer  - albuterol nebs q4h - Lasix 120mg  BID stopped yesterday --> Lasix 40mg  po starting today - Coreg 100mg  BID, hydralazine 25mg  q8h; isordil 40mg  BID - Daily weights and I&Os  - plan for diagnostic paracentesis for pleural fluid analysis - possibly transfer to SDU today  HCAP:  Fevers, leukocytosis and possible LLL infiltrate on CXR in an immunosuppressed patient.  He has been afebrile for the past 24 hours.  Leukocytosis improving. - oxygen supplementation prn - blood cultures pending - Vancomycin and Zosyn per pharmacy - diagnostic paracentesis as above  Chest pain:  Resolved.  Acute onset of 10/10 chest pain during nebulizer treatment yesterday.  No significant EKG findings. Troponin negative.  Possibly 2/2 to pneumonia.   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 Baseline Cr 1.9-2.5. Creatine 2.24 --> 2.6.  Concern for cardiorenal syndrome given worsening creatinine.   - Continue anti-rejection meds  T10 lesion noted on prior MRI:  concerning for mets from Prostate CA.  Followed by Dr. Diona Fanti (Urology).  - T10 bone biopsy and kyphoplasty done on 04/26/13.  Acute on chronic anemia: Stable.  Hgb 7.1 on admission and tx 1u pRBCs, responded to 8.1. Hgb 7.0 this morning. No active bleeding.  - stat CBC; may need PRBCs depending  on result - q12 CBC  Hypocalcemia: 6.3 on admission and he received calcium gluconate.  Repeat on 12/15 was 6.1 w/ albumin 1.6. Corrected Ca  was 8.0. However ionized Calcium low at 0.71-->0.79. Replacing Calcium.  DM type 2:  Previously on 24 units Lantus at home, but wife reported hypoglycemic episodes, so she was administering 12 units over the past few days.  Pt started on Lantus 15u on admission w/ SSI.  BG 138-228 in past 24 hours.  - continue Lantus 15 units --> 17 units qHS  HTN:  Stable.  Continued lopressor, hydralazine, lasix, isordil.  H/o prostate cancer: Question of spinal mets at last hospitalization, reportedly seen on MRI, but negative bone scan 02/2013. Patient seen by Dr. Diona Fanti with Urology during prior hospitalization.  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  His back pain is being controlled with oxycodone and acetaminophen. - Continue Oxycodone IR 10mg  q 4h PRN and Tylenol  Unstageable pressure ulcer, sacral:  Patient aware of ulcer prior to admission.  Wound care consulted and recommendations appreciated. - continue debridement ointment and moist 2x2 - pressure redistribution pad   DVT ppx: SCDs and lovenox  Dispo: Disposition is deferred at this time, awaiting improvement of current medical problems.  Anticipated discharge in approximately 1-2 day(s).   The patient does have a current PCP (Sueanne Margarita, MD) and may need an [facility] hospital follow-up appointment after discharge.  The patient does not know have transportation limitations that hinder transportation to clinic appointments.  .Services Needed at time of discharge: Y = Yes, Blank = No PT:   OT:   RN:   Equipment:   Other:     LOS: 8 days   Joshua Maxin, DO 04/29/2013, 7:58 AM

## 2013-04-29 NOTE — Procedures (Signed)
US guided diagnostic/therapeutic left thoracentesis performed yielding 780 cc's yellow fluid. The fluid was sent to the lab for preordered studies. F/u CXR pending. No immediate complications.

## 2013-04-29 NOTE — Progress Notes (Signed)
    Subjective:  Denies CP or dyspnea   Objective:  Filed Vitals:   04/29/13 0400 04/29/13 0500 04/29/13 0600 04/29/13 0635  BP: 100/59 119/77 104/54   Pulse: 108   88  Temp: 98.5 F (36.9 C)     TempSrc: Oral     Resp: 22 19 20 17   Height:      Weight:    184 lb 1.4 oz (83.5 kg)  SpO2: 97%   93%    Intake/Output from previous day:  Intake/Output Summary (Last 24 hours) at 04/29/13 0654 Last data filed at 04/28/13 1900  Gross per 24 hour  Intake    490 ml  Output    400 ml  Net     90 ml    Physical Exam: Physical exam: Well-developed chronically ill appearing in no acute distress.  Skin is warm and dry.  HEENT is normal.  Neck is supple.  Chest with diminished BS bases (R>L). Cardiovascular exam is regular rate and rhythm. 2/6 systolic murmur Abdominal exam nontender; mildly distended Extremities show no edema. neuro grossly intact; some twitching during interview    Lab Results: Basic Metabolic Panel:  Recent Labs  04/27/13 0434 04/27/13 0915 04/28/13 0655 04/28/13 2055 04/29/13 0525  NA 139  --  142  --  138  K 4.0  --  4.5  --  4.7  CL 90*  --  92*  --  92*  CO2 25  --  27  --  23  GLUCOSE 269*  --  137*  --  256*  BUN 52*  --  56*  --  63*  CREATININE 2.44*  --  2.60* 2.80* 2.82*  CALCIUM 6.7*  --  7.0*  --  6.7*  MG  --  2.0  --   --   --   PHOS 6.3*  --  6.4*  --  6.5*   CBC:  Recent Labs  04/28/13 2055 04/29/13 0525  WBC 13.1* 8.6  HGB 7.2* 7.0*  HCT 23.5* 25.7*  MCV 75.3* 75.8*  PLT 239 170   Cardiac Enzymes:  Recent Labs  04/27/13 1655  TROPONINI <0.30     Assessment/Plan:  1 SVT-patient is having runs of SVT on telemetry. He is not symptomatic. Continue metoprolol. He may need amiodarone if no contraindication from a renal standpoint. I will review with nephrology. 2 coronary artery disease-continue aspirin and statin. 3 acute systolic congestive heart failure-continue hydralazine/nitrate. No ACE inhibitor given renal  insufficiency. Continue present dose of beta blocker. Patient underwent right heart catheterization yesterday and pulmonary capillary wedge pressure was 12. Diuretics per nephrology. Some volume excess was likely related to renal insufficiency and low albumin. 4 chronic kidney disease stage 4/status post renal transplant-management per nephrology. 5 prostate cancer-awaiting biopsy results. 6 anemia-management per primary care.  Joshua [name] 04/29/2013, 6:54 AM

## 2013-04-29 NOTE — Progress Notes (Signed)
Patient ID: Joshua [name], male   DOB: 02/11/1951, 63 y.o.   MRN: [id number] S:somnolent after sedation for procedure O:BP 126/52  Pulse 88  Temp(Src) 98.5 F (36.9 C) (Oral)  Resp 17  Ht 5\' 11"  (1.803 m)  Wt 83.5 kg (184 lb 1.4 oz)  BMI 25.69 kg/m2  SpO2 95%  Intake/Output Summary (Last 24 hours) at 04/29/13 1212 Last data filed at 04/28/13 1900  Gross per 24 hour  Intake    280 ml  Output    400 ml  Net   -120 ml   Intake/Output: I/O last 3 completed shifts: In: 30 [P.O.:660; I.V.:160] Out: 750 [Urine:750]  Intake/Output this shift:    Weight change: 1.3 kg (2 lb 13.9 oz) [id number] comfortably CVS:no rub Resp:decreased Bs Abd:+BS, soft, NT Ext:no edema, LUE AVF +T/B   Recent Labs Lab 04/23/13 0400 04/24/13 1341 04/25/13 0425 04/26/13 0900 04/27/13 0434 04/28/13 0655 04/28/13 2055 04/29/13 0525  NA 141 141 142 139 139 142  --  138  K 3.7 3.7 3.4* 3.8 4.0 4.5  --  4.7  CL 94* 93* 94* 92* 90* 92*  --  92*  CO2 23 24 25 25 25 27   --  23  GLUCOSE 141* 138* 90 105* 269* 137*  --  256*  BUN 52* 48* 46* 46* 52* 56*  --  63*  CREATININE 2.38* 2.22* 2.19* 2.25* 2.44* 2.60* 2.80* 2.82*  ALBUMIN  --   --  1.8* 1.6* 1.7* 1.7*  --  1.5*  CALCIUM 6.0* 6.7* 6.6* 6.5* 6.7* 7.0*  --  6.7*  PHOS  --   --  5.5* 5.7* 6.3* 6.4*  --  6.5*   Liver Function Tests:  Recent Labs Lab 04/27/13 0434 04/28/13 0655 04/28/13 2055 04/29/13 0525  PROT  --   --  6.7  --   ALBUMIN 1.7* 1.7*  --  1.5*   No results found for this basename: LIPASE, AMYLASE,  in the last 168 hours No results found for this basename: AMMONIA,  in the last 168 hours CBC:  Recent Labs Lab 04/23/13 0400  04/27/13 0915 04/27/13 1655 04/28/13 0440 04/28/13 2055 04/29/13 0525  WBC 10.2  < > 13.7* 14.7* 13.7* 13.1* 8.6  NEUTROABS 8.0*  --   --   --   --   --   --   HGB 7.6*  < > 8.1* 7.4* 7.9* 7.2* 7.0*  HCT 24.5*  < > 26.6* 24.4* 26.1* 23.5* 25.7*  MCV 74.2*  < > 76.9* 75.5* 77.0* 75.3* 75.8*   PLT 302  < > 302 247 266 239 170  < > = values in this interval not displayed. Cardiac Enzymes:  Recent Labs Lab 04/27/13 1655  TROPONINI <0.30   CBG:  Recent Labs Lab 04/27/13 2118 04/28/13 0748 04/28/13 1719 04/28/13 2131 04/29/13 0811  GLUCAP 168* 138* 200* 228* 199*    Iron Studies: No results found for this basename: IRON, TIBC, TRANSFERRIN, FERRITIN,  in the last 72 hours Studies/Results: Dg Chest 1 View  04/29/2013   CLINICAL DATA:  Thoracentesis  EXAM: CHEST - 1 VIEW  COMPARISON:  DG CHEST 2 VIEW dated 04/27/2013  FINDINGS: The heart remains moderately enlarged. Left pleural effusion resolve after the thoracentesis. No ensuing pneumothorax. Overall, vascular congestion and edema have also improved. Right pleural effusion has probably also improved.  IMPRESSION: No pneumothorax post left thoracentesis.   Electronically Signed   By: Maryclare Bean M.D.   On: 04/29/2013 11:27   Dg  Fluoro Guide Cv Line-no Report  04/28/2013   CLINICAL DATA: Gordy Councilman placement   FLOURO GUIDE CV LINE  Fluoroscopy was utilized by the requesting physician.  No radiographic  interpretation.    Marland Kitchen antiseptic oral rinse  15 mL Mouth Rinse BID  . aspirin EC  81 mg Oral Daily  . atorvastatin  80 mg Oral q1800  . calcium acetate  1,334 mg Oral TID WC  . calcium gluconate  1 g Intravenous Once  . collagenase   Topical Daily  . enoxaparin (LOVENOX) injection  30 mg Subcutaneous Q24H  . feeding supplement (GLUCERNA SHAKE)  237 mL Oral BID BM  . furosemide  40 mg Oral Daily  . hydrALAZINE  25 mg Oral 3 times per day  . insulin aspart  0-9 Units Subcutaneous TID WC  . insulin glargine  15 Units Subcutaneous QHS  . isosorbide dinitrate  40 mg Oral BID  . levothyroxine  150 mcg Oral QAC breakfast  . metoprolol  100 mg Oral BID  . multivitamin with minerals  1 tablet Oral Daily  . mycophenolate  720 mg Oral BID  . pantoprazole  40 mg Oral Daily  . piperacillin-tazobactam (ZOSYN)  IV  3.375 g Intravenous  3 times per day  . sirolimus  2 mg Oral q morning - 10a  . sodium chloride  3 mL Intravenous Q12H  . sodium chloride  3 mL Intravenous Q12H  . sodium chloride  3 mL Intravenous Q12H  . [START ON 04/30/2013] vancomycin  1,000 mg Intravenous Q24H    BMET    Component Value Date/Time   NA 138 04/29/2013 0525   K 4.7 04/29/2013 0525   CL 92* 04/29/2013 0525   CO2 23 04/29/2013 0525   GLUCOSE 256* 04/29/2013 0525   BUN 63* 04/29/2013 0525   CREATININE 2.82* 04/29/2013 0525   CALCIUM 6.7* 04/29/2013 0525   GFRNONAA 23* 04/29/2013 0525   GFRAA 26* 04/29/2013 0525   CBC    Component Value Date/Time   WBC 8.6 04/29/2013 0525   RBC 3.39* 04/29/2013 0525   RBC 3.35* 03/27/2013 0930   HGB 7.0* 04/29/2013 0525   HCT 25.7* 04/29/2013 0525   PLT 170 04/29/2013 0525   MCV 75.8* 04/29/2013 0525   MCH 20.6* 04/29/2013 0525   MCHC 27.2* 04/29/2013 0525   RDW 21.8* 04/29/2013 0525   LYMPHSABS 1.7 04/23/2013 0400   MONOABS 0.4 04/23/2013 0400   EOSABS 0.1 04/23/2013 0400   BASOSABS 0.0 04/23/2013 0400     Assessment/Plan:  1. 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 Scr stable, cont with rapamune/myfortic (tremors may be from elevated rapamune level, will check as he had been on dilt for a few days).  1. Will follow levels and hold dilt. 2. Bump in Scr after diuresis, agree with holding lasix and follow renal function.  3. Discussed possible role for dialysis with his wife, however will hold off and follow BUN/Cr on lower dose of lasix 2. pulm edema/volume excess- not diuresing well over the last 2 days.   1. Decrease diuretics and follow BUN/Cr.  2. S/p thoracentesis and await studies 3. CHF systolic (EF 16%) 4. Prostate Ca - ?spinal mets s/p CT guided biopsy without definitive answer.  F/u with Urology.   5. CAD s/p PTA with stent, + myoview but no cardiac cath due to CKD 6. SHP and hyperphosphatemia- have started Calcium acetate and follow 7. Severe protein malnutrition- will need  protein supplementation 8. Anemia- no  on ESA and has h/o prostate CA. Transfuse prn. 9. Atrial tachycardia- on po MTP and dilt 10. Deconditioning- may benefit from PT/OT eval and possible SNF vs. CIR stay 11.   Thrall A

## 2013-04-30 DIAGNOSIS — M949 Disorder of cartilage, unspecified: Secondary | ICD-10-CM

## 2013-04-30 DIAGNOSIS — M899 Disorder of bone, unspecified: Secondary | ICD-10-CM

## 2013-04-30 LAB — CBC
HCT: 24.8 % — ABNORMAL LOW (ref 39.0–52.0)
HEMOGLOBIN: 7.7 g/dL — AB (ref 13.0–17.0)
MCH: 23.6 pg — AB (ref 26.0–34.0)
MCHC: 31 g/dL (ref 30.0–36.0)
MCV: 76.1 fL — ABNORMAL LOW (ref 78.0–100.0)
PLATELETS: 184 10*3/uL (ref 150–400)
RBC: 3.26 MIL/uL — ABNORMAL LOW (ref 4.22–5.81)
RDW: 20.6 % — ABNORMAL HIGH (ref 11.5–15.5)
WBC: 9.9 10*3/uL (ref 4.0–10.5)

## 2013-04-30 LAB — TYPE AND SCREEN
ABO/RH(D): O POS
Antibody Screen: NEGATIVE
Unit division: 0

## 2013-04-30 LAB — GLUCOSE, CAPILLARY
Glucose-Capillary: 160 mg/dL — ABNORMAL HIGH (ref 70–99)
Glucose-Capillary: 151 mg/dL — ABNORMAL HIGH (ref 70–99)
Glucose-Capillary: 153 mg/dL — ABNORMAL HIGH (ref 70–99)
Glucose-Capillary: 113 mg/dL — ABNORMAL HIGH (ref 70–99)
Glucose-Capillary: 126 mg/dL — ABNORMAL HIGH (ref 70–99)

## 2013-04-30 LAB — RENAL FUNCTION PANEL
Albumin: 1.5 g/dL — ABNORMAL LOW (ref 3.5–5.2)
BUN: 64 mg/dL — ABNORMAL HIGH (ref 6–23)
CO2: 28 mEq/L (ref 19–32)
Calcium: 6.5 mg/dL — ABNORMAL LOW (ref 8.4–10.5)
Chloride: 90 mEq/L — ABNORMAL LOW (ref 96–112)
Creatinine, Ser: 2.88 mg/dL — ABNORMAL HIGH (ref 0.50–1.35)
GFR calc Af Amer: 26 mL/min — ABNORMAL LOW (ref >90)
GFR calc non Af Amer: 22 mL/min — ABNORMAL LOW (ref >90)
Glucose, Bld: 194 mg/dL — ABNORMAL HIGH (ref 70–99)
PHOSPHORUS: 5.4 mg/dL — AB (ref 2.3–4.6)
POTASSIUM: 4.3 meq/L (ref 3.7–5.3)
SODIUM: 137 meq/L (ref 137–147)

## 2013-04-30 MED ORDER — SIROLIMUS 1 MG PO TABS
1.0000 mg | ORAL_TABLET | Freq: Every morning | ORAL | Status: DC
  Administered 2013-04-30 – 2013-05-14 (×11): 1 mg via ORAL
  Filled 2013-04-30 (×16): qty 1

## 2013-04-30 MED ORDER — INSULIN GLARGINE 100 UNIT/ML ~~LOC~~ SOLN
17.0000 [IU] | Freq: Every day | SUBCUTANEOUS | Status: DC
  Administered 2013-04-30 – 2013-05-02 (×2): 17 [IU] via SUBCUTANEOUS
  Filled 2013-04-30 (×4): qty 0.17

## 2013-04-30 MED ORDER — PIPERACILLIN-TAZOBACTAM 3.375 G IVPB
3.3750 g | Freq: Three times a day (TID) | INTRAVENOUS | Status: AC
  Administered 2013-04-30 – 2013-05-05 (×15): 3.375 g via INTRAVENOUS
  Filled 2013-04-30 (×18): qty 50

## 2013-04-30 MED ORDER — AMIODARONE HCL 200 MG PO TABS
200.0000 mg | ORAL_TABLET | Freq: Two times a day (BID) | ORAL | Status: DC
  Administered 2013-04-30 – 2013-05-13 (×26): 200 mg via ORAL
  Filled 2013-04-30 (×30): qty 1

## 2013-04-30 MED ORDER — CALCIUM ACETATE 667 MG PO CAPS
2001.0000 mg | ORAL_CAPSULE | Freq: Three times a day (TID) | ORAL | Status: DC
  Administered 2013-04-30 – 2013-05-14 (×35): 2001 mg via ORAL
  Filled 2013-04-30 (×49): qty 3

## 2013-04-30 MED ORDER — PIPERACILLIN-TAZOBACTAM 3.375 G IVPB
3.3750 g | Freq: Three times a day (TID) | INTRAVENOUS | Status: DC
Start: 2013-05-01 — End: 2013-04-30
  Filled 2013-04-30: qty 50

## 2013-04-30 MED ORDER — CALCIUM CARBONATE ANTACID 500 MG PO CHEW
800.0000 mg | CHEWABLE_TABLET | Freq: Every day | ORAL | Status: DC
Start: 2013-04-30 — End: 2013-05-15
  Administered 2013-04-30 – 2013-05-13 (×14): 800 mg via ORAL
  Filled 2013-04-30 (×17): qty 4

## 2013-04-30 NOTE — Progress Notes (Signed)
  Date: 04/30/2013  Patient name: Joshua [name]  Medical record number: [id number]  Date of birth: 1950/12/30   This patient has been seen and the plan of care was discussed with the house staff. Please see their note for complete details. I concur with their findings with the following additions/corrections:  Case discussed with resident team and nephrology.    Thayer Headings, MD 04/30/2013, 9:08 AM

## 2013-04-30 NOTE — Progress Notes (Signed)
Subjective: Denies CP, n/v/sob.    Tele: still with PVCs, Nonsustained VT, SVT  Objective: Vital signs in last 24 hours: Filed Vitals:   04/30/13 0230 04/30/13 0339 04/30/13 0600 04/30/13 0735  BP:  113/65  123/55  Pulse:    88  Temp:    99.1 F (37.3 C)  TempSrc:    Oral  Resp: 22 22 19 22   Height:      Weight:   184 lb 8.4 oz (83.7 kg)   SpO2:  98%  96%   Weight change: 7.1 oz (0.2 kg)  Intake/Output Summary (Last 24 hours) at 04/30/13 0903 Last data filed at 04/30/13 0600  Gross per 24 hour  Intake    635 ml  Output    750 ml  Net   -115 ml   Vitals reviewed. General: resting in bed, NAD HEENT: [place]/at, no scleral icterus Cardiac: RRR, no rubs, murmurs or gallops Pulm: +wheezing b/l  Abd: soft, nontender, nondistended, BS present Ext: warm and well perfused, no pedal edema Neuro: alert and oriented X3, cranial nerves II-XII grossly intact, moving all 4 extremities   Lab Results: Basic Metabolic Panel:  Recent Labs Lab 04/27/13 0434 04/27/13 0915  04/29/13 0525 04/30/13 0215  NA 139  --   < > 138 137  K 4.0  --   < > 4.7 4.3  CL 90*  --   < > 92* 90*  CO2 25  --   < > 23 28  GLUCOSE 269*  --   < > 256* 194*  BUN 52*  --   < > 63* 64*  CREATININE 2.44*  --   < > 2.82* 2.88*  CALCIUM 6.7*  --   < > 6.7* 6.5*  MG  --  2.0  --   --   --   PHOS 6.3*  --   < > 6.5* 5.4*  < > = values in this interval not displayed. Liver Function Tests:  Recent Labs Lab 04/28/13 2055 04/29/13 0525 04/30/13 0215  PROT 6.7  --   --   ALBUMIN  --  1.5* 1.5*   CBC:  Recent Labs Lab 04/29/13 1540 04/30/13 0215  WBC 9.6 9.9  HGB 6.8* 7.7*  HCT 21.9* 24.8*  MCV 74.7* 76.1*  PLT 209 184   Cardiac Enzymes:  Recent Labs Lab 04/27/13 1655  TROPONINI <0.30  CBG:  Recent Labs Lab 04/28/13 2131 04/29/13 0811 04/29/13 1143 04/29/13 1719 04/29/13 2346 04/30/13 0729  GLUCAP 228* 199* 156* 173* 190* 160*    Thyroid Function Tests:  Recent Labs Lab  04/29/13 1540  TSH 37.669*   Coagulation:  Recent Labs Lab 04/26/13 0414  LABPROT 16.6*  INR 1.38   Misc. Labs: Cultures  thoracentesis studies   Micro Results: Recent Results (from the past 240 hour(s))  CULTURE, BLOOD (ROUTINE X 2)     Status: None   Collection Time    04/28/13 11:40 AM      Result Value Ref Range Status   Specimen Description BLOOD RIGHT HAND   Final   Special Requests BOTTLES DRAWN AEROBIC ONLY 2CC   Final   Culture  Setup Time     Final   Value: 04/28/2013 16:51     Performed at [facility]   Culture     Final   Value:        BLOOD CULTURE RECEIVED NO GROWTH TO DATE CULTURE WILL BE HELD FOR 5 DAYS BEFORE ISSUING A FINAL NEGATIVE  REPORT     Performed at [facility]   Report Status PENDING   Incomplete  BODY FLUID CULTURE     Status: None   Collection Time    04/29/13 10:54 AM      Result Value Ref Range Status   Specimen Description FLUID LEFT PLEURAL   Final   Special Requests Immunocompromised   Final   Gram Stain     Final   Value: WBC PRESENT,BOTH PMN AND MONONUCLEAR     NO ORGANISMS SEEN     CYTOSPIN     Performed at [facility]   Culture PENDING   Incomplete   Report Status PENDING   Incomplete   Studies/Results: Dg Chest 1 View  04/29/2013   CLINICAL DATA:  Thoracentesis  EXAM: CHEST - 1 VIEW  COMPARISON:  DG CHEST 2 VIEW dated 04/27/2013  FINDINGS: The heart remains moderately enlarged. Left pleural effusion resolve after the thoracentesis. No ensuing pneumothorax. Overall, vascular congestion and edema have also improved. Right pleural effusion has probably also improved.  IMPRESSION: No pneumothorax post left thoracentesis.   Electronically Signed   By: Maryclare Bean M.D.   On: 04/29/2013 11:27   Dg Fluoro Guide Cv Line-no Report  04/28/2013   CLINICAL DATA: Gordy Councilman placement   FLOURO GUIDE CV LINE  Fluoroscopy was utilized by the requesting physician.  No radiographic  interpretation.    US Thoracentesis Asp  Pleural Space W/img Guide  04/29/2013   CLINICAL DATA:  Patient with history of dyspnea, CHF, bilateral pleural effusions, coronary artery disease, chronic kidney disease, prostate cancer ; request is made for left thoracentesis.  EXAM: ULTRASOUND GUIDED DIAGNOSTIC AND THERAPEUTIC LEFT THORACENTESIS  COMPARISON:  None.  FINDINGS: A total of approximately 780 cc's of yellow fluid was removed. The fluid sample was sent for laboratory analysis.  IMPRESSION: Successful ultrasound guided diagnostic/therapeutic left thoracentesis yielding 780 cc's of pleural fluid.  Read by: Rowe Robert ,P.A.-C.  PROCEDURE: An ultrasound guided thoracentesis was thoroughly discussed with the patient and questions answered. The benefits, risks, alternatives and complications were also discussed. The patient understands and wishes to proceed with the procedure. Written consent was obtained.  Ultrasound was performed to localize and mark an adequate pocket of fluid in the left chest. The area was then prepped and draped in the normal sterile fashion. 1% Lidocaine was used for local anesthesia. Under ultrasound guidance a 19 gauge Yueh catheter was introduced. Thoracentesis was performed. The catheter was removed and a dressing applied.  Complications:  none   Electronically Signed   By: Maryclare Bean M.D.   On: 04/29/2013 11:11   Medications: Scheduled Meds: . antiseptic oral rinse  15 mL Mouth Rinse BID  . aspirin EC  81 mg Oral Daily  . atorvastatin  80 mg Oral q1800  . calcium acetate  1,334 mg Oral TID WC  . collagenase   Topical Daily  . enoxaparin (LOVENOX) injection  30 mg Subcutaneous Q24H  . feeding supplement (GLUCERNA SHAKE)  237 mL Oral BID BM  . furosemide  40 mg Oral Daily  . hydrALAZINE  25 mg Oral 3 times per day  . insulin aspart  0-9 Units Subcutaneous TID WC  . insulin glargine  17 Units Subcutaneous QHS  . isosorbide dinitrate  40 mg Oral BID  . levothyroxine  150 mcg Oral QAC breakfast  . metoprolol  100  mg Oral BID  . multivitamin with minerals  1 tablet Oral Daily  . mycophenolate  720 mg Oral BID  . pantoprazole  40 mg Oral Daily  . piperacillin-tazobactam (ZOSYN)  IV  3.375 g Intravenous 3 times per day  . sirolimus  2 mg Oral q morning - 10a  . vancomycin  1,000 mg Intravenous Q24H   Continuous Infusions: . sodium chloride 0 mL/hr at 04/29/13 0700   PRN Meds:.acetaminophen, albuterol, diphenhydrAMINE, oxyCODONE  04/22/13 echo Study Conclusions  - Left ventricle: Systolic function was moderately to severely reduced. The estimated ejection fraction was in the range of 30% to 35%. Diffuse hypokinesis. Moderate hypokinesis of the inferior myocardium. - Aortic valve: There was very mild stenosis. Valve area: 1.22cm^2(VTI). Valve area: 1.37cm^2 (Vmax). - Right ventricle: The cavity size was mildly decreased. - Pulmonary arteries: Systolic pressure was mildly increased. PA peak pressure: 73mm Hg (S). - Pericardium, extracardiac: A trivial pericardial effusion was identified.  04/28/13 right heart cath  Findings:  RA = 9  RV = 45/14/14  PA = 43/19 (27)  PCW = 12  Fick cardiac output/index = 5.4/2.7  Thermo CO/CI = 8.25/4.1  PVR = 2.8 WU  FA sat = 98%  PA sat = 50%, 53%  Assessment:  1. Filling pressures relatively normal  2. Mixed venous sat is on low end but cardiac output calculates as normal. (Cardiac output may be artificially increased due to presence of AVF)  Plan/Discussion:  Suspect main issue is renal and he may also be developing uremic symptoms. No role for IV inotropes currently.  I will check in on him again on Monday. Please don't hesitate to call me over the weekend.    Assessment/Plan: 63 y.o complicated medical history carotid artery occlusion, stroke, aortic stenosis, PVD, hypothyroidism, HLD, HTN, DM, CKD s/p renal transplant, CHF, prostate cancer with ?mets, CAD s/p stents with history of plavix resistance, anemia, atrial tachycardia, sacral ulcer.     #SVT  -asymptomatic. Still with SVT, multiple PVCs, nonsustained VT on tele noted 2/22 -Lopressor 100 mg bid  -monitor tele  -if do Amiodarone start with low dose b/c DDI with Sirolimus. Started Amio 200 mg po bid x a couple weeks then change to 200 mg qd   #Acute on chronic combined systolic and diastolic CHF (congestive heart failure) -EF 30-35% 04/2013 -s/p right heart cath  -Lasix 40 mg qd, Isordil 40 mg bid, Lopressor 100 mg bid, statin  -strict i/o, daily weights   #CAD -Aspirin 81, statin (Lipitor 80 mg)  #Atrial tachycardia  -monitor via tele  #HLD Lipid Panel     Component Value Date/Time   CHOL 122 01/12/2013 0902   TRIG 126.0 01/12/2013 0902   HDL 37.00* 01/12/2013 0902   CHOLHDL 3 01/12/2013 0902   VLDL 25.2 01/12/2013 0902   LDLCALC 60 01/12/2013 0902    #AKI on CKD s/p transplant -renal following. On Sirolimus decreased dose 1 mg daily b/c levels were too high -strict i/o, daily weights  -Phoslo   #Anemia -renal following   #Pulmonary edema -b/l pleural effusions. s/p left thoracentesis 2/21 with 780 ccs yellow fluid. F/u CXR wnl no complications  -per primary   #HAP -pending Cxs -Vanc/Zosyn per pharm  -Albuterol neb 14 hours   #H/o prostate cancer -Bx suspcious for mets   #HTN -monitor BP  #DM2  -lantus, SSI-S -monitor cbgs   #Hypothyroidism  -Synthyroid 150 mcg   #Malnutrition   #F/E/N -NSL -trend electrolytes  -carb mod diet   DVT px  -scds, Lovenox     LOS: 9 days   Cresenciano Genre,  MD 04/30/2013, 9:03 AM As above, patient seen and examined. He denies chest pain or dyspnea. He continues to have SVT with rates of 150 despite metoprolol. I will add amiodarone 200 mg by mouth twice a day for 2 weeks and then 200 mg daily. Discussed with Dr Marval Regal and he suggested lower dose given the potential interaction with immunosuppressants. I am hesitant to add this medication particularly in light of hypothyroidism and elevated TSH.  However alternative options are limited. He would not be a candidate for a class IC agent given coronary artery disease. He is not a candidate for tikosyn given renal insufficiency. His Synthroid should be adjusted by primary care and this will need close followup as an outpatient. I will hold diuretics today. Kirk Ruths 10:06 AM

## 2013-04-30 NOTE — Progress Notes (Signed)
Patient ID: Joshua [name], male   DOB: 1950/08/08, 63 y.o.   MRN: [id number] S:Pt more awake and alert O:BP 123/55  Pulse 88  Temp(Src) 99.1 F (37.3 C) (Oral)  Resp 22  Ht 5\' 11"  (1.803 m)  Wt 83.7 kg (184 lb 8.4 oz)  BMI 25.75 kg/m2  SpO2 96%  Intake/Output Summary (Last 24 hours) at 04/30/13 0912 Last data filed at 04/30/13 0600  Gross per 24 hour  Intake    635 ml  Output    750 ml  Net   -115 ml   Intake/Output: I/O last 3 completed shifts: In: 635 [Blood:335; IV Piggyback:300] Out: 750 [Urine:750]  Intake/Output this shift:    Weight change: 0.2 kg (7.1 oz) Gen:WD WN AAM in NAD with occ myoclonic jerking CVS:IRR no rub Resp:cta with decreased BS at bases Abd:+BS, soft, NT/ND Ext:no edema, LUE AVF +T/B   Recent Labs Lab 04/24/13 1341 04/25/13 0425 04/26/13 0900 04/27/13 0434 04/28/13 0655 04/28/13 2055 04/29/13 0525 04/30/13 0215  NA 141 142 139 139 142  --  138 137  K 3.7 3.4* 3.8 4.0 4.5  --  4.7 4.3  CL 93* 94* 92* 90* 92*  --  92* 90*  CO2 24 25 25 25 27   --  23 28  GLUCOSE 138* 90 105* 269* 137*  --  256* 194*  BUN 48* 46* 46* 52* 56*  --  63* 64*  CREATININE 2.22* 2.19* 2.25* 2.44* 2.60* 2.80* 2.82* 2.88*  ALBUMIN  --  1.8* 1.6* 1.7* 1.7*  --  1.5* 1.5*  CALCIUM 6.7* 6.6* 6.5* 6.7* 7.0*  --  6.7* 6.5*  PHOS  --  5.5* 5.7* 6.3* 6.4*  --  6.5* 5.4*   Liver Function Tests:  Recent Labs Lab 04/28/13 0655 04/28/13 2055 04/29/13 0525 04/30/13 0215  PROT  --  6.7  --   --   ALBUMIN 1.7*  --  1.5* 1.5*   No results found for this basename: LIPASE, AMYLASE,  in the last 168 hours No results found for this basename: AMMONIA,  in the last 168 hours CBC:  Recent Labs Lab 04/28/13 0440 04/28/13 2055 04/29/13 0525 04/29/13 1540 04/30/13 0215  WBC 13.7* 13.1* 8.6 9.6 9.9  HGB 7.9* 7.2* 7.0* 6.8* 7.7*  HCT 26.1* 23.5* 25.7* 21.9* 24.8*  MCV 77.0* 75.3* 75.8* 74.7* 76.1*  PLT 266 239 170 209 184   Cardiac Enzymes:  Recent Labs Lab  04/27/13 1655  TROPONINI <0.30   CBG:  Recent Labs Lab 04/29/13 0811 04/29/13 1143 04/29/13 1719 04/29/13 2346 04/30/13 0729  GLUCAP 199* 156* 173* 190* 160*    Iron Studies: No results found for this basename: IRON, TIBC, TRANSFERRIN, FERRITIN,  in the last 72 hours Studies/Results: Dg Chest 1 View  04/29/2013   CLINICAL DATA:  Thoracentesis  EXAM: CHEST - 1 VIEW  COMPARISON:  DG CHEST 2 VIEW dated 04/27/2013  FINDINGS: The heart remains moderately enlarged. Left pleural effusion resolve after the thoracentesis. No ensuing pneumothorax. Overall, vascular congestion and edema have also improved. Right pleural effusion has probably also improved.  IMPRESSION: No pneumothorax post left thoracentesis.   Electronically Signed   By: Maryclare Bean M.D.   On: 04/29/2013 11:27   Dg Fluoro Guide Cv Line-no Report  04/28/2013   CLINICAL DATA: Gordy Councilman placement   FLOURO GUIDE CV LINE  Fluoroscopy was utilized by the requesting physician.  No radiographic  interpretation.    US Thoracentesis Asp Pleural Space W/img Guide  04/29/2013  CLINICAL DATA:  Patient with history of dyspnea, CHF, bilateral pleural effusions, coronary artery disease, chronic kidney disease, prostate cancer ; request is made for left thoracentesis.  EXAM: ULTRASOUND GUIDED DIAGNOSTIC AND THERAPEUTIC LEFT THORACENTESIS  COMPARISON:  None.  FINDINGS: A total of approximately 780 cc's of yellow fluid was removed. The fluid sample was sent for laboratory analysis.  IMPRESSION: Successful ultrasound guided diagnostic/therapeutic left thoracentesis yielding 780 cc's of pleural fluid.  Read by: Rowe Robert ,P.A.-C.  PROCEDURE: An ultrasound guided thoracentesis was thoroughly discussed with the patient and questions answered. The benefits, risks, alternatives and complications were also discussed. The patient understands and wishes to proceed with the procedure. Written consent was obtained.  Ultrasound was performed to localize and mark  an adequate pocket of fluid in the left chest. The area was then prepped and draped in the normal sterile fashion. 1% Lidocaine was used for local anesthesia. Under ultrasound guidance a 19 gauge Yueh catheter was introduced. Thoracentesis was performed. The catheter was removed and a dressing applied.  Complications:  none   Electronically Signed   By: Maryclare Bean M.D.   On: 04/29/2013 11:11   . antiseptic oral rinse  15 mL Mouth Rinse BID  . aspirin EC  81 mg Oral Daily  . atorvastatin  80 mg Oral q1800  . calcium acetate  1,334 mg Oral TID WC  . collagenase   Topical Daily  . enoxaparin (LOVENOX) injection  30 mg Subcutaneous Q24H  . feeding supplement (GLUCERNA SHAKE)  237 mL Oral BID BM  . furosemide  40 mg Oral Daily  . hydrALAZINE  25 mg Oral 3 times per day  . insulin aspart  0-9 Units Subcutaneous TID WC  . insulin glargine  17 Units Subcutaneous QHS  . isosorbide dinitrate  40 mg Oral BID  . levothyroxine  150 mcg Oral QAC breakfast  . metoprolol  100 mg Oral BID  . multivitamin with minerals  1 tablet Oral Daily  . mycophenolate  720 mg Oral BID  . pantoprazole  40 mg Oral Daily  . piperacillin-tazobactam (ZOSYN)  IV  3.375 g Intravenous 3 times per day  . sirolimus  2 mg Oral q morning - 10a  . vancomycin  1,000 mg Intravenous Q24H    BMET    Component Value Date/Time   NA 137 04/30/2013 0215   K 4.3 04/30/2013 0215   CL 90* 04/30/2013 0215   CO2 28 04/30/2013 0215   GLUCOSE 194* 04/30/2013 0215   BUN 64* 04/30/2013 0215   CREATININE 2.88* 04/30/2013 0215   CALCIUM 6.5* 04/30/2013 0215   GFRNONAA 22* 04/30/2013 0215   GFRAA 26* 04/30/2013 0215   CBC    Component Value Date/Time   WBC 9.9 04/30/2013 0215   RBC 3.26* 04/30/2013 0215   RBC 3.35* 03/27/2013 0930   HGB 7.7* 04/30/2013 0215   HCT 24.8* 04/30/2013 0215   PLT 184 04/30/2013 0215   MCV 76.1* 04/30/2013 0215   MCH 23.6* 04/30/2013 0215   MCHC 31.0 04/30/2013 0215   RDW 20.6* 04/30/2013 0215   LYMPHSABS 1.7 04/23/2013  0400   MONOABS 0.4 04/23/2013 0400   EOSABS 0.1 04/23/2013 0400   BASOSABS 0.0 04/23/2013 0400     Assessment/Plan:  1. 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 Scr stable, cont with rapamune/myfortic (tremors may be from elevated rapamune level, will check as he had been on dilt for a few days).  1. Will follow levels and hold dilt.  2. Decrease Sirolimus to 1mg  daily and follow 3. Bump in Scr after diuresis, agree with holding lasix and follow renal function.  4. Discussed possible role for dialysis with his wife, however will hold off and follow BUN/Cr on lower dose of lasix 1. Would hold lasix for today if ok with primary team and cards 2. pulm edema/volume excess- not diuresing well over the last 2 days.  1. Swann-ganz cath pt is euvolemic 2. S/p thoracentesis and await studies 3. CHF systolic (EF 65%)- stable 4. SVT- if pt to be started on amiodarone, would recommend low dose given interaction with sirolimus. 5. Prostate Ca - ?spinal mets s/p CT guided biopsy without definitive answer. F/u with Urology.  6. CAD s/p PTA with stent, + myoview but no cardiac cath due to CKD 7. SHP and hyperphosphatemia- have started Calcium acetate and follow 1. Increase Calcium acetate dose to 3 qac tid and follow 8. Severe protein malnutrition- will need protein supplementation 9. Anemia- not on ESA and has h/o prostate CA. Transfuse prn.   1. Would recommend epo if ok with urology 10. Deconditioning- may benefit from PT/OT eval and possible SNF vs. CIR stay 11.   Muldrow A

## 2013-04-30 NOTE — Progress Notes (Addendum)
Subjective: Overnight Joshua [name] had non-sustained VT, SVT and PVCs on telemetry.  He was seen and examined this morning.  He is more alert this morning than yesterday.  Denies CP/SOB.   Objective: Vital signs in last 24 hours: Filed Vitals:   04/30/13 0230 04/30/13 0339 04/30/13 0600 04/30/13 0735  BP:  113/65  123/55  Pulse:    88  Temp:    99.1 F (37.3 C)  TempSrc:    Oral  Resp: 22 22 19 22   Height:      Weight:   83.7 kg (184 lb 8.4 oz)   SpO2:  98%  96%   Weight change: 0.2 kg (7.1 oz)  Intake/Output Summary (Last 24 hours) at 04/30/13 1034 Last data filed at 04/30/13 0600  Gross per 24 hour  Intake    635 ml  Output    750 ml  Net   -115 ml   General: Laying flat in bed in NAD, tremulous, appearance improved from yesterday. Cardiac: RRR, no rubs, murmurs or gallops Pulm: wheezing but no crackles appreciated Abd: nondistended, soft, BS present, non-tender GU:  ~ 100cc clear urine output in catheter bag Ext: warm and well perfused, no lower extremity edema Neuro: alert and oriented X3  Lab Results: Basic Metabolic Panel:  Recent Labs Lab 04/27/13 0434 04/27/13 0915  04/29/13 0525 04/30/13 0215  NA 139  --   < > 138 137  K 4.0  --   < > 4.7 4.3  CL 90*  --   < > 92* 90*  CO2 25  --   < > 23 28  GLUCOSE 269*  --   < > 256* 194*  BUN 52*  --   < > 63* 64*  CREATININE 2.44*  --   < > 2.82* 2.88*  CALCIUM 6.7*  --   < > 6.7* 6.5*  MG  --  2.0  --   --   --   PHOS 6.3*  --   < > 6.5* 5.4*  < > = values in this interval not displayed. Liver Function Tests:  Recent Labs Lab 04/28/13 2055 04/29/13 0525 04/30/13 0215  PROT 6.7  --   --   ALBUMIN  --  1.5* 1.5*   CBC:  Recent Labs Lab 04/29/13 1540 04/30/13 0215  WBC 9.6 9.9  HGB 6.8* 7.7*  HCT 21.9* 24.8*  MCV 74.7* 76.1*  PLT 209 184   CBG:  Recent Labs Lab 04/28/13 2131 04/29/13 0811 04/29/13 1143 04/29/13 1719 04/29/13 2346 04/30/13 0729  GLUCAP 228* 199* 156* 173* 190* 160*    Medications: I have reviewed the patient's current medications. Scheduled Meds: . amiodarone  200 mg Oral BID  . antiseptic oral rinse  15 mL Mouth Rinse BID  . aspirin EC  81 mg Oral Daily  . atorvastatin  80 mg Oral q1800  . calcium acetate  2,001 mg Oral TID WC  . calcium carbonate  800 mg of elemental calcium Oral QHS  . collagenase   Topical Daily  . enoxaparin (LOVENOX) injection  30 mg Subcutaneous Q24H  . feeding supplement (GLUCERNA SHAKE)  237 mL Oral BID BM  . hydrALAZINE  25 mg Oral 3 times per day  . insulin aspart  0-9 Units Subcutaneous TID WC  . insulin glargine  17 Units Subcutaneous QHS  . isosorbide dinitrate  40 mg Oral BID  . levothyroxine  150 mcg Oral QAC breakfast  . metoprolol  100 mg Oral BID  .  multivitamin with minerals  1 tablet Oral Daily  . mycophenolate  720 mg Oral BID  . pantoprazole  40 mg Oral Daily  . piperacillin-tazobactam (ZOSYN)  IV  3.375 g Intravenous 3 times per day  . sirolimus  1 mg Oral q morning - 10a  . vancomycin  1,000 mg Intravenous Q24H   Continuous Infusions: . sodium chloride 0 mL/hr at 04/29/13 0700   PRN Meds:.acetaminophen, albuterol, diphenhydrAMINE, oxyCODONE  Assessment/Plan: 63 year old male with a PMH of HTN, DM type 2, CAD (s/p MI and LAD stent), kidney transplantation, carotid artery stenosis (s/p CEA 08/2011), PVD (s/p fem-pop in 1980), dHF (EF 45-50% Jan 2015), hx of papillary thyroid cancer and prostate cancer who presents with complaint of edema x 3 days and dyspnea for several weeks.   Volume overload, resolved:  Was likely 2/2 worsening CHF in the setting of CKD about 13ys s/p renal transplant.  CXR with small bilateral pleural effusions.  Worsening BUN and Cr and decreased urine output despite increased IV Lasix.  Right heart cath on 02/20 revealed normal PCWP and normal CO.   Euvolemic at present.  Dry weight appears to be 180 pounds.  - holding Lasix 40mg  po per nephrology - continue Coreg 100mg  BID,  hydralazine 25mg  q8h; isordil 40mg  BID - daily weights and I&Os  - transfer to floor with telemetry  B/L pleural effusions:  In light of cath findings, the effusions seen on CXR may be due to pneumonia.  Antibiotics were started on 02/20 and his dyspnea has improved.  Thoracentesis completed 02/22 and yeilded 780cc of yellow fluid.   - pleural fluid analysis pending - oxygen supplementation prn - incentive spirometer  - albuterol nebs q4h - continue vancomycin and zosyn per pharmacy  HCAP:  Fevers, leukocytosis and possible LLL infiltrate on CXR in an immunosuppressed patient.  He has been afebrile for the past 24 hours.  Leukocytosis improving. - oxygen supplementation prn - blood cultures pending - continue Vancomycin and Zosyn per pharmacy - albuterol nebs q4h  SVT:  Asymptomatic - monitor on telemetry - continue metoprolol  - amiodarone 200mg  BID added per cardiology; plan decrease to 200mg  daily in 2 weeks; low dose was started because there is potential for interaction with sirolimus.   - may need to adjust Synthroid  Chest pain:  Resolved.  Acute onset of 10/10 chest pain during nebulizer treatment yesterday.  No significant EKG findings. Troponin negative.  Possibly 2/2 to pneumonia.   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 Baseline Cr 1.9-2.5. Creatine 2.24 --> 2.6.  Concern for cardiorenal syndrome given worsening creatinine.  Sirolimus level elevated. - Continue anti-rejection meds - reduce sirolimus to 2mg  --> 1mg  qAM  T10 lesion noted on prior MRI:  concerning for mets from Prostate CA.  Followed by Dr. Diona Fanti (Urology).  - T10 bone biopsy and kyphoplasty done on 04/26/13.  Acute on chronic anemia: Stable. Hgb 7.7 s/p 1 unit PRBCs on 02/21. No active bleeding.  - AM CBC - consider addition of EPO  Hypocalcemia: 6.3 on admission and he received calcium gluconate.  Repeat on 12/15 was 6.1 w/ albumin 1.6. Corrected Ca was 8.0.  However ionized Calcium low at 0.71-->0.79. Replacing Calcium.  DM type 2:  Previously on 24 units Lantus at home, but wife reported hypoglycemic episodes, so she was administering 12 units over the past few days prior to admission.  BG 151-190 in past 24 hours.  - continue Lantus  17 units qHS  HTN:  Stable.  Continued lopressor, hydralazine, lasix, isordil.  H/o prostate cancer: Question of spinal mets at last hospitalization, reportedly seen on MRI, but negative bone scan 02/2013. Patient seen by Dr. Diona Fanti with Urology during prior hospitalization.  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  His back pain is being controlled with oxycodone and acetaminophen. - Continue Oxycodone IR 10mg  q 4h PRN and Tylenol  Unstageable pressure ulcer, sacral:  Patient aware of ulcer prior to admission.  Wound care consulted and recommendations appreciated. - continue debridement ointment and moist 2x2 - pressure redistribution pad   DVT ppx: SCDs and lovenox  Dispo: Disposition is deferred at this time, awaiting improvement of current medical problems.  Anticipated discharge in approximately 1-2 day(s).   The patient does have a current PCP (Sueanne Margarita, MD) and may need an [facility] hospital follow-up appointment after discharge.  The patient does not know have transportation limitations that hinder transportation to clinic appointments.  .Services Needed at time of discharge: Y = Yes, Blank = No PT:   OT:   RN:   Equipment:   Other:     LOS: 9 days   Duwaine Maxin, DO 04/30/2013, 10:34 AM

## 2013-05-01 DIAGNOSIS — I498 Other specified cardiac arrhythmias: Secondary | ICD-10-CM

## 2013-05-01 DIAGNOSIS — I255 Ischemic cardiomyopathy: Secondary | ICD-10-CM | POA: Diagnosis present

## 2013-05-01 DIAGNOSIS — I471 Supraventricular tachycardia: Secondary | ICD-10-CM

## 2013-05-01 DIAGNOSIS — J90 Pleural effusion, not elsewhere classified: Secondary | ICD-10-CM

## 2013-05-01 LAB — BASIC METABOLIC PANEL
BUN: 66 mg/dL — AB (ref 6–23)
CALCIUM: 6.5 mg/dL — AB (ref 8.4–10.5)
CHLORIDE: 90 meq/L — AB (ref 96–112)
CO2: 27 mEq/L (ref 19–32)
CREATININE: 3.02 mg/dL — AB (ref 0.50–1.35)
GFR calc non Af Amer: 21 mL/min — ABNORMAL LOW (ref >90)
GFR, EST AFRICAN AMERICAN: 24 mL/min — AB (ref >90)
Glucose, Bld: 92 mg/dL (ref 70–99)
Potassium: 4 mEq/L (ref 3.7–5.3)
Sodium: 138 mEq/L (ref 137–147)

## 2013-05-01 LAB — RENAL FUNCTION PANEL
Albumin: 1.5 g/dL — ABNORMAL LOW (ref 3.5–5.2)
BUN: 65 mg/dL — ABNORMAL HIGH (ref 6–23)
CALCIUM: 6.5 mg/dL — AB (ref 8.4–10.5)
CO2: 25 meq/L (ref 19–32)
Chloride: 91 mEq/L — ABNORMAL LOW (ref 96–112)
Creatinine, Ser: 2.86 mg/dL — ABNORMAL HIGH (ref 0.50–1.35)
GFR calc non Af Amer: 22 mL/min — ABNORMAL LOW (ref >90)
GFR, EST AFRICAN AMERICAN: 26 mL/min — AB (ref >90)
GLUCOSE: 110 mg/dL — AB (ref 70–99)
Phosphorus: 5.5 mg/dL — ABNORMAL HIGH (ref 2.3–4.6)
Potassium: 4.2 mEq/L (ref 3.7–5.3)
SODIUM: 137 meq/L (ref 137–147)

## 2013-05-01 LAB — DIRECT ANTIGLOBULIN TEST (NOT AT ARMC)
DAT, IgG: NEGATIVE
DAT, complement: NEGATIVE

## 2013-05-01 LAB — TECHNOLOGIST SMEAR REVIEW

## 2013-05-01 LAB — TROPONIN I: Troponin I: <0.3 ng/mL (ref <0.30)

## 2013-05-01 LAB — RETICULOCYTES
RBC.: 3.03 MIL/uL — AB (ref 4.22–5.81)
RETIC COUNT ABSOLUTE: 15.2 10*3/uL — AB (ref 19.0–186.0)
RETIC CT PCT: 0.5 % (ref 0.4–3.1)

## 2013-05-01 LAB — GLUCOSE, CAPILLARY
GLUCOSE-CAPILLARY: 103 mg/dL — AB (ref 70–99)
GLUCOSE-CAPILLARY: 108 mg/dL — AB (ref 70–99)
GLUCOSE-CAPILLARY: 110 mg/dL — AB (ref 70–99)
GLUCOSE-CAPILLARY: 79 mg/dL (ref 70–99)
Glucose-Capillary: 100 mg/dL — ABNORMAL HIGH (ref 70–99)
Glucose-Capillary: 100 mg/dL — ABNORMAL HIGH (ref 70–99)
Glucose-Capillary: 96 mg/dL (ref 70–99)

## 2013-05-01 LAB — CBC
HCT: 22.1 % — ABNORMAL LOW (ref 39.0–52.0)
Hemoglobin: 7 g/dL — ABNORMAL LOW (ref 13.0–17.0)
MCH: 23.9 pg — AB (ref 26.0–34.0)
MCHC: 31.7 g/dL (ref 30.0–36.0)
MCV: 75.4 fL — AB (ref 78.0–100.0)
Platelets: 176 10*3/uL (ref 150–400)
RBC: 2.93 MIL/uL — AB (ref 4.22–5.81)
RDW: 20.8 % — ABNORMAL HIGH (ref 11.5–15.5)
WBC: 10.6 10*3/uL — ABNORMAL HIGH (ref 4.0–10.5)

## 2013-05-01 LAB — MAGNESIUM: Magnesium: 2.1 mg/dL (ref 1.5–2.5)

## 2013-05-01 LAB — SAVE SMEAR

## 2013-05-01 MED ORDER — METOPROLOL TARTRATE 1 MG/ML IV SOLN
2.5000 mg | Freq: Once | INTRAVENOUS | Status: AC
  Administered 2013-05-01: 2.5 mg via INTRAVENOUS

## 2013-05-01 MED ORDER — METOPROLOL TARTRATE 1 MG/ML IV SOLN
INTRAVENOUS | Status: AC
  Administered 2013-05-01: 19:00:00
  Filled 2013-05-01: qty 5

## 2013-05-01 MED ORDER — FUROSEMIDE 40 MG PO TABS
40.0000 mg | ORAL_TABLET | Freq: Every day | ORAL | Status: DC
  Administered 2013-05-01 – 2013-05-06 (×6): 40 mg via ORAL
  Filled 2013-05-01 (×6): qty 1

## 2013-05-01 MED ORDER — METOPROLOL TARTRATE 1 MG/ML IV SOLN
INTRAVENOUS | Status: AC
  Filled 2013-05-01: qty 5

## 2013-05-01 NOTE — Progress Notes (Signed)
CSW continuing to follow patient for appropriate dc plans. Per CM, Patient is LTAC candidate and may be able to go to one rather than SNF. CSW standing by.  Jeanette Caprice, MSW, [place]

## 2013-05-01 NOTE — Progress Notes (Signed)
Pt requested breathing treatment at this time; RT paged; will cont. To monitor.

## 2013-05-01 NOTE — Progress Notes (Signed)
MD paged regarding kidney function and order for Vanc; per MD continue to given Vanc for now; will cont. To monitor.

## 2013-05-01 NOTE — Progress Notes (Signed)
Pt given additional 2.5 mg IV Lopressor at this time for sustained HR 140's; MD at bedside; will cont. To monitor.

## 2013-05-01 NOTE — Progress Notes (Addendum)
Subjective: [name] was seen and examined this morning.  He says he feels weak and would like to move around more.  He denies dyspnea, chest or abdominal pain.   Appetite has improved.  Objective: Vital signs in last 24 hours: Filed Vitals:   05/01/13 0456 05/01/13 0848 05/01/13 0942 05/01/13 1023  BP:    158/72  Pulse: 78   91  Temp:      TempSrc:      Resp: 20 18    Height:      Weight:      SpO2: 96%  95%    Weight change:   Intake/Output Summary (Last 24 hours) at 05/01/13 1052 Last data filed at 05/01/13 0900  Gross per 24 hour  Intake   1190 ml  Output    700 ml  Net    490 ml   General: Laying flat in bed in NAD, not as tremulous Cardiac: RRR, no rubs, murmurs or gallops Pulm: wheezing but no crackles appreciated Abd: nondistended, soft, BS present, non-tender Ext: warm and well perfused, no lower extremity edema Neuro: alert and oriented X3  Lab Results: Basic Metabolic Panel:  Recent Labs Lab 04/27/13 0434 04/27/13 0915  04/30/13 0215 05/01/13 0420  NA 139  --   < > 137 137  K 4.0  --   < > 4.3 4.2  CL 90*  --   < > 90* 91*  CO2 25  --   < > 28 25  GLUCOSE 269*  --   < > 194* 110*  BUN 52*  --   < > 64* 65*  CREATININE 2.44*  --   < > 2.88* 2.86*  CALCIUM 6.7*  --   < > 6.5* 6.5*  MG  --  2.0  --   --   --   PHOS 6.3*  --   < > 5.4* 5.5*  < > = values in this interval not displayed. Liver Function Tests:  Recent Labs Lab 04/28/13 2055  04/30/13 0215 05/01/13 0420  PROT 6.7  --   --   --   ALBUMIN  --   < > 1.5* 1.5*  < > = values in this interval not displayed. CBC:  Recent Labs Lab 04/30/13 0215 05/01/13 0420  WBC 9.9 10.6*  HGB 7.7* 7.0*  HCT 24.8* 22.1*  MCV 76.1* 75.4*  PLT 184 176   CBG:  Recent Labs Lab 04/30/13 1615 04/30/13 2111 05/01/13 0017 05/01/13 0314 05/01/13 0438 05/01/13 0558  GLUCAP 113* 126* 96 103* 108* 110*   Medications: I have reviewed the patient's current medications. Scheduled Meds: .  amiodarone  200 mg Oral BID  . antiseptic oral rinse  15 mL Mouth Rinse BID  . aspirin EC  81 mg Oral Daily  . atorvastatin  80 mg Oral q1800  . calcium acetate  2,001 mg Oral TID WC  . calcium carbonate  800 mg of elemental calcium Oral QHS  . collagenase   Topical Daily  . enoxaparin (LOVENOX) injection  30 mg Subcutaneous Q24H  . feeding supplement (GLUCERNA SHAKE)  237 mL Oral BID BM  . hydrALAZINE  25 mg Oral 3 times per day  . insulin aspart  0-9 Units Subcutaneous TID WC  . insulin glargine  17 Units Subcutaneous QHS  . isosorbide dinitrate  40 mg Oral BID  . levothyroxine  150 mcg Oral QAC breakfast  . metoprolol  100 mg Oral BID  . multivitamin with minerals  1 tablet Oral Daily  . mycophenolate  720 mg Oral BID  . pantoprazole  40 mg Oral Daily  . piperacillin-tazobactam (ZOSYN)  IV  3.375 g Intravenous 3 times per day  . sirolimus  1 mg Oral q morning - 10a  . vancomycin  1,000 mg Intravenous Q24H   Continuous Infusions: . sodium chloride 12.5 mL/hr at 05/01/13 0849   PRN Meds:.acetaminophen, albuterol, diphenhydrAMINE, oxyCODONE  Assessment/Plan: 63 year old male with a PMH of HTN, DM type 2, CAD (s/p MI and LAD stent), kidney transplantation, carotid artery stenosis (s/p CEA 08/2011), PVD (s/p fem-pop in 1980), dHF (EF 45-50% Jan 2015), hx of papillary thyroid cancer and prostate cancer who presents with complaint of edema x 3 days and dyspnea for several weeks.   Volume overload:  Was likely 2/2 worsening CHF in the setting of CKD about 13ys s/p renal transplant.  CXR with small bilateral pleural effusions.  Worsening BUN and Cr and decreased urine output despite increased IV Lasix.  Right heart cath on 02/20 revealed normal PCWP and normal CO.  Dry weight appears to be 180 pounds. He is 184 pounds and was net positive 830cc yesterday after holding po Lasix yesterday.  Back on 2L [place].   - resume Lasix 40mg  po - continue Lopressor 100mg  BID, hydralazine 25mg  q8h; isordil  40mg  BID - daily weights and I&Os - PT following and recommend LTACH   B/L pleural effusions:  Antibiotics were started on 02/20 and his dyspnea has improved.  Thoracentesis completed 02/22 and yeilded 780cc of yellow fluid.  Analysis consistent with transudate likely 2/2 to heart failure and hypoalbuminemia.  - oxygen supplementation prn - incentive spirometer  - albuterol nebs q4h  HCAP:  Fevers, leukocytosis and possible LLL infiltrate on CXR in an immunosuppressed patient.  He has been afebrile for the past 24 hours.  Leukocytosis resolved.  Blood cultures received and NGTD. - oxygen supplementation prn - continue Vancomycin and Zosyn per pharmacy - albuterol nebs q4h  SVT:  Asymptomatic - monitor on telemetry - continue metoprolol  - amiodarone 200mg  BID added per cardiology; plan decrease to 200mg  daily in 2 weeks; low dose was started because there is potential for interaction with sirolimus.   - will not adjust Synthroid yet as amio may make him hyper or more hypo.  Will monitor.  Chest pain:  Resolved.  Acute onset of 10/10 chest pain during nebulizer treatment yesterday.  No significant EKG findings. Troponin negative.  Possibly 2/2 to pneumonia.   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 Baseline Cr 1.9-2.5. Creatine 2.24 --> 2.6.  Concern for cardiorenal syndrome given worsening creatinine.  Sirolimus level elevated. - Continue anti-rejection meds  T10 lesion noted on prior MRI:  concerning for mets from Prostate CA.  Followed by Dr. Diona Fanti (Urology).  - T10 bone biopsy and kyphoplasty done on 04/26/13.  Acute on chronic anemia: Hgb 7.0 this AM.  Will hold off on transfusion as patient just got 1 unit PRBCs 2 days ago and want to be careful with volume given HF.  LDH very high 3700 concerning for anemia. - checking Coombs, haptoglobin, retic count, peripheral smear - consider addition of EPO  Hypocalcemia: 6.3 on admission and he  received calcium gluconate.  Repeat on 12/15 was 6.1 w/ albumin 1.6. Corrected Ca was 8.0. However ionized Calcium low at 0.71-->0.79. Replacing Calcium.  DM type 2:  Previously on 24 units Lantus at home, but wife reported  hypoglycemic episodes, so she was administering 12 units over the past few days prior to admission.  BG 96-110 in past 24 hours.  - continue Lantus 17 units qHS  HTN:  Stable.  Continued Lopressor, hydralazine, lasix, isordil.  H/o prostate cancer: Question of spinal mets at last hospitalization, reportedly seen on MRI, but negative bone scan 02/2013. Patient seen by Dr. Diona Fanti with Urology during prior hospitalization.  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  His back pain is being controlled with oxycodone and acetaminophen. - Continue Oxycodone IR 10mg  q 4h PRN and Tylenol  Unstageable pressure ulcer, sacral:  Patient aware of ulcer prior to admission.  Wound care consulted and recommendations appreciated. - continue debridement ointment and moist 2x2 - pressure redistribution pad   DVT ppx: SCDs and lovenox  Dispo: Disposition is deferred at this time, awaiting improvement of current medical problems.  Anticipated discharge in approximately 1-2 day(s).   The patient does have a current PCP (Sueanne Margarita, MD) and may need an [facility] hospital follow-up appointment after discharge.  The patient does not know have transportation limitations that hinder transportation to clinic appointments.  .Services Needed at time of discharge: Y = Yes, Blank = No PT:   OT:   RN:   Equipment:   Other:     LOS: 10 days   Joshua Maxin, DO 05/01/2013, 10:52 AM

## 2013-05-01 NOTE — Progress Notes (Addendum)
Pt given 2.5mg  IV Metoprolol at this time for HR 140's SVT; MD at bedside.

## 2013-05-01 NOTE — Progress Notes (Signed)
Internal Medicine Attending  Date: 05/01/2013  Patient name: Joshua [name] Medical record number: [id number] Date of birth: 1950/11/18 Age: 63 y.o. Gender: male  I saw and evaluated the patient. I reviewed the resident's note by Joshua [name] and I agree with the resident's findings and plans as documented in her progress note.  Joshua [name] continues to feel weak, likely related to the debility associated with this prolonged hospitalization.  Dry weight is about 180 pounds and he is up 4 pounds.  Will reinstitute oral lasix in hopes of maintaining him near his dry weight.  Physical therapy is now working with him and he will require prolonged rehabilitation.  Case management looking into LTAC at D/C.  Given his blood transfusion requiring anemia we will assess for possible hemolysis.  The labs were drawn today.  Finally, we will continue to treat a presumed nosocomial pneumonia for the complete course.

## 2013-05-01 NOTE — Progress Notes (Signed)
Agree with note and plan. We will continue to monitor.

## 2013-05-01 NOTE — Progress Notes (Signed)
Physical Therapy Treatment Patient Details Name: Joshua [name] MRN: [id number] DOB: 20-Aug-1950 Today's Date: 05/01/2013 Time: 6213-0865 PT Time Calculation (min): 35 min  PT Assessment / Plan / Recommendation  History of Present Illness Pt admitted with CHF, ESRD, underwent T 10 kyphoplasty, has sacral wound  PMH of HTN, DM2, CAD with stents, CAD, PVD s/p fem pop, prostate cancer treated with radiation, hx of thyroid cancer, h/o ESRD and kidney transplantation    PT Comments   Pt very anxious which limited his participation (although ultimately did agree to OOB). Also feeling very weak (and noted Hgb 7.0). Pt reports he wants to mobilize more (including walking), however he is very anxious. Discussed having 2 people assist him, including chair to follow and he would like to try that. Will attempt to see 2/24.   Follow Up Recommendations  LTACH     Does the patient have the potential to tolerate intense rehabilitation     Barriers to Discharge        Equipment Recommendations  None recommended by PT    Recommendations for Other Services    Frequency Min 3X/week   Progress towards PT Goals Progress towards PT goals: Not progressing toward goals - comment (low Hgb and incr anxiety; will do better with +2)  Plan Discharge plan needs to be updated    Precautions / Restrictions Precautions Precautions: Fall Restrictions Weight Bearing Restrictions: No   Pertinent Vitals/Pain Lower back; not rated; patient repositioned for comfort;RN provided medication to assist with pain control     Mobility  Bed Mobility Overal bed mobility: Needs Assistance Bed Mobility: Rolling;Sidelying to Sit Rolling: Supervision Sidelying to sit: Min guard;HOB elevated General bed mobility comments: used rail and HOB 20; asking for assist, however was able to perform with cues only; close guarding for safety due to low Hgb Transfers Overall transfer level: Needs assistance Equipment used:  None Transfers: Stand Pivot Transfers Stand pivot transfers: Min guard General transfer comment: steady assist, primarily due to pt's anxiety re: standing and afraid he'll feel dizzy; required max encouragement to get OOB;  Ambulation/Gait General Gait Details: pt refused due to "too weak"    Exercises General Exercises - Lower Extremity Long Arc Quad: AROM;Both;5 reps;Seated Heel Slides: AROM;Both;5 reps;Supine Hip ABduction/ADduction: AROM;Both;5 reps;Supine (hooklying) Heel Raises: AROM;Both;10 reps;Seated Other Exercises Other Exercises: bridging supine x 5 AROM   PT Diagnosis:    PT Problem List:   PT Treatment Interventions:     PT Goals (current goals can now be found in the care plan section)    Visit Information  Last PT Received On: 05/01/13 Assistance Needed: +2 (anxiety; safety) History of Present Illness: Pt admitted with CHF, ESRD, underwent T 10 kyphoplasty, has sacral wound  PMH of HTN, DM2, CAD with stents, CAD, PVD s/p fem pop, prostate cancer treated with radiation, hx of thyroid cancer, h/o ESRD and kidney transplantation     Subjective Data  Subjective: "I can't do this. I need help"   Cognition  Cognition Arousal/Alertness: Awake/alert Behavior During Therapy: Anxious Overall Cognitive Status: Within Functional Limits for tasks assessed General Comments: very anxious re: attempting mobility due to feeling weak (noted Hgb 7.0; also denies OOB over weekend)     Balance  Balance Overall balance assessment: Needs assistance Sitting-balance support: Feet supported;No upper extremity supported Sitting balance-Leahy Scale: Good Sitting balance - Comments: sat EOB and did LE exercises x 5 minutes Standing balance support: Bilateral upper extremity supported;During functional activity Standing balance-Leahy Scale: Poor Standing balance comment:  did not want to hold RW, preferred to hold onto PT's arms  General Comments General comments (skin integrity,  edema, etc.): wife present and very encouraging/supportive. Lots of encouragement provided due to pt's anxiety. By end of session, pt reporting he was very proud of himself  End of Session PT - End of Session Equipment Utilized During Treatment: Gait belt;Oxygen Activity Tolerance: Patient limited by fatigue Patient left: in chair;with call bell/phone within reach;with family/visitor present Nurse Communication: Mobility status;Other (comment);Patient requests pain meds (anxious; instructed to sit up x 1 hour)   GP     Tory Mckissack 05/01/2013, 11:24 AM Pager [phone]

## 2013-05-01 NOTE — Progress Notes (Signed)
HR up to 140's sustained; EKG SVT; MD paged at this time; pt c/o chest discomfort at this time; MD paged.

## 2013-05-01 NOTE — Progress Notes (Signed)
Pt BS 79; pt given apple juice and pt drinking Glucerna; will cont. To monitor.

## 2013-05-01 NOTE — Care Management (Signed)
Appears multi system involvement - LOS 10 days - May be good Ltach candidate.  Can we please have order to investigate if meets criteria.  Luz Lex, [place] [phone]

## 2013-05-01 NOTE — Progress Notes (Signed)
Subjective:  Very weak, PT working with him  Objective:  Vital Signs in the last 24 hours: Temp:  [98.7 F (37.1 C)-100 F (37.8 C)] 99.1 F (37.3 C) (02/23 0404) Pulse Rate:  [78-136] 91 (02/23 1023) Resp:  [17-20] 18 (02/23 0848) BP: (108-158)/(60-74) 158/72 mmHg (02/23 1023) SpO2:  [95 %-98 %] 95 % (02/23 0942)  Intake/Output from previous day:  Intake/Output Summary (Last 24 hours) at 05/01/13 1058 Last data filed at 05/01/13 0900  Gross per 24 hour  Intake   1190 ml  Output    700 ml  Net    490 ml    Physical Exam: General appearance: alert, cooperative, no distress and tremulous, frail Lungs: decreased at bases Heart: RRR with extra systole   Rate: 92  Rhythm: NSR with runs of NS PSVT up to 13 beats  Lab Results:  Recent Labs  04/30/13 0215 05/01/13 0420  WBC 9.9 10.6*  HGB 7.7* 7.0*  PLT 184 176    Recent Labs  04/30/13 0215 05/01/13 0420  NA 137 137  K 4.3 4.2  CL 90* 91*  CO2 28 25  GLUCOSE 194* 110*  BUN 64* 65*  CREATININE 2.88* 2.86*   No results found for this basename: TROPONINI, CK, MB,  in the last 72 hours No results found for this basename: INR,  in the last 72 hours  Imaging: Imaging results have been reviewed  Cardiac Studies:  Assessment/Plan:   Principal Problem:   Acute on chronic combined systolic and diastolic CHF (congestive heart failure) Active Problems:   PAT - Amiodarone added 04/30/13   Anemia   Coronary artery disease- medical Rx, no cath secondary to renal transplant   Protein-calorie malnutrition, severe   Iron deficiency anemia, unspecified   HCAP (healthcare-associated pneumonia)   End stage renal disease- s/p transplant   Controlled diabetes mellitus type 2 with complications   Volume overload   Ischemic cardiomyopathy-EF 30-35% 2/15   Occlusion and stenosis of carotid artery without mention of cerebral infarction   Hypertension   Dyslipidemia, goal LDL below 70   Hypothyroidism   Prostate cancer,  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 Amiodarone added, Lopressor should be decreased in 48 hrs to 50 mg BID. Consider transfusion (Hgb 7.0) with his history of CHF, CAD, and cardiomyopathy. The pt's wife says Dr Radford Pax is his cardiologist. We will arrange follow up when he goes home.   Kerin Ransom PA-C Beeper [phone] 05/01/2013, 10:58 AM

## 2013-05-01 NOTE — Consult Note (Signed)
ANTIBIOTIC CONSULT NOTE - INITIAL  Pharmacy Consult for vancomycin/zosyn Indication: pneumonia (HCAP)  Allergies  Allergen Reactions  . Fentanyl     "Almost died"   . Propofol     "Almost died"     Patient Measurements: Height: 5\' 11"  (180.3 cm) Weight: 184 lb 8.4 oz (83.7 kg) IBW/kg (Calculated) : 75.3  Vital Signs: Temp: 98.3 F (36.8 C) (02/23 1352) Temp src: Oral (02/23 1352) BP: 116/69 mmHg (02/23 1352) Pulse Rate: 77 (02/23 1352) Intake/Output from previous day: 02/22 0701 - 02/23 0700 In: 1430 [P.O.:1280; IV Piggyback:150] Out: 600 [Urine:600]  Labs:  Recent Labs  04/29/13 0525 04/29/13 1540 04/30/13 0215 05/01/13 0420  WBC 8.6 9.6 9.9 10.6*  HGB 7.0* 6.8* 7.7* 7.0*  PLT 170 209 184 176  CREATININE 2.82*  --  2.88* 2.86*   Estimated Creatinine Clearance: 28.9 ml/min (by C-G formula based on Cr of 2.86).  Microbiology:   Medical History: Past Medical History  Diagnosis Date  . Carotid artery occlusion   . ASCVD (arteriosclerotic cardiovascular disease)   . CHF (congestive heart failure)   . Anginal pain     last chest pain was a couple of months ago-just lasted less than a minute  . Stroke     "light stroke" about time of MI  . Renal failure     s/p renal transplant  . Aortic stenosis     moderate AS by 06/2011 echo (Dr. Fransico Him)  . Coronary artery disease 03/2009    2 vessel s/p BMS to LAD in setting of NSTEMI, RCA chronically occluded  . PVD (peripheral vascular disease)     s/p fempop 1980's  . Hypothyroidism     s/p papillary thyroid CA excision  . Hypertension   . Hyperlipidemia   . Diastolic dysfunction   . Type II diabetes mellitus   . Prostate cancer     "radiation tx ~ 4-5 yr ago; cleared then came back a couple months ago" (03/20/2013)  . Plavix resistance     a. P2y12 365 in 03/2013.  . End stage renal disease- s/p transplant 04/03/2013    Patient states he started dialysis sometime around 1981-1982.  He then received a  kidney transplant in 2002 at [facility].  The transplant kidney is still functioning, he sees Dr Florene Glen at [facility] about every 6 months as of 2015.      Medications:  Scheduled:  . amiodarone  200 mg Oral BID  . antiseptic oral rinse  15 mL Mouth Rinse BID  . aspirin EC  81 mg Oral Daily  . atorvastatin  80 mg Oral q1800  . calcium acetate  2,001 mg Oral TID WC  . calcium carbonate  800 mg of elemental calcium Oral QHS  . collagenase   Topical Daily  . enoxaparin (LOVENOX) injection  30 mg Subcutaneous Q24H  . feeding supplement (GLUCERNA SHAKE)  237 mL Oral BID BM  . furosemide  40 mg Oral Daily  . hydrALAZINE  25 mg Oral 3 times per day  . insulin aspart  0-9 Units Subcutaneous TID WC  . insulin glargine  17 Units Subcutaneous QHS  . isosorbide dinitrate  40 mg Oral BID  . levothyroxine  150 mcg Oral QAC breakfast  . metoprolol  100 mg Oral BID  . multivitamin with minerals  1 tablet Oral Daily  . mycophenolate  720 mg Oral BID  . pantoprazole  40 mg Oral Daily  . piperacillin-tazobactam (ZOSYN)  IV  3.375 g Intravenous  3 times per day  . sirolimus  1 mg Oral q morning - 10a  . vancomycin  1,000 mg Intravenous Q24H   Assessment: 63 yo M admitted with SOB and volume overload. PMH is significant for CKD (13 years s/p renal transplant), CAD, PVD, HTN, and prostate cancer. CXR shows possible LLL infiltrate. Due to this, as well as in combination with fever and leukocytosis in an immunosuppressed patient, pharmacy has been consulted to begin vancomycin and zosyn for HCAP treatment.  SCr slowly trending up, 2.86 today. WBC normalized, 10. 6 today, now afebrile  Vancomycin 2/20 >> Zosyn 2/20 >>  2/20 blood x2 >> ngtd  Goal of Therapy:  Vancomycin trough level 15-20 mcg/ml  Plan:  -Continue vancomycin 1000 mg IV q 24 h  -Zosyn 3.375g IV q 8 h -monitor renal function, clinical progression, cultures -Consider discontinuing vancomycin if pt continues to improve -Will consider trough at  steady state if vanc continues  Hughes Better, PharmD, BCPS Clinical Pharmacist Pager: [phone] 05/01/2013 2:30 PM

## 2013-05-02 ENCOUNTER — Other Ambulatory Visit: Payer: Self-pay | Admitting: Cardiology

## 2013-05-02 LAB — GLUCOSE, CAPILLARY
GLUCOSE-CAPILLARY: 173 mg/dL — AB (ref 70–99)
GLUCOSE-CAPILLARY: 194 mg/dL — AB (ref 70–99)
GLUCOSE-CAPILLARY: 259 mg/dL — AB (ref 70–99)
Glucose-Capillary: 212 mg/dL — ABNORMAL HIGH (ref 70–99)

## 2013-05-02 LAB — CBC
HEMATOCRIT: 21.6 % — AB (ref 39.0–52.0)
Hemoglobin: 7 g/dL — ABNORMAL LOW (ref 13.0–17.0)
MCH: 24.8 pg — ABNORMAL LOW (ref 26.0–34.0)
MCHC: 32.4 g/dL (ref 30.0–36.0)
MCV: 76.6 fL — ABNORMAL LOW (ref 78.0–100.0)
Platelets: 154 10*3/uL (ref 150–400)
RBC: 2.82 MIL/uL — ABNORMAL LOW (ref 4.22–5.81)
RDW: 21.1 % — ABNORMAL HIGH (ref 11.5–15.5)
WBC: 9.5 10*3/uL (ref 4.0–10.5)

## 2013-05-02 LAB — RENAL FUNCTION PANEL
Albumin: 1.5 g/dL — ABNORMAL LOW (ref 3.5–5.2)
BUN: 69 mg/dL — ABNORMAL HIGH (ref 6–23)
CALCIUM: 6.3 mg/dL — AB (ref 8.4–10.5)
CO2: 28 mEq/L (ref 19–32)
Chloride: 90 mEq/L — ABNORMAL LOW (ref 96–112)
Creatinine, Ser: 3.15 mg/dL — ABNORMAL HIGH (ref 0.50–1.35)
GFR calc Af Amer: 23 mL/min — ABNORMAL LOW (ref >90)
GFR calc non Af Amer: 20 mL/min — ABNORMAL LOW (ref >90)
GLUCOSE: 193 mg/dL — AB (ref 70–99)
POTASSIUM: 4.2 meq/L (ref 3.7–5.3)
Phosphorus: 5.5 mg/dL — ABNORMAL HIGH (ref 2.3–4.6)
Sodium: 138 mEq/L (ref 137–147)

## 2013-05-02 LAB — TROPONIN I
Troponin I: <0.3 ng/mL (ref <0.30)
Troponin I: <0.3 ng/mL (ref <0.30)

## 2013-05-02 LAB — HAPTOGLOBIN: Haptoglobin: 698 mg/dL — ABNORMAL HIGH (ref 45–215)

## 2013-05-02 MED ORDER — ALBUTEROL SULFATE (2.5 MG/3ML) 0.083% IN NEBU
2.5000 mg | INHALATION_SOLUTION | Freq: Four times a day (QID) | RESPIRATORY_TRACT | Status: DC
  Administered 2013-05-02 – 2013-05-09 (×27): 2.5 mg via RESPIRATORY_TRACT
  Filled 2013-05-02 (×28): qty 3

## 2013-05-02 NOTE — Progress Notes (Signed)
       Patient Name: Joshua [name] Date of Encounter: 05/02/2013    SUBJECTIVE:He has no complaints and difficulty with communication.  TELEMETRY:  Had SVT this AM and LAST PM: Filed Vitals:   05/01/13 2205 05/01/13 2350 05/02/13 0500 05/02/13 1057  BP: 122/65     Pulse: 77   140  Temp:      TempSrc:      Resp:      Height:      Weight:   184 lb 8.4 oz (83.7 kg)   SpO2:  96%      Intake/Output Summary (Last 24 hours) at 05/02/13 1240 Last data filed at 05/02/13 0900  Gross per 24 hour  Intake    850 ml  Output    200 ml  Net    650 ml    LABS: Basic Metabolic Panel:  Recent Labs  05/01/13 0420 05/01/13 1908 05/02/13 0259  NA 137 138 138  K 4.2 4.0 4.2  CL 91* 90* 90*  CO2 25 27 28   GLUCOSE 110* 92 193*  BUN 65* 66* 69*  CREATININE 2.86* 3.02* 3.15*  CALCIUM 6.5* 6.5* 6.3*  MG  --  2.1  --   PHOS 5.5*  --  5.5*   CBC:  Recent Labs  05/01/13 0420 05/02/13 0259  WBC 10.6* 9.5  HGB 7.0* 7.0*  HCT 22.1* 21.6*  MCV 75.4* 76.6*  PLT 176 154   BNP (last 3 results)  Recent Labs  03/20/13 0822 03/27/13 1615 04/21/13 1330  PROBNP 4252.0* 19450.0* 29444.0*    Radiology/Studies:  No new data  Physical Exam: Blood pressure 122/65, pulse 140, temperature 99 F (37.2 C), temperature source Oral, resp. rate 20, height 5\' 11"  (1.803 m), weight 184 lb 8.4 oz (83.7 kg), SpO2 96.00%. Weight change:    Chest clear Cardiac exam with no rub or gallop. Uncooperative or unable to follow commands  ASSESSMENT:  1. Still having SVT on multiple drugs. This will get better as Amio loads 2. Chronic SHF, stable  Plan:  1. Continue amiodarone and IV beta blocker as needed. 2. Continue to monitor CHF.  Demetrios Isaacs 05/02/2013, 12:40 PM

## 2013-05-02 NOTE — Consult Note (Signed)
WOC wound consult note Reason for Consult: Pressure ulcer to sacrum, present on admission. Wound type: Pressure ulcer Pressure Ulcer POA: Yes Measurement: 3.6 cm x 2.4 cm x 1 cm Wound bed: 100% covered in very thin layer of loosely adherent slough. Drainage (amount, consistency, odor) minimal, thick, creamy drainage.  No odor.  Periwound: Intact Dressing procedure/placement/frequency: Cleanse sacral pressure ulcer with NS and pat gently dry. APply Santyl ointment to wound bed, 1/8 inch thickness (opaque).  Top with moist NS dressing, pack into wound.  Secure with dry 4x4 and tape.  Change twice daily.  Will not follow at this time.  Please re-consult if needed.  Domenic Moras RN BSN [place] Pager [phone]

## 2013-05-02 NOTE — Progress Notes (Signed)
CSW has re-sent updated clinicals to SNF facilities as a backup to LTAC.  Jeanette Caprice, MSW, [place]

## 2013-05-02 NOTE — Progress Notes (Signed)
Physical Therapy Treatment Patient Details Name: Gotti Alwin MRN: [id number] DOB: 1951/01/19 Today's Date: 05/02/2013 Time: 3267-1245 PT Time Calculation (min): 26 min  PT Assessment / Plan / Recommendation  History of Present Illness Pt admitted with CHF, ESRD, underwent T 10 kyphoplasty, has sacral wound  PMH of HTN, DM2, CAD with stents, CAD, PVD s/p fem pop, prostate cancer treated with radiation, hx of thyroid cancer, h/o ESRD and kidney transplantation    PT Comments   Pt able to ambulate today and progressing with mobility although fearful and benefits from encouragement and reassurance. Pt on bedpan on arrival with assist for pericare with sacral dressing soiled and replaced. Noted wound and discussed with RN the potential benefit of hydrotherapy. Will continue to follow and feel pt could ambulate in hall next session.   Follow Up Recommendations  LTACH     Does the patient have the potential to tolerate intense rehabilitation     Barriers to Discharge        Equipment Recommendations       Recommendations for Other Services Other (comment) (hydrotherapy for sacral wound)  Frequency     Progress towards PT Goals Progress towards PT goals: Progressing toward goals  Plan Current plan remains appropriate    Precautions / Restrictions Precautions Precautions: Fall Precaution Comments: sacral wound, bed alarm   Pertinent Vitals/Pain No pain VSS    Mobility  Bed Mobility Rolling: Supervision Sidelying to sit: Min assist General bed mobility comments: cues for sequence with rail to roll and min assist with encouragement and cues to sequence to elevate trunk side to sit with bed flat Transfers Equipment used: Rolling walker (2 wheeled) Sit to Stand: Min guard Stand pivot transfers: Min guard General transfer comment: pt stood from bed then recliner x 2 with cues for hand placement and safety with encoruagement due to pt anxiety.  Ambulation/Gait Ambulation/Gait  assistance: Min guard Ambulation Distance (Feet): 25 Feet (15', 25' with seated rest) Assistive device: Rolling walker (2 wheeled) Gait Pattern/deviations: Step-through pattern;Decreased stride length;Trunk flexed Gait velocity interpretation: Below normal speed for age/gender    Exercises     PT Diagnosis:    PT Problem List:   PT Treatment Interventions:     PT Goals (current goals can now be found in the care plan section)    Visit Information  Last PT Received On: 05/02/13 Assistance Needed: +1 History of Present Illness: Pt admitted with CHF, ESRD, underwent T 10 kyphoplasty, has sacral wound  PMH of HTN, DM2, CAD with stents, CAD, PVD s/p fem pop, prostate cancer treated with radiation, hx of thyroid cancer, h/o ESRD and kidney transplantation     Subjective Data      Cognition  Cognition Arousal/Alertness: Awake/alert Behavior During Therapy: Anxious Overall Cognitive Status: Within Functional Limits for tasks assessed    Balance     End of Session PT - End of Session Equipment Utilized During Treatment: Gait belt;Oxygen Activity Tolerance: Patient limited by fatigue Patient left: in chair;with call bell/phone within reach Nurse Communication: Mobility status;Precautions (sacral wound that would benefit from hydotherapy)   GP     Lanetta Inch Beth 05/02/2013, 10:40 AM Elwyn Reach, [place]

## 2013-05-02 NOTE — Progress Notes (Signed)
CRITICAL VALUE ALERT  Critical value received:  Calcium    Date of notification:  05/02/13  Time of notification:  0357  Critical value read back: yes   Nurse who received alert:  Quentin Angst   MD notified (1st page):  Rhabbani  Time of first page:  0359  Responding MD:  Rhabbani, no new orders received.  Time MD responded:  9357

## 2013-05-02 NOTE — Progress Notes (Signed)
Internal Medicine Attending  Date: 05/02/2013  Patient name: Joshua [name] Medical record number: [id number] Date of birth: Jul 27, 1950 Age: 63 y.o. Gender: male  I saw and evaluated the patient. I reviewed the resident's note by Dr. Redmond Pulling and I agree with the resident's findings and plans as documented in her progress note.  [name] was working with PT this AM when seen on rounds.  He was only to ambulate to the hallway from his bed with a walker and assistance but then remained up in a chair thereafter.  He was encouraged to continue to work with PT as this was crucial to his recovery.  Had another run of SVT but is being loaded with amio for this.  Creatinine is slightly elevated today and positive 700 cc without weight change.  Will continue lasix at the current dose orally and continue to monitor his weights to make sure he does not become volume overloaded again.  His anemia work-up to date reveals a very low retic count suggesting some marrow suppression from critical illness or worsening renal function.  Finally, we are completing a 7 day course of antibiotics for his health care associated pneumonia and he has 2 more days to go.

## 2013-05-02 NOTE — Progress Notes (Addendum)
Subjective: Joshua [name] experienced an episode of sustained SVT with chest discomfort yesterday which responded to 5mg  total of IV Lopressor.  EKG NSR and troponins were negative x 3.  He was seen and examined this morning.  He was seen ambulating with PT and appears improved from yesterday.  He says he has eaten well yesterday and today.  No chest pain since yesterday.   Objective: Vital signs in last 24 hours: Filed Vitals:   05/01/13 2205 05/01/13 2350 05/02/13 0500 05/02/13 1057  BP: 122/65     Pulse: 77   140  Temp:      TempSrc:      Resp:      Height:      Weight:   184 lb 8.4 oz (83.7 kg)   SpO2:  96%     Weight change:   Intake/Output Summary (Last 24 hours) at 05/02/13 1140 Last data filed at 05/02/13 0900  Gross per 24 hour  Intake   1210 ml  Output    200 ml  Net   1010 ml   General: Sitting up in chair in NAD Cardiac: RRR, no rubs, murmurs or gallops Pulm: wheezing but no crackles appreciated Abd: nondistended, soft, BS present, non-tender Ext: warm and well perfused, no lower extremity edema Neuro: alert and oriented X3, mentating appropriately  Lab Results: Basic Metabolic Panel:  Recent Labs Lab 04/27/13 0915  05/01/13 0420 05/01/13 1908 05/02/13 0259  NA  --   < > 137 138 138  K  --   < > 4.2 4.0 4.2  CL  --   < > 91* 90* 90*  CO2  --   < > 25 27 28   GLUCOSE  --   < > 110* 92 193*  BUN  --   < > 65* 66* 69*  CREATININE  --   < > 2.86* 3.02* 3.15*  CALCIUM  --   < > 6.5* 6.5* 6.3*  MG 2.0  --   --  2.1  --   PHOS  --   < > 5.5*  --  5.5*  < > = values in this interval not displayed. Liver Function Tests:  Recent Labs Lab 04/28/13 2055  05/01/13 0420 05/02/13 0259  PROT 6.7  --   --   --   ALBUMIN  --   < > 1.5* 1.5*  < > = values in this interval not displayed. CBC:  Recent Labs Lab 05/01/13 0420 05/02/13 0259  WBC 10.6* 9.5  HGB 7.0* 7.0*  HCT 22.1* 21.6*  MCV 75.4* 76.6*  PLT 176 154   CBG:  Recent Labs Lab  05/01/13 0558 05/01/13 1145 05/01/13 1616 05/01/13 2035 05/02/13 0608 05/02/13 1121  GLUCAP 110* 79 100* 100* 212* 173*   Medications: I have reviewed the patient's current medications. Scheduled Meds: . albuterol  2.5 mg Nebulization Q6H  . amiodarone  200 mg Oral BID  . antiseptic oral rinse  15 mL Mouth Rinse BID  . aspirin EC  81 mg Oral Daily  . atorvastatin  80 mg Oral q1800  . calcium acetate  2,001 mg Oral TID WC  . calcium carbonate  800 mg of elemental calcium Oral QHS  . collagenase   Topical Daily  . enoxaparin (LOVENOX) injection  30 mg Subcutaneous Q24H  . feeding supplement (GLUCERNA SHAKE)  237 mL Oral BID BM  . furosemide  40 mg Oral Daily  . hydrALAZINE  25 mg Oral 3 times per day  .  insulin aspart  0-9 Units Subcutaneous TID WC  . insulin glargine  17 Units Subcutaneous QHS  . isosorbide dinitrate  40 mg Oral BID  . levothyroxine  150 mcg Oral QAC breakfast  . metoprolol  100 mg Oral BID  . multivitamin with minerals  1 tablet Oral Daily  . mycophenolate  720 mg Oral BID  . pantoprazole  40 mg Oral Daily  . piperacillin-tazobactam (ZOSYN)  IV  3.375 g Intravenous 3 times per day  . sirolimus  1 mg Oral q morning - 10a  . vancomycin  1,000 mg Intravenous Q24H   Continuous Infusions: . sodium chloride 12.5 mL/hr at 05/01/13 0849   PRN Meds:.acetaminophen, diphenhydrAMINE, oxyCODONE  Assessment/Plan: 63 year old male with a PMH of HTN, DM type 2, CAD (s/p MI and LAD stent), kidney transplantation, carotid artery stenosis (s/p CEA 08/2011), PVD (s/p fem-pop in 1980), dHF (EF 45-50% Jan 2015), hx of papillary thyroid cancer and prostate cancer who presents with complaint of edema x 3 days and dyspnea for several weeks.   Volume overload:  Was likely 2/2 worsening CHF in the setting of CKD about 13ys s/p renal transplant.  CXR with small bilateral pleural effusions.  Worsening BUN and Cr and decreased urine output despite increased IV Lasix.  Right heart cath  on 02/20 revealed normal PCWP and normal CO.  Dry weight appears to be 180 pounds. He is 184 pounds which is unchanged from yesterday.  Net positive 730cc yesterday.  Will hold off on increasing Lasix given his worsening creatinine.   - continue Lasix 40mg  po - continue Lopressor 100mg  BID, hydralazine 25mg  q8h; isordil 40mg  BID - daily weights and I&Os - PT following and recommend LTACH   B/L pleural effusions:  Antibiotics were started on 02/20 and his dyspnea has improved.  Thoracentesis completed 02/22 and yeilded 780cc of yellow fluid.  Analysis consistent with transudate likely 2/2 to heart failure and hypoalbuminemia.  - oxygen supplementation prn - incentive spirometer  - albuterol nebs prn --> q6h  HCAP:  Fevers, leukocytosis and possible LLL infiltrate on CXR in an immunosuppressed patient.  He has been afebrile for the past 24 hours.  Leukocytosis resolved.  Blood cultures pending, NGTD. - oxygen supplementation prn - continue Vancomycin and Zosyn per pharmacy - albuterol nebs prn ---> q6h   SVT:  Experienced sustained SVT to the 140s yesterday with chest discomfort. EKG NSR. - monitor on telemetry - continue metoprolol  - continue amiodarone 200mg  BID; plan decrease to 200mg  daily in 2 weeks; low dose was started because there is potential for interaction with sirolimus.   - IV lopressor prn  - will not adjust Synthroid yet as amio may make him hyper or more hypo.    Chest pain:  Resolved.  Likely 2/2 to SVT.  No EKG changes and troponin negative x 3.   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 Baseline Cr 1.9-2.5. Creatine 2.24 --> 3.15.  Had been stable around 2.8 but increased above 3 in past two days.  Possibly 2/2 to vancomycin.  Will monitor BMP.   - Continue anti-rejection meds  Acute on chronic anemia: Hgb 7.0 this AM.  Will hold off on transfusion as patient just got 1 unit PRBCs 3 days ago and want to be careful with volume given HF.   LDH very high 3700 concerning for anemia.  Haptoglobin elevated at 698 so hemolytic anemia unlikely.  Likely anemia of chronic disease. Will  continue to monitor.      - consider addition of EPO  Hypocalcemia: 6.3 on admission and he received calcium gluconate.  Repeat on 12/15 was 6.1 w/ albumin 1.6. Corrected Ca was 8.0. However ionized Calcium low at 0.71-->0.79. Replacing Calcium.  DM type 2:  Previously on 24 units Lantus at home, but wife reported hypoglycemic episodes, so she was administering 12 units over the past few days prior to admission.  BG 100-173 in past 24 hours.  - continue Lantus 17 units qHS  HTN:  Stable.  Continued Lopressor, hydralazine, lasix, isordil.  H/o prostate cancer with T10 lesion noted on prior MRI: Question of spinal mets at last hospitalization, reportedly seen on MRI, but negative bone scan 02/2013. Patient seen by Dr. Diona Fanti with Urology during prior hospitalization.  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  His back pain is being controlled with oxycodone and acetaminophen. - Continue Oxycodone IR 10mg  q 4h PRN and Tylenol  Unstageable pressure ulcer, sacral:  Patient aware of ulcer prior to admission.  Wound care consulted and recommendations appreciated. - continue debridement ointment and moist 2x2 - pressure redistribution pad   DVT ppx: SCDs and lovenox  Dispo: Disposition is deferred at this time, awaiting improvement of current medical problems.  Anticipated discharge in approximately 1-2 day(s).   The patient does have a current PCP (Sueanne Margarita, MD) and may need an [facility] hospital follow-up appointment after discharge.  The patient does not know have transportation limitations that hinder transportation to clinic appointments.  .Services Needed at time of discharge: Y = Yes, Blank = No PT:   OT:   RN:   Equipment:   Other:     LOS: 11 days   Joshua Maxin,  DO 05/02/2013, 11:40 AM

## 2013-05-03 DIAGNOSIS — M549 Dorsalgia, unspecified: Secondary | ICD-10-CM

## 2013-05-03 LAB — BODY FLUID CULTURE: CULTURE: NO GROWTH

## 2013-05-03 LAB — RENAL FUNCTION PANEL
Albumin: 1.5 g/dL — ABNORMAL LOW (ref 3.5–5.2)
BUN: 70 mg/dL — ABNORMAL HIGH (ref 6–23)
CALCIUM: 6.6 mg/dL — AB (ref 8.4–10.5)
CHLORIDE: 86 meq/L — AB (ref 96–112)
CO2: 23 mEq/L (ref 19–32)
Creatinine, Ser: 3.13 mg/dL — ABNORMAL HIGH (ref 0.50–1.35)
GFR, EST AFRICAN AMERICAN: 23 mL/min — AB (ref >90)
GFR, EST NON AFRICAN AMERICAN: 20 mL/min — AB (ref >90)
GLUCOSE: 228 mg/dL — AB (ref 70–99)
POTASSIUM: 5.1 meq/L (ref 3.7–5.3)
Phosphorus: 5.9 mg/dL — ABNORMAL HIGH (ref 2.3–4.6)
Sodium: 135 mEq/L — ABNORMAL LOW (ref 137–147)

## 2013-05-03 LAB — CBC WITH DIFFERENTIAL/PLATELET
BASOS PCT: 0 % (ref 0–1)
Basophils Absolute: 0 10*3/uL (ref 0.0–0.1)
Eosinophils Absolute: 0.1 10*3/uL (ref 0.0–0.7)
Eosinophils Relative: 1 % (ref 0–5)
HEMATOCRIT: 23.7 % — AB (ref 39.0–52.0)
HEMOGLOBIN: 7.4 g/dL — AB (ref 13.0–17.0)
LYMPHS PCT: 11 % — AB (ref 12–46)
Lymphs Abs: 1.1 10*3/uL (ref 0.7–4.0)
MCH: 23.9 pg — ABNORMAL LOW (ref 26.0–34.0)
MCHC: 31.2 g/dL (ref 30.0–36.0)
MCV: 76.5 fL — ABNORMAL LOW (ref 78.0–100.0)
MONOS PCT: 9 % (ref 3–12)
Monocytes Absolute: 0.9 10*3/uL (ref 0.1–1.0)
NEUTROS PCT: 79 % — AB (ref 43–77)
Neutro Abs: 8.2 10*3/uL — ABNORMAL HIGH (ref 1.7–7.7)
Platelets: 172 10*3/uL (ref 150–400)
RBC: 3.1 MIL/uL — AB (ref 4.22–5.81)
RDW: 21.8 % — ABNORMAL HIGH (ref 11.5–15.5)
Smear Review: ADEQUATE
WBC: 10.3 10*3/uL (ref 4.0–10.5)

## 2013-05-03 LAB — GLUCOSE, CAPILLARY
GLUCOSE-CAPILLARY: 157 mg/dL — AB (ref 70–99)
Glucose-Capillary: 205 mg/dL — ABNORMAL HIGH (ref 70–99)
Glucose-Capillary: 234 mg/dL — ABNORMAL HIGH (ref 70–99)

## 2013-05-03 MED ORDER — METOPROLOL TARTRATE 1 MG/ML IV SOLN
INTRAVENOUS | Status: AC
  Administered 2013-05-03: 2.5 mg
  Filled 2013-05-03: qty 5

## 2013-05-03 MED ORDER — INSULIN GLARGINE 100 UNIT/ML ~~LOC~~ SOLN
20.0000 [IU] | Freq: Every day | SUBCUTANEOUS | Status: DC
  Administered 2013-05-03 – 2013-05-08 (×6): 20 [IU] via SUBCUTANEOUS
  Filled 2013-05-03 (×7): qty 0.2

## 2013-05-03 NOTE — Progress Notes (Signed)
Patient: Joshua [name] / Admit Date: 04/21/2013 / Date of Encounter: 05/03/2013, 8:20 AM   Subjective  Can feel when heart is beating rapidly. Had episode of CP yesterday but none this AM. Totally oriented at present. No SOB. Back pain doing OK today.  Objective   Telemetry: brief SVT this AM 7:45-8am, recurrence just a short while ago back in SVT. Otherwise has had 7-9 beats NSVT. NSR when not in SVT  Physical Exam: Blood pressure 134/63, pulse 80, temperature 98.2 F (36.8 C), temperature source Oral, resp. rate 18, height 5\' 11"  (1.803 m), weight 184 lb 8.4 oz (83.7 kg), SpO2 95.00%. General: Well developed chronically ill appearing AAM Head: Normocephalic, atraumatic, sclera non-icteric, no xanthomas, nares are without discharge. Arcus senilis present. Neck:  JVP not elevated. Lungs: Clear bilaterally to auscultation without wheezes, rales, or rhonchi. Breathing is unlabored. Heart: RRR S1 S2 without murmurs, rubs, or gallops.  Abdomen: Soft, non-tender, non-distended with normoactive bowel sounds. No rebound/guarding. Extremities: No clubbing or cyanosis. No edema. Distal pedal pulses are 2+ and equal bilaterally. Neuro: Alert and oriented X 3. Moves all extremities spontaneously. Brief spontaneous jerky movements of extremities Psych:  Responds to questions appropriately with a normal affect.   Intake/Output Summary (Last 24 hours) at 05/03/13 0820 Last data filed at 05/02/13 1300  Gross per 24 hour  Intake    360 ml  Output      0 ml  Net    360 ml    Inpatient Medications:  . albuterol  2.5 mg Nebulization Q6H  . amiodarone  200 mg Oral BID  . antiseptic oral rinse  15 mL Mouth Rinse BID  . aspirin EC  81 mg Oral Daily  . atorvastatin  80 mg Oral q1800  . calcium acetate  2,001 mg Oral TID WC  . calcium carbonate  800 mg of elemental calcium Oral QHS  . collagenase   Topical Daily  . enoxaparin (LOVENOX) injection  30 mg Subcutaneous Q24H  . feeding supplement  (GLUCERNA SHAKE)  237 mL Oral BID BM  . furosemide  40 mg Oral Daily  . hydrALAZINE  25 mg Oral 3 times per day  . insulin aspart  0-9 Units Subcutaneous TID WC  . insulin glargine  17 Units Subcutaneous QHS  . isosorbide dinitrate  40 mg Oral BID  . levothyroxine  150 mcg Oral QAC breakfast  . metoprolol  100 mg Oral BID  . multivitamin with minerals  1 tablet Oral Daily  . mycophenolate  720 mg Oral BID  . pantoprazole  40 mg Oral Daily  . piperacillin-tazobactam (ZOSYN)  IV  3.375 g Intravenous 3 times per day  . sirolimus  1 mg Oral q morning - 10a  . vancomycin  1,000 mg Intravenous Q24H   Infusions:  . sodium chloride 12.5 mL/hr at 05/01/13 0849    Labs:  Recent Labs  05/01/13 1908 05/02/13 0259 05/03/13 0315  NA 138 138 135*  K 4.0 4.2 5.1  CL 90* 90* 86*  CO2 27 28 23   GLUCOSE 92 193* 228*  BUN 66* 69* 70*  CREATININE 3.02* 3.15* 3.13*  CALCIUM 6.5* 6.3* 6.6*  MG 2.1  --   --   PHOS  --  5.5* 5.9*    Recent Labs  05/02/13 0259 05/03/13 0315  ALBUMIN 1.5* 1.5*    Recent Labs  05/02/13 0259 05/03/13 0315  WBC 9.5 10.3  NEUTROABS  --  8.2*  HGB 7.0* 7.4*  HCT 21.6* 23.7*  MCV 76.6* 76.5*  PLT 154 172    Recent Labs  05/01/13 2101 05/02/13 0259 05/02/13 0902  TROPONINI <0.30 <0.30 <0.30   No components found with this basename: POCBNP,  No results found for this basename: HGBA1C,  in the last 72 hours   Radiology/Studies:  Dg Chest 1 View  04/29/2013   CLINICAL DATA:  Thoracentesis  EXAM: CHEST - 1 VIEW  COMPARISON:  DG CHEST 2 VIEW dated 04/27/2013  FINDINGS: The heart remains moderately enlarged. Left pleural effusion resolve after the thoracentesis. No ensuing pneumothorax. Overall, vascular congestion and edema have also improved. Right pleural effusion has probably also improved.  IMPRESSION: No pneumothorax post left thoracentesis.   Electronically Signed   By: Maryclare Bean M.D.   On: 04/29/2013 11:27   Dg Chest 2 View  04/27/2013    CLINICAL DATA:  Shortness of breath, dyspnea, weakness  EXAM: CHEST  2 VIEW  COMPARISON:  DG CHEST 1V PORT dated 04/27/2013; DG CHEST 2 VIEW dated 04/24/2013; DG CHEST 2 VIEW dated 04/22/2013  FINDINGS: Grossly unchanged enlarged cardiac silhouette and mediastinal contours. The pulmonary vasculature is less distinct on the present examination with cephalization of flow. Worsening perihilar and bilateral medial basilar heterogeneous/consolidative opacities, left greater than right. Interval development of small bilateral pleural effusions. No pneumothorax. Grossly unchanged bones. Multiple surgical clips overlie the midline of the lower neck. A vascular stent overlies the inner aspect of the left upper arm. Surgical clips overlie the bilateral axilla.  IMPRESSION: Findings most suggestive of worsening pulmonary edema, small bilateral effusions and perihilar/medial basilar opacities, left greater than right, atelectasis versus infiltrate.   Electronically Signed   By: Sandi Mariscal M.D.   On: 04/27/2013 11:29   Dg Chest 2 View  04/24/2013   CLINICAL DATA:  Dyspnea  EXAM: CHEST  2 VIEW  COMPARISON:  Prior radiograph from 04/22/2013  FINDINGS: Cardiomegaly is stable as compared to the prior exam.  Lungs are mildly hypoinflated. There is diffuse pulmonary vascular congestion with indistinctness of the interstitial markings, compatible with pulmonary edema, worsened. Small bilateral pleural effusions are present. More confluent bilateral perihilar opacities likely reflect alveolar edema. No definite focal infiltrates identified. No pneumothorax.  Surgical clips overlie the left axilla and lower neck. No acute osseous abnormality.  IMPRESSION: Cardiomegaly with small bilateral pleural effusions and moderate diffuse pulmonary edema, worsened as compared to 04/22/13.   Electronically Signed   By: Jeannine Boga M.D.   On: 04/24/2013 03:12   Dg Chest 2 View  04/22/2013   CLINICAL DATA:  Shortness of breath and weakness   EXAM: CHEST  2 VIEW  COMPARISON:  04/21/2013  FINDINGS: Clips project over the neck from presumed thyroidectomy. Interstitial edema, trace effusions, and curvilinear bilateral lower lobe presumed atelectasis are again noted. There has been interval increase in hazy predominantly dependent bilateral lower lobe airspace opacity. Cardiomegaly persists. Left axillary clips are noted.  IMPRESSION: Slight increase in bibasilar opacities likely progressive alveolar superimposed on interstitial pulmonary edema.   Electronically Signed   By: Conchita Paris M.D.   On: 04/22/2013 16:32   Dg Chest 2 View  04/21/2013   CLINICAL DATA:  Leg swelling, history CHF, hypertension, diabetes, stroke  EXAM: CHEST  2 VIEW  COMPARISON:  04/19/2013  FINDINGS: Enlargement of cardiac silhouette with pulmonary vascular congestion.  Diffuse pulmonary infiltrates compatible pulmonary edema and CHF.  Scattered areas of bibasilar atelectasis.  More focal opacity or left lung base may represent atelectasis or increased edema but coexistent  infection not excluded.  Small bibasilar effusions.  No pneumothorax.  Vascular stents in left upper arm.  Retained contrast in colon.  IMPRESSION: Diffuse pulmonary infiltrates likely representing pulmonary edema/ CHF with scattered areas of atelectasis and small bilateral pleural effusions as above.   Electronically Signed   By: Lavonia Dana M.D.   On: 04/21/2013 14:40   Dg Chest 2 View  04/19/2013   CLINICAL DATA:  Atrophic relation and tachycardia  FINDINGS: There is pulmonary edema. There are bilateral pleural effusions. There is consolidation of bilateral lung bases. The heart size is enlarged. The aorta is tortuous. Surgical clips are identified in the lower neck and upper mediastinum an bilateral axilla.  IMPRESSION: Congestive heart failure. Small bilateral pleural effusions. Patchy consolidation of both lung bases, superimposed pneumonia not excluded.   Electronically Signed   By: Abelardo Diesel  M.D.   On: 04/19/2013 01:57   Dg Chest Port 1 View  04/27/2013   CLINICAL DATA:  Increasing shortness of breath question fluid overload  EXAM: PORTABLE CHEST - 1 VIEW  COMPARISON:  Portable exam [id number] hr compared to 04/24/2013  FINDINGS: Enlargement of cardiac silhouette with pulmonary vascular congestion.  Diffuse interstitial infiltrates consistent with pulmonary edema.  Additional atelectasis versus consolidation left lower lobe.  No pleural effusion or pneumothorax.  IMPRESSION: CHF with atelectasis versus consolidation in left lower lobe.   Electronically Signed   By: Lavonia Dana M.D.   On: 04/27/2013 09:14   Dg Colon Alonza Bogus Hi Den Cm  04/04/2013   CLINICAL DATA:  Incomplete colonoscopy.  Rectal bleeding.  Anemia.  EXAM: AIR CONTRAST BARIUM ENEMA  TECHNIQUE: Initial scout AP supine abdominal image obtained to insure adequate colon cleansing. Barium was introduced into the colon in a retrograde fashion and refluxed from the rectum to the distal transverse colon. As much of the barium as possible was then removed through the indwelling tube via gravity drain. Air was then insufflated into the colon. Spot images of the colon followed by overhead radiographs were obtained.  COMPARISON:  No priors.  FLUOROSCOPY TIME:  6 min and 58 seconds  FINDINGS: Initial scout view of the abdomen demonstrates diffuse gaseous distention throughout the colon. Several nondilated loops of gas-filled small bowel are noted. Multiple surgical clips are noted.  Filling of the entire colon with barium was challenging in this patient secondary to extreme colonic redundancy. However, barium eventually reached the cecum. Contrast was then drained, and air insufflated. Double contrast images of the colon demonstrated extreme colonic redundancy. No definite colonic mass identified. On some images, small filling defects were noted within the descending colon and distal transverse colon, however, these were not confirmed on other views,  favored to be transient related to residual fecal contents. No definite polyps were confidently identified.  IMPRESSION: 1. No definite colonic mass or other source for rectal bleeding confidently identified on today's limited examination. 2. Marked colonic redundancy noted.   Electronically Signed   By: Vinnie Langton M.D.   On: 04/04/2013 18:08   Dg Fluoro Guide Cv Line-no Report  04/28/2013   CLINICAL DATA: Gordy Councilman placement   FLOURO GUIDE CV LINE  Fluoroscopy was utilized by the requesting physician.  No radiographic  interpretation.    Ir Kypho Vertebral Thoracic Augmentation  04/26/2013   CLINICAL DATA:  Prostate carcinoma. Multiple axial lesions on thoracic and lumbar MRI. Painful compression fracture deformity of T12.  EXAM: 1.  DEEP BONE BIOPSY THORACIC T12 UNDER FLUOROSCOPY  2.  KYPHOPLASTY AT  THORACIC T12  TECHNIQUE: The procedure, risks (including but not limited to bleeding, infection, organ damage), benefits, and alternatives were explained to the patient. Questions regarding the procedure were encouraged and answered. The patient understands and consents to the procedure.  The patient was placed prone on the fluoroscopic table. The skin overlying the lower thoracic region was then prepped and draped in the usual sterile fashion. Maximal barrier sterile technique was utilized including caps, mask, sterile gowns, sterile gloves, sterile drape, hand hygiene and skin antiseptic.  Intravenous Fentanyl and Versed were administered as conscious sedation during continuous cardiorespiratory monitoring by the radiology RN, with a total moderate sedation time of40 minutes.  As antibiotic prophylaxis, !cefazolin 1 gram! was ordered pre-procedure and administered intravenously within !one hour! of incision.  The thoracic compression deformity of T12 was localized.The right pedicle at T12 was then infiltrated with 1% lidocaine followed by the advancement of a Kyphon trocar needle through the right pedicle  into the posterior one-third. The trocar was removed and a coaxial core bone biopsy was obtained. Subsequently, The osteo drill was advanced to the anterior third of the vertebral body. The osteo drill was retracted. Through the working cannula, a Kyphon inflatable bone tamp 15 x 3 was advanced and positioned with the distal marker 5 mm from the anterior aspect of the cortex. Crossing of the midline was not seen on the AP projection. At this time, the balloon was expanded using contrast via a Kyphon inflation syringe device via micro tubing.  In similar fashion, the left pedicle was infiltrated with 1% lidocaine followed by the advancement of a second Kyphon trocar needle through the left pedicle into the posterior third of the vertebral body. Coaxial core deep bone biopsy sample obtained. The osteo drill was coaxially advanced to the anterior right third. The osteo drill was exchanged for a Kyphon inflatable bone tamp 15x 3, advanced to the 5 mm of the anterior aspect of the cortex. The balloon was then expanded using contrast as above.  Inflations were continued until there was near apposition across the midline and approaching the superior and the inferior end plates.  At this time, methylmethacrylate mixture was reconstituted in the Kyphon bone mixing device system. This was then loaded into the delivery mechanism, attached to Kyphon bone fillers.  The balloons were deflated and removed followed by the instillation of methylmethacrylate mixture with excellent filling in the AP and lateral projections. No extravasation was noted in the disk spaces or posteriorly into the spinal canal. No epidural venous contamination was seen.  The patient tolerated the procedure well. There were no acute complications. The working cannulae and the bone filler were then retrieved and removed.  IMPRESSION: 1. Deep bone core biopsy of thoracic T12 lesion. 2. Status post vertebral body augmentation using balloon kyphoplasty at  thoracic T12 as described without event.   Electronically Signed   By: Arne Cleveland M.D.   On: 04/26/2013 18:14   US Thoracentesis Asp Pleural Space W/img Guide  04/29/2013   CLINICAL DATA:  Patient with history of dyspnea, CHF, bilateral pleural effusions, coronary artery disease, chronic kidney disease, prostate cancer ; request is made for left thoracentesis.  EXAM: ULTRASOUND GUIDED DIAGNOSTIC AND THERAPEUTIC LEFT THORACENTESIS  COMPARISON:  None.  FINDINGS: A total of approximately 780 cc's of yellow fluid was removed. The fluid sample was sent for laboratory analysis.  IMPRESSION: Successful ultrasound guided diagnostic/therapeutic left thoracentesis yielding 780 cc's of pleural fluid.  Read by: Rowe Robert ,P.A.-C.  PROCEDURE: An  ultrasound guided thoracentesis was thoroughly discussed with the patient and questions answered. The benefits, risks, alternatives and complications were also discussed. The patient understands and wishes to proceed with the procedure. Written consent was obtained.  Ultrasound was performed to localize and mark an adequate pocket of fluid in the left chest. The area was then prepped and draped in the normal sterile fashion. 1% Lidocaine was used for local anesthesia. Under ultrasound guidance a 19 gauge Yueh catheter was introduced. Thoracentesis was performed. The catheter was removed and a dressing applied.  Complications:  none   Electronically Signed   By: Maryclare Bean M.D.   On: 04/29/2013 11:11     Assessment and Plan  Mr. Tristan is a 63 year old male with recently complex PMH. He has h/o CAD s/p prior stenting, remote renal transplant on immunosuppressants, dCHF, mild AS, DM, HTN, possible metastatic prostate CA. He was admitted for prolonged stay 03/2013 with CP and abnormal nuc treated with medical therapy. VQ was neg. EF 40-45% by echo during that admission, 34% by nuc. During that admission he had interval development of paroxysmal atrial tach but developed  ARF/CKD in setting of possible sirolimus toxicity with comcomitant diltiazem; Multaq used but later stopped due to worsening renal function and CHF. He also had hyperkalemia tx with kayexalate, limited NSVT, fever and presumed HCAP, hemoptysis, and low volume hematochezia and severe anemia with Hgb down to 5.7 requiring transfusion. Underwent EGD - wnl, though colonoscopy incomplete, so received barium enema which did not should mass or source of bleeding. Plavix was stopped that admission due to bleeding/severe anemia (also P2Y12 was 365 indicating Plavix nonresponder, h/o CVA so not Effient candidate in the future). Per notes with regard to back pain, he was recently dx with T10 lesion (?mets) per an MRI not in Epic although bone scan 02/2013 was unremarkable. Pt was discharged and returned 2/13 with CHF, anasarca, and a/c renal failure. There was concern for worsening EF on echo 30-35% although this is felt to be in line with EF by nuc - not a candidate for cath at present time given anemia and renal failure. 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 Had thoracentesis of L pleural effusion 04/29/13. RHC showing relatively normal filling pressures and issues felt primarily renal. Also having issues with intermittent confusion and being tx for healthcare associated pneumonia.  1. Acute on chronic renal failure with history of renal transplant on immunosuppressant therapy (Cr back in 2013, early Jan was in 1.7 range) - renal following. 2. SVT previously felt to represent PAT - will d/w Dr. Tamala Julian. Complex case. 3. Acute on chronic systolic and diastolic CHF - appears compensated. No ACEI due to renal insufficiency. 4 Healthcare associated PNA - on abx. 5. Prostate CA with possible T10 mets/back pain, s/p kyphoplasty and biopsy - per IM note, reveals necrosis and the appearance of "Ghost cells" that are likely  malignant.  6. Microcytic anemia with recent hemoptysis and hematochezia of unclear source- per IM. 7. CAD s/p remote stenting (BMS to LAD and chronically occluded RCA), recently abnormal nuclear stress test 03/2013, plan for medical management. Is back on aspirin. 8. Pleural effusions s/p thoracentesis 04/29/13, cytology still pending. Have asked nursing to contact lab to try to get results. 9. Significant hypoalbuminemia with severe malnutrition in the setting of chronic disease - on glucerna. Calcium corrects to 8.6. 10. Intermittent altered mental status and myoclonic  jerking - will defer to MD. ? Further evaluation  Signed, Melina Copa PA-C

## 2013-05-03 NOTE — Progress Notes (Signed)
Inpatient Diabetes Program Recommendations  AACE/ADA: New Consensus Statement on Inpatient Glycemic Control  Target Ranges:  Prepandial:   less than 140 mg/dL      Peak postprandial:   less than 180 mg/dL (1-2 hours)      Critically ill patients:  140 - 180 mg/dL  Pager:  [phone] Hours:  8 am-10pm   Reason for Visit: Elevated glucose:  Results for Joshua, [name] (MRN [id number]) as of 05/03/2013 09:16  Ref. Range 05/02/2013 06:08 05/02/2013 11:21 05/02/2013 16:46 05/02/2013 20:45 05/03/2013 06:38  Glucose-Capillary Latest Range: 70-99 mg/dL 212 (H) 173 (H) 194 (H) 259 (H) 205 (H)    Inpatient Diabetes Program Recommendations Insulin - Basal: Increase Lantus to 25 units daily Correction (SSI): Increase to Moderate Novolog Correction   Courtney Heys PhD, RN, BC-ADM Diabetes Coordinator  Office:  [phone] Team Pager:  [phone]

## 2013-05-03 NOTE — Progress Notes (Signed)
Subjective: [name] experienced an episode of sustained SVT to the 140s this AM with CP lasting about 10 minutes.  He converted to NSR 70s without intervention.  CP resolved.  No dyspnea or diaphoresis.   Objective: Vital signs in last 24 hours: Filed Vitals:   05/02/13 1057 05/02/13 1352 05/02/13 1931 05/02/13 2046  BP:  112/60  134/63  Pulse: 140 74 78 80  Temp:  97.8 F (36.6 C)  98.2 F (36.8 C)  TempSrc:  Oral  Oral  Resp:  18 18 18   Height:      Weight:      SpO2:  96%  95%   Weight change:   Intake/Output Summary (Last 24 hours) at 05/03/13 0842 Last data filed at 05/02/13 1300  Gross per 24 hour  Intake    360 ml  Output      0 ml  Net    360 ml   General: Laying in bed in NAD Cardiac: tachy which resolved , no rubs, murmur or gallops Pulm: lungs CTA B/L, no wheezing or crackles  Abd: nondistended, soft, BS present, non-tender Ext: warm and well perfused, no lower extremity edema Neuro: alert and oriented X3, mentating appropriately  Lab Results: Basic Metabolic Panel:  Recent Labs Lab 04/27/13 0915  05/01/13 1908 05/02/13 0259 05/03/13 0315  NA  --   < > 138 138 135*  K  --   < > 4.0 4.2 5.1  CL  --   < > 90* 90* 86*  CO2  --   < > 27 28 23   GLUCOSE  --   < > 92 193* 228*  BUN  --   < > 66* 69* 70*  CREATININE  --   < > 3.02* 3.15* 3.13*  CALCIUM  --   < > 6.5* 6.3* 6.6*  MG 2.0  --  2.1  --   --   PHOS  --   < >  --  5.5* 5.9*  < > = values in this interval not displayed. Liver Function Tests:  Recent Labs Lab 04/28/13 2055  05/02/13 0259 05/03/13 0315  PROT 6.7  --   --   --   ALBUMIN  --   < > 1.5* 1.5*  < > = values in this interval not displayed. CBC:  Recent Labs Lab 05/02/13 0259 05/03/13 0315  WBC 9.5 10.3  NEUTROABS  --  8.2*  HGB 7.0* 7.4*  HCT 21.6* 23.7*  MCV 76.6* 76.5*  PLT 154 172   CBG:  Recent Labs Lab 05/01/13 2035 05/02/13 0608 05/02/13 1121 05/02/13 1646 05/02/13 2045 05/03/13 0638  GLUCAP  100* 212* 173* 194* 259* 205*   Medications: I have reviewed the patient's current medications. Scheduled Meds: . albuterol  2.5 mg Nebulization Q6H  . amiodarone  200 mg Oral BID  . antiseptic oral rinse  15 mL Mouth Rinse BID  . aspirin EC  81 mg Oral Daily  . atorvastatin  80 mg Oral q1800  . calcium acetate  2,001 mg Oral TID WC  . calcium carbonate  800 mg of elemental calcium Oral QHS  . collagenase   Topical Daily  . enoxaparin (LOVENOX) injection  30 mg Subcutaneous Q24H  . feeding supplement (GLUCERNA SHAKE)  237 mL Oral BID BM  . furosemide  40 mg Oral Daily  . hydrALAZINE  25 mg Oral 3 times per day  . insulin aspart  0-9 Units Subcutaneous TID WC  . insulin glargine  17 Units Subcutaneous QHS  . isosorbide dinitrate  40 mg Oral BID  . levothyroxine  150 mcg Oral QAC breakfast  . metoprolol  100 mg Oral BID  . multivitamin with minerals  1 tablet Oral Daily  . mycophenolate  720 mg Oral BID  . pantoprazole  40 mg Oral Daily  . piperacillin-tazobactam (ZOSYN)  IV  3.375 g Intravenous 3 times per day  . sirolimus  1 mg Oral q morning - 10a  . vancomycin  1,000 mg Intravenous Q24H   Continuous Infusions: . sodium chloride 12.5 mL/hr at 05/01/13 0849   PRN Meds:.acetaminophen, diphenhydrAMINE, oxyCODONE  Assessment/Plan: 63 year old male with a PMH of HTN, DM type 2, CAD (s/p MI and LAD stent), kidney transplantation, carotid artery stenosis (s/p CEA 08/2011), PVD (s/p fem-pop in 1980), dHF (EF 45-50% Jan 2015), hx of papillary thyroid cancer and prostate cancer who presents with complaint of edema x 3 days and dyspnea for several weeks.   Volume overload:  Was likely 2/2 worsening CHF in the setting of CKD about 13ys s/p renal transplant.  CXR with small bilateral pleural effusions.  Worsening BUN and Cr and decreased urine output despite increased IV Lasix.  Right heart cath on 02/20 revealed normal PCWP and normal CO.  Dry weight appears to be 180 pounds. He is 184  pounds which is unchanged from yesterday.  Net positive 730cc yesterday.  Will hold off on increasing Lasix given his worsening creatinine.   - continue Lasix 40mg  po - continue Lopressor 100mg  BID, hydralazine 25mg  q8h; isordil 40mg  BID - daily weights and I&Os - PT following and recommend LTACH; the patient has discussed it with his wife and both are agreeable to nursing facility at discharge.  B/L pleural effusions:  Antibiotics were started on 02/20 and his dyspnea has improved.  Thoracentesis completed 02/22 and yeilded 780cc of yellow fluid.  Analysis consistent with transudate likely 2/2 to heart failure and hypoalbuminemia.  - oxygen supplementation prn - incentive spirometer  - albuterol nebs q6h  HCAP:  Fevers, leukocytosis and possible LLL infiltrate on CXR in an immunosuppressed patient.  He has been afebrile for the past 24 hours.  Leukocytosis resolved.  Blood cultures pending, NGTD. - oxygen supplementation prn - continue Vancomycin and Zosyn per pharmacy - albuterol nebs prn q6h   SVT:  Experienced sustained SVT to the 140s this AM with chest discomfort.  CP resolved once he was back in NSR.  EP to see regarding SVT and possibility of ablation. - monitor on telemetry - continue metoprolol  - continue amiodarone 200mg  BID; plan decrease to 200mg  daily in 2 weeks; low dose was started because there is potential for interaction with sirolimus.   - IV lopressor prn  - will not adjust Synthroid yet as amio may make him hyper or more hypo.    Chest pain:  Resolved.  Likely 2/2 to SVT.    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 Baseline Cr 1.9-2.5. Creatine 2.24 --> 3.15.  Had been stable around 2.8 but increased above 3 in past two days.  Possibly 2/2 to vancomycin.  Will monitor BMP.   - Continue anti-rejection meds  Acute on chronic anemia: Hgb 7.4 this AM.  Likely anemia of chronic disease. Will continue to monitor.      - consider addition  of EPO  Hypocalcemia: 6.3 on admission and he received calcium gluconate.  Repeat on 12/15 was 6.1 w/ albumin 1.6.  Corrected Ca was 8.0. However ionized Calcium low at 0.71-->0.79. Replacing Calcium.  DM type 2:  Previously on 24 units Lantus at home, but wife reported hypoglycemic episodes, so she was administering 12 units over the past few days prior to admission.  BG 157-259 in past 24 hours.  - increase Lantus 17 units --> 20 units qHS  - SSI sensitive  HTN:  Stable.  Continued Lopressor, hydralazine, lasix, isordil.  H/o prostate cancer with T10 lesion noted on prior MRI: Question of spinal mets at last hospitalization, reportedly seen on MRI, but negative bone scan 02/2013. Patient seen by Dr. Diona Fanti with Urology during prior hospitalization.  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  His back pain is being controlled with oxycodone and acetaminophen. - Continue Oxycodone IR 10mg  q 4h PRN and Tylenol  Unstageable pressure ulcer, sacral:  Patient aware of ulcer prior to admission.  Wound care consulted and recommendations appreciated. - Twice daily - cleanse with normal saline and pat dry, debridement with Santyl, moist NS dressing (pack into wound), secure with dry 4x4 and tape - pressure redistribution pad   DVT ppx: SCDs and lovenox  Dispo: Disposition is deferred at this time, awaiting improvement of current medical problems.  Anticipated discharge in approximately 1 day(s).  Likely d/c to [facility] or SNF tomorrow.  The patient does have a current PCP (Sueanne Margarita, MD) and may need an [facility] hospital follow-up appointment after discharge.  The patient does not know have transportation limitations that hinder transportation to clinic appointments.  .Services Needed at time of discharge: Y = Yes, Blank = No PT:   OT:   RN:   Equipment:   Other:     LOS: 12 days   Duwaine Maxin, DO 05/03/2013, 8:42  AM

## 2013-05-03 NOTE — Progress Notes (Signed)
Internal Medicine Attending  Date: 05/03/2013  Patient name: Joshua [name] Medical record number: [id number] Date of birth: 03-Jul-1950 Age: 63 y.o. Gender: male  I saw and evaluated the patient. I reviewed the resident's note by Dr. Redmond Pulling and I agree with the resident's findings and plans as documented in her progress note.  Mr. Blyden had an episode of SVT this AM.  Cardiology is exploring the possibility of ablation with EP.  In the meantime, I believe he remains euvolemic on his current regimen.  He has 2 more days of antibiotics for his Healthcare associated pneumonia and desperately needs continued physical rehabilitation and improved nutrition given his debility from his recent hospitalizations.  He is ready for transfer to an LTAC to receive the above.

## 2013-05-03 NOTE — Consult Note (Signed)
ELECTROPHYSIOLOGY CONSULT NOTE   Patient ID: Joshua [name] MRN: [id number], DOB/AGE: 1950-11-07   Admit date: 04/21/2013 Date of Consult: 05/03/2013  Primary Cardiologist: Meda Coffee, MD Reason for Consultation: SVT  History of Present Illness [name] is a 63 y.o. male with complex issues including an ischemic CM, EF [id number], chronic systolic HF, CAD, PSVT, mild AS, PAD s/p CEA and fem-pop bypass and ESRD s/p renal transplant who was admitted 04/21/2013 with CHF exacerbation and renal failure. While here, he has had frequent SVT. EP has been asked to see and provide recommendations regarding management of SVT, specifically EPS +RF ablation.   Of note, he is s/p T10 biopsy and kyphoplasty on 04/26/2013. 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 He has had thoracentesis of L pleural effusion 04/29/2013. RHC showing relatively normal filling pressures and issues felt primarily renal. He is also having issues with intermittent confusion and being treated currently for HCAP.  Pertinent recent history, in Jan 2015 he was admitted with chest pain. During that admission he had paroxysmal atrial tachycardia but developed acute on chronic renal failure in setting of possible sirolimus toxicity with comcomitant diltiazem use. Multaq was then used but later stopped due to worsening renal function and CHF. He was seen in consultation by Dr. Caryl Comes 03/22/2013 who recommended augmenting rate control for atrial tachycardia. Dr. Caryl Comes also felt that ablation would be a good intermediate strategy to prevent tachy-mediated CM if rate control could not be achieved. During the same admission, he developed hemoptysis, hematochezia and severe anemia with hemoglobin down to 5.7 requiring transfusion. He underwent EGD which per report was within normal limits, though colonoscopy incomplete, so received barium enema which did not should mass or source of bleeding.  Plavix was stopped due to bleeding/severe anemia. Of note, for evaluation of chest pain, he underwent a nuclear stress test which was abnormal. He was treated medically as he was not a candidate for cardiac cath given renal dysfunction and severe anemia. In addition, he was not felt to be stable for EP procedures at that time per Dr. Jackalyn Lombard progress note from 04/04/2013.  Past Medical History Past Medical History  Diagnosis Date  . Carotid artery occlusion   . ASCVD (arteriosclerotic cardiovascular disease)   . CHF (congestive heart failure)   . Anginal pain     last chest pain was a couple of months ago-just lasted less than a minute  . Stroke     "light stroke" about time of MI  . Renal failure     s/p renal transplant  . Aortic stenosis     moderate AS by 06/2011 echo (Dr. Fransico Him)  . Coronary artery disease 03/2009    2 vessel s/p BMS to LAD in setting of NSTEMI, RCA chronically occluded  . PVD (peripheral vascular disease)     s/p fempop 1980's  . Hypothyroidism     s/p papillary thyroid CA excision  . Hypertension   . Hyperlipidemia   . Diastolic dysfunction   . Type II diabetes mellitus   . Prostate cancer     "radiation tx ~ 4-5 yr ago; cleared then came back a couple months ago" (03/20/2013)  . Plavix resistance     a. P2y12 365 in 03/2013.  . End stage renal disease- s/p transplant 04/03/2013    Patient states he started dialysis sometime around 1981-1982.  He then received a kidney transplant in 2002 at [facility].  The transplant kidney is still  functioning, he sees Dr Florene Glen at [facility] about every 6 months as of 2015.      Past Surgical History Past Surgical History  Procedure Laterality Date  . Nephrectomy transplanted organ Bilateral 2001  . Pr vein bypass graft,aorto-fem-pop  1980    fem pop   . Endarterectomy  08/25/2011    Procedure: ENDARTERECTOMY CAROTID;  Surgeon: Angelia Mould, MD;  Location: [place];  Service: Vascular;  Laterality: Right;  . Thyroidectomy     . Coronary angioplasty with stent placement  03/2009    "1"  . Colonoscopy N/A 03/31/2013    Procedure: COLONOSCOPY;  Surgeon: Lafayette Dragon, MD;  Location: [facility] ENDOSCOPY;  Service: Endoscopy;  Laterality: N/A;  . Esophagogastroduodenoscopy N/A 03/31/2013    Procedure: ESOPHAGOGASTRODUODENOSCOPY (EGD);  Surgeon: Lafayette Dragon, MD;  Location: [facility] ENDOSCOPY;  Service: Endoscopy;  Laterality: N/A;    Allergies/Intolerances Allergies  Allergen Reactions  . Fentanyl     "Almost died"   . Propofol     "Almost died"     Inpatient Medications . albuterol  2.5 mg Nebulization Q6H  . amiodarone  200 mg Oral BID  . antiseptic oral rinse  15 mL Mouth Rinse BID  . aspirin EC  81 mg Oral Daily  . atorvastatin  80 mg Oral q1800  . calcium acetate  2,001 mg Oral TID WC  . calcium carbonate  800 mg of elemental calcium Oral QHS  . collagenase   Topical Daily  . enoxaparin (LOVENOX) injection  30 mg Subcutaneous Q24H  . feeding supplement (GLUCERNA SHAKE)  237 mL Oral BID BM  . furosemide  40 mg Oral Daily  . hydrALAZINE  25 mg Oral 3 times per day  . insulin aspart  0-9 Units Subcutaneous TID WC  . insulin glargine  17 Units Subcutaneous QHS  . isosorbide dinitrate  40 mg Oral BID  . levothyroxine  150 mcg Oral QAC breakfast  . metoprolol  100 mg Oral BID  . multivitamin with minerals  1 tablet Oral Daily  . mycophenolate  720 mg Oral BID  . pantoprazole  40 mg Oral Daily  . piperacillin-tazobactam (ZOSYN)  IV  3.375 g Intravenous 3 times per day  . sirolimus  1 mg Oral q morning - 10a  . vancomycin  1,000 mg Intravenous Q24H   . sodium chloride 12.5 mL/hr at 05/01/13 0849    Family History Family History  Problem Relation Age of Onset  . Diabetes Mother   . Hyperlipidemia Mother   . Hypertension Mother   . Heart disease Mother     before age 13  . Hyperlipidemia Father   . Hypertension Father   . Heart disease Father   . Hyperlipidemia Sister   . Hypertension Sister   .  Diabetes Brother   . Hypertension Brother   . Hyperlipidemia Brother   . Seizures Brother      Social History History   Social History  . Marital Status: Married    Spouse Name: N/A    Number of Children: N/A  . Years of Education: N/A   Occupational History  . Not on file.   Social History Main Topics  . Smoking status: Never Smoker   . Smokeless tobacco: Never Used  . Alcohol Use: No  . Drug Use: No  . Sexual Activity: Yes   Other Topics Concern  . Not on file   Social History Narrative  . No narrative on file     Review  of Systems General: No chills, fever, night sweats or weight changes  Cardiovascular:  No chest pain, dyspnea on exertion, edema, orthopnea, palpitations, paroxysmal nocturnal dyspnea Dermatological: No rash, lesions or masses Respiratory: No cough, dyspnea Urologic: No hematuria, dysuria Abdominal: No nausea, vomiting, diarrhea, bright red blood per rectum, melena, or hematemesis Neurologic: No visual changes, weakness, changes in mental status All other systems reviewed and are otherwise negative except as noted above.  Physical Exam Vitals: Blood pressure 134/63, pulse 80, temperature 98.2 F (36.8 C), temperature source Oral, resp. rate 18, height 5\' 11"  (1.803 m), weight 184 lb 8.4 oz (83.7 kg), SpO2 95.00%.  General: thin, chronically ill appearing 63 y.o. male in no acute distress. HEENT: Normocephalic, atraumatic. EOMs intact. Sclera nonicteric. Oropharynx clear.  Neck: Supple. No JVD. Lungs: Respirations regular and unlabored, CTA bilaterally. No wheezes, rales or rhonchi. Heart: Regular. S1, S2 present. No murmurs, rub, S3 or S4. Abdomen: Soft, non-tender, non-distended. BS present x 4 quadrants.  Extremities: No clubbing, cyanosis or edema. DP/PT/Radials 2+ and equal bilaterally. Psych: Normal affect. Neuro: Alert and oriented X 3. Moves all extremities spontaneously.  + asterixis/ diffuse myoclonic events Musculoskeletal: No  kyphosis. Skin: multiple skin lesions diffusely across the chest   Labs  Recent Labs  05/01/13 2101 05/02/13 0259 05/02/13 0902  TROPONINI <0.30 <0.30 <0.30   Lab Results  Component Value Date   WBC 10.3 05/03/2013   HGB 7.4* 05/03/2013   HCT 23.7* 05/03/2013   MCV 76.5* 05/03/2013   PLT 172 05/03/2013    Recent Labs Lab 04/28/13 2055  05/03/13 0315  NA  --   < > 135*  K  --   < > 5.1  CL  --   < > 86*  CO2  --   < > 23  BUN  --   < > 70*  CREATININE 2.80*  < > 3.13*  CALCIUM  --   < > 6.6*  PROT 6.7  --   --   GLUCOSE  --   < > 228*  < > = values in this interval not displayed.   Recent Labs  05/01/13 1245  RETICCTPCT 0.5    Radiology/Studies Dg Chest 1 View  04/29/2013   CLINICAL DATA:  Thoracentesis  EXAM: CHEST - 1 VIEW  COMPARISON:  DG CHEST 2 VIEW dated 04/27/2013  FINDINGS: The heart remains moderately enlarged. Left pleural effusion resolve after the thoracentesis. No ensuing pneumothorax. Overall, vascular congestion and edema have also improved. Right pleural effusion has probably also improved.  IMPRESSION: No pneumothorax post left thoracentesis.   Electronically Signed   By: Maryclare Bean M.D.   On: 04/29/2013 11:27   Dg Chest 2 View  04/27/2013   CLINICAL DATA:  Shortness of breath, dyspnea, weakness  EXAM: CHEST  2 VIEW  COMPARISON:  DG CHEST 1V PORT dated 04/27/2013; DG CHEST 2 VIEW dated 04/24/2013; DG CHEST 2 VIEW dated 04/22/2013  FINDINGS: Grossly unchanged enlarged cardiac silhouette and mediastinal contours. The pulmonary vasculature is less distinct on the present examination with cephalization of flow. Worsening perihilar and bilateral medial basilar heterogeneous/consolidative opacities, left greater than right. Interval development of small bilateral pleural effusions. No pneumothorax. Grossly unchanged bones. Multiple surgical clips overlie the midline of the lower neck. A vascular stent overlies the inner aspect of the left upper arm. Surgical clips  overlie the bilateral axilla.  IMPRESSION: Findings most suggestive of worsening pulmonary edema, small bilateral effusions and perihilar/medial basilar opacities, left greater than right, atelectasis  versus infiltrate.   Electronically Signed   By: Sandi Mariscal M.D.   On: 04/27/2013 11:29   Ir Kypho Vertebral Thoracic Augmentation  04/26/2013   CLINICAL DATA:  Prostate carcinoma. Multiple axial lesions on thoracic and lumbar MRI. Painful compression fracture deformity of T12.  EXAM: 1.  DEEP BONE BIOPSY THORACIC T12 UNDER FLUOROSCOPY  2.  KYPHOPLASTY AT THORACIC T12  TECHNIQUE: The procedure, risks (including but not limited to bleeding, infection, organ damage), benefits, and alternatives were explained to the patient. Questions regarding the procedure were encouraged and answered. The patient understands and consents to the procedure.  The patient was placed prone on the fluoroscopic table. The skin overlying the lower thoracic region was then prepped and draped in the usual sterile fashion. Maximal barrier sterile technique was utilized including caps, mask, sterile gowns, sterile gloves, sterile drape, hand hygiene and skin antiseptic.  Intravenous Fentanyl and Versed were administered as conscious sedation during continuous cardiorespiratory monitoring by the radiology RN, with a total moderate sedation time of40 minutes.  As antibiotic prophylaxis, !cefazolin 1 gram! was ordered pre-procedure and administered intravenously within !one hour! of incision.  The thoracic compression deformity of T12 was localized.The right pedicle at T12 was then infiltrated with 1% lidocaine followed by the advancement of a Kyphon trocar needle through the right pedicle into the posterior one-third. The trocar was removed and a coaxial core bone biopsy was obtained. Subsequently, The osteo drill was advanced to the anterior third of the vertebral body. The osteo drill was retracted. Through the working cannula, a Kyphon  inflatable bone tamp 15 x 3 was advanced and positioned with the distal marker 5 mm from the anterior aspect of the cortex. Crossing of the midline was not seen on the AP projection. At this time, the balloon was expanded using contrast via a Kyphon inflation syringe device via micro tubing.  In similar fashion, the left pedicle was infiltrated with 1% lidocaine followed by the advancement of a second Kyphon trocar needle through the left pedicle into the posterior third of the vertebral body. Coaxial core deep bone biopsy sample obtained. The osteo drill was coaxially advanced to the anterior right third. The osteo drill was exchanged for a Kyphon inflatable bone tamp 15x 3, advanced to the 5 mm of the anterior aspect of the cortex. The balloon was then expanded using contrast as above.  Inflations were continued until there was near apposition across the midline and approaching the superior and the inferior end plates.  At this time, methylmethacrylate mixture was reconstituted in the Kyphon bone mixing device system. This was then loaded into the delivery mechanism, attached to Kyphon bone fillers.  The balloons were deflated and removed followed by the instillation of methylmethacrylate mixture with excellent filling in the AP and lateral projections. No extravasation was noted in the disk spaces or posteriorly into the spinal canal. No epidural venous contamination was seen.  The patient tolerated the procedure well. There were no acute complications. The working cannulae and the bone filler were then retrieved and removed.  IMPRESSION: 1. Deep bone core biopsy of thoracic T12 lesion. 2. Status post vertebral body augmentation using balloon kyphoplasty at thoracic T12 as described without event.   Electronically Signed   By: Arne Cleveland M.D.   On: 04/26/2013 18:14   US Thoracentesis Asp Pleural Space W/img Guide  04/29/2013   CLINICAL DATA:  Patient with history of dyspnea, CHF, bilateral pleural  effusions, coronary artery disease, chronic kidney disease, prostate cancer ;  request is made for left thoracentesis.  EXAM: ULTRASOUND GUIDED DIAGNOSTIC AND THERAPEUTIC LEFT THORACENTESIS  COMPARISON:  None.  FINDINGS: A total of approximately 780 cc's of yellow fluid was removed. The fluid sample was sent for laboratory analysis.  IMPRESSION: Successful ultrasound guided diagnostic/therapeutic left thoracentesis yielding 780 cc's of pleural fluid.  Read by: Rowe Robert ,P.A.-C.  PROCEDURE: An ultrasound guided thoracentesis was thoroughly discussed with the patient and questions answered. The benefits, risks, alternatives and complications were also discussed. The patient understands and wishes to proceed with the procedure. Written consent was obtained.  Ultrasound was performed to localize and mark an adequate pocket of fluid in the left chest. The area was then prepped and draped in the normal sterile fashion. 1% Lidocaine was used for local anesthesia. Under ultrasound guidance a 19 gauge Yueh catheter was introduced. Thoracentesis was performed. The catheter was removed and a dressing applied.  Complications:  none   Electronically Signed   By: Maryclare Bean M.D.   On: 04/29/2013 11:11   Echocardiogram  Study Conclusions - Left ventricle: Systolic function was moderately to severely reduced. The estimated ejection fraction was in the range of 30% to 35%. Diffuse hypokinesis. Moderate hypokinesis of the inferior myocardium. - Aortic valve: There was very mild stenosis. Valve area: 1.22cm^2(VTI). Valve area: 1.37cm^2 (Vmax). - Right ventricle: The cavity size was mildly decreased. - Pulmonary arteries: Systolic pressure was mildly increased. PA peak pressure: 22mm Hg (S). - Pericardium, extracardiac: A trivial pericardial effusion was identified.  12-lead ECG today - NSR at 79 bpm with borderline prolonged QT  Telemetry reviewed - SR with paroxysms of atrial tachycardia with rates in the 130s    Assessment and Plan 1. 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 Acute on chronic renal failure 3. Acute on chronic systolic HF 4. Ischemic CM, EF 30-35% 5. CAD 6. Anemia 7. Confusion 8. PNA 9. Prostate CA    The patient is quite ill with multiple acute medical issues.  These are likely driving his current episodes of SVT.  He is not very symptomatic and tolerates them reasonably well.  Unfortunately, he is a poor candidate for ablation at this time.  I would therefore advise medical therapy. Continue metoprolol Amiodarone has been added. No further inpatient EP workup is planned.  He can go to LTAC from my standpoint.  Once he is clinically improved, I would be happy to see him in the office to evaluate for elective ablation at that time.  Presently, he voices that he would like to avoid such procedures if possible. His prognosis is guarded at best.  Ultimately, a more palliative approach may be best.  Call with questions.

## 2013-05-03 NOTE — Progress Notes (Signed)
Will see if PSVT ablation is an option that may allow [place] to stop Amiodarone.

## 2013-05-04 DIAGNOSIS — E43 Unspecified severe protein-calorie malnutrition: Secondary | ICD-10-CM

## 2013-05-04 LAB — RENAL FUNCTION PANEL
Albumin: 1.5 g/dL — ABNORMAL LOW (ref 3.5–5.2)
BUN: 75 mg/dL — AB (ref 6–23)
CALCIUM: 6.5 mg/dL — AB (ref 8.4–10.5)
CO2: 24 mEq/L (ref 19–32)
CREATININE: 3.45 mg/dL — AB (ref 0.50–1.35)
Chloride: 89 mEq/L — ABNORMAL LOW (ref 96–112)
GFR calc Af Amer: 21 mL/min — ABNORMAL LOW (ref >90)
GFR calc non Af Amer: 18 mL/min — ABNORMAL LOW (ref >90)
Glucose, Bld: 162 mg/dL — ABNORMAL HIGH (ref 70–99)
PHOSPHORUS: 6 mg/dL — AB (ref 2.3–4.6)
Potassium: 4.5 mEq/L (ref 3.7–5.3)
Sodium: 136 mEq/L — ABNORMAL LOW (ref 137–147)

## 2013-05-04 LAB — CULTURE, BLOOD (ROUTINE X 2): Culture: NO GROWTH

## 2013-05-04 LAB — GLUCOSE, CAPILLARY
GLUCOSE-CAPILLARY: 91 mg/dL (ref 70–99)
GLUCOSE-CAPILLARY: 145 mg/dL — AB (ref 70–99)
Glucose-Capillary: 123 mg/dL — ABNORMAL HIGH (ref 70–99)
Glucose-Capillary: 122 mg/dL — ABNORMAL HIGH (ref 70–99)
Glucose-Capillary: 98 mg/dL (ref 70–99)

## 2013-05-04 MED ORDER — FUROSEMIDE 40 MG PO TABS
40.0000 mg | ORAL_TABLET | Freq: Once | ORAL | Status: AC
  Administered 2013-05-04: 40 mg via ORAL
  Filled 2013-05-04: qty 1

## 2013-05-04 NOTE — Progress Notes (Signed)
Internal Medicine Attending  Date: 05/04/2013  Patient name: Joshua [name] Medical record number: [id number] Date of birth: 1950/04/25 Age: 63 y.o. Gender: male  I saw and evaluated the patient. I reviewed the resident's note by Dr. Redmond Pulling and I agree with the resident's findings and plans as documented in her progress note.  [name] remains weak and is very interested in rehab.  An LTAC bed was not available today but may become available tomorrow.  If it does he is first on the wait list.  We will continue current therapy today other than a 1 day increase in his lasix to try to maintain his dry weight.

## 2013-05-04 NOTE — Consult Note (Addendum)
ANTIBIOTIC CONSULT NOTE - INITIAL  Pharmacy Consult for vancomycin/zosyn Indication: pneumonia (HCAP)  Allergies  Allergen Reactions  . Fentanyl     "Almost died"   . Propofol     "Almost died"     Patient Measurements: Height: 5\' 11"  (180.3 cm) Weight: 187 lb 13.3 oz (85.2 kg) IBW/kg (Calculated) : 75.3  Vital Signs: Temp: 98.4 F (36.9 C) (02/26 1310) Temp src: Oral (02/26 1310) BP: 129/73 mmHg (02/26 1310) Pulse Rate: 66 (02/26 1310) Intake/Output from previous day: 02/25 0701 - 02/26 0700 In: 600 [P.O.:600] Out: 101 [Urine:100; Stool:1]  Labs:  Recent Labs  05/02/13 0259 05/03/13 0315 05/04/13 0244  WBC 9.5 10.3  --   HGB 7.0* 7.4*  --   PLT 154 172  --   CREATININE 3.15* 3.13* 3.45*   Estimated Creatinine Clearance: 23.9 ml/min (by C-G formula based on Cr of 3.45).  Medical History: Past Medical History  Diagnosis Date  . Carotid artery occlusion   . ASCVD (arteriosclerotic cardiovascular disease)   . CHF (congestive heart failure)   . Anginal pain     last chest pain was a couple of months ago-just lasted less than a minute  . Stroke     "light stroke" about time of MI  . Renal failure     s/p renal transplant  . Aortic stenosis     moderate AS by 06/2011 echo (Dr. Fransico Him)  . Coronary artery disease 03/2009    2 vessel s/p BMS to LAD in setting of NSTEMI, RCA chronically occluded  . PVD (peripheral vascular disease)     s/p fempop 1980's  . Hypothyroidism     s/p papillary thyroid CA excision  . Hypertension   . Hyperlipidemia   . Diastolic dysfunction   . Type II diabetes mellitus   . Prostate cancer     "radiation tx ~ 4-5 yr ago; cleared then came back a couple months ago" (03/20/2013)  . Plavix resistance     a. P2y12 365 in 03/2013.  . End stage renal disease- s/p transplant 04/03/2013    Patient states he started dialysis sometime around 1981-1982.  He then received a kidney transplant in 2002 at [facility].  The transplant kidney is  still functioning, he sees Dr Florene Glen at [facility] about every 6 months as of 2015.      Medications:  See med list  Assessment: 63 yo M admitted with SOB and volume overload. PMH is significant for CKD (13 years s/p renal transplant), CAD, PVD, HTN, and prostate cancer. CXR shows possible LLL infiltrate. Due to this, as well as in combination with fever and leukocytosis in an immunosuppressed patient, pharmacy has been consulted to begin vancomycin and zosyn for HCAP treatment.  Antibiotic end dates have been placed to complete a 7 day course of both vanc + zosyn.  Vancomycin 2/20 >> Zosyn 2/21 >>  2/20 blood x2 >> ngtd  Goal of Therapy:  Vancomycin trough level 15-20 mcg/ml  Plan:  -Continue vancomycin 1000 mg IV q 24 h -> last dose today -Continue zosyn 3.375g IV q 8 hw -> last dose tomorrow  Hughes Better, PharmD, BCPS Clinical Pharmacist Pager: [phone] 05/04/2013 1:45 PM

## 2013-05-04 NOTE — Progress Notes (Signed)
RN did pt dressing change. Cleansed the wound with NS and patted dry before applying Santyl to the wound topically and packing the wound with a wet 2x2. RN placed a mepilex on top of the 2x2. Pt tolerated dressing change well. Coolidge Breeze, RN

## 2013-05-04 NOTE — Clinical Social Work Note (Signed)
Clinical Social Worker continuing to follow patient and family for support and discharge planning needs.  Per CM, patient will likely be transferred to [facility] tomorrow pending bed availability.  CSW remains available for support and to assist with SNF discharge if no bed available.  Barbette Or, [place]

## 2013-05-04 NOTE — Progress Notes (Addendum)
NUTRITION FOLLOW UP  DOCUMENTATION CODES Per approved criteria  -Severe malnutrition in the context of chronic illness   Intervention:    Continue Glucerna Shake po BID, each supplement provides 220 kcal and 10 grams of protein  Continue MVI with minerals daily RD to follow for nutrition care plan  Nutrition Dx:   Inadequate oral intake related to chronic illness as evidenced by weight loss and decreased intake, improved  Goal:   Pt to meet >/= 90% of their estimated nutrition needs, progressing  Monitor:   PO & supplemental intake, weight, labs, I/O's  Assessment:   Patient with PMH of HTN, DM-II, CAD, hx of paillary thyroid cancer, kidney transplantation, carotid artery stenosis, PVD, and prostate cancer. Recently hospitalized 2 weeks ago. Patient presents edema and dyspnea x 3 days.  Patient s/p T10 bone biopsy and kyphoplasty done on 04/26/13.  Patient speaking with medical team at time of RD visit. Per RN, ate well ate breakfast.  Drinking some of his Glucerna Shakes -- declined this AM.  PO intake remains variable at 25-90% per flowsheet records.    CWOCN following for sacral pressure ulcer.  PT recommending LTACH -- patient has discussed it with his wife and both are agreeable to nursing facility at discharge  Height: Ht Readings from Last 1 Encounters:  04/21/13 5\' 11"  (1.803 m)    Weight Status:   Wt Readings from Last 1 Encounters:  05/04/13 187 lb 13.3 oz (85.2 kg)    Re-estimated needs:  Kcal: 2000-2300  Protein: 90-100 grams  Fluid: 1.2 L restriction per MD  Skin: +1 RLE and LLE edema; unstageable pressure ulcer on sacrum  Diet Order: Carb Control   Intake/Output Summary (Last 24 hours) at 05/04/13 1018 Last data filed at 05/04/13 0900  Gross per 24 hour  Intake    720 ml  Output    301 ml  Net    419 ml    Labs:   Recent Labs Lab 05/01/13 1908 05/02/13 0259 05/03/13 0315 05/04/13 0244  NA 138 138 135* 136*  K 4.0 4.2 5.1 4.5  CL  90* 90* 86* 89*  CO2 27 28 23 24   BUN 66* 69* 70* 75*  CREATININE 3.02* 3.15* 3.13* 3.45*  CALCIUM 6.5* 6.3* 6.6* 6.5*  MG 2.1  --   --   --   PHOS  --  5.5* 5.9* 6.0*  GLUCOSE 92 193* 228* 162*    CBG (last 3)   Recent Labs  05/03/13 2130 05/04/13 0614 05/04/13 0747  GLUCAP 234* 123* 122*    Scheduled Meds: . albuterol  2.5 mg Nebulization Q6H  . amiodarone  200 mg Oral BID  . antiseptic oral rinse  15 mL Mouth Rinse BID  . aspirin EC  81 mg Oral Daily  . atorvastatin  80 mg Oral q1800  . calcium acetate  2,001 mg Oral TID WC  . calcium carbonate  800 mg of elemental calcium Oral QHS  . collagenase   Topical Daily  . enoxaparin (LOVENOX) injection  30 mg Subcutaneous Q24H  . feeding supplement (GLUCERNA SHAKE)  237 mL Oral BID BM  . furosemide  40 mg Oral Daily  . hydrALAZINE  25 mg Oral 3 times per day  . insulin aspart  0-9 Units Subcutaneous TID WC  . insulin glargine  20 Units Subcutaneous QHS  . isosorbide dinitrate  40 mg Oral BID  . levothyroxine  150 mcg Oral QAC breakfast  . metoprolol  100 mg Oral BID  .  multivitamin with minerals  1 tablet Oral Daily  . mycophenolate  720 mg Oral BID  . pantoprazole  40 mg Oral Daily  . piperacillin-tazobactam (ZOSYN)  IV  3.375 g Intravenous 3 times per day  . sirolimus  1 mg Oral q morning - 10a  . vancomycin  1,000 mg Intravenous Q24H    Continuous Infusions: . sodium chloride 12.5 mL/hr at 05/01/13 0849    Arthur Holms, RD, LDN Pager #: [phone] After-Hours Pager #: [phone]

## 2013-05-04 NOTE — Progress Notes (Signed)
Please call if we can be of further help.

## 2013-05-04 NOTE — Progress Notes (Addendum)
Subjective: [name] was seen and examined this morning.  He is feeling well.  Agreeable to discharge to LTAC vs SNF but would like [place] to talk with wife first.    Objective: Vital signs in last 24 hours: Filed Vitals:   05/04/13 0333 05/04/13 0634 05/04/13 0726 05/04/13 0754  BP:  120/70  120/63  Pulse:  70  70  Temp:  98.2 F (36.8 C)  98.4 F (36.9 C)  TempSrc:  Oral  Oral  Resp:  18  18  Height:      Weight:      SpO2: 97% 96% 93% 96%   Weight change:   Intake/Output Summary (Last 24 hours) at 05/04/13 7829 Last data filed at 05/04/13 0254  Gross per 24 hour  Intake    480 ml  Output    101 ml  Net    379 ml   General: Laying in bed in NAD Cardiac: RRR, no rubs, murmur or gallops Pulm: some crackles at the right lung base, otherwise clear Abd: + abdominal distention, BS present, non-tender Ext: warm and well perfused, no lower extremity edema Neuro: alert and oriented X3, mentating appropriately  Lab Results: Basic Metabolic Panel:  Recent Labs Lab 04/27/13 0915  05/01/13 1908  05/03/13 0315 05/04/13 0244  NA  --   < > 138  < > 135* 136*  K  --   < > 4.0  < > 5.1 4.5  CL  --   < > 90*  < > 86* 89*  CO2  --   < > 27  < > 23 24  GLUCOSE  --   < > 92  < > 228* 162*  BUN  --   < > 66*  < > 70* 75*  CREATININE  --   < > 3.02*  < > 3.13* 3.45*  CALCIUM  --   < > 6.5*  < > 6.6* 6.5*  MG 2.0  --  2.1  --   --   --   PHOS  --   < >  --   < > 5.9* 6.0*  < > = values in this interval not displayed. Liver Function Tests:  Recent Labs Lab 04/28/13 2055  05/03/13 0315 05/04/13 0244  PROT 6.7  --   --   --   ALBUMIN  --   < > 1.5* 1.5*  < > = values in this interval not displayed.  CBG:  Recent Labs Lab 05/02/13 2045 05/03/13 0638 05/03/13 1129 05/03/13 2130 05/04/13 0614 05/04/13 0747  GLUCAP 259* 205* 157* 234* 123* 122*   Medications: I have reviewed the patient's current medications. Scheduled Meds: . albuterol  2.5 mg Nebulization Q6H    . amiodarone  200 mg Oral BID  . antiseptic oral rinse  15 mL Mouth Rinse BID  . aspirin EC  81 mg Oral Daily  . atorvastatin  80 mg Oral q1800  . calcium acetate  2,001 mg Oral TID WC  . calcium carbonate  800 mg of elemental calcium Oral QHS  . collagenase   Topical Daily  . enoxaparin (LOVENOX) injection  30 mg Subcutaneous Q24H  . feeding supplement (GLUCERNA SHAKE)  237 mL Oral BID BM  . furosemide  40 mg Oral Daily  . hydrALAZINE  25 mg Oral 3 times per day  . insulin aspart  0-9 Units Subcutaneous TID WC  . insulin glargine  20 Units Subcutaneous QHS  . isosorbide dinitrate  40  mg Oral BID  . levothyroxine  150 mcg Oral QAC breakfast  . metoprolol  100 mg Oral BID  . multivitamin with minerals  1 tablet Oral Daily  . mycophenolate  720 mg Oral BID  . pantoprazole  40 mg Oral Daily  . piperacillin-tazobactam (ZOSYN)  IV  3.375 g Intravenous 3 times per day  . sirolimus  1 mg Oral q morning - 10a  . vancomycin  1,000 mg Intravenous Q24H   Continuous Infusions: . sodium chloride 12.5 mL/hr at 05/01/13 0849   PRN Meds:.acetaminophen, diphenhydrAMINE, oxyCODONE  Assessment/Plan: 63 year old male with a PMH of HTN, DM type 2, CAD (s/p MI and LAD stent), kidney transplantation, carotid artery stenosis (s/p CEA 08/2011), PVD (s/p fem-pop in 1980), dHF (EF 45-50% Jan 2015), hx of papillary thyroid cancer and prostate cancer who presents with complaint of edema x 3 days and dyspnea for several weeks.   Volume overload:  Was likely 2/2 worsening CHF in the setting of CKD about 13ys s/p renal transplant.  CXR with small bilateral pleural effusions.  Worsening BUN and Cr and decreased urine output despite increased IV Lasix.  Right heart cath on 02/20 revealed normal PCWP and normal CO.  Dry weight appears to be 180 pounds.  Net positive 499cc yesterday and up another 3 pounds today (187 pounds).   - 80mg  Lasix po today - continue Lopressor 100mg  BID, hydralazine 25mg  q8h; isordil 40mg   BID - daily weights and I&Os - Plans for LTACH at d/c; spoke with wife and she is agreeable to [facility]; no bed available today but will likely d/c to [facility] tomorrow.    B/L pleural effusions:  Antibiotics were started on 02/20 and his dyspnea has improved.  Thoracentesis completed 02/22 and yeilded 780cc of yellow fluid.  Analysis consistent with transudate likely 2/2 to heart failure and hypoalbuminemia.  Pleural fluid culture negative. - oxygen supplementation prn - incentive spirometer  - albuterol nebs q6h  HCAP:  Fevers, leukocytosis and possible LLL infiltrate on CXR in an immunosuppressed patient.  No recent fevers and no leukocytosis.  Final blood cultures - no growth. - oxygen supplementation prn - continue Vancomycin (last day 02/26) and Zosyn (last dose 02/27) per pharmacy - albuterol nebs prn q6h   SVT:  Episodes of sustained SVT to the 140s with chest discomfort.  CP resolved once he was back in NSR.  EP to see regarding SVT and possibility of ablation. - monitor on telemetry - continue metoprolol  - continue amiodarone 200mg  BID; plan decrease to 200mg  daily in 2 weeks; low dose was started because there is potential for interaction with sirolimus.   - IV lopressor prn  - will not adjust Synthroid yet as amio may make him hyper or more hypo.    Chest pain:  Resolved.  Likely 2/2 to SVT.    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 Baseline Cr 1.9-2.5. Creatine 2.24 --> 3.45.  Had been stable around 2.8 but increased above 3 in past few days.  Possibly 2/2 to vancomycin.  Will monitor BMP.   - Continue anti-rejection meds  Acute on chronic anemia: Stable.  Likely anemia of chronic disease.  - consider addition of EPO   Hypocalcemia: 6.3 on admission and he received calcium gluconate.  Repeat on 12/15 was 6.1 w/ albumin 1.6. Corrected Ca was 8.0. However ionized Calcium low at 0.71-->0.79. Replacing Calcium.  DM type 2:   Previously on 24  units Lantus at home, but wife reported hypoglycemic episodes, so she was administering 12 units over the past few days prior to admission.  BG 98-234 in past 24 hours.  - continue Lantus 20 units qHS  - SSI sensitive  HTN:  Stable.  Continued Lopressor, hydralazine, lasix, isordil.  H/o prostate cancer with T10 lesion noted on prior MRI: Question of spinal mets at last hospitalization, reportedly seen on MRI, but negative bone scan 02/2013. Patient seen by Dr. Diona Fanti with Urology during prior hospitalization.  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  His back pain is being controlled with oxycodone and acetaminophen. - Continue Oxycodone IR 10mg  q 4h PRN and Tylenol  Unstageable pressure ulcer, sacral:  Patient aware of ulcer prior to admission.  Wound care consulted and recommendations appreciated. - Twice daily - cleanse with normal saline and pat dry, debridement with Santyl, moist NS dressing (pack into wound), secure with dry 4x4 and tape - pressure redistribution pad   Severe malnutrition in the context of chronic illness:  Continue Glucerna shakes, multivitamin.  DVT ppx: SCDs and lovenox  Dispo: Disposition is deferred at this time, awaiting improvement of current medical problems.  Anticipated discharge in approximately 1 day(s).  Likely d/c to [facility] tomorrow pending bed availability.  The patient does have a current PCP (Sueanne Margarita, MD) and may need an [facility] hospital follow-up appointment after discharge.  The patient does not know have transportation limitations that hinder transportation to clinic appointments.  .Services Needed at time of discharge: Y = Yes, Blank = No PT:   OT:   RN:   Equipment:   Other:     LOS: 13 days   Duwaine Maxin, DO 05/04/2013, 8:12 AM

## 2013-05-05 DIAGNOSIS — I5030 Unspecified diastolic (congestive) heart failure: Secondary | ICD-10-CM

## 2013-05-05 LAB — GLUCOSE, CAPILLARY
GLUCOSE-CAPILLARY: 62 mg/dL — AB (ref 70–99)
Glucose-Capillary: 60 mg/dL — ABNORMAL LOW (ref 70–99)
Glucose-Capillary: 66 mg/dL — ABNORMAL LOW (ref 70–99)
Glucose-Capillary: 87 mg/dL (ref 70–99)
Glucose-Capillary: 108 mg/dL — ABNORMAL HIGH (ref 70–99)
Glucose-Capillary: 116 mg/dL — ABNORMAL HIGH (ref 70–99)

## 2013-05-05 LAB — CULTURE, BLOOD (ROUTINE X 2): Culture: NO GROWTH

## 2013-05-05 MED ORDER — FUROSEMIDE 40 MG PO TABS
40.0000 mg | ORAL_TABLET | Freq: Once | ORAL | Status: AC
  Administered 2013-05-05: 40 mg via ORAL
  Filled 2013-05-05: qty 1

## 2013-05-05 MED ORDER — GLUCOSE 40 % PO GEL
ORAL | Status: AC
  Administered 2013-05-05: 37.5 g
  Filled 2013-05-05: qty 1

## 2013-05-05 NOTE — Progress Notes (Signed)
Subjective: [name] was seen and examined this morning.  He is agreeable to going to LTAC once a bed becomes available.  Objective: Vital signs in last 24 hours: Filed Vitals:   05/05/13 0438 05/05/13 0740 05/05/13 1023 05/05/13 1314  BP: 132/62 139/68  156/139  Pulse: 69 71    Temp: 97.9 F (36.6 C)     TempSrc: Oral     Resp: 16     Height:      Weight: 85.1 kg (187 lb 9.8 oz)     SpO2: 99% 98% 93%    Weight change: -0.1 kg (-3.5 oz)  Intake/Output Summary (Last 24 hours) at 05/05/13 1448 Last data filed at 05/05/13 1300  Gross per 24 hour  Intake    480 ml  Output   1202 ml  Net   -722 ml   General: Laying in bed in NAD Cardiac: RRR, no rubs, murmur or gallops Pulm: + wheezing Abd: + abdominal distention, BS present, non-tender Ext: warm and well perfused, 1+ lower extremity edema Neuro: alert and oriented X3  Lab Results: Basic Metabolic Panel:  Recent Labs Lab 05/01/13 1908  05/03/13 0315 05/04/13 0244  NA 138  < > 135* 136*  K 4.0  < > 5.1 4.5  CL 90*  < > 86* 89*  CO2 27  < > 23 24  GLUCOSE 92  < > 228* 162*  BUN 66*  < > 70* 75*  CREATININE 3.02*  < > 3.13* 3.45*  CALCIUM 6.5*  < > 6.6* 6.5*  MG 2.1  --   --   --   PHOS  --   < > 5.9* 6.0*  < > = values in this interval not displayed. Liver Function Tests:  Recent Labs Lab 04/28/13 2055  05/03/13 0315 05/04/13 0244  PROT 6.7  --   --   --   ALBUMIN  --   < > 1.5* 1.5*  < > = values in this interval not displayed.  CBG:  Recent Labs Lab 05/04/13 2103 05/05/13 0627 05/05/13 0647 05/05/13 0700 05/05/13 0732 05/05/13 1109  GLUCAP 145* 60* 62* 66* 87 108*   Medications: I have reviewed the patient's current medications. Scheduled Meds: . albuterol  2.5 mg Nebulization Q6H  . amiodarone  200 mg Oral BID  . antiseptic oral rinse  15 mL Mouth Rinse BID  . aspirin EC  81 mg Oral Daily  . atorvastatin  80 mg Oral q1800  . calcium acetate  2,001 mg Oral TID WC  . calcium  carbonate  800 mg of elemental calcium Oral QHS  . collagenase   Topical Daily  . enoxaparin (LOVENOX) injection  30 mg Subcutaneous Q24H  . feeding supplement (GLUCERNA SHAKE)  237 mL Oral BID BM  . furosemide  40 mg Oral Daily  . hydrALAZINE  25 mg Oral 3 times per day  . insulin aspart  0-9 Units Subcutaneous TID WC  . insulin glargine  20 Units Subcutaneous QHS  . isosorbide dinitrate  40 mg Oral BID  . levothyroxine  150 mcg Oral QAC breakfast  . metoprolol  100 mg Oral BID  . multivitamin with minerals  1 tablet Oral Daily  . mycophenolate  720 mg Oral BID  . pantoprazole  40 mg Oral Daily  . piperacillin-tazobactam (ZOSYN)  IV  3.375 g Intravenous 3 times per day  . sirolimus  1 mg Oral q morning - 10a   Continuous Infusions: . sodium chloride 12.5  mL/hr at 05/01/13 0849   PRN Meds:.acetaminophen, diphenhydrAMINE, oxyCODONE  Assessment/Plan: 63 year old male with a PMH of HTN, DM type 2, CAD (s/p MI and LAD stent), kidney transplantation, carotid artery stenosis (s/p CEA 08/2011), PVD (s/p fem-pop in 1980), dHF (EF 45-50% Jan 2015), hx of papillary thyroid cancer and prostate cancer who presents with complaint of edema x 3 days and dyspnea for several weeks.   Volume overload:  Was likely 2/2 worsening CHF in the setting of CKD about 13ys s/p renal transplant.  CXR with small bilateral pleural effusions.  Right heart cath on 02/20 revealed normal PCWP and normal CO.  Dry weight appears to be 180 pounds. Received 80 of Lasix po yesterday.  Net negative 362cc yesterday, weight unchanged at 187 pounds.  He is distended with lower extremity edema today.    - 80mg  Lasix po today, then decrease to 60mg  po tomorrow and at discharge if renal function stable - continue Lopressor 100mg  BID, hydralazine 25mg  q8h; isordil 40mg  BID - daily weights and I&Os - Plans for d/c to [facility] as soon as bed is available  B/L pleural effusions:  Antibiotics were started on 02/20 and his  dyspnea has improved.  Thoracentesis completed 02/22 and yeilded 780cc of yellow fluid.  Analysis consistent with transudate likely 2/2 to heart failure and hypoalbuminemia.  Pleural fluid culture negative. - oxygen supplementation prn - incentive spirometer  - albuterol nebs q6h  HCAP:  Fevers, leukocytosis and possible LLL infiltrate on CXR in an immunosuppressed patient.  No recent fevers and no leukocytosis.  Final blood cultures - no growth.  Antibiotic treatment completed. - oxygen supplementation prn - albuterol nebs prn q6h   SVT:  Episodes of sustained SVT to the 140s with chest discomfort.  CP resolved once he was back in NSR.  EP to see regarding SVT and possibility of ablation. - monitor on telemetry - continue metoprolol  - continue amiodarone 200mg  BID; plan decrease to 200mg  daily in 2 weeks; low dose was started because there is potential for interaction with sirolimus.   - IV lopressor prn  - will not adjust Synthroid yet as amio may make him hyper or more hypo.    Chest pain:  Resolved.  Likely 2/2 to SVT.    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 Baseline Cr 1.9-2.5. Creatine 2.24 --> 3.45.  Had been stable around 2.8 but increased above 3 in past few days.  Possibly 2/2 to vancomycin.  Will monitor BMP.   - Continue anti-rejection meds  Acute on chronic anemia: Stable.  Likely anemia of chronic disease.  - consider addition of EPO   Hypocalcemia: 6.3 on admission and he received calcium gluconate.  Repeat on 12/15 was 6.1 w/ albumin 1.6. Corrected Ca was 8.0. However ionized Calcium low at 0.71-->0.79. Replacing Calcium.  DM type 2:  Previously on 24 units Lantus at home, but wife reported hypoglycemic episodes, so she was administering 12 units over the past few days prior to admission.  BG 98-234 in past 24 hours.  - continue Lantus 20 units qHS  - SSI sensitive  HTN:  Stable.  Continued Lopressor, hydralazine, lasix,  isordil.  H/o prostate cancer with T10 lesion noted on prior MRI: Question of spinal mets at last hospitalization, reportedly seen on MRI, but negative bone scan 02/2013. Patient seen by Dr. Diona [name] with Urology during prior hospitalization.  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  His back pain is being controlled with oxycodone and acetaminophen. - Continue Oxycodone IR 10mg  q 4h PRN and Tylenol  Unstageable pressure ulcer, sacral:  Patient aware of ulcer prior to admission.  Wound care consulted and recommendations appreciated. - Twice daily - cleanse with normal saline and pat dry, debridement with Santyl, moist NS dressing (pack into wound), secure with dry 4x4 and tape - pressure redistribution pad   Severe malnutrition in the context of chronic illness:  Continue Glucerna shakes, multivitamin.  DVT ppx: SCDs and lovenox  Dispo: Disposition is deferred at this time, awaiting improvement of current medical problems.  Anticipated discharge in approximately 1 day(s).  Likely d/c to [facility] tomorrow pending bed availability.  The patient does have a current PCP (Sueanne Margarita, MD) and may need an [facility] hospital follow-up appointment after discharge.  The patient does not know have transportation limitations that hinder transportation to clinic appointments.  .Services Needed at time of discharge: Y = Yes, Blank = No PT:   OT:   RN:   Equipment:   Other:     LOS: 14 days   Duwaine Maxin, DO 05/05/2013, 2:48 PM

## 2013-05-05 NOTE — Discharge Summary (Deleted)
Patient Name:  Joshua [name]  MRN: [id number]  PCP: Sueanne Margarita, MD  DOB:  October 21, 1950       Date of Admission:  04/21/2013  Date of Discharge:  05/09/13     Attending Physician: Dr. Dominic Pea, DO         DISCHARGE DIAGNOSES: 1.   Acute on chronic systolic heart failure 2.   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   Healthcare-associated pneumonia 4.   Chest pain secondary to supraventricular tachycardia 5.   Acute on chronic anemia 6.   Hypocalcemia 7.   Hypertension 8.   Hypothyroidism 9.   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   Severe malnutrition in the context of chronic illness 11.   Controlled diabetes mellitus type 2 with complications 12.   Unstageable pressure ulcer, sacral   DISPOSITION AND FOLLOW-UP: Joshua [name] is to follow-up with the listed providers as detailed below, at patient's visiting, please address following issues:  1) He will need a paracentesis (diagnostic and therapeutic) at [facility]. Please check daily BMP and CBC. 2) He will need to follow-up with nephrology. They are arranging for outpatient dialysis for when he is ready to leave [facility]. 3) Please do Amiodarone dose reduction from 200mg  BID to 200mg  daily two weeks after starting (on or about 05/13/13). 4) Patient has hypothyroidism and TSH will need to be monitored periodically while on amiodarone.  5) He will need to follow-up with Dr. Dr. Diona Fanti regarding probable spinal metastasis.       Future Appointments Provider Department Dept Phone   05/15/2013 1:45 PM Rebecca Eaton, MD [place] [phone]   06/06/2013 10:15 AM Blanchie Serve, MD [facility] [phone]   06/12/2013 8:30 AM [facility] 3 Echo Echo 2 [facility] CARDIOVASCULAR IMAGING ECHO [address] [phone]   07/12/2013 8:10 AM [facility] [place] [phone]   11/01/2013 2:00 PM [facility] [place] CARDIOVASCULAR  IMAGING [address] [phone]   11/01/2013 3:00 PM Sharmon Leyden Nickel, NP [facility] [place] [phone]       DISCHARGE MEDICATIONS:   Medication List    ASK your doctor about these medications       aspirin 81 MG chewable tablet  Chew 81 mg by mouth every morning.     atorvastatin 80 MG tablet  Commonly known as:  LIPITOR  TAKE 1 TABLET BY MOUTH EVERY DAY     CALCIUM 1200 1200-1000 MG-UNIT Chew  Chew 1 tablet by mouth daily.     diltiazem 120 MG 24 hr capsule  Commonly known as:  CARDIZEM CD  Take 120 mg by mouth every morning.     insulin lispro 100 UNIT/ML injection  Commonly known as:  HUMALOG  Inject 6 Units into the skin 3 (three) times daily as needed (for sugars over 280).     isosorbide mononitrate 30 MG 24 hr tablet  Commonly known as:  IMDUR  Take 30 mg by mouth every morning.     LANTUS 100 UNIT/ML injection  Generic drug:  insulin glargine  Inject 24 Units into the skin at bedtime.     levothyroxine 150 MCG tablet  Commonly known as:  SYNTHROID, LEVOTHROID  Take 150 mcg by mouth daily before breakfast.     methocarbamol 500 MG tablet  Commonly known as:  ROBAXIN  Take 500 mg by mouth 2 (two) times daily.     metoprolol  50 MG tablet  Commonly known as:  LOPRESSOR  Take 100 mg by mouth 2 (two) times daily.     mycophenolate 180 MG EC tablet  Commonly known as:  MYFORTIC  Take 720 mg by mouth 2 (two) times daily.     Oxycodone HCl 10 MG Tabs  Take 10 mg by mouth every 3 (three) hours as needed for severe pain (give with one percocet).     pantoprazole 40 MG tablet  Commonly known as:  PROTONIX  Take 40 mg by mouth daily.     sirolimus 2 MG tablet  Commonly known as:  RAPAMUNE  Take 2 mg by mouth daily.     sodium bicarbonate 650 MG tablet  Take 650 mg by mouth 2 (two) times daily.     Vitamin D (Ergocalciferol) 50000 UNITS Caps capsule  Commonly known as:  DRISDOL  Take 50,000 Units by mouth every Thursday.          CONSULTS:  Cardiology Electrophysiology Nephrology Pharmacy Wound Care  PROCEDURES PERFORMED:  Dg Chest 1 View  04/29/2013   CLINICAL DATA:  Thoracentesis  EXAM: CHEST - 1 VIEW  COMPARISON:  DG CHEST 2 VIEW dated 04/27/2013  FINDINGS: The heart remains moderately enlarged. Left pleural effusion resolve after the thoracentesis. No ensuing pneumothorax. Overall, vascular congestion and edema have also improved. Right pleural effusion has probably also improved.  IMPRESSION: No pneumothorax post left thoracentesis.   Electronically Signed   By: Maryclare Bean M.D.   On: 04/29/2013 11:27   Dg Chest 2 View  04/27/2013   CLINICAL DATA:  Shortness of breath, dyspnea, weakness  EXAM: CHEST  2 VIEW  COMPARISON:  DG CHEST 1V PORT dated 04/27/2013; DG CHEST 2 VIEW dated 04/24/2013; DG CHEST 2 VIEW dated 04/22/2013  FINDINGS: Grossly unchanged enlarged cardiac silhouette and mediastinal contours. The pulmonary vasculature is less distinct on the present examination with cephalization of flow. Worsening perihilar and bilateral medial basilar heterogeneous/consolidative opacities, left greater than right. Interval development of small bilateral pleural effusions. No pneumothorax. Grossly unchanged bones. Multiple surgical clips overlie the midline of the lower neck. A vascular stent overlies the inner aspect of the left upper arm. Surgical clips overlie the bilateral axilla.  IMPRESSION: Findings most suggestive of worsening pulmonary edema, small bilateral effusions and perihilar/medial basilar opacities, left greater than right, atelectasis versus infiltrate.   Electronically Signed   By: Sandi Mariscal M.D.   On: 04/27/2013 11:29   Dg Chest 2 View  04/24/2013   CLINICAL DATA:  Dyspnea  EXAM: CHEST  2 VIEW  COMPARISON:  Prior radiograph from 04/22/2013  FINDINGS: Cardiomegaly is stable as compared to the prior exam.  Lungs are mildly hypoinflated. There is diffuse pulmonary vascular congestion with indistinctness of  the interstitial markings, compatible with pulmonary edema, worsened. Small bilateral pleural effusions are present. More confluent bilateral perihilar opacities likely reflect alveolar edema. No definite focal infiltrates identified. No pneumothorax.  Surgical clips overlie the left axilla and lower neck. No acute osseous abnormality.  IMPRESSION: Cardiomegaly with small bilateral pleural effusions and moderate diffuse pulmonary edema, worsened as compared to 04/22/13.   Electronically Signed   By: Jeannine Boga M.D.   On: 04/24/2013 03:12   Dg Chest 2 View  04/22/2013   CLINICAL DATA:  Shortness of breath and weakness  EXAM: CHEST  2 VIEW  COMPARISON:  04/21/2013  FINDINGS: Clips project over the neck from presumed thyroidectomy. Interstitial edema, trace effusions, and curvilinear bilateral lower lobe presumed  atelectasis are again noted. There has been interval increase in hazy predominantly dependent bilateral lower lobe airspace opacity. Cardiomegaly persists. Left axillary clips are noted.  IMPRESSION: Slight increase in bibasilar opacities likely progressive alveolar superimposed on interstitial pulmonary edema.   Electronically Signed   By: Conchita Paris M.D.   On: 04/22/2013 16:32   Dg Chest 2 View  04/21/2013   CLINICAL DATA:  Leg swelling, history CHF, hypertension, diabetes, stroke  EXAM: CHEST  2 VIEW  COMPARISON:  04/19/2013  FINDINGS: Enlargement of cardiac silhouette with pulmonary vascular congestion.  Diffuse pulmonary infiltrates compatible pulmonary edema and CHF.  Scattered areas of bibasilar atelectasis.  More focal opacity or left lung base may represent atelectasis or increased edema but coexistent infection not excluded.  Small bibasilar effusions.  No pneumothorax.  Vascular stents in left upper arm.  Retained contrast in colon.  IMPRESSION: Diffuse pulmonary infiltrates likely representing pulmonary edema/ CHF with scattered areas of atelectasis and small bilateral pleural  effusions as above.   Electronically Signed   By: Lavonia Dana M.D.   On: 04/21/2013 14:40   Dg Chest Port 1 View  04/27/2013   CLINICAL DATA:  Increasing shortness of breath question fluid overload  EXAM: PORTABLE CHEST - 1 VIEW  COMPARISON:  Portable exam [id number] hr compared to 04/24/2013  FINDINGS: Enlargement of cardiac silhouette with pulmonary vascular congestion.  Diffuse interstitial infiltrates consistent with pulmonary edema.  Additional atelectasis versus consolidation left lower lobe.  No pleural effusion or pneumothorax.  IMPRESSION: CHF with atelectasis versus consolidation in left lower lobe.   Electronically Signed   By: Lavonia Dana M.D.   On: 04/27/2013 09:14   Dg Fluoro Guide Cv Line-no Report  04/28/2013   CLINICAL DATA: Gordy Councilman placement   FLOURO GUIDE CV LINE  Fluoroscopy was utilized by the requesting physician.  No radiographic  interpretation.    Ir Kypho Vertebral Thoracic Augmentation  04/26/2013   CLINICAL DATA:  Prostate carcinoma. Multiple axial lesions on thoracic and lumbar MRI. Painful compression fracture deformity of T12.  EXAM: 1.  DEEP BONE BIOPSY THORACIC T12 UNDER FLUOROSCOPY  2.  KYPHOPLASTY AT THORACIC T12  TECHNIQUE: The procedure, risks (including but not limited to bleeding, infection, organ damage), benefits, and alternatives were explained to the patient. Questions regarding the procedure were encouraged and answered. The patient understands and consents to the procedure.  The patient was placed prone on the fluoroscopic table. The skin overlying the lower thoracic region was then prepped and draped in the usual sterile fashion. Maximal barrier sterile technique was utilized including caps, mask, sterile gowns, sterile gloves, sterile drape, hand hygiene and skin antiseptic.  Intravenous Fentanyl and Versed were administered as conscious sedation during continuous cardiorespiratory monitoring by the radiology RN, with a total moderate sedation time of40 minutes.   As antibiotic prophylaxis, !cefazolin 1 gram! was ordered pre-procedure and administered intravenously within !one hour! of incision.  The thoracic compression deformity of T12 was localized.The right pedicle at T12 was then infiltrated with 1% lidocaine followed by the advancement of a Kyphon trocar needle through the right pedicle into the posterior one-third. The trocar was removed and a coaxial core bone biopsy was obtained. Subsequently, The osteo drill was advanced to the anterior third of the vertebral body. The osteo drill was retracted. Through the working cannula, a Kyphon inflatable bone tamp 15 x 3 was advanced and positioned with the distal marker 5 mm from the anterior aspect of the cortex. Crossing of the midline  was not seen on the AP projection. At this time, the balloon was expanded using contrast via a Kyphon inflation syringe device via micro tubing.  In similar fashion, the left pedicle was infiltrated with 1% lidocaine followed by the advancement of a second Kyphon trocar needle through the left pedicle into the posterior third of the vertebral body. Coaxial core deep bone biopsy sample obtained. The osteo drill was coaxially advanced to the anterior right third. The osteo drill was exchanged for a Kyphon inflatable bone tamp 15x 3, advanced to the 5 mm of the anterior aspect of the cortex. The balloon was then expanded using contrast as above.  Inflations were continued until there was near apposition across the midline and approaching the superior and the inferior end plates.  At this time, methylmethacrylate mixture was reconstituted in the Kyphon bone mixing device system. This was then loaded into the delivery mechanism, attached to Kyphon bone fillers.  The balloons were deflated and removed followed by the instillation of methylmethacrylate mixture with excellent filling in the AP and lateral projections. No extravasation was noted in the disk spaces or posteriorly into the spinal canal.  No epidural venous contamination was seen.  The patient tolerated the procedure well. There were no acute complications. The working cannulae and the bone filler were then retrieved and removed.  IMPRESSION: 1. Deep bone core biopsy of thoracic T12 lesion. 2. Status post vertebral body augmentation using balloon kyphoplasty at thoracic T12 as described without event.   Electronically Signed   By: Arne Cleveland M.D.   On: 04/26/2013 18:14   US Thoracentesis Asp Pleural Space W/img Guide  04/29/2013   CLINICAL DATA:  Patient with history of dyspnea, CHF, bilateral pleural effusions, coronary artery disease, chronic kidney disease, prostate cancer ; request is made for left thoracentesis.  EXAM: ULTRASOUND GUIDED DIAGNOSTIC AND THERAPEUTIC LEFT THORACENTESIS  COMPARISON:  None.  FINDINGS: A total of approximately 780 cc's of yellow fluid was removed. The fluid sample was sent for laboratory analysis.  IMPRESSION: Successful ultrasound guided diagnostic/therapeutic left thoracentesis yielding 780 cc's of pleural fluid.  Read by: Rowe Robert ,P.A.-C.  PROCEDURE: An ultrasound guided thoracentesis was thoroughly discussed with the patient and questions answered. The benefits, risks, alternatives and complications were also discussed. The patient understands and wishes to proceed with the procedure. Written consent was obtained.  Ultrasound was performed to localize and mark an adequate pocket of fluid in the left chest. The area was then prepped and draped in the normal sterile fashion. 1% Lidocaine was used for local anesthesia. Under ultrasound guidance a 19 gauge Yueh catheter was introduced. Thoracentesis was performed. The catheter was removed and a dressing applied.  Complications:  none   Electronically Signed   By: Maryclare Bean M.D.   On: 04/29/2013 11:11     CT Abdomen Pelvis Wo Contrast 05/09/13 Pending   ADMISSION DATA: H&P: Mr. Decoteau is a 63 year old male with a PMH of HTN, DM type 2, CAD (s/p MI  and LAD stent), CKD s/p kidney transplantation, carotid artery stenosis (s/p CEA 08/2011), dHF (EF 45-50% Jan 2015), PVD (s/p fem-pop in 1980), hx of papillary thyroid cancer and prostate cancer (with possible mets to spine) who presents with complaint of edema and dyspnea. He says has felt swollen all over for the past 3 days. He is on 2L oxygen at home and reports increasing dyspnea since his recent hospitalization (2 weeks ago). His wife says he sometimes needs to sleep sitting up.  He denies PND, CP, palpitations, leg pain or blood loss. He reports compliance with medications.   Physical Exam: Blood pressure 149/77, pulse 101, temperature 98.3 F (36.8 C), temperature source Oral, resp. rate 22, SpO2 100.00%.  General: resting in bed in NAD  HEENT: PERRL, EOMI  Cardiac: RRR, no rubs, murmurs or gallops  Pulm: clear to auscultation bilaterally, moving normal volumes of air  Abd: soft, nontender, nondistended, BS present  Ext: warm and well perfused, 1+ B/L lower extremity edema  Neuro: alert and oriented X3, cranial nerves II-XII grossly intact, 5/5 MMS   Labs: Basic Metabolic Panel:   Recent Labs   04/19/13 0025  04/21/13 1330   NA  140  138   K  4.2  4.5   CL  92*  93*   CO2  25  24   GLUCOSE  82  132*   BUN  44*  45*   CREATININE  2.48*  2.24*   CALCIUM  6.3*  6.1*    Liver Function Tests:   Recent Labs   04/19/13 0025  04/21/13 1330   AST  42*  38*   ALT  16  16   ALKPHOS  191*  163*   BILITOT  0.4  0.4   PROT  7.4  7.0   ALBUMIN  2.0*  1.8*    CBC:   Recent Labs   04/19/13 0025  04/21/13 1330   WBC  13.3*  13.3*   NEUTROABS  10.7*  10.8*   HGB  8.0*  7.1*   HCT  26.1*  22.9*   MCV  74.6*  74.4*   PLT  370  364    Cardiac Enzymes:   Recent Labs   04/21/13 1330   TROPONINI  <0.30    BNP:   Recent Labs   04/21/13 1330   PROBNP  29444.0*    HOSPITAL COURSE: Acute on chronic systolic heart failure:  Mr. Shangraw presented with symptoms of volume  overload secondary to worsening heart failure in the setting of chronic kidney disease, 13 years s/p renal transplant.  He initially had some improvement with IV Lasix but after about 1 week was diuresing poorly, BUN/creatinine began to rise and his dyspnea and edema returned.  ECHO revealed worsening function compared to 1 month prior.   Chest xray revealed worsening pulmonary edema, small bilateral pleural effusions and basilar opacities (left greater than right).  Given his history of CAD, worsening EF and pulmonary edema on chest xray there was concern for ischemia mediated heart failure.  Cardiology was consulted and felt that ischemia was likely not the cause of the patient's symptoms and that volume overload was the major problem.  Unfortunately, the patient was not a candidate for catheterization given his renal failure and history of GI bleed.  Given concern for cardiorenal syndrome, cardiology was asked to assess volume status and cardiac output.  Right heart catheterization was performed which revealed normal PCWP and normal CO.  His dry weight was estimated to be around 180 pounds.  Oral Lasix was resumed at that time in an effort to keep the patient euvolemic.  Diagnostic thoracentesis was also performed and pleural fluid analysis was consistent with a transudate, likely secondary to heart failure and hypoalbuminemia.  The patient was continued on oral Lasix during his second week of admission, however his urine output decreased and he began to gain weight.  Nephrology recommended a trial of hemodialysis which the patient agreed to. He tolerated this  well and will resume a Monday Wednesday Friday dialysis schedule going forward.  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  His baseline creatinine was 1.8-2.0 recently.  This admission his creatine went from 2.24 to 3.91.  Anti-rejection medications, mycophenolate  and sirolimus were continued during admission.  Sirolimus dose was decreased from 2mg  daily to 1mg  daily due to supra-therapeutic level.  Given his worsening renal function and decreased urine output nephrology recommended resuming hemodialysis. There is a small chance his transplanted kidney will regain function, but most likely his antirejection medications will need to be tapered over the next 6-12 months. Nephrology can manage this as an outpatient.  Healthcare associated pneumonia:  His white blood cell count began to increase during admission and he developed a fever 1 week into his stay.  Given his dyspnea, fever, leukocytosis, immunosuppressed state and possible left lower lobe infiltrate on chest xray antibiotics were started for healthcare associated pneumonia.  Antibiotics were continued for 7 days and he remained afebrile with resolved leukocytosis.    Chest pain secondary to supraventricular tachycardia:  The patient experienced a few episodes of sustained SVT to the 140s with chest discomfort.  His chest pain resolved once he was back in sinus rhythm.  His troponins were negative and there were no EKG changes.  He was monitored on telemetry and metoprolol was continued.  Amiodarone was started at a lower dose, 200mg  twice daily because of its potential to interfere with sirolimus.  The patient was not felt to be a candidate for ablation.  Amiodarone should be decreased to 200mg  daily two weeks after starting (~ 05/13/13).  We were unable to make any changes to Synthroid dose because TSH is not as helpful in the setting of acute illness, however his Synthroid dose may need to be adjusted in the future while he is taking amiodarone.    Acute on chronic anemia:  The patient's baseline hemoglobin is 10-11.  He presented with hemoglobin 7.1.  Hbg ranged 6.8-8.5 and he received a total of 2 units of packed red blood cells during admission.  There was no evidence of bleeding and hemolysis work-up was  negative.  One month ago, Ferritin level was 5064 with low Fe and TIBC consistent with anemia of chronic disease.  On the day prior to discharge he got 2 units of blood in dialysis with a more than appropriate response in his hemoglobin to 13.9. Please repeat CBC and treat only to a goal hemoglobin of 10-11.  Hypocalcemia:  The patient's calcium was 6.1 on admission, likely due renal failure.  With albumin 1.8, corrected calcium was 7.9.  Ionized calcium was also low, 0.71.  He was supplemented with both IV and po calcium as needed with resolution as below.  Calcium  Date Value Ref Range Status  05/09/2013 8.7  8.4 - 10.5 mg/dL Final  05/08/2013 7.8* 8.4 - 10.5 mg/dL Final  05/07/2013 7.5* 8.4 - 10.5 mg/dL Final  05/06/2013 7.0* 8.4 - 10.5 mg/dL Final  05/04/2013 6.5* 8.4 - 10.5 mg/dL Final    Diabetes mellitus type 2:  The patient was previously on 24 units of Lantus and 6 units of Novolog TID (for CBG > 280) at home, but his wife reported hypoglycemic episodes, so she was administering 12 units over the past few days prior to admission.  We started with Lantus 15 units qHS and SSI.  Lantus was gradually increased  to 20 units qHS as the patients po intake and blood glucose increased. After we started dialysis, this was halved to 10 units each bedtime due to an episode of hypoglycemia.   Hypertension:  Lopressor and Lasix were continued during admission.  Isosorbide mononitrate was switched to isosorbide dinitrate and hydralazine was added for heart failure benefits, however the hydralazine was ultimately discontinued due to hypotension.  Diltiazem was stopped as it is not generally recommended in heart failure.  Metoprolol was decreased from 100 twice a day to 25 twice a day during his trial dialysis due to soft pressures. This was increased to 50 mg twice a day at discharge due to sinus tachycardia.  History of prostate cancer with T10 lesion noted on prior imaging:  The patient had radiation treatment  for prostate cancer about 5 years ago.  He has had a several month history of low back pain and lesions were reportedly seen on MRI (possibly at outside institution as there is no MRI in [facility]).  However, he had a negative bone scan in in 02/2013.  The patient follows with Urologist, Dr. Diona Fanti and was to have have T10 biopsy and kyphoplasty as an outpatient on 02/20.  Since the patient was hospitalized, this procedure was completed while admitted on 02/18.  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  Given the necrosis, Pathology could not perform stains to better identify the cells.  His back pain was controlled with oxycodone and acetaminophen. His urologist called himand his wife while he was here, and suggested androgen deprivation with Mills Koller shots which were started 3/2. Side effects were discussed with the patient and his wife.  Unstageable pressure ulcer, sacral: The patient was aware of ulcer prior to admission.  Wound care consulted during admission and recommended twice daily dressing changes and chemical debridement with Santyl.  The patient was also provided with a pressure redistribution pad.  Severe malnutrition in the context of chronic illness:   The patient's appetite improved somewhat throughout admission.  His diet was supplemented with Glucerna shakes and multivitamin.  DISCHARGE DATA: Vital Signs: BP 135/71  Pulse 127  Temp(Src) 98.7 F (37.1 C) (Oral)  Resp 20  Ht 5\' 11"  (1.803 m)  Wt 175 lb 11.3 oz (79.7 kg)  BMI 24.52 kg/m2  SpO2 100%  Labs: Results for orders placed during the hospital encounter of 04/21/13 (from the past 24 hour(s))  GLUCOSE, CAPILLARY     Status: None   Collection Time    05/08/13  8:11 PM      Result Value Ref Range   Glucose-Capillary 74  70 - 99 mg/dL   Comment 1 Documented in Chart     Comment 2 Notify RN    GLUCOSE, CAPILLARY     Status: None   Collection Time    05/08/13 10:42 PM       Result Value Ref Range   Glucose-Capillary 93  70 - 99 mg/dL   Comment 1 Documented in Chart     Comment 2 Notify RN    GLUCOSE, CAPILLARY     Status: Abnormal   Collection Time    05/09/13  6:40 AM      Result Value Ref Range   Glucose-Capillary 39 (*) 70 - 99 mg/dL   Comment 1 Documented in Chart     Comment 2 Notify RN    COMPREHENSIVE METABOLIC PANEL     Status: Abnormal   Collection Time  05/09/13  6:55 AM      Result Value Ref Range   Sodium 141  137 - 147 mEq/L   Potassium 4.9  3.7 - 5.3 mEq/L   Chloride 91 (*) 96 - 112 mEq/L   CO2 26  19 - 32 mEq/L   Glucose, Bld 31 (*) 70 - 99 mg/dL   BUN 29 (*) 6 - 23 mg/dL   Creatinine, Ser 2.53 (*) 0.50 - 1.35 mg/dL   Calcium 8.7  8.4 - 10.5 mg/dL   Total Protein 7.6  6.0 - 8.3 g/dL   Albumin 2.1 (*) 3.5 - 5.2 g/dL   AST 137 (*) 0 - 37 U/L   ALT 38  0 - 53 U/L   Alkaline Phosphatase 586 (*) 39 - 117 U/L   Total Bilirubin 0.7  0.3 - 1.2 mg/dL   GFR calc non Af Amer 26 (*) >90 mL/min   GFR calc Af Amer 30 (*) >90 mL/min  CBC     Status: Abnormal   Collection Time    05/09/13  6:55 AM      Result Value Ref Range   WBC 11.2 (*) 4.0 - 10.5 K/uL   RBC 5.33  4.22 - 5.81 MIL/uL   Hemoglobin 13.9  13.0 - 17.0 g/dL   HCT 42.5  39.0 - 52.0 %   MCV 79.7  78.0 - 100.0 fL   MCH 26.1  26.0 - 34.0 pg   MCHC 32.7  30.0 - 36.0 g/dL   RDW 20.9 (*) 11.5 - 15.5 %   Platelets    150 - 400 K/uL   Value: PLATELET CLUMPS NOTED ON SMEAR, COUNT APPEARS DECREASED  GLUCOSE, CAPILLARY     Status: Abnormal   Collection Time    05/09/13  6:56 AM      Result Value Ref Range   Glucose-Capillary 51 (*) 70 - 99 mg/dL   Comment 1 Documented in Chart     Comment 2 Notify RN    GLUCOSE, CAPILLARY     Status: Abnormal   Collection Time    05/09/13  7:20 AM      Result Value Ref Range   Glucose-Capillary 49 (*) 70 - 99 mg/dL   Comment 1 Documented in Chart     Comment 2 Notify RN    GLUCOSE, CAPILLARY     Status: Abnormal   Collection Time     05/09/13  7:59 AM      Result Value Ref Range   Glucose-Capillary 102 (*) 70 - 99 mg/dL  GLUCOSE, CAPILLARY     Status: Abnormal   Collection Time    05/09/13 11:28 AM      Result Value Ref Range   Glucose-Capillary 116 (*) 70 - 99 mg/dL     Services Ordered on Discharge: Y = Yes; Blank = No PT:   OT:   RN:   Equipment:   Other:      Time Spent on Discharge: 35 min   Signed: Lesly Dukes PGY 1, Internal Medicine Resident 05/09/2013, 2:20 PM

## 2013-05-05 NOTE — Progress Notes (Signed)
RN did pt dressing change. Cleansed the wound with NS and patted dry before applying Santyl to the wound topically and packing the wound with a wet 2x2. RN placed a mepilex on top of the 2x2. Pt tolerated dressing change well. Davis, Jaunita Mikels R, RN  

## 2013-05-05 NOTE — Progress Notes (Signed)
Hypoglycemic Event  CBG: 62  Treatment: 15 GM gel  Symptoms: None  Follow-up CBG: Time:0700 CBG Result:66  Possible Reasons for Event: Unknown     Mar Daring R  Remember to initiate Hypoglycemia Order Set & complete

## 2013-05-05 NOTE — Progress Notes (Signed)
Internal Medicine Attending  Date: 05/05/2013  Patient name: Joshua [name] Medical record number: [id number] Date of birth: 12-05-1950 Age: 63 y.o. Gender: male  I saw and evaluated the patient. I reviewed the resident's note by Dr. Redmond Pulling and I agree with the resident's findings and plans as documented in her progress note.  Mr. Winkleman is unchanged this AM.  He is actually net fluids down over the last 24 hours, but his weight has remained stable at 187 (about 5-7 pounds above dry weight).  Agree with another dose of oral lasix 80 mg today and we will reassess BMP in the AM and decide upon a stable discharge dose.  We continue to wait on an open bed in the [facility] and are hopeful one may open up this weekend.

## 2013-05-05 NOTE — Progress Notes (Signed)
Hypoglycemic Event  CBG: 60  Treatment: 15 GM carbohydrate snack  Symptoms: None  Follow-up CBG: [id number] CBG Result:62  Possible Reasons for Event: Unknown    Mar Daring R  Remember to initiate Hypoglycemia Order Set & complete

## 2013-05-05 NOTE — Progress Notes (Signed)
PT Cancellation Note  Patient Details Name: Joshua [name] MRN: [id number] DOB: April 25, 1950   Cancelled Treatment:    Reason Eval/Treat Not Completed: Patient declined, no reason specified.  Patient reports he is too fatigued and would rather have PT in am.  Will return tomorrow.   Despina Pole 05/05/2013, 7:08 PM Carita Pian. Sanjuana Kava, [place] Pager [phone]

## 2013-05-06 LAB — BASIC METABOLIC PANEL
BUN: 82 mg/dL — ABNORMAL HIGH (ref 6–23)
CALCIUM: 7 mg/dL — AB (ref 8.4–10.5)
CHLORIDE: 87 meq/L — AB (ref 96–112)
CO2: 26 meq/L (ref 19–32)
CREATININE: 3.91 mg/dL — AB (ref 0.50–1.35)
GFR calc Af Amer: 18 mL/min — ABNORMAL LOW (ref >90)
GFR calc non Af Amer: 15 mL/min — ABNORMAL LOW (ref >90)
GLUCOSE: 157 mg/dL — AB (ref 70–99)
Potassium: 4.4 mEq/L (ref 3.7–5.3)
Sodium: 135 mEq/L — ABNORMAL LOW (ref 137–147)

## 2013-05-06 LAB — GLUCOSE, CAPILLARY
GLUCOSE-CAPILLARY: 130 mg/dL — AB (ref 70–99)
GLUCOSE-CAPILLARY: 177 mg/dL — AB (ref 70–99)
GLUCOSE-CAPILLARY: 166 mg/dL — AB (ref 70–99)
Glucose-Capillary: 145 mg/dL — ABNORMAL HIGH (ref 70–99)
Glucose-Capillary: 95 mg/dL (ref 70–99)

## 2013-05-06 MED ORDER — LIDOCAINE HCL (PF) 1 % IJ SOLN: 5.0000 mL | INTRAMUSCULAR | Status: DC | PRN

## 2013-05-06 MED ORDER — NEPRO/CARBSTEADY PO LIQD
237.0000 mL | ORAL | Status: DC | PRN
  Filled 2013-05-06: qty 237

## 2013-05-06 MED ORDER — ALTEPLASE 2 MG IJ SOLR
2.0000 mg | Freq: Once | INTRAMUSCULAR | Status: AC | PRN
Start: 2013-05-06 — End: 2013-05-06
  Filled 2013-05-06: qty 2

## 2013-05-06 MED ORDER — SODIUM CHLORIDE 0.9 % IV SOLN: 100.0000 mL | INTRAVENOUS | Status: DC | PRN

## 2013-05-06 MED ORDER — PENTAFLUOROPROP-TETRAFLUOROETH EX AERO: 1.0000 "application " | INHALATION_SPRAY | CUTANEOUS | Status: DC | PRN

## 2013-05-06 MED ORDER — LIDOCAINE-PRILOCAINE 2.5-2.5 % EX CREA
1.0000 "application " | TOPICAL_CREAM | CUTANEOUS | Status: DC | PRN
  Filled 2013-05-06: qty 5

## 2013-05-06 MED ORDER — HEPARIN SODIUM (PORCINE) 1000 UNIT/ML DIALYSIS
1000.0000 [IU] | INTRAMUSCULAR | Status: DC | PRN
  Filled 2013-05-06: qty 1

## 2013-05-06 MED ORDER — HEPARIN SODIUM (PORCINE) 1000 UNIT/ML DIALYSIS
20.0000 [IU]/kg | Freq: Once | INTRAMUSCULAR | Status: DC
  Filled 2013-05-06: qty 2

## 2013-05-06 NOTE — Progress Notes (Addendum)
Tele show run  Wide complex Tach, [place] per monitor patientt in transit to hemodialysis at that time. 61 spoke with HD RN, paged MD to notify. Dr. Algis Liming made aware. Joylene Draft A

## 2013-05-06 NOTE — Progress Notes (Signed)
Subjective: He is still agreeable to LTAC once a bed is available. Denies chest pain, abdominal pain, N/V/D. He had a BM last nigh with no hematochezia or dark stools. He still has a cough but this is improving.   Objective: Vital signs in last 24 hours: Filed Vitals:   05/06/13 0238 05/06/13 0458 05/06/13 0727 05/06/13 1036  BP:  98/66  122/57  Pulse:  96  75  Temp:  98 F (36.7 C)    TempSrc:  Oral    Resp:  18    Height:      Weight:  188 lb 11.4 oz (85.6 kg)    SpO2: 98% 99% 95%    Weight change: 1 lb 1.6 oz (0.5 kg)  Intake/Output Summary (Last 24 hours) at 05/06/13 1124 Last data filed at 05/06/13 0730  Gross per 24 hour  Intake    480 ml  Output    752 ml  Net   -272 ml   General: Resting in bed in NAD, occasional twitching of his left shoulder/arm Cardiac: RRR, no rubs, murmur or gallops  Pulm: faint expiratory wheezing Abd: soft, abdominal distention with no tenderness to palpation, no guarding, BS present Ext: warm and well perfused, 1+ pitting edema bilaterally up to his knees Neuro: alert and oriented X3, follows commands appropriately, moves all 4 extremities voluntairly  Lab Results: Basic Metabolic Panel:  Recent Labs Lab 05/01/13 1908  05/03/13 0315 05/04/13 0244 05/06/13 0333  NA 138  < > 135* 136* 135*  K 4.0  < > 5.1 4.5 4.4  CL 90*  < > 86* 89* 87*  CO2 27  < > 23 24 26   GLUCOSE 92  < > 228* 162* 157*  BUN 66*  < > 70* 75* 82*  CREATININE 3.02*  < > 3.13* 3.45* 3.91*  CALCIUM 6.5*  < > 6.6* 6.5* 7.0*  MG 2.1  --   --   --   --   PHOS  --   < > 5.9* 6.0*  --   < > = values in this interval not displayed. Liver Function Tests:  Recent Labs Lab 05/03/13 0315 05/04/13 0244  ALBUMIN 1.5* 1.5*   CBC:  Recent Labs Lab 05/02/13 0259 05/03/13 0315  WBC 9.5 10.3  NEUTROABS  --  8.2*  HGB 7.0* 7.4*  HCT 21.6* 23.7*  MCV 76.6* 76.5*  PLT 154 172   Cardiac Enzymes:  Recent Labs Lab 05/01/13 2101 05/02/13 0259 05/02/13 0902    TROPONINI <0.30 <0.30 <0.30   CBG:  Recent Labs Lab 05/05/13 0732 05/05/13 1109 05/05/13 1610 05/05/13 2000 05/06/13 0605 05/06/13 0831  GLUCAP 87 108* 116* 145* 130* 177*   Thyroid Function Tests:  Recent Labs Lab 04/29/13 1540  TSH 37.669*   Anemia Panel:  Recent Labs Lab 05/01/13 1245  RETICCTPCT 0.5   Micro Results: Recent Results (from the past 240 hour(s))  CULTURE, BLOOD (ROUTINE X 2)     Status: None   Collection Time    04/28/13 11:40 AM      Result Value Ref Range Status   Specimen Description BLOOD RIGHT HAND   Final   Special Requests BOTTLES DRAWN AEROBIC ONLY 2CC   Final   Culture  Setup Time     Final   Value: 04/28/2013 16:51     Performed at [facility]   Culture     Final   Value: NO GROWTH 5 DAYS     Performed at [facility]   Report Status 05/04/2013 FINAL   Final  CULTURE, BLOOD (ROUTINE X 2)     Status: None   Collection Time    04/28/13  8:55 PM      Result Value Ref Range Status   Specimen Description BLOOD RIGHT HAND   Final   Special Requests BOTTLES DRAWN AEROBIC AND ANAEROBIC 10CC EACH   Final   Culture  Setup Time     Final   Value: 04/29/2013 03:36     Performed at [facility]   Culture     Final   Value: NO GROWTH 5 DAYS     Performed at [facility]   Report Status 05/05/2013 FINAL   Final  BODY FLUID CULTURE     Status: None   Collection Time    04/29/13 10:54 AM      Result Value Ref Range Status   Specimen Description FLUID LEFT PLEURAL   Final   Special Requests Immunocompromised   Final   Gram Stain     Final   Value: WBC PRESENT,BOTH PMN AND MONONUCLEAR     NO ORGANISMS SEEN     CYTOSPIN     Performed at [facility]   Culture     Final   Value: NO GROWTH 3 DAYS     Performed at [facility]   Report Status 05/03/2013 FINAL   Final   Studies/Results: No results found. Medications: I have reviewed the patient's current medications. Scheduled Meds: .  albuterol  2.5 mg Nebulization Q6H  . amiodarone  200 mg Oral BID  . antiseptic oral rinse  15 mL Mouth Rinse BID  . aspirin EC  81 mg Oral Daily  . atorvastatin  80 mg Oral q1800  . calcium acetate  2,001 mg Oral TID WC  . calcium carbonate  800 mg of elemental calcium Oral QHS  . collagenase   Topical Daily  . enoxaparin (LOVENOX) injection  30 mg Subcutaneous Q24H  . feeding supplement (GLUCERNA SHAKE)  237 mL Oral BID BM  . furosemide  40 mg Oral Daily  . hydrALAZINE  25 mg Oral 3 times per day  . insulin aspart  0-9 Units Subcutaneous TID WC  . insulin glargine  20 Units Subcutaneous QHS  . isosorbide dinitrate  40 mg Oral BID  . levothyroxine  150 mcg Oral QAC breakfast  . metoprolol  100 mg Oral BID  . multivitamin with minerals  1 tablet Oral Daily  . mycophenolate  720 mg Oral BID  . pantoprazole  40 mg Oral Daily  . sirolimus  1 mg Oral q morning - 10a   Continuous Infusions: . sodium chloride 12.5 mL/hr at 05/01/13 0849   PRN Meds:.acetaminophen, diphenhydrAMINE, oxyCODONE Assessment/Plan: 63 year old male with a PMH of HTN, DM2, CAD (s/p MI and LAD stent), kidney transplantation, carotid artery stenosis (s/p CEA 08/2011), PVD (s/p fem-pop in 1980), dHF (EF 45-50% Jan 2015), hx of papillary thyroid cancer and prostate cancer who presents with complaint of edema x 3 days and dyspnea for several weeks.   Volume overload: Was likely 2/2 worsening CHF in the setting of CKD about 13ys s/p renal transplant. CXR on presentation with small bilateral pleural effusions. Right heart cath on 02/20 revealed normal PCWP and normal CO. Dry weight appears to be 180 pounds. Received 40mg  BID po Lasix yesterday but weight actually up to 188, Cr trending up, and urine output down, he is now oliguric.  His O2 saturation is stable at 95-98% on 3L. His abdomen remains distended with 1+ pitting edema bilaterally.  - Appreciate Nephrology's recommendations given his volume overload but worsening  renal function.  - continue Lopressor 100mg  BID, hydralazine 25mg  q8h; isordil 40mg  BID  - daily weights and I&Os  - Plans for d/c to [facility] as soon as bed is available   B/L pleural effusions: Antibiotics were started on 02/20 and his dyspnea has improved. Thoracentesis completed 02/22 and yielded 780cc of yellow fluid. Analysis consistent with transudate likely 2/2 to heart failure and hypoalbuminemia. Pleural fluid culture negative.  - oxygen supplementation prn  - incentive spirometer  - albuterol nebs q6h   HCAP: Fevers, leukocytosis and possible LLL infiltrate on CXR in an immunosuppressed patient. No recent fevers and no leukocytosis. Final blood cultures - no growth. Antibiotic treatment completed.  - oxygen supplementation prn  - albuterol nebs prn q6h   SVT: Episodes of sustained SVT to the 140s with chest discomfort earlier this week. CP resolved once he was back in NSR. EP has seen him regarding SVT and determined that a this time he is a poor candidate for ablation.  - monitor on telemetry  - continue metoprolol  - continue amiodarone 200mg  BID; plan decrease to 200mg  daily in 2 weeks; low dose was started because there is potential for interaction with sirolimus.  - IV lopressor prn  - will not adjust Synthroid yet as amio may make him hyper or more hypo.   Chest pain: Resolved. Likely 2/2 to SVT.  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 Baseline Cr 1.9-2.5. Creatine 2.24 --> 3.45-->3.91. Had been stable around 2.8 but has increased above 3 in past few days. Possibly 2/2 to vancomycin. Will monitor BMP.  - Continue anti-rejection meds  - Appreciate Nephrology recommendations  Acute on chronic anemia: Stable. Likely anemia of chronic disease.  - consider addition of EPO   Hypocalcemia: 6.3 on admission and he received calcium gluconate. Repeat on 12/15 was 6.1 w/ albumin 1.6. Corrected Ca was 8.0. However ionized Calcium low at  0.71-->0.79. Replacing Calcium.   DM type 2: Previously on 24 units Lantus at home, but wife reported hypoglycemic episodes, so she was administering 12 units over the past few days prior to admission. BG 98-234 in past 24 hours.  - continue Lantus 20 units qHS  - SSI sensitive   HTN: Stable. Continued Lopressor, hydralazine, lasix, isordil.   H/o prostate cancer with T10 lesion noted on prior MRI: Question of spinal mets at last hospitalization, reportedly seen on MRI, but negative bone scan 02/2013. Patient seen by Dr. Diona Fanti with Urology during prior hospitalization. 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 His back pain is being controlled with oxycodone and acetaminophen.  - Continue Oxycodone IR 10mg  q 4h PRN and Tylenol   Unstageable pressure ulcer, sacral: Patient aware of ulcer prior to admission. Wound care consulted and recommendations appreciated.  - Twice daily - cleanse with normal saline and pat dry, debridement with Santyl, moist NS dressing (pack into wound), secure with dry 4x4 and tape  - pressure redistribution pad   Severe malnutrition in the context of chronic illness: Continue Glucerna shakes, multivitamin.   DVT ppx: SCDs and lovenox   Dispo: Disposition is deferred at this time, awaiting improvement of current medical problems.  Anticipated discharge in approximately  day(s).   The  patient does have a current PCP (Sueanne Margarita, MD) and does not need an [facility] hospital follow-up appointment after discharge.  The patient does not have transportation limitations that hinder transportation to clinic appointments.  .Services Needed at time of discharge: Y = Yes, Blank = No PT:   OT:   RN:   Equipment:   Other:     LOS: 15 days   Blain Pais, MD 05/06/2013, 11:24 AM

## 2013-05-06 NOTE — Progress Notes (Signed)
Internal Medicine Attending  Date: 05/06/2013  Patient name: Joshua [name] Medical record number: [id number] Date of birth: 1951-02-15 Age: 63 y.o. Gender: male  I saw and evaluated the patient. I reviewed the resident's note by Dr. Hayes Ludwig and I agree with the resident's findings and plans as documented in her progress note.  [name] remains weak, but denies any SOB or pain.  Creatinine and weight trending upwards despite oral lasix.  Have asked Renal to join back in patient's care to advise [place] on management of renal issues.  Continuing to wait for LTAC bed in [facility] to come available.

## 2013-05-06 NOTE — Progress Notes (Signed)
[facility] Progress Note   Subjective: Asked to see patient again with rising B/Creat and not diuresing on po lasix 40/day.   Other meds: amio, asa, lipitor, phoslo, tums, lovenox, hydralazine 25 tid, lantus, isordil 40 bid, synthroid, metoprolol 100 bid, Myfortic 720 bid, protonix, Rapamune 1mg  /day  Pt has been in hospital two times for two weeks and it not getting better, multiple problems including edema, severe fatigue, malnutrition, jerking/tremors, poor appetite w nausea, and intermittent confusion. Creat is up now to 3.91 w BUN 82.  Baseline creat is around 2.5.  Wt on this admit was 85 kg, went down to 81kg and is back up to 85kg today.  I/O last 7d = +1.6L.  Chart Review 2002 Renal transplant [facility] 2011  Nstemi, LAD BM stent, DM, was on Rapamune 4/d and Myfortic then 08/2011  R CEA 1/12-1/28/15 > Acute on CKD or transplant, HCAP w dense bilat PNA on CT, atrial tach, GIB w neg Ba enema, stopped antiplts at d/c 2/13-current > A/C sdCHF, HTN, pros CA w t12 lesion on MRI and back pain > biopsy showed "ghost cells" suspicious for malignancy (KP also done on 2/18), on amio for parox atrial tach, RHC done with normal PCW of 12, normal cardiac index 2.7-4L, ICM EF 30%, DM2, A/C renal failure of Tx    Filed Vitals:   05/06/13 0458 05/06/13 0727 05/06/13 1036 05/06/13 1459  BP: 98/66  122/57   Pulse: 96  75   Temp: 98 F (36.7 C)     TempSrc: Oral     Resp: 18     Height:      Weight: 85.6 kg (188 lb 11.4 oz)     SpO2: 99% 95%  96%   Exam: Very weak but alert and Ox3 +JVD Faint bibasilar rales RRR soft 2/6 SEM no rub or gallop Abd nontender with prominent ascites 3+ 2+ diffuse pitting edema of LE's LUA AVF patent w strong bruit Neuro- marked asterixis and tremors, LUE weak  CXR- last cxr 2/21 bilat CHF, improving but still present ECHO > LV 30-35%, RV not seen  Assessment: 1 Acute on chronic renal failure of transplant kidney 2 FTT progressive fatigue,  deconditioning, malnutrition 3 Confusion (intermittent) / myoclonus, persistent- rapamune level last admit was normal, do not see any meds that would be causing this 3 Volume overload / anasarca / ascites 4 NICM EF 30% 5 Back pain / T12 lesion biopsy- suggestive of malignancy 6 Paroxsymal atrial tach- on amio, MTP 7 HTN- on hydralazine, MTP   Plan- It is quite possible that uremia is causing most of these problems. Malnutrition can cause BUN and creat to overestimate GFR. Plan trial of HD to see if he improves. Have d/w pt and his wife who agree to proceed. BP's soft , will d/c hydralazine, may need to reduce MTP as well if BP won't accomodate volume removal.     Kelly Splinter MD  pager [phone]    cell [phone]  05/06/2013, 3:08 PM     Recent Labs Lab 05/02/13 0259 05/03/13 0315 05/04/13 0244 05/06/13 0333  NA 138 135* 136* 135*  K 4.2 5.1 4.5 4.4  CL 90* 86* 89* 87*  CO2 28 23 24 26   GLUCOSE 193* 228* 162* 157*  BUN 69* 70* 75* 82*  CREATININE 3.15* 3.13* 3.45* 3.91*  CALCIUM 6.3* 6.6* 6.5* 7.0*  PHOS 5.5* 5.9* 6.0*  --     Recent Labs Lab 05/02/13 0259 05/03/13 0315 05/04/13 0244  ALBUMIN  1.5* 1.5* 1.5*    Recent Labs Lab 05/01/13 0420 05/02/13 0259 05/03/13 0315  WBC 10.6* 9.5 10.3  NEUTROABS  --   --  8.2*  HGB 7.0* 7.0* 7.4*  HCT 22.1* 21.6* 23.7*  MCV 75.4* 76.6* 76.5*  PLT 176 154 172   . albuterol  2.5 mg Nebulization Q6H  . amiodarone  200 mg Oral BID  . antiseptic oral rinse  15 mL Mouth Rinse BID  . aspirin EC  81 mg Oral Daily  . atorvastatin  80 mg Oral q1800  . calcium acetate  2,001 mg Oral TID WC  . calcium carbonate  800 mg of elemental calcium Oral QHS  . collagenase   Topical Daily  . enoxaparin (LOVENOX) injection  30 mg Subcutaneous Q24H  . feeding supplement (GLUCERNA SHAKE)  237 mL Oral BID BM  . furosemide  40 mg Oral Daily  . hydrALAZINE  25 mg Oral 3 times per day  . insulin aspart  0-9 Units Subcutaneous TID WC  .  insulin glargine  20 Units Subcutaneous QHS  . isosorbide dinitrate  40 mg Oral BID  . levothyroxine  150 mcg Oral QAC breakfast  . metoprolol  100 mg Oral BID  . multivitamin with minerals  1 tablet Oral Daily  . mycophenolate  720 mg Oral BID  . pantoprazole  40 mg Oral Daily  . sirolimus  1 mg Oral q morning - 10a   . sodium chloride 12.5 mL/hr at 05/01/13 0849   acetaminophen, diphenhydrAMINE, oxyCODONE

## 2013-05-06 NOTE — Progress Notes (Addendum)
RN did pt dressing change. Cleansed the wound with NS and patted dry before applying Santyl to the wound topically and packing the wound with a wet 2x2. RN placed 4 x 4 and mepilex on top of the 2x2. Pt tolerated dressing change well. Will continue to monitor, call bell within reach and wife at bedside.

## 2013-05-06 NOTE — Progress Notes (Signed)
PT Cancellation Note  Patient Details Name: Joshua [name] MRN: [id number] DOB: [birth year]   Cancelled Treatment:    Reason Eval/Treat Not Completed: Medical issues which prohibited therapy.  Attempted to see patient x2 - with MD and respiratory.  Patient's wife reports patient to go to HD shortly.  Will return on Monday to check on patient.   Despina Pole 05/06/2013, 3:05 PM Carita Pian. Sanjuana Kava, [place] Pager [phone]

## 2013-05-07 LAB — COMPREHENSIVE METABOLIC PANEL
ALT: 17 U/L (ref 0–53)
AST: 43 U/L — ABNORMAL HIGH (ref 0–37)
Albumin: 1.6 g/dL — ABNORMAL LOW (ref 3.5–5.2)
Alkaline Phosphatase: 442 U/L — ABNORMAL HIGH (ref 39–117)
BUN: 57 mg/dL — ABNORMAL HIGH (ref 6–23)
CHLORIDE: 91 meq/L — AB (ref 96–112)
CO2: 26 mEq/L (ref 19–32)
Calcium: 7.5 mg/dL — ABNORMAL LOW (ref 8.4–10.5)
Creatinine, Ser: 3.16 mg/dL — ABNORMAL HIGH (ref 0.50–1.35)
GFR calc Af Amer: 23 mL/min — ABNORMAL LOW (ref >90)
GFR, EST NON AFRICAN AMERICAN: 20 mL/min — AB (ref >90)
Glucose, Bld: 111 mg/dL — ABNORMAL HIGH (ref 70–99)
Potassium: 4.1 mEq/L (ref 3.7–5.3)
Sodium: 139 mEq/L (ref 137–147)
Total Bilirubin: 0.4 mg/dL (ref 0.3–1.2)
Total Protein: 6.3 g/dL (ref 6.0–8.3)

## 2013-05-07 LAB — GLUCOSE, CAPILLARY
Glucose-Capillary: 107 mg/dL — ABNORMAL HIGH (ref 70–99)
Glucose-Capillary: 117 mg/dL — ABNORMAL HIGH (ref 70–99)
Glucose-Capillary: 215 mg/dL — ABNORMAL HIGH (ref 70–99)

## 2013-05-07 LAB — HEPATITIS B SURFACE ANTIGEN: HEP B S AG: NEGATIVE

## 2013-05-07 LAB — CBC
HEMATOCRIT: 22.6 % — AB (ref 39.0–52.0)
Hemoglobin: 7.1 g/dL — ABNORMAL LOW (ref 13.0–17.0)
MCH: 24.1 pg — ABNORMAL LOW (ref 26.0–34.0)
MCHC: 31.4 g/dL (ref 30.0–36.0)
MCV: 76.9 fL — AB (ref 78.0–100.0)
Platelets: 135 10*3/uL — ABNORMAL LOW (ref 150–400)
RBC: 2.94 MIL/uL — ABNORMAL LOW (ref 4.22–5.81)
RDW: 22.2 % — ABNORMAL HIGH (ref 11.5–15.5)
WBC: 10 10*3/uL (ref 4.0–10.5)

## 2013-05-07 LAB — HEPATITIS B CORE ANTIBODY, TOTAL: Hep B Core Total Ab: NONREACTIVE

## 2013-05-07 LAB — TROPONIN I: Troponin I: <0.3 ng/mL (ref <0.30)

## 2013-05-07 LAB — HEPATITIS B SURFACE ANTIBODY,QUALITATIVE: Hep B S Ab: NEGATIVE

## 2013-05-07 MED ORDER — PENTAFLUOROPROP-TETRAFLUOROETH EX AERO: 1.0000 "application " | INHALATION_SPRAY | CUTANEOUS | Status: DC | PRN

## 2013-05-07 MED ORDER — METOPROLOL TARTRATE 50 MG PO TABS
50.0000 mg | ORAL_TABLET | Freq: Two times a day (BID) | ORAL | Status: DC
  Filled 2013-05-07 (×2): qty 1

## 2013-05-07 MED ORDER — SODIUM CHLORIDE 0.9 % IV SOLN: 100.0000 mL | INTRAVENOUS | Status: DC | PRN

## 2013-05-07 MED ORDER — CALAMINE EX LOTN
TOPICAL_LOTION | CUTANEOUS | Status: DC | PRN
  Filled 2013-05-07: qty 118

## 2013-05-07 MED ORDER — NEPRO/CARBSTEADY PO LIQD: 237.0000 mL | ORAL | Status: DC | PRN

## 2013-05-07 MED ORDER — LIDOCAINE HCL (PF) 1 % IJ SOLN: 5.0000 mL | INTRAMUSCULAR | Status: DC | PRN

## 2013-05-07 MED ORDER — ALBUMIN HUMAN 25 % IV SOLN
25.0000 g | Freq: Once | INTRAVENOUS | Status: AC
  Administered 2013-05-07: 25 g via INTRAVENOUS

## 2013-05-07 MED ORDER — LIDOCAINE-PRILOCAINE 2.5-2.5 % EX CREA: 1.0000 "application " | TOPICAL_CREAM | CUTANEOUS | Status: DC | PRN

## 2013-05-07 MED ORDER — ALBUMIN HUMAN 25 % IV SOLN
INTRAVENOUS | Status: AC
  Filled 2013-05-07: qty 50

## 2013-05-07 MED ORDER — ISOSORBIDE DINITRATE 20 MG PO TABS
20.0000 mg | ORAL_TABLET | Freq: Two times a day (BID) | ORAL | Status: DC
  Administered 2013-05-07 – 2013-05-13 (×11): 20 mg via ORAL
  Filled 2013-05-07 (×16): qty 1

## 2013-05-07 MED ORDER — HEPARIN SODIUM (PORCINE) 1000 UNIT/ML DIALYSIS: 1000.0000 [IU] | INTRAMUSCULAR | Status: DC | PRN

## 2013-05-07 MED ORDER — HEPARIN SODIUM (PORCINE) 1000 UNIT/ML DIALYSIS: 20.0000 [IU]/kg | Freq: Once | INTRAMUSCULAR | Status: DC

## 2013-05-07 MED ORDER — METOPROLOL TARTRATE 25 MG PO TABS
25.0000 mg | ORAL_TABLET | Freq: Two times a day (BID) | ORAL | Status: DC
  Administered 2013-05-08: 25 mg via ORAL
  Filled 2013-05-07 (×5): qty 1

## 2013-05-07 MED ORDER — ALTEPLASE 2 MG IJ SOLR: 2.0000 mg | Freq: Once | INTRAMUSCULAR | Status: AC | PRN

## 2013-05-07 NOTE — Progress Notes (Signed)
[facility] Progress Note   Subjective: Had HD yesterday and is on HD again today. No new complaints, no real change overnight per pt, "as long as you're trying to help me I'm doing fine"  Other meds: amio, asa, lipitor, phoslo, tums, lovenox, hydralazine 25 tid, lantus, isordil 40 bid, synthroid, metoprolol 100 bid, Myfortic 720 bid, protonix, Rapamune 1mg  /day  This is second trip to hospital for two weeks and problems not getting better, multiple issues including refractory edema, severe fatigue, malnutrition, jerking/tremors, poor appetite w nausea, and intermittent confusion. Creat is up now to 3.91 w BUN 82.  Baseline creat is around 2.5.  Wt on this admit was 85 kg, went down to 81kg and is back up to 85kg today.  I/O last 7d = +1.6L.  Chart Review >2002 Renal transplant [facility] >2011  Nstemi, LAD BM stent, DM, was on Rapamune 4/d and Myfortic then >08/2011  R CEA >1/12-1/28/15  Acute on CKD or transplant, HCAP w dense bilat PNA on CT, atrial tach, GIB w neg Ba enema, stopped antiplts at d/c >2/13-current -- acute on chronic sdCHF, HTN, pros CA w t12 lesion on MRI and back pain > biopsy showed "ghost cells" suspicious for malignancy (KP also done 2/18), amio for parox atrial tach, RHC done with normal PCW of 12, normal cardiac index 2.7-4L, ICM EF 30%, DM2, A/C renal failure of Tx    Filed Vitals:   05/07/13 1230 05/07/13 1245 05/07/13 1300 05/07/13 1318  BP: 113/51 123/60 115/48 139/66  Pulse: 77 73 80 74  Temp:    97.7 F (36.5 C)  TempSrc:    Oral  Resp: 11 18 12 12   Height:      Weight:    81.4 kg (179 lb 7.3 oz)  SpO2:    100%   Exam: Very weak but alert and Ox3 +JVD Faint bibasilar rales RRR soft 2/6 SEM no rub or gallop Abd nontender with prominent ascites 3+ 2+ diffuse pitting edema of LE's LUA AVF patent w strong bruit Neuro- marked asterixis and tremors, LUE weak  CXR- last cxr 2/21 bilat CHF, improving but still present ECHO > LV 30-35%, RV not  seen  Assessment: 1 Acute on chronic renal failure of transplant kidney 2 FTT progressive fatigue, deconditioning, malnutrition 3 Confusion (intermittent) / myoclonus, persistent- rapamune level last admit was normal, MPA level last admit was in range; do not see any meds that would be causing this 3 Volume overload / anasarca / ascites 4 NICM EF 30% 5 Back pain / T12 lesion biopsy- suggestive of malignancy 6 Paroxsymal atrial tach- on amio, MTP 7 HTN- on hydralazine, MTP   Plan- trial of HD underway due to multiple problems which fit uremia profile; HD again today and tomorrow. Lowered MTP (metoprolol) to allow more vol removal with HD.     Kelly Splinter MD  pager [phone]    cell [phone]  05/07/2013, 1:38 PM     Recent Labs Lab 05/02/13 0259 05/03/13 0315 05/04/13 0244 05/06/13 0333 05/07/13 0339  NA 138 135* 136* 135* 139  K 4.2 5.1 4.5 4.4 4.1  CL 90* 86* 89* 87* 91*  CO2 28 23 24 26 26   GLUCOSE 193* 228* 162* 157* 111*  BUN 69* 70* 75* 82* 57*  CREATININE 3.15* 3.13* 3.45* 3.91* 3.16*  CALCIUM 6.3* 6.6* 6.5* 7.0* 7.5*  PHOS 5.5* 5.9* 6.0*  --   --     Recent Labs Lab 05/03/13 0315 05/04/13 0244 05/07/13 4403  AST  --   --  43*  ALT  --   --  17  ALKPHOS  --   --  442*  BILITOT  --   --  0.4  PROT  --   --  6.3  ALBUMIN 1.5* 1.5* 1.6*    Recent Labs Lab 05/02/13 0259 05/03/13 0315 05/07/13 0339  WBC 9.5 10.3 10.0  NEUTROABS  --  8.2*  --   HGB 7.0* 7.4* 7.1*  HCT 21.6* 23.7* 22.6*  MCV 76.6* 76.5* 76.9*  PLT 154 172 135*   . albuterol  2.5 mg Nebulization Q6H  . amiodarone  200 mg Oral BID  . antiseptic oral rinse  15 mL Mouth Rinse BID  . aspirin EC  81 mg Oral Daily  . atorvastatin  80 mg Oral q1800  . calcium acetate  2,001 mg Oral TID WC  . calcium carbonate  800 mg of elemental calcium Oral QHS  . collagenase   Topical Daily  . enoxaparin (LOVENOX) injection  30 mg Subcutaneous Q24H  . feeding supplement (GLUCERNA SHAKE)  237 mL Oral  BID BM  . heparin  20 Units/kg Dialysis Once in dialysis  . insulin aspart  0-9 Units Subcutaneous TID WC  . insulin glargine  20 Units Subcutaneous QHS  . isosorbide dinitrate  20 mg Oral BID  . levothyroxine  150 mcg Oral QAC breakfast  . metoprolol  50 mg Oral BID  . multivitamin with minerals  1 tablet Oral Daily  . mycophenolate  720 mg Oral BID  . pantoprazole  40 mg Oral Daily  . sirolimus  1 mg Oral q morning - 10a   . sodium chloride 12.5 mL/hr at 05/01/13 0849   sodium chloride, sodium chloride, acetaminophen, diphenhydrAMINE, feeding supplement (NEPRO CARB STEADY), heparin, lidocaine (PF), lidocaine-prilocaine, oxyCODONE, pentafluoroprop-tetrafluoroeth

## 2013-05-07 NOTE — Progress Notes (Signed)
Internal Medicine Attending  Date: 05/07/2013  Patient name: Joshua [name] Medical record number: [id number] Date of birth: Nov 09, 1950 Age: 63 y.o. Gender: male  I saw and evaluated the patient. I reviewed the resident's note by Dr. Lucila Maine and I agree with the resident's findings and plans as documented in her progress note.  Mr. Torti was seen in dialysis this AM and was tired but w/o any new complaints.  With yesterday's run volume (3 pounds) and urea (BUN now 52) were removed.  Appreciate Nephrology's help.  We are continuing current supportive care awaiting for [facility] to continue to work on rehabilitation/strengthening.

## 2013-05-07 NOTE — Progress Notes (Addendum)
Subjective: He tolerated dialysis yesterday but feels worn out. Denies chest pain, abdominal pain, N/V/D, SOB. He asks [place] to pray for him. He is still amenable to an LTAC bed when one is ready.  Objective: Vital signs in last 24 hours: Filed Vitals:   05/06/13 2131 05/07/13 0207 05/07/13 0500 05/07/13 0611  BP:    112/75  Pulse: 75 80  68  Temp:    98.4 F (36.9 C)  TempSrc:    Oral  Resp: 18 18  18   Height:      Weight:   184 lb 8.4 oz (83.7 kg)   SpO2: 95%   98%   Weight change: -3 lb 1.4 oz (-1.4 kg)  Intake/Output Summary (Last 24 hours) at 05/07/13 0714 Last data filed at 05/06/13 1905  Gross per 24 hour  Intake   1080 ml  Output   3752 ml  Net  -2672 ml   General: Resting in bed in NAD, occasional twitching of his bilateral shoulder/arm Cardiac: RRR, no rubs, murmur or gallops  Pulm: faint expiratory wheezing Abd: soft, mild abdominal distention with no tenderness to palpation, no guarding, BS present Ext: warm and well perfused, 1+ pitting edema bilaterally to ankles Neuro: alert and oriented X3, follows commands appropriately, moves all 4 extremities voluntairly  Lab Results: Basic Metabolic Panel:  Recent Labs Lab 05/01/13 1908  05/03/13 0315 05/04/13 0244 05/06/13 0333 05/07/13 0339  NA 138  < > 135* 136* 135* 139  K 4.0  < > 5.1 4.5 4.4 4.1  CL 90*  < > 86* 89* 87* 91*  CO2 27  < > 23 24 26 26   GLUCOSE 92  < > 228* 162* 157* 111*  BUN 66*  < > 70* 75* 82* 57*  CREATININE 3.02*  < > 3.13* 3.45* 3.91* 3.16*  CALCIUM 6.5*  < > 6.6* 6.5* 7.0* 7.5*  MG 2.1  --   --   --   --   --   PHOS  --   < > 5.9* 6.0*  --   --   < > = values in this interval not displayed. Liver Function Tests:  Recent Labs Lab 05/04/13 0244 05/07/13 0339  AST  --  43*  ALT  --  17  ALKPHOS  --  442*  BILITOT  --  0.4  PROT  --  6.3  ALBUMIN 1.5* 1.6*   CBC:  Recent Labs Lab 05/03/13 0315 05/07/13 0339  WBC 10.3 10.0  NEUTROABS 8.2*  --   HGB 7.4* 7.1*  HCT  23.7* 22.6*  MCV 76.5* 76.9*  PLT 172 135*   Cardiac Enzymes:  Recent Labs Lab 05/01/13 2101 05/02/13 0259 05/02/13 0902  TROPONINI <0.30 <0.30 <0.30   CBG:  Recent Labs Lab 05/05/13 2000 05/06/13 0605 05/06/13 0831 05/06/13 1147 05/06/13 2013 05/07/13 0554  GLUCAP 145* 130* 177* 166* 95 107*   Thyroid Function Tests: No results found for this basename: TSH, T4TOTAL, FREET4, T3FREE, THYROIDAB,  in the last 168 hours Anemia Panel:  Recent Labs Lab 05/01/13 1245  RETICCTPCT 0.5   Micro Results: Recent Results (from the past 240 hour(s))  CULTURE, BLOOD (ROUTINE X 2)     Status: None   Collection Time    04/28/13 11:40 AM      Result Value Ref Range Status   Specimen Description BLOOD RIGHT HAND   Final   Special Requests BOTTLES DRAWN AEROBIC ONLY [place]   Final   Culture  Setup  Time     Final   Value: 04/28/2013 16:51     Performed at [facility]   Culture     Final   Value: NO GROWTH 5 DAYS     Performed at [facility]   Report Status 05/04/2013 FINAL   Final  CULTURE, BLOOD (ROUTINE X 2)     Status: None   Collection Time    04/28/13  8:55 PM      Result Value Ref Range Status   Specimen Description BLOOD RIGHT HAND   Final   Special Requests BOTTLES DRAWN AEROBIC AND ANAEROBIC 10CC EACH   Final   Culture  Setup Time     Final   Value: 04/29/2013 03:36     Performed at [facility]   Culture     Final   Value: NO GROWTH 5 DAYS     Performed at [facility]   Report Status 05/05/2013 FINAL   Final  BODY FLUID CULTURE     Status: None   Collection Time    04/29/13 10:54 AM      Result Value Ref Range Status   Specimen Description FLUID LEFT PLEURAL   Final   Special Requests Immunocompromised   Final   Gram Stain     Final   Value: WBC PRESENT,BOTH PMN AND MONONUCLEAR     NO ORGANISMS SEEN     CYTOSPIN     Performed at [facility]   Culture     Final   Value: NO GROWTH 3 DAYS     Performed at [facility]   Report Status 05/03/2013 FINAL   Final   Studies/Results: No results found. Medications: I have reviewed the patient's current medications. Scheduled Meds: . albuterol  2.5 mg Nebulization Q6H  . amiodarone  200 mg Oral BID  . antiseptic oral rinse  15 mL Mouth Rinse BID  . aspirin EC  81 mg Oral Daily  . atorvastatin  80 mg Oral q1800  . calcium acetate  2,001 mg Oral TID WC  . calcium carbonate  800 mg of elemental calcium Oral QHS  . collagenase   Topical Daily  . enoxaparin (LOVENOX) injection  30 mg Subcutaneous Q24H  . feeding supplement (GLUCERNA SHAKE)  237 mL Oral BID BM  . heparin  20 Units/kg Dialysis Once in dialysis  . insulin aspart  0-9 Units Subcutaneous TID WC  . insulin glargine  20 Units Subcutaneous QHS  . isosorbide dinitrate  40 mg Oral BID  . levothyroxine  150 mcg Oral QAC breakfast  . metoprolol  100 mg Oral BID  . multivitamin with minerals  1 tablet Oral Daily  . mycophenolate  720 mg Oral BID  . pantoprazole  40 mg Oral Daily  . sirolimus  1 mg Oral q morning - 10a   Continuous Infusions: . sodium chloride 12.5 mL/hr at 05/01/13 0849   PRN Meds:.sodium chloride, sodium chloride, acetaminophen, diphenhydrAMINE, feeding supplement (NEPRO CARB STEADY), heparin, lidocaine (PF), lidocaine-prilocaine, oxyCODONE, pentafluoroprop-tetrafluoroeth Assessment/Plan: 63 year old male with a PMH of HTN, DM2, CAD (s/p MI and LAD stent), kidney transplantation, carotid artery stenosis (s/p CEA 08/2011), PVD (s/p fem-pop in 1980), dHF (EF 45-50% Jan 2015), hx of papillary thyroid cancer and prostate cancer who presents with complaint of edema x 3 days and dyspnea for several weeks.   Volume overload: Was likely 2/2 worsening CHF in the setting of CKD about 13ys s/p renal transplant. CXR on  presentation with small bilateral pleural effusions. Right heart cath on 02/20 revealed normal PCWP and normal CO. Dry weight appears to be 180 pounds. He underwent HD  yesterday, weight decreased from 188lb > 184 lbs. His O2 saturation is stable at 98% on 3L. His abdominal distention and edema appear to be improved from prior caregivers' exams. - Appreciate Nephrology's recommendations - Continue Lopressor 100mg  BID, isordil 40mg  BID  - Stop hydralazine 25mg  q8h 2/2 soft pressures - Daily weights and I&Os > -2.7L yesterday - Plans for d/c to [facility] as soon as bed is available   B/L pleural effusions: Antibiotics were started on 02/20 and his dyspnea has improved. Thoracentesis completed 02/22 and yielded 780cc of yellow fluid. Analysis consistent with transudate likely 2/2 to heart failure and hypoalbuminemia. Pleural fluid culture negative.  - Oxygen supplementation prn  - incentive spirometer  - albuterol nebs q6h   HCAP: Fevers, leukocytosis and possible LLL infiltrate on CXR in an immunosuppressed patient. No recent fevers and no leukocytosis. Final blood cultures - no growth. Antibiotic treatment completed.  - oxygen supplementation prn  - albuterol nebs prn q6h   SVT: Episodes of sustained SVT to the 140s with chest discomfort earlier this week. CP resolved once he was back in NSR. EP has seen him regarding SVT and determined that a this time he is a poor candidate for ablation.  - monitor on telemetry, he had a brief episode of wide complex VT on the way to dialysis last night, which was asymptomatic - continue metoprolol  - continue amiodarone 200mg  BID; plan decrease to 200mg  daily in 2 weeks (~05/13/13); low dose was started because there is potential for interaction with sirolimus - IV lopressor prn  - will not adjust Synthroid yet as amio may make him hyper or more hypo.   Chest pain: Resolved. Likely 2/2 to SVT.  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 Baseline Cr 1.9-2.5. Had been stable around 2.8 but has increased above to nearly 4 in past few days. Renal was re-consulted yesterday who decided to do  a trial of HD. - Continue anti-rejection meds  - Appreciate Nephrology recommendations - Continue HD per renal  Creatinine, Ser  Date Value Ref Range Status  05/07/2013 3.16* 0.50 - 1.35 mg/dL Final  05/06/2013 3.91* 0.50 - 1.35 mg/dL Final  05/04/2013 3.45* 0.50 - 1.35 mg/dL Final  05/03/2013 3.13* 0.50 - 1.35 mg/dL Final  05/02/2013 3.15* 0.50 - 1.35 mg/dL Final    Acute on chronic anemia: Stable. Likely anemia of chronic disease.  - consider addition of EPO   Hemoglobin  Date Value Ref Range Status  05/07/2013 7.1* 13.0 - 17.0 g/dL Final  05/03/2013 7.4* 13.0 - 17.0 g/dL Final  05/02/2013 7.0* 13.0 - 17.0 g/dL Final  05/01/2013 7.0* 13.0 - 17.0 g/dL Final  04/30/2013 7.7* 13.0 - 17.0 g/dL Final    Hypocalcemia: 6.3 on admission and he received calcium gluconate. Repeat on 12/15 was 6.1 w/ albumin 1.6. Corrected Ca was 8.0. However ionized Calcium low at 0.71-->0.79. Likely cause of his upper extremity myoclonus on exam. - Replacing Calcium  Calcium  Date Value Ref Range Status  05/07/2013 7.5* 8.4 - 10.5 mg/dL Final  05/06/2013 7.0* 8.4 - 10.5 mg/dL Final  05/04/2013 6.5* 8.4 - 10.5 mg/dL Final  05/03/2013 6.6* 8.4 - 10.5 mg/dL Final  05/02/2013 6.3* 8.4 - 10.5 mg/dL Final     CRITICAL RESULT CALLED TO, READ BACK BY AND  VERIFIED WITH:     SHELTON H,RN 05/02/13 0354 WAYK    DM type 2: Previously on 24 units Lantus at home, but wife reported hypoglycemic episodes, so she was administering 12 units over the past few days prior to admission. BG 98-234 in past 24 hours.  - continue Lantus 20 units qHS  - SSI sensitive   HTN: Stable.  - Continued Lopressor, isordil - D/c hydralazine, lasix.  H/o prostate cancer with T10 lesion noted on prior MRI: Question of spinal mets at last hospitalization, reportedly seen on MRI, but negative bone scan 02/2013. Patient seen by Dr. Diona Fanti with Urology during prior hospitalization. 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 His back pain is being controlled with oxycodone and acetaminophen.  - Continue Oxycodone IR 10mg  q 4h PRN and Tylenol   Unstageable pressure ulcer, sacral: Patient aware of ulcer prior to admission. Wound care consulted and recommendations appreciated.  - Twice daily - cleanse with normal saline and pat dry, debridement with Santyl, moist NS dressing (pack into wound), secure with dry 4x4 and tape  - pressure redistribution pad   Severe malnutrition in the context of chronic illness: Continue Glucerna shakes, multivitamin.   DVT ppx: SCDs and lovenox   Dispo: Disposition is deferred at this time, awaiting improvement of current medical problems.  Anticipated discharge in approximately  day(s).   The patient does have a current PCP (Sueanne Margarita, MD) and does not need an [facility] hospital follow-up appointment after discharge.  The patient does not have transportation limitations that hinder transportation to clinic appointments.  .Services Needed at time of discharge: Y = Yes, Blank = No PT:   OT:   RN:   Equipment:   Other:     LOS: 16 days   Lesly Dukes, MD 05/07/2013, 7:14 AM

## 2013-05-07 NOTE — Progress Notes (Signed)
S:  Paged by nurse ~ 9:55PM.  She noted irregular rhythm on monitor with HR in the 140s for the past 15 minutes.  EKG and telemetry reading Afib.  The patient was reportedly visiting with his daughter and asymptomatic. He did later report chest pain. When asked the severity he said it was not as bad as the pain he had with prior SVT.   Schedule amiodarone was given at 9:37PM.   O:  HR 130s, then back to 90-100s NSR. BP 95/53; General - no acute distress; Cardiac - Regular rhythm, no rubs, murmurs or gallops A/P:  Sinus tachycardia:  On review of EKG and telemetry there are p-waves visible and rhythm is sinus tachycardia with PVCs.  The patient's 10PM metoprolol was held in the setting of low BP.  HR improved to 90-100s.   The tachycardia may have been brought on by chronic back pain since it was approaching the time for his oxycodone. - Repeat EKG - sinus tachycardia, 115 bpm - CE x 3 - oxycodone given

## 2013-05-08 DIAGNOSIS — E41 Nutritional marasmus: Secondary | ICD-10-CM

## 2013-05-08 DIAGNOSIS — R000 Tachycardia, unspecified: Secondary | ICD-10-CM

## 2013-05-08 LAB — GLUCOSE, CAPILLARY
GLUCOSE-CAPILLARY: 74 mg/dL (ref 70–99)
Glucose-Capillary: 136 mg/dL — ABNORMAL HIGH (ref 70–99)
Glucose-Capillary: 129 mg/dL — ABNORMAL HIGH (ref 70–99)
Glucose-Capillary: 93 mg/dL (ref 70–99)

## 2013-05-08 LAB — CBC
HCT: 23 % — ABNORMAL LOW (ref 39.0–52.0)
Hemoglobin: 7.1 g/dL — ABNORMAL LOW (ref 13.0–17.0)
MCH: 23.7 pg — ABNORMAL LOW (ref 26.0–34.0)
MCHC: 30.9 g/dL (ref 30.0–36.0)
MCV: 76.7 fL — ABNORMAL LOW (ref 78.0–100.0)
Platelets: 137 10*3/uL — ABNORMAL LOW (ref 150–400)
RBC: 3 MIL/uL — ABNORMAL LOW (ref 4.22–5.81)
RDW: 22.6 % — ABNORMAL HIGH (ref 11.5–15.5)
WBC: 11.9 10*3/uL — ABNORMAL HIGH (ref 4.0–10.5)

## 2013-05-08 LAB — COMPREHENSIVE METABOLIC PANEL
ALK PHOS: 450 U/L — AB (ref 39–117)
ALT: 18 U/L (ref 0–53)
AST: 54 U/L — ABNORMAL HIGH (ref 0–37)
Albumin: 1.7 g/dL — ABNORMAL LOW (ref 3.5–5.2)
BUN: 41 mg/dL — ABNORMAL HIGH (ref 6–23)
CO2: 25 meq/L (ref 19–32)
Calcium: 7.8 mg/dL — ABNORMAL LOW (ref 8.4–10.5)
Chloride: 91 mEq/L — ABNORMAL LOW (ref 96–112)
Creatinine, Ser: 2.79 mg/dL — ABNORMAL HIGH (ref 0.50–1.35)
GFR calc non Af Amer: 23 mL/min — ABNORMAL LOW (ref >90)
GFR, EST AFRICAN AMERICAN: 27 mL/min — AB (ref >90)
GLUCOSE: 171 mg/dL — AB (ref 70–99)
POTASSIUM: 4 meq/L (ref 3.7–5.3)
SODIUM: 136 meq/L — AB (ref 137–147)
TOTAL PROTEIN: 6.4 g/dL (ref 6.0–8.3)
Total Bilirubin: 0.5 mg/dL (ref 0.3–1.2)

## 2013-05-08 LAB — PREPARE RBC (CROSSMATCH)

## 2013-05-08 MED ORDER — DEGARELIX ACETATE 120 MG ~~LOC~~ SOLR
240.0000 mg | Freq: Once | SUBCUTANEOUS | Status: AC
  Administered 2013-05-08: 240 mg via SUBCUTANEOUS
  Filled 2013-05-08: qty 6

## 2013-05-08 MED ORDER — OXYCODONE HCL 5 MG PO TABS
ORAL_TABLET | ORAL | Status: AC
  Filled 2013-05-08: qty 2

## 2013-05-08 NOTE — Progress Notes (Signed)
CSW continuing to follow patient for appropriate dc plans.  Jeanette Caprice, MSW, [place]

## 2013-05-08 NOTE — Progress Notes (Signed)
PT Cancellation Note  Patient Details Name: Joshua [name] MRN: [id number] DOB: 06/15/1950   Cancelled Treatment:    Reason Eval/Treat Not Completed: Patient declined, no reason specified. Pt sitting up in chair.  Pt declined PT and wife reports he "is too weak" to do therapy today.  Encouraged to do LE therex in the chair, but they refused.  Pt to go back to dialysis today they said and he is too weak.   Nykayla Marcelli LUBECK 05/08/2013, 9:45 AM

## 2013-05-08 NOTE — Progress Notes (Signed)
I spoke with the patient and his wife.  The image directed bone biopsy from his back came back nondiagnostic.  There is a significant chance that hehas metastatic involvement of his prostate cancer in his back.  With his continued pain, I think it worthwhile to start him on androgen deprivation.  I will order Mills Koller to be started either today or tomorrow-that  will provide rapid decrease in his testosterone.I did discuss side effects with the patient and his wife.

## 2013-05-08 NOTE — Progress Notes (Signed)
Assessment:  1 Acute on chronic renal failure of transplant kidney with uremic syndrome 2 FTT progressive fatigue, deconditioning, malnutrition  3 Confusion (intermittent) / myoclonus, improved 3 Volume overload / anasarca / ascites  4 NICM EF 30%  5 Back pain / T12 lesion biopsy- ? suggestive of malignancy  6 Paroxsymal atrial tach- on amio, MTP  7 HTN- on MTP 8 Anemia.  Will give PRBCs with dialysis  Subjective: Interval History: Less myoclonus  Objective: Vital signs in last 24 hours: Temp:  [97.7 F (36.5 [name])-99.3 F (37.4 [name])] 98.6 F (37 [name]) (03/02 0555) Pulse Rate:  [68-129] 93 (03/02 0555) Resp:  [11-18] 18 (03/02 0555) BP: (87-139)/(41-75) 133/75 mmHg (03/02 0555) SpO2:  [92 %-100 %] 98 % (03/02 0850) Weight:  [81.3 kg (179 lb 3.7 oz)-84.5 kg (186 lb 4.6 oz)] 81.3 kg (179 lb 3.7 oz) (03/02 0500) Weight change: 0.3 kg (10.6 oz)  Intake/Output from previous day: 03/01 0701 - 03/02 0700 In: 360 [P.O.:360] Out: 3551 [Urine:650; Stool:1] Intake/Output this shift:    General appearance: alert and cooperative Chest wall: no tenderness, 2+ presacral edema Cardio: regular rate and rhythm, S1, S2 normal, no murmur, click, rub or gallop GI: ascites and edema Extremities: edema 1+  Lab Results:  Recent Labs  05/07/13 0339 05/08/13 0344  WBC 10.0 11.9*  HGB 7.1* 7.1*  HCT 22.6* 23.0*  PLT 135* 137*   BMET:  Recent Labs  05/07/13 0339 05/08/13 0344  NA 139 136*  K 4.1 4.0  CL 91* 91*  CO2 26 25  GLUCOSE 111* 171*  BUN 57* 41*  CREATININE 3.16* 2.79*  CALCIUM 7.5* 7.8*   No results found for this basename: PTH,  in the last 72 hours Iron Studies: No results found for this basename: IRON, TIBC, TRANSFERRIN, FERRITIN,  in the last 72 hours Studies/Results: No results found.  Scheduled: . albuterol  2.5 mg Nebulization Q6H  . amiodarone  200 mg Oral BID  . antiseptic oral rinse  15 mL Mouth Rinse BID  . aspirin EC  81 mg Oral Daily  . atorvastatin  80 mg  Oral q1800  . calcium acetate  2,001 mg Oral TID WC  . calcium carbonate  800 mg of elemental calcium Oral QHS  . collagenase   Topical Daily  . enoxaparin (LOVENOX) injection  30 mg Subcutaneous Q24H  . feeding supplement (GLUCERNA SHAKE)  237 mL Oral BID BM  . heparin  20 Units/kg Dialysis Once in dialysis  . insulin aspart  0-9 Units Subcutaneous TID WC  . insulin glargine  20 Units Subcutaneous QHS  . isosorbide dinitrate  20 mg Oral BID  . levothyroxine  150 mcg Oral QAC breakfast  . metoprolol  25 mg Oral BID  . multivitamin with minerals  1 tablet Oral Daily  . mycophenolate  720 mg Oral BID  . pantoprazole  40 mg Oral Daily  . sirolimus  1 mg Oral q morning - 10a     LOS: 17 days   Joshua [name] 05/08/2013,9:08 AM

## 2013-05-08 NOTE — Progress Notes (Signed)
  Date: 05/08/2013  Patient name: Joshua [name]  Medical record number: [id number]  Date of birth: April 13, 1950   This patient has been seen and the plan of care was discussed with the house staff. Please see their note for complete details. I concur with their findings with the following additions/corrections: Discussed case with wife in room. She states mentally he is not much better. His abdomen is only slightly less distended. Not much myoclonus noted. At this time, he still appears uremic. Management per nephrology with HD.  As far as HCAP, this has been treated. He has episodes of SVT that have improved. He is on amiodarone. D/C plans pending nephro plans, as he will need LTAC with HD bed.  Dominic Pea, DO, [facility] 05/08/2013, 2:16 PM

## 2013-05-08 NOTE — Progress Notes (Signed)
Subjective: Patient seen and examined at the bedside this morning. He is sitting upright in his recliner by the bedside. His wife is in the room. He says he feels better. Jerkiness has improved and his belly is less distended.   His urologist called him and his wife and suggested androgen deprivation with Mills Koller shots to start either today or tomorrow. Side effects were discussed with the patient and his wife.  Objective: Vital signs in last 24 hours: Filed Vitals:   05/08/13 0152 05/08/13 0500 05/08/13 0555 05/08/13 0850  BP:   133/75   Pulse:   93   Temp:   98.6 F (37 C)   TempSrc:   Oral   Resp:   18   Height:      Weight:  179 lb 3.7 oz (81.3 kg)    SpO2: 96%  94% 98%   Weight change: 10.6 oz (0.3 kg)  Intake/Output Summary (Last 24 hours) at 05/08/13 1205 Last data filed at 05/08/13 0800  Gross per 24 hour  Intake    480 ml  Output   3551 ml  Net  -3071 ml   General: Resting in bed in NAD, occasional twitching of his bilateral shoulder/arm has improved Cardiac: RRR, no rubs, murmur or gallops  Pulm: CTAB Abd: soft, abdominal distention with no tenderness to palpation, no guarding, BS present Ext: warm and well perfused, 1+ pitting edema bilaterally to ankles Neuro: alert and oriented X3, follows commands appropriately, moves all 4 extremities voluntairly  Lab Results: Basic Metabolic Panel:  Recent Labs Lab 05/01/13 1908  05/03/13 0315 05/04/13 0244  05/07/13 0339 05/08/13 0344  NA 138  < > 135* 136*  < > 139 136*  K 4.0  < > 5.1 4.5  < > 4.1 4.0  CL 90*  < > 86* 89*  < > 91* 91*  CO2 27  < > 23 24  < > 26 25  GLUCOSE 92  < > 228* 162*  < > 111* 171*  BUN 66*  < > 70* 75*  < > 57* 41*  CREATININE 3.02*  < > 3.13* 3.45*  < > 3.16* 2.79*  CALCIUM 6.5*  < > 6.6* 6.5*  < > 7.5* 7.8*  MG 2.1  --   --   --   --   --   --   PHOS  --   < > 5.9* 6.0*  --   --   --   < > = values in this interval not displayed. Liver Function Tests:  Recent Labs Lab  05/07/13 0339 05/08/13 0344  AST 43* 54*  ALT 17 18  ALKPHOS 442* 450*  BILITOT 0.4 0.5  PROT 6.3 6.4  ALBUMIN 1.6* 1.7*   CBC:  Recent Labs Lab 05/03/13 0315 05/07/13 0339 05/08/13 0344  WBC 10.3 10.0 11.9*  NEUTROABS 8.2*  --   --   HGB 7.4* 7.1* 7.1*  HCT 23.7* 22.6* 23.0*  MCV 76.5* 76.9* 76.7*  PLT 172 135* 137*   Cardiac Enzymes:  Recent Labs Lab 05/02/13 0259 05/02/13 0902 05/07/13 2305  TROPONINI <0.30 <0.30 <0.30   CBG:  Recent Labs Lab 05/06/13 2013 05/07/13 0554 05/07/13 1607 05/07/13 2051 05/08/13 0622 05/08/13 1125  GLUCAP 95 107* 117* 215* 136* 129*   Thyroid Function Tests: No results found for this basename: TSH, T4TOTAL, FREET4, T3FREE, THYROIDAB,  in the last 168 hours Anemia Panel:  Recent Labs Lab 05/01/13 1245  RETICCTPCT 0.5   Micro Results:  Recent Results (from the past 240 hour(s))  CULTURE, BLOOD (ROUTINE X 2)     Status: None   Collection Time    04/28/13  8:55 PM      Result Value Ref Range Status   Specimen Description BLOOD RIGHT HAND   Final   Special Requests BOTTLES DRAWN AEROBIC AND ANAEROBIC 10CC EACH   Final   Culture  Setup Time     Final   Value: 04/29/2013 03:36     Performed at [facility]   Culture     Final   Value: NO GROWTH 5 DAYS     Performed at [facility]   Report Status 05/05/2013 FINAL   Final  BODY FLUID CULTURE     Status: None   Collection Time    04/29/13 10:54 AM      Result Value Ref Range Status   Specimen Description FLUID LEFT PLEURAL   Final   Special Requests Immunocompromised   Final   Gram Stain     Final   Value: WBC PRESENT,BOTH PMN AND MONONUCLEAR     NO ORGANISMS SEEN     CYTOSPIN     Performed at [facility]   Culture     Final   Value: NO GROWTH 3 DAYS     Performed at [facility]   Report Status 05/03/2013 FINAL   Final   Studies/Results: No results found. Medications: I have reviewed the patient's current  medications. Scheduled Meds: . albuterol  2.5 mg Nebulization Q6H  . amiodarone  200 mg Oral BID  . antiseptic oral rinse  15 mL Mouth Rinse BID  . aspirin EC  81 mg Oral Daily  . atorvastatin  80 mg Oral q1800  . calcium acetate  2,001 mg Oral TID WC  . calcium carbonate  800 mg of elemental calcium Oral QHS  . collagenase   Topical Daily  . degarelix  240 mg Subcutaneous Once  . enoxaparin (LOVENOX) injection  30 mg Subcutaneous Q24H  . feeding supplement (GLUCERNA SHAKE)  237 mL Oral BID BM  . heparin  20 Units/kg Dialysis Once in dialysis  . insulin aspart  0-9 Units Subcutaneous TID WC  . insulin glargine  20 Units Subcutaneous QHS  . isosorbide dinitrate  20 mg Oral BID  . levothyroxine  150 mcg Oral QAC breakfast  . metoprolol  25 mg Oral BID  . multivitamin with minerals  1 tablet Oral Daily  . mycophenolate  720 mg Oral BID  . pantoprazole  40 mg Oral Daily  . sirolimus  1 mg Oral q morning - 10a   Continuous Infusions: . sodium chloride 12.5 mL/hr at 05/01/13 0849   PRN Meds:.sodium chloride, sodium chloride, acetaminophen, calamine, diphenhydrAMINE, feeding supplement (NEPRO CARB STEADY), heparin, lidocaine (PF), lidocaine-prilocaine, oxyCODONE, pentafluoroprop-tetrafluoroeth Assessment/Plan: 63 year old male with a PMH of HTN, DM2, CAD (s/p MI and LAD stent), kidney transplantation, carotid artery stenosis (s/p CEA 08/2011), PVD (s/p fem-pop in 1980), dHF (EF 45-50% Jan 2015), hx of papillary thyroid cancer and prostate cancer who presents with complaint of edema x 3 days and dyspnea for several weeks.   Volume overload: Was likely 2/2 worsening CHF in the setting of CKD about 13ys s/p renal transplant. CXR on presentation with small bilateral pleural effusions. Right heart cath on 02/20 revealed normal PCWP and normal CO. Dry weight appears to be 180 pounds. He has undergone HD x2, weight decreased from 188lb > 179 lbs. His  O2 saturation is stable at 93-98% on 3L. His  abdominal distention and edema appear to be improved from prior caregivers' exams. - Appreciate Nephrology's recommendations - Continue Lopressor 25mg  BID, isordil 40mg  BID  - Daily weights and I&Os > -3.2L yesterday - Plans for d/c to [facility] as soon as bed is available, apparently there may be one available this afternoon but it is unclear if it is a dialysis bed  B/L pleural effusions: Antibiotics were started on 02/20 and his dyspnea has improved. Thoracentesis completed 02/22 and yielded 780cc of yellow fluid. Analysis consistent with transudate likely 2/2 to heart failure and hypoalbuminemia. Pleural fluid culture negative.  - Oxygen supplementation prn  - incentive spirometer  - albuterol nebs q6h   HCAP: Fevers, leukocytosis and possible LLL infiltrate on CXR in an immunosuppressed patient. No recent fevers and no leukocytosis. Final blood cultures - no growth. Antibiotic treatment completed.  - oxygen supplementation prn  - albuterol nebs prn q6h   Tachyarrhythmias: Episodes of sustained SVT to the 140s with chest discomfort last week. CP resolved once he was back in NSR. EP has seen him regarding SVT and determined that a this time he is a poor candidate for ablation. He had sinus tachycardia rate 150s overnight (telemeter read A Fib but there were P waves present throughout), evaluated by night team and largely asymptomatic, he did have an episode of hypotension 87/44. Back pain was treated with oxycodone and tachycardia resolved, BP improved to normal, troponin negative x1 - Continue telemetry - Continue metoprolol  - Continue amiodarone 200mg  BID; plan decrease to 200mg  daily in 2 weeks (~05/13/13); low dose was started because there is potential for interaction with sirolimus - IV lopressor prn  - Will not adjust Synthroid yet as amio may make him hyper or more hypo.   Chest pain: Resolved. Likely 2/2 to SVT.  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 Baseline Cr 1.9-2.5. Had been stable around 2.8 but has increased above to nearly 4 in past few days. Renal was re-consulted yesterday who decided to do a trial of HD x3 days. He appears to be improving in terms of confusion and myoclonus, which were likely secondary to uremia. - Continue anti-rejection meds  - Appreciate Nephrology recommendations - Continue HD per renal  Creatinine, Ser  Date Value Ref Range Status  05/08/2013 2.79* 0.50 - 1.35 mg/dL Final  05/07/2013 3.16* 0.50 - 1.35 mg/dL Final  05/06/2013 3.91* 0.50 - 1.35 mg/dL Final  05/04/2013 3.45* 0.50 - 1.35 mg/dL Final  05/03/2013 3.13* 0.50 - 1.35 mg/dL Final    Acute on chronic anemia: Stable. Likely anemia of chronic disease.  - Per renal, we'll transfuse 1 unit packed red blood cells during dialysis today  Hemoglobin  Date Value Ref Range Status  05/08/2013 7.1* 13.0 - 17.0 g/dL Final  05/07/2013 7.1* 13.0 - 17.0 g/dL Final  05/03/2013 7.4* 13.0 - 17.0 g/dL Final  05/02/2013 7.0* 13.0 - 17.0 g/dL Final  05/01/2013 7.0* 13.0 - 17.0 g/dL Final    Hypocalcemia: 6.3 on admission and he received calcium gluconate. Repeat on 12/15 was 6.1 w/ albumin 1.6. Corrected Ca was 8.0. However ionized Calcium low at 0.71-->0.79.  - Replacing Calcium with TUMS and PhosLo  Calcium  Date Value Ref Range Status  05/08/2013 7.8* 8.4 - 10.5 mg/dL Final  05/07/2013 7.5* 8.4 - 10.5 mg/dL Final  05/06/2013 7.0* 8.4 - 10.5 mg/dL Final  05/04/2013 6.5* 8.4 -  10.5 mg/dL Final  05/03/2013 6.6* 8.4 - 10.5 mg/dL Final    DM type 2: Previously on 24 units Lantus at home, but wife reported hypoglycemic episodes, so she was administering 12 units over the past few days prior to admission. BG 98-234 in past 24 hours.  - continue Lantus 20 units qHS  - SSI sensitive   HTN: Stable.  - Continued Lopressor, isordil - D/c hydralazine, lasix.  H/o prostate cancer with T10 lesion noted on prior MRI: Question of spinal mets at last hospitalization,  reportedly seen on MRI, but negative bone scan 02/2013. Patient seen by Dr. Diona Fanti with Urology during prior hospitalization. 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 His back pain is being controlled with oxycodone and acetaminophen.  - Continue Oxycodone IR 10mg  q 4h PRN and Tylenol  - Appreciate urology recommendations, starting Firmagon injections 240 mg subcutaneous  Unstageable pressure ulcer, sacral: Patient aware of ulcer prior to admission. Wound care consulted and recommendations appreciated.  - Twice daily - cleanse with normal saline and pat dry, debridement with Santyl, moist NS dressing (pack into wound), secure with dry 4x4 and tape  - pressure redistribution pad   Severe malnutrition in the context of chronic illness: Continue Glucerna shakes, multivitamin.   DVT ppx: SCDs and lovenox   Dispo: Disposition is deferred at this time, awaiting improvement of current medical problems.  Anticipated discharge in approximately  day(s).   The patient does have a current PCP (Sueanne Margarita, MD) and does not need an [facility] hospital follow-up appointment after discharge.  The patient does not have transportation limitations that hinder transportation to clinic appointments.  .Services Needed at time of discharge: Y = Yes, Blank = No PT:   OT:   RN:   Equipment:   Other:     LOS: 17 days   Lesly Dukes, MD 05/08/2013, 12:05 PM

## 2013-05-09 ENCOUNTER — Inpatient Hospital Stay (HOSPITAL_COMMUNITY): Payer: Medicare Other

## 2013-05-09 ENCOUNTER — Inpatient Hospital Stay: Admission: AD | Admit: 2013-05-09 | Payer: Medicare Other | Source: Ambulatory Visit | Admitting: Internal Medicine

## 2013-05-09 DIAGNOSIS — R141 Gas pain: Secondary | ICD-10-CM

## 2013-05-09 DIAGNOSIS — R143 Flatulence: Secondary | ICD-10-CM

## 2013-05-09 DIAGNOSIS — R142 Eructation: Secondary | ICD-10-CM

## 2013-05-09 LAB — GLUCOSE, CAPILLARY
GLUCOSE-CAPILLARY: 49 mg/dL — AB (ref 70–99)
GLUCOSE-CAPILLARY: 102 mg/dL — AB (ref 70–99)
GLUCOSE-CAPILLARY: 116 mg/dL — AB (ref 70–99)
Glucose-Capillary: 39 mg/dL — CL (ref 70–99)
Glucose-Capillary: 51 mg/dL — ABNORMAL LOW (ref 70–99)
Glucose-Capillary: 80 mg/dL (ref 70–99)
Glucose-Capillary: 88 mg/dL (ref 70–99)

## 2013-05-09 LAB — CBC
HCT: 42.5 % (ref 39.0–52.0)
HEMATOCRIT: 32.2 % — AB (ref 39.0–52.0)
HEMOGLOBIN: 13.9 g/dL (ref 13.0–17.0)
HEMOGLOBIN: 10.5 g/dL — AB (ref 13.0–17.0)
MCH: 26.1 pg (ref 26.0–34.0)
MCH: 25.9 pg — ABNORMAL LOW (ref 26.0–34.0)
MCHC: 32.7 g/dL (ref 30.0–36.0)
MCHC: 32.6 g/dL (ref 30.0–36.0)
MCV: 79.7 fL (ref 78.0–100.0)
MCV: 79.3 fL (ref 78.0–100.0)
Platelets: DECREASED 10*3/uL (ref 150–400)
Platelets: UNDETERMINED 10*3/uL (ref 150–400)
RBC: 5.33 MIL/uL (ref 4.22–5.81)
RBC: 4.06 MIL/uL — ABNORMAL LOW (ref 4.22–5.81)
RDW: 20.9 % — ABNORMAL HIGH (ref 11.5–15.5)
RDW: 21.4 % — ABNORMAL HIGH (ref 11.5–15.5)
WBC: 11.2 10*3/uL — AB (ref 4.0–10.5)
WBC: 12 10*3/uL — AB (ref 4.0–10.5)

## 2013-05-09 LAB — COMPREHENSIVE METABOLIC PANEL
ALT: 38 U/L (ref 0–53)
AST: 137 U/L — ABNORMAL HIGH (ref 0–37)
Albumin: 2.1 g/dL — ABNORMAL LOW (ref 3.5–5.2)
Alkaline Phosphatase: 586 U/L — ABNORMAL HIGH (ref 39–117)
BUN: 29 mg/dL — ABNORMAL HIGH (ref 6–23)
CO2: 26 meq/L (ref 19–32)
Calcium: 8.7 mg/dL (ref 8.4–10.5)
Chloride: 91 mEq/L — ABNORMAL LOW (ref 96–112)
Creatinine, Ser: 2.53 mg/dL — ABNORMAL HIGH (ref 0.50–1.35)
GFR, EST AFRICAN AMERICAN: 30 mL/min — AB (ref >90)
GFR, EST NON AFRICAN AMERICAN: 26 mL/min — AB (ref >90)
GLUCOSE: 31 mg/dL — AB (ref 70–99)
Potassium: 4.9 mEq/L (ref 3.7–5.3)
Sodium: 141 mEq/L (ref 137–147)
Total Bilirubin: 0.7 mg/dL (ref 0.3–1.2)
Total Protein: 7.6 g/dL (ref 6.0–8.3)

## 2013-05-09 LAB — TYPE AND SCREEN
ABO/RH(D): O POS
Antibody Screen: NEGATIVE
UNIT DIVISION: 0
Unit division: 0

## 2013-05-09 MED ORDER — ALBUTEROL SULFATE (2.5 MG/3ML) 0.083% IN NEBU
2.5000 mg | INHALATION_SOLUTION | Freq: Three times a day (TID) | RESPIRATORY_TRACT | Status: DC
  Administered 2013-05-10 – 2013-05-11 (×6): 2.5 mg via RESPIRATORY_TRACT
  Filled 2013-05-09 (×6): qty 3

## 2013-05-09 MED ORDER — METOPROLOL TARTRATE 50 MG PO TABS
50.0000 mg | ORAL_TABLET | Freq: Two times a day (BID) | ORAL | Status: DC
  Administered 2013-05-09 (×2): 50 mg via ORAL
  Filled 2013-05-09 (×5): qty 1

## 2013-05-09 MED ORDER — DEXTROSE 50 % IV SOLN
INTRAVENOUS | Status: AC
  Filled 2013-05-09: qty 50

## 2013-05-09 MED ORDER — IOHEXOL 300 MG/ML  SOLN: 25.0000 mL | INTRAMUSCULAR | Status: AC

## 2013-05-09 MED ORDER — INSULIN GLARGINE 100 UNIT/ML ~~LOC~~ SOLN
10.0000 [IU] | Freq: Every day | SUBCUTANEOUS | Status: DC
  Administered 2013-05-12 – 2013-05-13 (×2): 10 [IU] via SUBCUTANEOUS
  Filled 2013-05-09 (×8): qty 0.1

## 2013-05-09 MED ORDER — DEXTROSE 50 % IV SOLN
25.0000 mL | Freq: Once | INTRAVENOUS | Status: AC | PRN
  Administered 2013-05-09: 25 mL via INTRAVENOUS

## 2013-05-09 NOTE — Discharge Summary (Signed)
  Date: 05/09/2013  Patient name: Joshua [name]  Medical record number: [id number]  Date of birth: 07/04/1950   This patient has been seen and the plan of care was discussed with the house staff. Please see their note for complete details. I concur with their findings and plan.  Dominic Pea, DO, [facility] 05/09/2013, 3:33 PM

## 2013-05-09 NOTE — Progress Notes (Signed)
  Date: 05/09/2013  Patient name: Joshua [name]  Medical record number: [id number]  Date of birth: [birth year]   This patient has been seen and the plan of care was discussed with the house staff. Please see their note for complete details. I concur with their findings with the following additions/corrections: Mental status is improved today. I see no myoclonus today. Abdomen is distended, but non tender. His appetite is still poor and he had some hypoglycemia as a result. At this time, agree with 50% reduction in Lantus. He will need q6hr accucheks. Encourage PO. As far as abdomen distension, this could be ascites. The question is of metastatic disease and carcinomatosis. He has no peritoneal signs. The CT can be performed to visualize for any evidence but he will need outpatient evaluation by oncology/urology and paracentesis if this is in question.  As far as his tachycardia, we have to becareful decreasing his BB dose. If hypotension is an issue on HD, then less volume will need to be removed. This may be a rebound tachy from previous decrease in dose of BB. Monitor BP as outpatient. He is asymptomatic.  Otherwise, he is medically stable for LTAC.  Dominic Pea, DO, [facility] 05/09/2013, 2:36 PM

## 2013-05-09 NOTE — Progress Notes (Signed)
Physical Therapy Treatment Patient Details Name: Joshua [name] MRN: [id number] DOB: 06-13-1950 Today's Date: 05/09/2013 Time: 7867-6720 PT Time Calculation (min): 24 min  PT Assessment / Plan / Recommendation  History of Present Illness Pt admitted with CHF, ESRD, underwent T 10 kyphoplasty, has sacral wound  PMH of HTN, DM2, CAD with stents, CAD, PVD s/p fem pop, prostate cancer treated with radiation, hx of thyroid cancer, h/o ESRD and kidney transplantation    PT Comments   Pt slow to progress.  Will need SNF/LTACH level rehab.  Follow Up Recommendations  LTACH;SNF     Does the patient have the potential to tolerate intense rehabilitation     Barriers to Discharge        Equipment Recommendations  None recommended by PT    Recommendations for Other Services    Frequency Min 3X/week   Progress towards PT Goals Progress towards PT goals: Progressing toward goals;Goals downgraded-see care plan  Plan Current plan remains appropriate    Precautions / Restrictions Precautions Precautions: Fall Precaution Comments: sacral wound, bed alarm   Pertinent Vitals/Pain     Mobility  Bed Mobility Overal bed mobility: Needs Assistance Bed Mobility: Supine to Sit Rolling: Min assist General bed mobility comments: truncal assist;  asssit with LE's and tc's for guidance Transfers Overall transfer level: Needs assistance Equipment used: Rolling walker (2 wheeled) Transfers: Sit to/from [facility] Sit to Stand: Min assist Stand pivot transfers: Min assist General transfer comment: cues for safety and stability assist due to significant LE weakness and minor knee buckling.  sit to stand times 3 and transfer to [facility]    Exercises General Exercises - Lower Extremity Heel Slides: AAROM;Strengthening;Both;10 reps Hip ABduction/ADduction: AAROM;Strengthening;Both;10 reps;Supine   PT Diagnosis:    PT Problem List:   PT Treatment Interventions:     PT Goals  (current goals can now be found in the care plan section) Acute Rehab PT Goals PT Goal Formulation: With patient Time For Goal Achievement: 05/23/13 Potential to Achieve Goals: Fair  Visit Information  Last PT Received On: 05/09/13 Assistance Needed: +1 History of Present Illness: Pt admitted with CHF, ESRD, underwent T 10 kyphoplasty, has sacral wound  PMH of HTN, DM2, CAD with stents, CAD, PVD s/p fem pop, prostate cancer treated with radiation, hx of thyroid cancer, h/o ESRD and kidney transplantation     Subjective Data  Subjective: I'd like to try   Cognition  Cognition Arousal/Alertness: Awake/alert Overall Cognitive Status: Within Functional Limits for tasks assessed Problem Solving: Slow processing    Balance  Balance Overall balance assessment: No apparent balance deficits (not formally assessed) Sitting-balance support: No upper extremity supported;Single extremity supported Sitting balance-Leahy Scale: Good Standing balance support: Bilateral upper extremity supported Standing balance-Leahy Scale: Poor  End of Session PT - End of Session Equipment Utilized During Treatment: Gait belt;Oxygen Activity Tolerance: Patient limited by fatigue Patient left: in bed;with call bell/phone within reach Nurse Communication: Mobility status;Precautions   GP     Meredith Mells, Tessie Fass 05/09/2013, 4:24 PM 05/09/2013  Donnella Sham, PT [phone]  (pager)

## 2013-05-09 NOTE — Progress Notes (Signed)
Subjective: Patient seen and examined at the bedside this morning. He is lying in bed in no acute distress. He says he is in no pain and feels comfortable. His mental status seems improved, he is less confused. Myoclonus has improved as well. He still has a distended belly. He says he has not been eating and drinking well in the hospital, especially with dialysis as it tires him out. He denies current chest pain, shortness of breath.  Objective: Vital signs in last 24 hours: Filed Vitals:   05/09/13 0422 05/09/13 0643 05/09/13 1007 05/09/13 1244  BP: 117/73  101/62 135/71  Pulse: 126  113 127  Temp: 97.7 F (36.5 C)   98.7 F (37.1 C)  TempSrc: Oral   Oral  Resp: 20   20  Height:      Weight:  175 lb 11.3 oz (79.7 kg)    SpO2: 92%   100%   Weight change: -4 lb 10.1 oz (-2.1 kg)  Intake/Output Summary (Last 24 hours) at 05/09/13 1415 Last data filed at 05/09/13 0900  Gross per 24 hour  Intake    790 ml  Output   2448 ml  Net  -1658 ml   General: Resting in bed in NAD, occasional twitching of his bilateral shoulder/arm has improved Cardiac: RRR, no rubs, murmur or gallops  Pulm: CTAB Abd: soft, abdominal distention with no tenderness to palpation, no guarding, BS present Ext: warm and well perfused, no lower extremity edema Neuro: alert and oriented X3, follows commands appropriately, moves all 4 extremities voluntairly  Lab Results: Basic Metabolic Panel:  Recent Labs Lab 05/03/13 0315 05/04/13 0244  05/08/13 0344 05/09/13 0655  NA 135* 136*  < > 136* 141  K 5.1 4.5  < > 4.0 4.9  CL 86* 89*  < > 91* 91*  CO2 23 24  < > 25 26  GLUCOSE 228* 162*  < > 171* 31*  BUN 70* 75*  < > 41* 29*  CREATININE 3.13* 3.45*  < > 2.79* 2.53*  CALCIUM 6.6* 6.5*  < > 7.8* 8.7  PHOS 5.9* 6.0*  --   --   --   < > = values in this interval not displayed. Liver Function Tests:  Recent Labs Lab 05/08/13 0344 05/09/13 0655  AST 54* 137*  ALT 18 38  ALKPHOS 450* 586*  BILITOT 0.5  0.7  PROT 6.4 7.6  ALBUMIN 1.7* 2.1*   CBC:  Recent Labs Lab 05/03/13 0315  05/08/13 0344 05/09/13 0655  WBC 10.3  < > 11.9* 11.2*  NEUTROABS 8.2*  --   --   --   HGB 7.4*  < > 7.1* 13.9  HCT 23.7*  < > 23.0* 42.5  MCV 76.5*  < > 76.7* 79.7  PLT 172  < > 137* PLATELET CLUMPS NOTED ON SMEAR, COUNT APPEARS DECREASED  < > = values in this interval not displayed. Cardiac Enzymes:  Recent Labs Lab 05/07/13 2305  TROPONINI <0.30   CBG:  Recent Labs Lab 05/08/13 2242 05/09/13 0640 05/09/13 0656 05/09/13 0720 05/09/13 0759 05/09/13 1128  GLUCAP 93 39* 51* 49* 102* 116*   Thyroid Function Tests: No results found for this basename: TSH, T4TOTAL, FREET4, T3FREE, THYROIDAB,  in the last 168 hours Anemia Panel: No results found for this basename: VITAMINB12, FOLATE, FERRITIN, TIBC, IRON, RETICCTPCT,  in the last 168 hours Micro Results: No results found for this or any previous visit (from the past 240 hour(s)). Studies/Results: No results found.  Medications: I have reviewed the patient's current medications. Scheduled Meds: . albuterol  2.5 mg Nebulization Q6H  . amiodarone  200 mg Oral BID  . antiseptic oral rinse  15 mL Mouth Rinse BID  . aspirin EC  81 mg Oral Daily  . atorvastatin  80 mg Oral q1800  . calcium acetate  2,001 mg Oral TID WC  . calcium carbonate  800 mg of elemental calcium Oral QHS  . collagenase   Topical Daily  . enoxaparin (LOVENOX) injection  30 mg Subcutaneous Q24H  . feeding supplement (GLUCERNA SHAKE)  237 mL Oral BID BM  . insulin aspart  0-9 Units Subcutaneous TID WC  . insulin glargine  10 Units Subcutaneous QHS  . isosorbide dinitrate  20 mg Oral BID  . levothyroxine  150 mcg Oral QAC breakfast  . metoprolol  50 mg Oral BID  . multivitamin with minerals  1 tablet Oral Daily  . mycophenolate  720 mg Oral BID  . pantoprazole  40 mg Oral Daily  . sirolimus  1 mg Oral q morning - 10a   Continuous Infusions: . sodium chloride 12.5 mL/hr  at 05/01/13 0849   PRN Meds:.acetaminophen, calamine, diphenhydrAMINE, oxyCODONE  Assessment/Plan: 63 year old male with a PMH of HTN, DM2, CAD (s/p MI and LAD stent), kidney transplantation, carotid artery stenosis (s/p CEA 08/2011), PVD (s/p fem-pop in 1980), dHF (EF 45-50% Jan 2015), hx of papillary thyroid cancer and prostate cancer who presents with complaint of edema x 3 days and dyspnea for several weeks.   #Volume overload - Was likely 2/2 worsening CHF in the setting of CKD about 13ys s/p renal transplant. CXR on presentation with small bilateral pleural effusions. Right heart cath on 02/20 revealed normal PCWP and normal CO. Dry weight appears to be 180 pounds. He has undergone HD x3, weight decreased from 188lb > 179 lbs. His O2 saturation is stable at 93-98% on 3L. He still has abdominal distention, but his lower extremity edema has resolved. - Appreciate Nephrology's recommendations, will resume MWF dialysis schedule tomorrow, they will work on outpatient scheduling for the future - Continue Lopressor 50mg  BID, isordil 40mg  BID  - Daily weights and I&Os > -1.5L yesterday - Plans for d/c to [facility] as soon as bed is available, likely this afternoon, AFTER CT abdomen pelvis has been performed and we have ruled out carcinomatosis or metastasis  #Abdominal distention - Patient still with abdominal distention. Given history of prostate cancer, would be prudent to evaluate for carcinomatosis or metastasis. He has not had a recent study. - CT abdomen/pelvis without contrast (to preserve transplant kidney in the event it recovers) - If ascites is present, consider diagnosis/therapeutic paracentesis at [facility]  #Tachyarrhythmias - Episodes of sustained SVT to the 140s with chest discomfort last week. CP resolved once he was back in NSR. EP has seen him regarding SVT and determined that a this time he is a poor candidate for ablation. He had sinus tachycardia rate 130s this morning,  asymptomatic. Likely multifactorial from hypoglycemia (see below), back pain, and recent decrease in metoprolol. - Back pain was treated with oxycodone - Continue telemetry - Continue metoprolol, increase to 50mg  BID - Continue amiodarone 200mg  BID; plan decrease to 200mg  daily in 2 weeks (~05/13/13); low dose was started because there is potential for interaction with sirolimus - IV lopressor prn  - Will not adjust Synthroid yet as amio may make him hyper or more hypo.   #B/L pleural effusions - Antibiotics were  started on 02/20 and his dyspnea has improved. Thoracentesis completed 02/22 and yielded 780cc of yellow fluid. Analysis consistent with transudate likely 2/2 to heart failure and hypoalbuminemia. Pleural fluid culture negative.  - Oxygen supplementation prn  - Incentive spirometer  - Albuterol nebs q6h   #HCAP - Fevers, leukocytosis and possible LLL infiltrate on CXR in an immunosuppressed patient. No recent fevers and no leukocytosis. Final blood cultures - no growth. Antibiotic treatment completed.  - Oxygen supplementation prn  - Albuterol nebs prn q6h   #Chest pain - Resolved. Likely 2/2 to SVT.  - Continue telemetry   #CKD stage 3/4 - Pt ~13 years s/p renal transplant. Baseline Cr 1.9-2.5. Had been stable around 2.8 but has increased above to nearly 4 in past few days. Renal was re-consulted yesterday who decided to do a trial of HD x3 days. He appears to be improving in terms of confusion and myoclonus, which were likely secondary to uremia. - Continue anti-rejection meds, will monitor for recovery of the transplanted kidney, but likely this will not occur per renal - Nephrology plans to taper these meds over the next 6-12 months to prevent graft-versus-host disease - Continue HD per renal, MWF  Creatinine, Ser  Date Value Ref Range Status  05/09/2013 2.53* 0.50 - 1.35 mg/dL Final  05/08/2013 2.79* 0.50 - 1.35 mg/dL Final  05/07/2013 3.16* 0.50 - 1.35 mg/dL Final  05/06/2013  3.91* 0.50 - 1.35 mg/dL Final  05/04/2013 3.45* 0.50 - 1.35 mg/dL Final    #Acute on chronic anemia -  Likely anemia of chronic disease. He got RBCs during dialysis yesterday. Hemoglobin increased more than expected, from 7.1 to 13.9. Goal hemoglobin 10-11. - Repeat CBC now - CBC in am  Hemoglobin  Date Value Ref Range Status  05/09/2013 13.9  13.0 - 17.0 g/dL Final     REPEATED TO VERIFY     DELTA CHECK NOTED     POST TRANSFUSION SPECIMEN  05/08/2013 7.1* 13.0 - 17.0 g/dL Final  05/07/2013 7.1* 13.0 - 17.0 g/dL Final  05/03/2013 7.4* 13.0 - 17.0 g/dL Final  05/02/2013 7.0* 13.0 - 17.0 g/dL Final    #Hypocalcemia - 6.3 on admission and he received calcium gluconate. Repeat on 12/15 was 6.1 w/ albumin 1.6. Corrected Ca was 8.0. However ionized Calcium low at 0.71-->0.79.  - Replacing Calcium with TUMS and PhosLo  Calcium  Date Value Ref Range Status  05/09/2013 8.7  8.4 - 10.5 mg/dL Final  05/08/2013 7.8* 8.4 - 10.5 mg/dL Final  05/07/2013 7.5* 8.4 - 10.5 mg/dL Final  05/06/2013 7.0* 8.4 - 10.5 mg/dL Final  05/04/2013 6.5* 8.4 - 10.5 mg/dL Final    #DM type 2 - He had an episode of hypoglycemia overnight to 39. Oral fluids and D5 were given with good response. Likely 2/2 decreased po intake combined with dialysis (as insulin is not dialyzed). - Decrease Lantus to 10 units qHS  - SSI sensitive   Glucose-Capillary  Date Value Ref Range Status  05/09/2013 116* 70 - 99 mg/dL Final  05/09/2013 102* 70 - 99 mg/dL Final  05/09/2013 49* 70 - 99 mg/dL Final  05/09/2013 51* 70 - 99 mg/dL Final  05/09/2013 39* 70 - 99 mg/dL Final    #HTN - Stable.  - Continued Lopressor, isordil - D/c hydralazine, lasix  #H/o prostate cancer with T10 lesion noted on prior MRI - Question of spinal mets at last hospitalization, reportedly seen on MRI, but negative bone scan 02/2013. Patient seen by Dr. Diona Fanti  with Urology during prior hospitalization. 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 His back pain is being controlled with oxycodone and acetaminophen.  - Continue Oxycodone IR 10mg  q 4h PRN and Tylenol  - Appreciate urology recommendations, starting Firmagon injections 240 mg subcutaneous  #Unstageable pressure ulcer, sacral - Patient aware of ulcer prior to admission. Wound care consulted and recommendations appreciated.  - Twice daily - cleanse with normal saline and pat dry, debridement with Santyl, moist NS dressing (pack into wound), secure with dry 4x4 and tape  - pressure redistribution pad   #Severe malnutrition in the context of chronic illness - Continue Glucerna shakes, multivitamin.   #DVT ppx - SCDs and lovenox   Dispo: Disposition is deferred at this time, awaiting improvement of current medical problems.  Anticipated discharge in approximately  day(s).   The patient does have a current PCP (Sueanne Margarita, MD) and does not need an [facility] hospital follow-up appointment after discharge.  The patient does not have transportation limitations that hinder transportation to clinic appointments.  .Services Needed at time of discharge: Y = Yes, Blank = No PT:   OT:   RN:   Equipment:   Other:     LOS: 18 days   Lesly Dukes, MD 05/09/2013, 2:15 PM

## 2013-05-09 NOTE — Discharge Summary (Signed)
Patient Name:  Joshua [name]  MRN: [id number]  PCP: Sueanne Margarita, MD  DOB:  1950-07-24       Date of Admission:  04/21/2013  Date of Discharge:  05/15/13     Attending Physician: Dr. Dominic Pea, DO         DISCHARGE DIAGNOSES: 1.   Acute on chronic systolic heart failure 2.   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   Healthcare-associated pneumonia 4.   Chest pain secondary to supraventricular tachycardia 5.   Acute on chronic anemia 6.   Hypocalcemia 7.   Hypertension 8.   Hypothyroidism 9.   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   Severe malnutrition in the context of chronic illness 11.   Controlled diabetes mellitus type 2 with complications 12.   Unstageable pressure ulcer, sacral 13.   Multiple liver masses   DISPOSITION AND FOLLOW-UP: Joshua [name] is to follow-up with the listed providers as detailed below, at patient's visiting, please address following issues:     Follow-up Information   Please follow up. (Please follow up with the hospice doctor at [facility]:   Medication List    STOP taking these medications       aspirin 81 MG chewable tablet     atorvastatin 80 MG tablet  Commonly known as:  LIPITOR     CALCIUM 1200 1200-1000 MG-UNIT Chew     diltiazem 120 MG 24 hr capsule  Commonly known as:  CARDIZEM CD     insulin lispro 100 UNIT/ML injection  Commonly known as:  HUMALOG     isosorbide mononitrate 30 MG 24 hr tablet  Commonly known as:  IMDUR     LANTUS 100 UNIT/ML injection  Generic drug:  insulin glargine     levothyroxine 150 MCG tablet  Commonly known as:  SYNTHROID, LEVOTHROID     methocarbamol 500 MG tablet  Commonly known as:  ROBAXIN     metoprolol 50 MG tablet  Commonly known as:  LOPRESSOR     mycophenolate 180 MG EC tablet  Commonly known as:  MYFORTIC     Oxycodone HCl 10 MG Tabs     pantoprazole 40  MG tablet  Commonly known as:  PROTONIX     sirolimus 2 MG tablet  Commonly known as:  RAPAMUNE     sodium bicarbonate 650 MG tablet     Vitamin D (Ergocalciferol) 50000 UNITS Caps capsule  Commonly known as:  DRISDOL      TAKE these medications       albuterol (2.5 MG/3ML) 0.083% nebulizer solution  Commonly known as:  PROVENTIL  Take 3 mLs (2.5 mg total) by nebulization every 6 (six) hours as needed for wheezing or shortness of breath.     antiseptic oral rinse Liqd  15 mLs by Mouth Rinse route 2 (two) times daily.     collagenase ointment  Commonly known as:  SANTYL  Apply topically daily.     HYDROmorphone 1 MG/ML Soln injection  Commonly known as:  DILAUDID  Inject 0.5 mLs (0.5 mg total) into the vein every 2 (two) hours as needed for moderate pain (shortness of breath, tachypnea).     lip balm Oint  Apply 1 application topically as needed for lip care.     LORazepam 2 MG/ML injection  Commonly known as:  ATIVAN  Inject 0.5 mLs (1  mg total) into the vein every 4 (four) hours as needed for anxiety.         CONSULTS:  Cardiology Electrophysiology Nephrology Pharmacy Wound Care Palliative Care Oncology  PROCEDURES PERFORMED:  Dg Chest 1 View  04/29/2013   CLINICAL DATA:  Thoracentesis  EXAM: CHEST - 1 VIEW  COMPARISON:  DG CHEST 2 VIEW dated 04/27/2013  FINDINGS: The heart remains moderately enlarged. Left pleural effusion resolve after the thoracentesis. No ensuing pneumothorax. Overall, vascular congestion and edema have also improved. Right pleural effusion has probably also improved.  IMPRESSION: No pneumothorax post left thoracentesis.   Electronically Signed   By: Maryclare BeanArt  Hoss M.D.   On: 04/29/2013 11:27   Dg Chest 2 View  04/27/2013   CLINICAL DATA:  Shortness of breath, dyspnea, weakness  EXAM: CHEST  2 VIEW  COMPARISON:  DG CHEST 1V PORT dated 04/27/2013; DG CHEST 2 VIEW dated 04/24/2013; DG CHEST 2 VIEW dated 04/22/2013  FINDINGS: Grossly unchanged enlarged  cardiac silhouette and mediastinal contours. The pulmonary vasculature is less distinct on the present examination with cephalization of flow. Worsening perihilar and bilateral medial basilar heterogeneous/consolidative opacities, left greater than right. Interval development of small bilateral pleural effusions. No pneumothorax. Grossly unchanged bones. Multiple surgical clips overlie the midline of the lower neck. A vascular stent overlies the inner aspect of the left upper arm. Surgical clips overlie the bilateral axilla.  IMPRESSION: Findings most suggestive of worsening pulmonary edema, small bilateral effusions and perihilar/medial basilar opacities, left greater than right, atelectasis versus infiltrate.   Electronically Signed   By: Simonne ComeJohn  Watts M.D.   On: 04/27/2013 11:29   Dg Chest 2 View  04/24/2013   CLINICAL DATA:  Dyspnea  EXAM: CHEST  2 VIEW  COMPARISON:  Prior radiograph from 04/22/2013  FINDINGS: Cardiomegaly is stable as compared to the prior exam.  Lungs are mildly hypoinflated. There is diffuse pulmonary vascular congestion with indistinctness of the interstitial markings, compatible with pulmonary edema, worsened. Small bilateral pleural effusions are present. More confluent bilateral perihilar opacities likely reflect alveolar edema. No definite focal infiltrates identified. No pneumothorax.  Surgical clips overlie the left axilla and lower neck. No acute osseous abnormality.  IMPRESSION: Cardiomegaly with small bilateral pleural effusions and moderate diffuse pulmonary edema, worsened as compared to 04/22/13.   Electronically Signed   By: Rise MuBenjamin  McClintock M.D.   On: 04/24/2013 03:12   Dg Chest 2 View  04/22/2013   CLINICAL DATA:  Shortness of breath and weakness  EXAM: CHEST  2 VIEW  COMPARISON:  04/21/2013  FINDINGS: Clips project over the neck from presumed thyroidectomy. Interstitial edema, trace effusions, and curvilinear bilateral lower lobe presumed atelectasis are again noted.  There has been interval increase in hazy predominantly dependent bilateral lower lobe airspace opacity. Cardiomegaly persists. Left axillary clips are noted.  IMPRESSION: Slight increase in bibasilar opacities likely progressive alveolar superimposed on interstitial pulmonary edema.   Electronically Signed   By: Christiana PellantGretchen  Green M.D.   On: 04/22/2013 16:32   Dg Chest 2 View  04/21/2013   CLINICAL DATA:  Leg swelling, history CHF, hypertension, diabetes, stroke  EXAM: CHEST  2 VIEW  COMPARISON:  04/19/2013  FINDINGS: Enlargement of cardiac silhouette with pulmonary vascular congestion.  Diffuse pulmonary infiltrates compatible pulmonary edema and CHF.  Scattered areas of bibasilar atelectasis.  More focal opacity or left lung base may represent atelectasis or increased edema but coexistent infection not excluded.  Small bibasilar effusions.  No pneumothorax.  Vascular stents in left  upper arm.  Retained contrast in colon.  IMPRESSION: Diffuse pulmonary infiltrates likely representing pulmonary edema/ CHF with scattered areas of atelectasis and small bilateral pleural effusions as above.   Electronically Signed   By: Lavonia Dana M.D.   On: 04/21/2013 14:40   Dg Chest Port 1 View  04/27/2013   CLINICAL DATA:  Increasing shortness of breath question fluid overload  EXAM: PORTABLE CHEST - 1 VIEW  COMPARISON:  Portable exam [id number] hr compared to 04/24/2013  FINDINGS: Enlargement of cardiac silhouette with pulmonary vascular congestion.  Diffuse interstitial infiltrates consistent with pulmonary edema.  Additional atelectasis versus consolidation left lower lobe.  No pleural effusion or pneumothorax.  IMPRESSION: CHF with atelectasis versus consolidation in left lower lobe.   Electronically Signed   By: Lavonia Dana M.D.   On: 04/27/2013 09:14   Dg Fluoro Guide Cv Line-no Report  04/28/2013   CLINICAL DATA: Gordy Councilman placement   FLOURO GUIDE CV LINE  Fluoroscopy was utilized by the requesting physician.  No  radiographic  interpretation.    Ir Kypho Vertebral Thoracic Augmentation  04/26/2013   CLINICAL DATA:  Prostate carcinoma. Multiple axial lesions on thoracic and lumbar MRI. Painful compression fracture deformity of T12.  EXAM: 1.  DEEP BONE BIOPSY THORACIC T12 UNDER FLUOROSCOPY  2.  KYPHOPLASTY AT THORACIC T12  TECHNIQUE: The procedure, risks (including but not limited to bleeding, infection, organ damage), benefits, and alternatives were explained to the patient. Questions regarding the procedure were encouraged and answered. The patient understands and consents to the procedure.  The patient was placed prone on the fluoroscopic table. The skin overlying the lower thoracic region was then prepped and draped in the usual sterile fashion. Maximal barrier sterile technique was utilized including caps, mask, sterile gowns, sterile gloves, sterile drape, hand hygiene and skin antiseptic.  Intravenous Fentanyl and Versed were administered as conscious sedation during continuous cardiorespiratory monitoring by the radiology RN, with a total moderate sedation time of40 minutes.  As antibiotic prophylaxis, !cefazolin 1 gram! was ordered pre-procedure and administered intravenously within !one hour! of incision.  The thoracic compression deformity of T12 was localized.The right pedicle at T12 was then infiltrated with 1% lidocaine followed by the advancement of a Kyphon trocar needle through the right pedicle into the posterior one-third. The trocar was removed and a coaxial core bone biopsy was obtained. Subsequently, The osteo drill was advanced to the anterior third of the vertebral body. The osteo drill was retracted. Through the working cannula, a Kyphon inflatable bone tamp 15 x 3 was advanced and positioned with the distal marker 5 mm from the anterior aspect of the cortex. Crossing of the midline was not seen on the AP projection. At this time, the balloon was expanded using contrast via a Kyphon inflation  syringe device via micro tubing.  In similar fashion, the left pedicle was infiltrated with 1% lidocaine followed by the advancement of a second Kyphon trocar needle through the left pedicle into the posterior third of the vertebral body. Coaxial core deep bone biopsy sample obtained. The osteo drill was coaxially advanced to the anterior right third. The osteo drill was exchanged for a Kyphon inflatable bone tamp 15x 3, advanced to the 5 mm of the anterior aspect of the cortex. The balloon was then expanded using contrast as above.  Inflations were continued until there was near apposition across the midline and approaching the superior and the inferior end plates.  At this time, methylmethacrylate mixture was reconstituted in the  Kyphon bone mixing device system. This was then loaded into the delivery mechanism, attached to Kyphon bone fillers.  The balloons were deflated and removed followed by the instillation of methylmethacrylate mixture with excellent filling in the AP and lateral projections. No extravasation was noted in the disk spaces or posteriorly into the spinal canal. No epidural venous contamination was seen.  The patient tolerated the procedure well. There were no acute complications. The working cannulae and the bone filler were then retrieved and removed.  IMPRESSION: 1. Deep bone core biopsy of thoracic T12 lesion. 2. Status post vertebral body augmentation using balloon kyphoplasty at thoracic T12 as described without event.   Electronically Signed   By: Arne Cleveland M.D.   On: 04/26/2013 18:14   US Thoracentesis Asp Pleural Space W/img Guide  04/29/2013   CLINICAL DATA:  Patient with history of dyspnea, CHF, bilateral pleural effusions, coronary artery disease, chronic kidney disease, prostate cancer ; request is made for left thoracentesis.  EXAM: ULTRASOUND GUIDED DIAGNOSTIC AND THERAPEUTIC LEFT THORACENTESIS  COMPARISON:  None.  FINDINGS: A total of approximately 780 cc's of yellow  fluid was removed. The fluid sample was sent for laboratory analysis.  IMPRESSION: Successful ultrasound guided diagnostic/therapeutic left thoracentesis yielding 780 cc's of pleural fluid.  Read by: Rowe Robert ,P.A.-C.  PROCEDURE: An ultrasound guided thoracentesis was thoroughly discussed with the patient and questions answered. The benefits, risks, alternatives and complications were also discussed. The patient understands and wishes to proceed with the procedure. Written consent was obtained.  Ultrasound was performed to localize and mark an adequate pocket of fluid in the left chest. The area was then prepped and draped in the normal sterile fashion. 1% Lidocaine was used for local anesthesia. Under ultrasound guidance a 19 gauge Yueh catheter was introduced. Thoracentesis was performed. The catheter was removed and a dressing applied.  Complications:  none   Electronically Signed   By: Maryclare Bean M.D.   On: 04/29/2013 11:11     CT Abdomen Pelvis Wo Contrast 05/09/13 IMPRESSION:  Multifocal hepatic lesions, measuring up to 3.6 cm, new from  02/28/2013. While poorly evaluated on unenhanced CT, this appearance  is suspicious for metastases (from a nonvisualized primary) or  possibly multifocal abscesses.  Moderate to large volume abdominopelvic ascites.  Apparent wall thickening along the ascending colon, although favored  to reflect stool/debris given essentially normal appearance on prior  CT.  Cardiomegaly with moderate bilateral pleural effusions.  Brachytherapy seeds in the prostate.  Additional stable ancillary findings as above.   ADMISSION DATA: H&P: Joshua [name] is a 63 year old male with a PMH of HTN, DM type 2, CAD (s/p MI and LAD stent), CKD s/p kidney transplantation, carotid artery stenosis (s/p CEA 08/2011), dHF (EF 45-50% Jan 2015), PVD (s/p fem-pop in 1980), hx of papillary thyroid cancer and prostate cancer (with possible mets to spine) who presents with complaint of edema and  dyspnea. He says has felt swollen all over for the past 3 days. He is on 2L oxygen at home and reports increasing dyspnea since his recent hospitalization (2 weeks ago). His wife says he sometimes needs to sleep sitting up. He denies PND, CP, palpitations, leg pain or blood loss. He reports compliance with medications.   Physical Exam:  Blood pressure 149/77, pulse 101, temperature 98.3 F (36.8 C), temperature source Oral, resp. rate 22, SpO2 100.00%.  General: resting in bed in NAD  HEENT: PERRL, EOMI  Cardiac: RRR, no rubs, murmurs or gallops  Pulm: clear to auscultation bilaterally, moving normal volumes of air  Abd: soft, nontender, nondistended, BS present  Ext: warm and well perfused, 1+ B/L lower extremity edema  Neuro: alert and oriented X3, cranial nerves II-XII grossly intact, 5/5 MMS   Lab results:  Basic Metabolic Panel:   Recent Labs   04/19/13 0025  04/21/13 1330   NA  140  138   K  4.2  4.5   CL  92*  93*   CO2  25  24   GLUCOSE  82  132*   BUN  44*  45*   CREATININE  2.48*  2.24*   CALCIUM  6.3*  6.1*    Liver Function Tests:   Recent Labs   04/19/13 0025  04/21/13 1330   AST  42*  38*   ALT  16  16   ALKPHOS  191*  163*   BILITOT  0.4  0.4   PROT  7.4  7.0   ALBUMIN  2.0*  1.8*    CBC:   Recent Labs   04/19/13 0025  04/21/13 1330   WBC  13.3*  13.3*   NEUTROABS  10.7*  10.8*   HGB  8.0*  7.1*   HCT  26.1*  22.9*   MCV  74.6*  74.4*   PLT  370  364    Cardiac Enzymes:   Recent Labs   04/21/13 1330   TROPONINI  <0.30    BNP:   Recent Labs   04/21/13 1330   PROBNP  29444.0*     HOSPITAL COURSE:  Multiple hepatic masses: During a long and complicated hospital course as below, abdominal distention was noted. CT 05/09/13 showed multifocal hepatic lesions, measuring up to 3.6 cm, new from 02/28/2013. While poorly evaluated on unenhanced CT, this appearance is suspicious for metastases. He has a history of papillary thyroid cancer and  prostate cancer with known likely bone mets (see below). Patient and wife are in agreement that they do not want to pursue a biopsy at this time after talking to Oncology in regards to his poor prognosis. The family is in agreement to pursue hospice care.   Acute on chronic systolic heart failure:  Joshua [name] presented with symptoms of volume overload secondary to worsening heart failure in the setting of chronic kidney disease, 13 years s/p renal transplant.  He initially had some improvement with IV Lasix but after about 1 week was diuresing poorly, BUN/creatinine began to rise and his dyspnea and edema returned.  ECHO revealed worsening function compared to 1 month prior.   Chest xray revealed worsening pulmonary edema, small bilateral pleural effusions and basilar opacities (left greater than right).  Given his history of CAD, worsening EF and pulmonary edema on chest xray there was concern for ischemia mediated heart failure.  Cardiology was consulted and felt that ischemia was likely not the cause of the patient's symptoms and that volume overload was the major problem.  Unfortunately, the patient was not a candidate for catheterization given his renal failure and history of GI bleed.  Given concern for cardiorenal syndrome, cardiology was asked to assess volume status and cardiac output.  Right heart catheterization was performed which revealed normal PCWP and normal CO.  His dry weight was estimated to be around 180 pounds.  Oral Lasix was resumed at that time in an effort to keep the patient euvolemic.  Diagnostic thoracentesis was also performed and pleural fluid analysis was consistent with a transudate, likely  secondary to heart failure and hypoalbuminemia.  The patient was continued on oral Lasix during his second week of admission, however his urine output decreased and he began to gain weight.  Nephrology recommended a trial of hemodialysis which the patient agreed to. He tolerated this well  initially but had low BP during subsequent HD and has decided to not continue this treatment at this time.   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  His baseline creatinine was 1.8-2.0 recently.  This admission his creatine went from 2.24 to 3.91.  Anti-rejection medications, mycophenolate and sirolimus were continued during admission.  Sirolimus dose was decreased from 2mg  daily to 1mg  daily due to supra-therapeutic level.  Given his worsening renal function and decreased urine output nephrology recommended resuming hemodialysis, which he tried for a few sessions but ultimately decided to stop in favor of hospice. Antirejection medications may be stopped while under residential hospice care.    Healthcare associated pneumonia:  His white blood cell count began to increase during admission and he developed a fever 1 week into his stay.  Given his dyspnea, fever, leukocytosis, immunosuppressed state and possible left lower lobe infiltrate on chest xray antibiotics were started for healthcare associated pneumonia.  Antibiotics were continued for 7 days and he remained afebrile with resolved leukocytosis.    Chest pain secondary to supraventricular tachycardia:  The patient experienced a few episodes of sustained SVT to the 140s with chest discomfort.  His chest pain resolved once he was back in sinus rhythm.  His troponins were negative and there were no EKG changes.  He was monitored on telemetry and metoprolol was continued.  Amiodarone was started at a lower dose, 200mg  twice daily because of its potential to interfere with sirolimus.  The patient was not felt to be a candidate for ablation. Amiodarone was decreased to 200mg  daily two weeks after starting (on 05/14/13).  We were unable to make any changes to Synthroid dose because TSH is not as helpful in the setting of acute illness. He had no further episodes of  SVT but did remain in sinus tachycardia rate 100-120s for several days prior to hospice decision was make and stopping telemetry. He was asymptomatic during this time.   Acute on chronic anemia:  The patient's baseline hemoglobin is 10-11.  He presented with hemoglobin 7.1.  Hbg ranged 6.8-8.5 and he received a total of 2 units of packed red blood cells during admission.  There was no evidence of bleeding and hemolysis work-up was negative.  One month ago, Ferritin level was 5064 with low Fe and TIBC consistent with anemia of chronic disease.  He got RBCs during dialysis 3/2. Goal hemoglobin 10-11. Patient has several oozing superficial skin wounds and thrombocytopenia, as below. Hgb 8.8 on discharge.  Thrombocytopenia - Platelets slowly declined during hospital stay. We stopped the Lovenox. Suspect thrombocytopenia is 2/2 reduced liver synthetic function given CT findings. Sequela of superficial bleeding on exam. SCDs were used for DVT prevention.  Platelets  Date Value Ref Range Status  05/14/2013 48* 150 - 400 K/uL Final     REPEATED TO VERIFY     CONSISTENT WITH PREVIOUS RESULT  05/13/2013 58* 150 - 400 K/uL Final     REPEATED TO VERIFY     CONSISTENT WITH PREVIOUS RESULT  05/12/2013 65* 150 - 400 K/uL Final     REPEATED TO VERIFY  CONSISTENT WITH PREVIOUS RESULT  05/12/2013 PLATELET CLUMPS NOTED ON SMEAR  150 - 400 K/uL Final  05/11/2013 73* 150 - 400 K/uL Final     CONSISTENT WITH PREVIOUS RESULT  05/10/2013 88* 150 - 400 K/uL Final     CONSISTENT WITH PREVIOUS RESULT  05/09/2013 PLATELET CLUMPS NOTED ON SMEAR, UNABLE TO ESTIMATE  150 - 400 K/uL Final  05/09/2013 PLATELET CLUMPS NOTED ON SMEAR, COUNT APPEARS DECREASED  150 - 400 K/uL Final     LARGE PLATELETS PRESENT    Hypocalcemia:  The patient's calcium was 6.1 on admission, likely due renal failure.  With albumin 1.8, corrected calcium was 7.9.  Ionized calcium was also low, 0.71.  He was supplemented with both IV and po calcium as needed  with resolution as below.  Calcium  Date Value Ref Range Status  05/14/2013 8.9  8.4 - 10.5 mg/dL Final  05/13/2013 8.8  8.4 - 10.5 mg/dL Final  05/12/2013 8.7  8.4 - 10.5 mg/dL Final  05/12/2013 8.2* 8.4 - 10.5 mg/dL Final  05/11/2013 8.1* 8.4 - 10.5 mg/dL Final    Diabetes mellitus type 2:  The patient was previously on 24 units of Lantus and 6 units of Novolog TID (for CBG > 280) at home, but his wife reported hypoglycemic episodes, so she was administering 12 units over the past few days prior to admission.  We started with Lantus 15 units qHS and SSI.  Lantus was gradually increased to 20 units qHS as the patients po intake and blood glucose increased. After we started dialysis, this was halved to 10 units each bedtime due to an episode of hypoglycemia.   Hypertension:  Lopressor and Lasix were continued during admission.  Isosorbide mononitrate was switched to isosorbide dinitrate and hydralazine was added for heart failure benefits, however the hydralazine was ultimately discontinued due to hypotension.  Diltiazem was stopped as it is not generally recommended in heart failure.  Metoprolol was decreased from 100 twice a day to 25 twice a day during his trial dialysis due to soft pressures. This was increased to 50 mg twice a day at discharge due to sinus tachycardia. His isosorbide was discontinued on 3/8 due to persistent BP in the 80s/50s and his BP remained stable. He was normotensive upon discharge.  History of prostate cancer with T10 lesion noted on prior imaging:  The patient had radiation treatment for prostate cancer about 5 years ago.  He has had a several month history of low back pain and lesions were reportedly seen on MRI (possibly at outside institution as there is no MRI in [facility]).  However, he had a negative bone scan in in 02/2013.  The patient follows with Urologist, Dr. Diona Fanti and was to have have T10 biopsy and kyphoplasty as an outpatient on 02/20.  Since the patient was  hospitalized, this procedure was completed while admitted on 02/18.  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  Given the necrosis, Pathology could not perform stains to better identify the cells.  His back pain was controlled with oxycodone, transitioned to Dilaudid IV when he stopped tolerating po. His urologist called him and his wife while he was here, and suggested androgen deprivation with Mills Koller shots which were started 3/2. Side effects were discussed with the patient and his wife. This can be stopped given prognosis.  Unstageable pressure ulcer, sacral: The patient was aware of ulcer prior to admission.  Wound care consulted during admission and recommended  twice daily dressing changes and chemical debridement with Santyl.  The patient was also provided with a pressure redistribution pad.  Severe malnutrition in the context of chronic illness:  The patient's appetite improved at first but declined days prior to his discharge to residential hospice. He was not tolerating po at discharge. Wife would not like to pursue a feeding tube.  Hyperkalemia - K of 5.1 on 3/8, but HD was stopped. We did not treat based on comfort care approach.  DISCHARGE DATA: Vital Signs: BP 120/74  Pulse 112  Temp(Src) 97.6 F (36.4 C) (Oral)  Resp 18  Ht 5\' 11"  (1.803 m)  Wt 171 lb 15.3 oz (78 kg)  BMI 23.99 kg/m2  SpO2 97%  Labs: Results for orders placed during the hospital encounter of 04/21/13 (from the past 24 hour(s))  GLUCOSE, CAPILLARY     Status: Abnormal   Collection Time    05/14/13  4:08 PM      Result Value Ref Range   Glucose-Capillary 156 (*) 70 - 99 mg/dL  GLUCOSE, CAPILLARY     Status: Abnormal   Collection Time    05/14/13  9:59 PM      Result Value Ref Range   Glucose-Capillary 111 (*) 70 - 99 mg/dL  GLUCOSE, CAPILLARY     Status: None   Collection Time    05/15/13  6:07 AM      Result Value Ref Range   Glucose-Capillary 98  70  - 99 mg/dL  GLUCOSE, CAPILLARY     Status: Abnormal   Collection Time    05/15/13 11:24 AM      Result Value Ref Range   Glucose-Capillary 113 (*) 70 - 99 mg/dL   Comment 1 Notify RN       Services Ordered on Discharge: Y = Yes; Blank = No PT:   OT:   RN:   Equipment:   Other:      Time Spent on Discharge: 35 min   Signed: Lesly Dukes PGY 2, Internal Medicine Resident 05/15/2013, 12:42 PM

## 2013-05-09 NOTE — Progress Notes (Signed)
Per CM, patient will be going to [facility] when there is a bed available. CSW signing off at this time. Please re consult if social work needs arise.  Jeanette Caprice, MSW, [place]

## 2013-05-09 NOTE — Progress Notes (Signed)
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 suggestive of malignancy  6 Paroxsymal atrial tach- on amio, MTP  7 HTN- on MTP  8 Anemia. S/p PRBCs  Subjective: Interval History: Some vomiting this am  Objective: Vital signs in last 24 hours: Temp:  [97.5 F (36.4 C)-98.3 F (36.8 C)] 97.7 F (36.5 C) (03/03 0422) Pulse Rate:  [53-126] 126 (03/03 0422) Resp:  [17-20] 20 (03/03 0422) BP: (87-142)/(54-87) 117/73 mmHg (03/03 0422) SpO2:  [92 %-98 %] 92 % (03/03 0422) Weight:  [79.7 kg (175 lb 11.3 oz)-82.4 kg (181 lb 10.5 oz)] 79.7 kg (175 lb 11.3 oz) (03/03 0643) Weight change: -2.1 kg (-4 lb 10.1 oz)  Intake/Output from previous day: 03/02 0701 - 03/03 0700 In: 910 [P.O.:240; Blood:670] Out: 2448  Intake/Output this shift:    General appearance: alert and cooperative GI: ascites Extremities: edema tr  Lab Results:  Recent Labs  05/08/13 0344 05/09/13 0655  WBC 11.9* 11.2*  HGB 7.1* 13.9  HCT 23.0* 42.5  PLT 137* PENDING   BMET:  Recent Labs  05/08/13 0344 05/09/13 0655  NA 136* 141  K 4.0 4.9  CL 91* 91*  CO2 25 26  GLUCOSE 171* 31*  BUN 41* 29*  CREATININE 2.79* 2.53*  CALCIUM 7.8* 8.7   No results found for this basename: PTH,  in the last 72 hours Iron Studies: No results found for this basename: IRON, TIBC, TRANSFERRIN, FERRITIN,  in the last 72 hours Studies/Results: No results found.  Scheduled: . albuterol  2.5 mg Nebulization Q6H  . amiodarone  200 mg Oral BID  . antiseptic oral rinse  15 mL Mouth Rinse BID  . aspirin EC  81 mg Oral Daily  . atorvastatin  80 mg Oral q1800  . calcium acetate  2,001 mg Oral TID WC  . calcium carbonate  800 mg of elemental calcium Oral QHS   . collagenase   Topical Daily  . enoxaparin (LOVENOX) injection  30 mg Subcutaneous Q24H  . feeding supplement (GLUCERNA SHAKE)  237 mL Oral BID BM  . insulin aspart  0-9 Units Subcutaneous TID WC  . insulin glargine  20 Units Subcutaneous QHS  . isosorbide dinitrate  20 mg Oral BID  . levothyroxine  150 mcg Oral QAC breakfast  . metoprolol  50 mg Oral BID  . multivitamin with minerals  1 tablet Oral Daily  . mycophenolate  720 mg Oral BID  . pantoprazole  40 mg Oral Daily  . sirolimus  1 mg Oral q morning - 10a     LOS: 18 days   Joshua [name] C 05/09/2013,9:18 AM

## 2013-05-09 NOTE — Progress Notes (Addendum)
I met with Joshua [name] to discuss the findings of Joshua CT abdomen/pelvis in regards to the liver lesions, ascites, and options to pursue further diagnostic testing or not. Initially they were both interested in pursuing further diagnostic testing of these lesions as they understood that these could be metastatic cancer, perhaps prostate cancer. Joshua [name] expressed the desire to further discuss Joshua overall health with Joshua [name] before letting the medical team know in the morning if he wants further diagnostic testing. I offered other options to enhance Joshua care including a referral to Palliative Services and Chaplain services but they wanted to talk amongst themselves and be alone for a while before getting these services. I offered my support on their decision to wait until tomorrow morning to decide on all these things. They expressed gratitude for being allowed to take the time to think about these new findings and Joshua [name]'s prognosis.

## 2013-05-09 NOTE — Progress Notes (Signed)
Hypoglycemic Event  CBG: *39  Treatment: oral fluids  Symptoms: no  Follow-up CBG: Time:0710 CBG Result:*49     Pt Given 50% Dextrose will Recheck and Continue to monitor.   Joshua [name] D  Remember to initiate Hypoglycemia Order Set & complete

## 2013-05-10 ENCOUNTER — Other Ambulatory Visit: Payer: Self-pay

## 2013-05-10 DIAGNOSIS — D696 Thrombocytopenia, unspecified: Secondary | ICD-10-CM

## 2013-05-10 LAB — BASIC METABOLIC PANEL
BUN: 39 mg/dL — AB (ref 6–23)
CHLORIDE: 91 meq/L — AB (ref 96–112)
CO2: 25 meq/L (ref 19–32)
CREATININE: 3.03 mg/dL — AB (ref 0.50–1.35)
Calcium: 8 mg/dL — ABNORMAL LOW (ref 8.4–10.5)
GFR calc Af Amer: 24 mL/min — ABNORMAL LOW (ref >90)
GFR calc non Af Amer: 21 mL/min — ABNORMAL LOW (ref >90)
Glucose, Bld: 139 mg/dL — ABNORMAL HIGH (ref 70–99)
Potassium: 4.4 mEq/L (ref 3.7–5.3)
Sodium: 136 mEq/L — ABNORMAL LOW (ref 137–147)

## 2013-05-10 LAB — CBC
HEMATOCRIT: 31 % — AB (ref 39.0–52.0)
Hemoglobin: 10 g/dL — ABNORMAL LOW (ref 13.0–17.0)
MCH: 25.8 pg — ABNORMAL LOW (ref 26.0–34.0)
MCHC: 32.3 g/dL (ref 30.0–36.0)
MCV: 79.9 fL (ref 78.0–100.0)
Platelets: 88 10*3/uL — ABNORMAL LOW (ref 150–400)
RBC: 3.88 MIL/uL — AB (ref 4.22–5.81)
RDW: 21.4 % — ABNORMAL HIGH (ref 11.5–15.5)
WBC: 10 10*3/uL (ref 4.0–10.5)

## 2013-05-10 LAB — GLUCOSE, CAPILLARY
Glucose-Capillary: 152 mg/dL — ABNORMAL HIGH (ref 70–99)
Glucose-Capillary: 115 mg/dL — ABNORMAL HIGH (ref 70–99)
Glucose-Capillary: 123 mg/dL — ABNORMAL HIGH (ref 70–99)

## 2013-05-10 LAB — TROPONIN I: Troponin I: <0.3 ng/mL (ref <0.30)

## 2013-05-10 MED ORDER — OXYCODONE HCL 5 MG PO TABS
10.0000 mg | ORAL_TABLET | ORAL | Status: DC | PRN
  Administered 2013-05-10 – 2013-05-13 (×8): 10 mg via ORAL
  Filled 2013-05-10 (×7): qty 2

## 2013-05-10 MED ORDER — PENTAFLUOROPROP-TETRAFLUOROETH EX AERO: 1.0000 "application " | INHALATION_SPRAY | CUTANEOUS | Status: DC | PRN

## 2013-05-10 MED ORDER — LIDOCAINE-PRILOCAINE 2.5-2.5 % EX CREA: 1.0000 "application " | TOPICAL_CREAM | CUTANEOUS | Status: DC | PRN

## 2013-05-10 MED ORDER — SODIUM CHLORIDE 0.9 % IV SOLN: 100.0000 mL | INTRAVENOUS | Status: DC | PRN

## 2013-05-10 MED ORDER — SODIUM CHLORIDE 0.9 % IV SOLN
100.0000 mL | INTRAVENOUS | Status: DC | PRN
Start: 2013-05-10 — End: 2013-05-12

## 2013-05-10 MED ORDER — HEPARIN SODIUM (PORCINE) 1000 UNIT/ML DIALYSIS: 20.0000 [IU]/kg | INTRAMUSCULAR | Status: DC | PRN

## 2013-05-10 MED ORDER — ALTEPLASE 2 MG IJ SOLR: 2.0000 mg | Freq: Once | INTRAMUSCULAR | Status: AC | PRN

## 2013-05-10 MED ORDER — LIDOCAINE HCL (PF) 1 % IJ SOLN: 5.0000 mL | INTRAMUSCULAR | Status: DC | PRN

## 2013-05-10 MED ORDER — METOPROLOL TARTRATE 1 MG/ML IV SOLN
2.5000 mg | Freq: Once | INTRAVENOUS | Status: AC
  Administered 2013-05-10: 2.5 mg via INTRAVENOUS
  Filled 2013-05-10: qty 5

## 2013-05-10 MED ORDER — METOPROLOL TARTRATE 100 MG PO TABS
100.0000 mg | ORAL_TABLET | Freq: Two times a day (BID) | ORAL | Status: DC
  Filled 2013-05-10 (×4): qty 1

## 2013-05-10 MED ORDER — HEPARIN SODIUM (PORCINE) 1000 UNIT/ML DIALYSIS: 2000.0000 [IU] | INTRAMUSCULAR | Status: DC | PRN

## 2013-05-10 MED ORDER — NEPRO/CARBSTEADY PO LIQD: 237.0000 mL | ORAL | Status: DC | PRN

## 2013-05-10 MED ORDER — HEPARIN SODIUM (PORCINE) 1000 UNIT/ML DIALYSIS: 1000.0000 [IU] | INTRAMUSCULAR | Status: DC | PRN

## 2013-05-10 MED ORDER — LIDOCAINE HCL (PF) 1 % IJ SOLN
5.0000 mL | INTRAMUSCULAR | Status: DC | PRN
Start: 2013-05-10 — End: 2013-05-12

## 2013-05-10 NOTE — Progress Notes (Addendum)
Subjective: Patient seen and examined at the bedside this morning. He had an episode of chest pain overnight associated with sinus tachycardia. He denies chest pain or palpitations this morning. Telemetry shows sinus tachycardia, rate 120s.  Yesterday his CT showed liver masses. Dr. Hayes Ludwig met with the patient and his wife yesterday. He took the night to decide on whether he wants to pursue diagnostic testing vs. Goals of care discussion with palliative care. He and his wife are in agreement that they do not think they want a biopsy at this time. They would like to talk to a Palliative Medicine specialist about goals of care and where to go from here given his many chronic medical problems, severe deconditioning, and now likely metastatic cancer. They are most interested in preventing further problems and discomfort.   Objective: Vital signs in last 24 hours: Filed Vitals:   05/10/13 0400 05/10/13 0500 05/10/13 0600 05/10/13 0912  BP: 96/61  107/70   Pulse: 122  120   Temp: 98.7 F (37.1 C)     TempSrc: Oral     Resp: 20     Height:      Weight:  182 lb 15.7 oz (83 kg)    SpO2: 98%   96%   Weight change: 1 lb 5.2 oz (0.6 kg) No intake or output data in the 24 hours ending 05/10/13 0958  General: Resting in bed in NAD, occasional twitching of his bilateral shoulder/arm, cachectic Eyes:  Jaundiced Cardiac: Tachycardia with regular rhythm, no rubs, murmur or gallops  Pulm: CTAB Abd: soft, abdominal distention with no tenderness to palpation, no guarding, BS present Ext: warm and well perfused, no lower extremity edema Neuro: alert and oriented X3, follows commands appropriately, moves all 4 extremities voluntairly  Lab Results: Basic Metabolic Panel:  Recent Labs Lab 05/04/13 0244  05/09/13 0655 05/10/13 0332  NA 136*  < > 141 136*  K 4.5  < > 4.9 4.4  CL 89*  < > 91* 91*  CO2 24  < > 26 25  GLUCOSE 162*  < > 31* 139*  BUN 75*  < > 29* 39*  CREATININE 3.45*  < > 2.53*  3.03*  CALCIUM 6.5*  < > 8.7 8.0*  PHOS 6.0*  --   --   --   < > = values in this interval not displayed. Liver Function Tests:  Recent Labs Lab 05/08/13 0344 05/09/13 0655  AST 54* 137*  ALT 18 38  ALKPHOS 450* 586*  BILITOT 0.5 0.7  PROT 6.4 7.6  ALBUMIN 1.7* 2.1*   CBC:  Recent Labs Lab 05/09/13 1700 05/10/13 0332  WBC 12.0* 10.0  HGB 10.5* 10.0*  HCT 32.2* 31.0*  MCV 79.3 79.9  PLT PLATELET CLUMPS NOTED ON SMEAR, UNABLE TO ESTIMATE 88*   Cardiac Enzymes:  Recent Labs Lab 05/07/13 2305 05/10/13 0332  TROPONINI <0.30 <0.30   CBG:  Recent Labs Lab 05/09/13 0656 05/09/13 0720 05/09/13 0759 05/09/13 1128 05/09/13 1617 05/09/13 2124  GLUCAP 51* 49* 102* 116* 80 88   Thyroid Function Tests: No results found for this basename: TSH, T4TOTAL, FREET4, T3FREE, THYROIDAB,  in the last 168 hours Anemia Panel: No results found for this basename: VITAMINB12, FOLATE, FERRITIN, TIBC, IRON, RETICCTPCT,  in the last 168 hours Micro Results: No results found for this or any previous visit (from the past 240 hour(s)). Studies/Results: Ct Abdomen Pelvis Wo Contrast  05/09/2013   CLINICAL DATA:  Abdominal distention, prostate cancer, kidney transplant. Ascites.  EXAM: CT ABDOMEN AND PELVIS WITHOUT CONTRAST  TECHNIQUE: Multidetector CT imaging of the abdomen and pelvis was performed following the standard protocol without intravenous contrast.  COMPARISON:  02/28/2013  FINDINGS: Motion degraded images.  Cardiomegaly.  Moderate bilateral pleural effusions. Associated lower lobe atelectasis.  Unenhanced liver is poorly evaluated but notable for at least 3 dominant lesions in the right hepatic lobe, new, as follows:  --3.6 x 3.3 cm hypodense lesion in the posterior segment right hepatic lobe (series 2/ image 30)  --2.1 x 2.3 cm hypodense lesion in the posterior segment right hepatic lobe (series 2/ image 34)  --3.0 x 2.8 cm hypodense lesion in the inferior right hepatic lobe (series  2/image 38)  Additional ill-defined lesions are expected throughout the remaining liver. None of these lesions are visualized even in retrospect on prior CT. Primary differential considerations include metastases (from a nonvisualized primary) or possibly multifocal abscesses.  Unenhanced spleen is mildly heterogeneous with to possible hypodense posterior splenic lesions (series 2/ images 21 and 23), equivocal.  Pancreas and adrenal glands are within normal limits.  Gallbladder is distended with a tiny layering gallstone (series 2/ image 35). No associated inflammatory changes by CT. No intrahepatic or extrahepatic ductal dilatation.  Bilateral negative renal atrophy with cortical calcifications. 1.5 cm lesion in the posterior right upper pole (series 2/ image 31) and 1.8 cm lesion in the posterior left lower pole (series 2/ image 40), which does not measure simple fluid density and cannot be characterized as simple on unenhanced CT. Right lower quadrant renal transplant without hydronephrosis.  No evidence of bowel obstruction. Apparent wall thickening along the ascending colon (coronal image 45), favored to reflect stool/debris given essentially normal appearance on prior CT. Normal appendix.  Atherosclerotic calcifications of the abdominal aorta and branch vessels.  Moderate to large volume abdominopelvic ascites, new from prior CT.  No suspicious abdominopelvic lymphadenopathy.  Brachytherapy seeds in the prostate.  Bladder is unremarkable.  Fluid within a small to moderate fat containing right mid abdominal ventral hernia (series 2/image 55).  Surgical clips in the bilateral inguinal regions.  Degenerative changes of the visualized thoracolumbar spine. Interval vertebral augmentation at L1 (sagittal image 57).  IMPRESSION: Multifocal hepatic lesions, measuring up to 3.6 cm, new from 02/28/2013. While poorly evaluated on unenhanced CT, this appearance is suspicious for metastases (from a nonvisualized primary)  or possibly multifocal abscesses.  Moderate to large volume abdominopelvic ascites.  Apparent wall thickening along the ascending colon, although favored to reflect stool/debris given essentially normal appearance on prior CT.  Cardiomegaly with moderate bilateral pleural effusions.  Brachytherapy seeds in the prostate.  Additional stable ancillary findings as above.   Electronically Signed   By: Julian Hy M.D.   On: 05/09/2013 16:19   Medications: I have reviewed the patient's current medications. Scheduled Meds: . albuterol  2.5 mg Nebulization TID  . amiodarone  200 mg Oral BID  . antiseptic oral rinse  15 mL Mouth Rinse BID  . aspirin EC  81 mg Oral Daily  . atorvastatin  80 mg Oral q1800  . calcium acetate  2,001 mg Oral TID WC  . calcium carbonate  800 mg of elemental calcium Oral QHS  . collagenase   Topical Daily  . enoxaparin (LOVENOX) injection  30 mg Subcutaneous Q24H  . feeding supplement (GLUCERNA SHAKE)  237 mL Oral BID BM  . insulin aspart  0-9 Units Subcutaneous TID WC  . insulin glargine  10 Units Subcutaneous QHS  . isosorbide dinitrate  20 mg Oral BID  . levothyroxine  150 mcg Oral QAC breakfast  . metoprolol  100 mg Oral BID  . multivitamin with minerals  1 tablet Oral Daily  . mycophenolate  720 mg Oral BID  . pantoprazole  40 mg Oral Daily  . sirolimus  1 mg Oral q morning - 10a   Continuous Infusions: . sodium chloride 12.5 mL/hr at 05/01/13 0849   PRN Meds:.sodium chloride, sodium chloride, sodium chloride, sodium chloride, acetaminophen, alteplase, alteplase, calamine, diphenhydrAMINE, feeding supplement (NEPRO CARB STEADY), heparin, heparin, heparin, [START ON 05/11/2013] heparin, lidocaine (PF), lidocaine (PF), lidocaine-prilocaine, lidocaine-prilocaine, oxyCODONE, pentafluoroprop-tetrafluoroeth, pentafluoroprop-tetrafluoroeth  Assessment/Plan: 63 year old male with a PMH of HTN, DM2, CAD (s/p MI and LAD stent), kidney transplantation, carotid artery  stenosis (s/p CEA 08/2011), PVD (s/p fem-pop in 1980), dHF (EF 45-50% Jan 2015), hx of papillary thyroid cancer and prostate cancer who presents with complaint of edema x 3 days and dyspnea for several weeks.   #New liver masses on CT - CT 05/09/13 showed multifocal hepatic lesions, measuring up to 3.6 cm, new from 02/28/2013. While poorly evaluated on unenhanced CT, this appearance is suspicious for metastases. He has a history of papillary thyroid cancer and prostate cancer with known likely bone mets (see below). Patient and wife are in agreement that they do not want to pursue a biopsy at this time and would like to talk about goals of care with a palliative specialist.  - Palliative care consult has been placed - We also discussed therapeutic paracentesis given distention, but they are not interested at this time  #Tachyarrhythmias - Chest pain again overnight associated with sinus tachycardia to the 120-130s. Chest pain improved with pain control but HR still elevated s/p IV Lopressor 2.60m. Patient currently asymptomatic. EP has seen him already this admission regarding episodes of SVT and determined that at this time he is a poor candidate for ablation. - Continue telemetry - Trending troponins, neg x1 - Continue metoprolol, increase back to prior dose of 1071mBID - Continue amiodarone 20048mID; plan decrease to 200m72mily after 2 weeks of therapy (~05/13/13); low dose was started because there is potential for interaction with sirolimus - IV lopressor prn  - Will not adjust Synthroid yet as amio may make him hyper or more hypo.   #Volume overload - Was likely 2/2 worsening CHF in the setting of CKD about 13ys s/p renal transplant. CXR on presentation with small bilateral pleural effusions. Right heart cath on 02/20 revealed normal PCWP and normal CO. Dry weight appears to be 180 pounds. He has undergone HD x3, weight decreased from 188lb > 179 lbs. His O2 saturation is stable at 93-98% on 3L.  He still has abdominal distention, likely from liver pathology given CT results, but his lower extremity edema has resolved. - Appreciate Nephrology's recommendations, will resume MWF dialysis schedule today - Continue Lopressor 100mg59m, isordil 40mg 10m - Daily weights and I&Os > +120L yesterday > -10.5L since admission - Plan was for d/c to [facility] but first we must clarify goals of care  #B/L pleural effusions - Antibiotics were started on 02/20 and his dyspnea has improved. Thoracentesis completed 02/22 and yielded 780cc of yellow fluid. Analysis consistent with transudate likely 2/2 to heart failure and hypoalbuminemia. Pleural fluid culture negative.  - Oxygen supplementation prn  - Incentive spirometer  - Albuterol nebs q6h   #HCAP, resolved - Fevers, leukocytosis and possible LLL infiltrate on admission CXR in an immunosuppressed patient.  Antibiotic treatment completed. No recent fevers and no leukocytosis. Final blood cultures - no growth.  - Oxygen supplementation prn  - Albuterol nebs prn q6h   #CKD stage 3/4 - Pt ~13 years s/p renal transplant. Baseline Cr 1.9-2.5. Had been stable around 2.8 but began to increase last week and patient developed oliguria. Renal was re-consulted who decided to do a trial of HD x3 days, which he completed 3/2. He appears to be improving in terms of confusion and myoclonus, which were likely secondary to uremia. - Continue anti-rejection meds, will monitor for recovery of the transplanted kidney, but likely this will not occur per renal; nephrology plans to taper these meds over the next 6-12 months to prevent graft-versus-host disease - Continue HD per renal, MWF  Creatinine, Ser  Date Value Ref Range Status  05/10/2013 3.03* 0.50 - 1.35 mg/dL Final  05/09/2013 2.53* 0.50 - 1.35 mg/dL Final  05/08/2013 2.79* 0.50 - 1.35 mg/dL Final  05/07/2013 3.16* 0.50 - 1.35 mg/dL Final  05/06/2013 3.91* 0.50 - 1.35 mg/dL Final    #Acute on chronic anemia -   Likely anemia of chronic disease. He got RBCs during dialysis 3/2. Hemoglobin increased more than expected, from 7.1 to 13.9. This may have been an error as Hgb 10.5 on re-check. Goal hemoglobin 10-11. - Continue to monitor  Hemoglobin  Date Value Ref Range Status  05/10/2013 10.0* 13.0 - 17.0 g/dL Final  05/09/2013 10.5* 13.0 - 17.0 g/dL Final     DELTA CHECK NOTED  05/09/2013 13.9  13.0 - 17.0 g/dL Final     REPEATED TO VERIFY     DELTA CHECK NOTED     POST TRANSFUSION SPECIMEN  05/08/2013 7.1* 13.0 - 17.0 g/dL Final  05/07/2013 7.1* 13.0 - 17.0 g/dL Final    #Thrombocytopenia - Platelets 88 today. He is on Lovenox. Could also be 2/2 reduced liver synthetic function given CT findings. Trend below. - Continue to monitor for signs and symptoms of bleeding - CBC in am  Platelets  Date Value Ref Range Status  05/10/2013 88* 150 - 400 K/uL Final     CONSISTENT WITH PREVIOUS RESULT  05/09/2013 PLATELET CLUMPS NOTED ON SMEAR, UNABLE TO ESTIMATE  150 - 400 K/uL Final  05/09/2013 PLATELET CLUMPS NOTED ON SMEAR, COUNT APPEARS DECREASED  150 - 400 K/uL Final     LARGE PLATELETS PRESENT  05/08/2013 137* 150 - 400 K/uL Final  05/07/2013 135* 150 - 400 K/uL Final     PLATELET COUNT CONFIRMED BY SMEAR    #Hypocalcemia - 6.3 on admission. Ionized Calcium low at 0.71-->0.79. Improving with supplementation. - Replacing Calcium with TUMS and PhosLo  Calcium  Date Value Ref Range Status  05/10/2013 8.0* 8.4 - 10.5 mg/dL Final  05/09/2013 8.7  8.4 - 10.5 mg/dL Final  05/08/2013 7.8* 8.4 - 10.5 mg/dL Final  05/07/2013 7.5* 8.4 - 10.5 mg/dL Final  05/06/2013 7.0* 8.4 - 10.5 mg/dL Final    #DM type 2 - No further episodes of hypoglycemia since decreasing Lantus. - Continue Lantus 10 units qHS  - SSI sensitive  - Checking CBGs AC and QHS  Glucose-Capillary  Date Value Ref Range Status  05/09/2013 88  70 - 99 mg/dL Final  05/09/2013 80  70 - 99 mg/dL Final  05/09/2013 116* 70 - 99 mg/dL Final  05/09/2013 102* 70 - 99  mg/dL Final  05/09/2013 49* 70 - 99 mg/dL Final    #HTN - Stable. Currently normotensive. - Continued Lopressor, isordil -  D/c hydralazine, lasix  #H/o prostate cancer with T10 lesion noted on prior MRI - T10 biopsy done inpatient on 02/18 - reveals necrosis and the appearance of "Ghost cells" that are likely malignant.  - Continue Oxycodone IR 85m q 4h PRN and Tylenol for back pain - Appreciate urology recommendations, starting Firmagon injections 240 mg subcutaneous on 3/2  #Unstageable pressure ulcer, sacral - Patient aware of ulcer prior to admission. Wound care consulted and recommendations appreciated.  - Twice daily cleanse with normal saline and pat dry, debridement with Santyl, moist NS dressing (pack into wound), secure with dry 4x4 and tape  - Pressure redistribution pad   #Severe malnutrition in the context of chronic illness - Continue Glucerna shakes, multivitamin.   #DVT ppx - SCDs and lovenox   Dispo: Disposition is deferred at this time, awaiting improvement of current medical problems.  Anticipated discharge in approximately  day(s).   The patient does have a current PCP (TSueanne Margarita MD) and [facility].  The patient does not have transportation limitations that hinder transportation to clinic appointments.  .Services Needed at time of discharge: Y = Yes, Blank = No PT:   OT:   RN:   Equipment:   Other:     LOS: 19 days   SLesly Dukes MD 05/10/2013, 9:58 AM

## 2013-05-10 NOTE — Progress Notes (Signed)
S:  Paged by nurse around 1AM who reports patient w/ tachy to the 130s and reporting chest pain.  EKG - without significant changes.  Went to see patient.  Wife is present and reports she attempted to wake patient and he was would not wake up.  She said this went on for a few minutes until nursing staff arrived and he woke up.  Once he woke he reported chest pain.  Pain is central and right sided, 8/10.  His pain gradually improved during exam.  Denies SOB.  O: Telemetry review - sinus tachycardia 120s Vitals reviewed - BP 114/59, HR 123, SpO2 100% on 5L via [place] General:  AAO in bed in NAD Cardiac:  Regular rhythm, tachycardic, no r/m/g; chest TTP Lungs:  CTA B/L Abdomen:  Distended, +BS Neuro:  Able to move all four extremities, responding appropriately to questions  A/P:  63 year old male with multiple co-morbidities including renal failure s/p transplant now requiring HD and tachyarrhythmias.  He has been sinus tachy for several hours now with c/o chest pain.  No EKG changes and chest pain improving.  He takes Oxycodone q4h for chronic back pain but has not had this medication in over 12 hours, apparently due to low pressures.  Pain may be contributing to tachy.  Oxycodone was given and the patient was noted to be resting comfortably and chest pain resolved however still sinus tachy to 120s. - IV Lopressor 2.5mg   - CE x 3 - will continue to monitor  Duwaine Maxin DO

## 2013-05-10 NOTE — Procedures (Signed)
On dialysis.  Ascites obvious. Tolerating HD. CT scan unfortunately revealed liver masses. Pt wants to shorten treatment time today.  Of note, pt does have a history of thyroid cancer and a RAI test for distant metastasis may be reasonable and noninvasive. Truda Staub C

## 2013-05-10 NOTE — Progress Notes (Signed)
Paged the on call MD and updated about pts. HR remains ST 120's bp stable denies any pain at present will cont. To  Monitor as ordered.

## 2013-05-10 NOTE — Progress Notes (Signed)
Pts. Vitals taken BP  114/69 MAP of 77 HR 122 remains tachy , pt. Denies any chest pain or discomfort at present after the dose of oxycodone an hour ago. Dr. Redmond Pulling made aware of pts. VS and pain level with orders made.

## 2013-05-10 NOTE — Progress Notes (Signed)
Pt on dialysis Tx. Started with a soft BP. Goal was decreased and BP continued to drop. Pt taken out of UF and BP improved, however HR increased to 140 and was going in and out of AF vs ST. Dr. Florene Glen called and Tx was aborted. Pt ran 1hr of his hemodialysisTx.

## 2013-05-10 NOTE — Progress Notes (Signed)
Pt. Complained of chest pain described at scale of 8/10. Remains tachycardic. VS taken denies any short of breath,  12 lead EKG done shows sinus tachy , Dr. Redmond Pulling made aware  Will  see the pt. Continue to monitor and emotional support given to wife and pt.

## 2013-05-10 NOTE — Progress Notes (Signed)
  Date: 05/10/2013  Patient name: Joshua [name]  Medical record number: [id number]  Date of birth: 09-14-1950   This patient has been seen and the plan of care was discussed with the house staff. Please see their note for complete details. I concur with their findings with the following additions/corrections: Noted ST on tele with episode of CP overnight. Trop negative. He is currently CP free. Discussed CT abdomen findings ordered by nephrology to his wife. Discussed possibility of metastatic disease to liver. I don't think these are abscesses or infectious process. He has no fever. He has no leukocytosis this morning. He's already had BC this admission that are negative and finalized. He was treated for HCAP and completed course of abx tx.  At this time, both the patient and wife are interested in pursuing palliative care options. They are not interested in paracentesis or further diagnostics.  At this time, given his tachycardia, he has been evaluated by EP this visit. He is a poor candidate for ablation. Will need to increase BB as he tolerates. Drops in his BP may be volume dependent (UF, etc).  Goals of care meeting today.  Dominic Pea, DO, [facility] 05/10/2013, 2:10 PM

## 2013-05-10 NOTE — Progress Notes (Signed)
Thank you for consulting the Palliative Medicine Team at [facility] to meet your patient's and family's needs.   The reason that you asked [place] to see your patient is  For GOC  We have scheduled your patient for a meeting: 3/5 11 am  The Surrogate decision make is: [name], spouse Contact information: [phone]  Other family members that need to be present:  At their discretion    Your patient is able/unable to participate: yes  Additional Narrative:     Joshua Vanhouten L. Lovena Le, MD MBA The Palliative Medicine Team at [facility] Phone: [phone] Pager: [phone]

## 2013-05-11 DIAGNOSIS — K769 Liver disease, unspecified: Secondary | ICD-10-CM

## 2013-05-11 DIAGNOSIS — E43 Unspecified severe protein-calorie malnutrition: Secondary | ICD-10-CM

## 2013-05-11 DIAGNOSIS — N186 End stage renal disease: Secondary | ICD-10-CM

## 2013-05-11 DIAGNOSIS — Z515 Encounter for palliative care: Secondary | ICD-10-CM

## 2013-05-11 LAB — BASIC METABOLIC PANEL
BUN: 38 mg/dL — AB (ref 6–23)
CHLORIDE: 89 meq/L — AB (ref 96–112)
CO2: 26 mEq/L (ref 19–32)
Calcium: 8.1 mg/dL — ABNORMAL LOW (ref 8.4–10.5)
Creatinine, Ser: 2.99 mg/dL — ABNORMAL HIGH (ref 0.50–1.35)
GFR, EST AFRICAN AMERICAN: 24 mL/min — AB (ref >90)
GFR, EST NON AFRICAN AMERICAN: 21 mL/min — AB (ref >90)
GLUCOSE: 138 mg/dL — AB (ref 70–99)
Potassium: 4.3 mEq/L (ref 3.7–5.3)
Sodium: 135 mEq/L — ABNORMAL LOW (ref 137–147)

## 2013-05-11 LAB — CBC
HEMATOCRIT: 27.9 % — AB (ref 39.0–52.0)
Hemoglobin: 8.7 g/dL — ABNORMAL LOW (ref 13.0–17.0)
MCH: 25.2 pg — AB (ref 26.0–34.0)
MCHC: 31.2 g/dL (ref 30.0–36.0)
MCV: 80.9 fL (ref 78.0–100.0)
Platelets: 73 10*3/uL — ABNORMAL LOW (ref 150–400)
RBC: 3.45 MIL/uL — ABNORMAL LOW (ref 4.22–5.81)
RDW: 22.3 % — ABNORMAL HIGH (ref 11.5–15.5)
WBC: 9.3 10*3/uL (ref 4.0–10.5)

## 2013-05-11 LAB — AMMONIA: Ammonia: 30 umol/L (ref 11–60)

## 2013-05-11 LAB — GLUCOSE, CAPILLARY
GLUCOSE-CAPILLARY: 177 mg/dL — AB (ref 70–99)
GLUCOSE-CAPILLARY: 94 mg/dL (ref 70–99)
Glucose-Capillary: 129 mg/dL — ABNORMAL HIGH (ref 70–99)
Glucose-Capillary: 158 mg/dL — ABNORMAL HIGH (ref 70–99)

## 2013-05-11 MED ORDER — METOPROLOL TARTRATE 100 MG PO TABS
150.0000 mg | ORAL_TABLET | Freq: Two times a day (BID) | ORAL | Status: DC
Start: 2013-05-11 — End: 2013-05-15
  Administered 2013-05-11 – 2013-05-14 (×6): 150 mg via ORAL
  Filled 2013-05-11 (×10): qty 1

## 2013-05-11 NOTE — Progress Notes (Signed)
  Date: 05/11/2013  Patient name: Joshua [name]  Medical record number: [id number]  Date of birth: 07/05/1950   This patient has been seen and the plan of care was discussed with the house staff. Please see their note for complete details. I concur with their findings with the following additions/corrections: Joshua [name] mental status appears worse today. His abdominal ascites is increased. But, he is hemodynamically stable. He has multiple liver masses, a hx of prostate CA. Family had expressed no initial interest in further diagnostics, including paracentesis and liver biopsy. His wife was interested in palliative care. At this time, his wife has requested to discuss care with oncology. The etiology of the liver masses is likely metastatic, but the only way to know this is with biopsy, which she had initially declined. He is chronically ill and I don't think he would tolerate chemotherapy or surgical options at this time. Dr. Lucila Maine has discussed this with his wife and she would like to discuss further with oncology.  Dominic Pea, DO, [place] [facility] 05/11/2013, 4:39 PM

## 2013-05-11 NOTE — Progress Notes (Signed)
Subjective: Patient seen and examined at the bedside this morning. Wife is present. He is sleepy today, but arousable. He denies current chest pain. We told him that his platelets were low and we were stopping the Lovenox. He is agreeable to using the SCDs as long as they don't "push too hard." He says they were painful when he used them before. We explained that if this happens, the nurse can loosen them.  Palliative care meeting scheduled at 11 AM with Dr. Lovena Le.  Objective: Vital signs in last 24 hours: Filed Vitals:   05/10/13 2250 05/11/13 0422 05/11/13 0500 05/11/13 0734  BP: 100/60 111/60 108/69   Pulse:  118 119   Temp:   97.9 F (36.6 C)   TempSrc:   Oral   Resp:   18   Height:      Weight:   178 lb 5.6 oz (80.9 kg)   SpO2:   97% 96%   Weight change: 7.1 oz (0.2 kg)  Intake/Output Summary (Last 24 hours) at 05/11/13 0852 Last data filed at 05/11/13 0700  Gross per 24 hour  Intake    600 ml  Output    745 ml  Net   -145 ml    General: Resting in bed in NAD, occasional twitching of his bilateral shoulder/arm, cachectic Eyes: Jaundiced Cardiac: Tachycardia with regular rhythm, no rubs, murmur or gallops  Pulm: CTAB Abd: soft, significant abdominal distention with no tenderness to palpation, no guarding, BS present Ext: warm and well perfused, no lower extremity edema Neuro: alert and oriented X3, follows commands appropriately, moves all 4 extremities voluntairly  Lab Results: Basic Metabolic Panel:  Recent Labs Lab 05/10/13 0332 05/11/13 0240  NA 136* 135*  K 4.4 4.3  CL 91* 89*  CO2 25 26  GLUCOSE 139* 138*  BUN 39* 38*  CREATININE 3.03* 2.99*  CALCIUM 8.0* 8.1*   Liver Function Tests:  Recent Labs Lab 05/08/13 0344 05/09/13 0655  AST 54* 137*  ALT 18 38  ALKPHOS 450* 586*  BILITOT 0.5 0.7  PROT 6.4 7.6  ALBUMIN 1.7* 2.1*   CBC:  Recent Labs Lab 05/10/13 0332 05/11/13 0240  WBC 10.0 9.3  HGB 10.0* 8.7*  HCT 31.0* 27.9*  MCV 79.9  80.9  PLT 88* 73*   Cardiac Enzymes:  Recent Labs Lab 05/10/13 0332 05/10/13 1015 05/10/13 1600  TROPONINI <0.30 <0.30 <0.30   CBG:  Recent Labs Lab 05/09/13 1617 05/09/13 2124 05/10/13 1130 05/10/13 1621 05/10/13 2234 05/11/13 0645  GLUCAP 80 88 152* 115* 123* 129*   Thyroid Function Tests: No results found for this basename: TSH, T4TOTAL, FREET4, T3FREE, THYROIDAB,  in the last 168 hours Anemia Panel: No results found for this basename: VITAMINB12, FOLATE, FERRITIN, TIBC, IRON, RETICCTPCT,  in the last 168 hours Micro Results: No results found for this or any previous visit (from the past 240 hour(s)). Studies/Results: Ct Abdomen Pelvis Wo Contrast  05/09/2013   CLINICAL DATA:  Abdominal distention, prostate cancer, kidney transplant. Ascites.  EXAM: CT ABDOMEN AND PELVIS WITHOUT CONTRAST  TECHNIQUE: Multidetector CT imaging of the abdomen and pelvis was performed following the standard protocol without intravenous contrast.  COMPARISON:  02/28/2013  FINDINGS: Motion degraded images.  Cardiomegaly.  Moderate bilateral pleural effusions. Associated lower lobe atelectasis.  Unenhanced liver is poorly evaluated but notable for at least 3 dominant lesions in the right hepatic lobe, new, as follows:  --3.6 x 3.3 cm hypodense lesion in the posterior segment right hepatic lobe (series  2/ image 30)  --2.1 x 2.3 cm hypodense lesion in the posterior segment right hepatic lobe (series 2/ image 34)  --3.0 x 2.8 cm hypodense lesion in the inferior right hepatic lobe (series 2/image 38)  Additional ill-defined lesions are expected throughout the remaining liver. None of these lesions are visualized even in retrospect on prior CT. Primary differential considerations include metastases (from a nonvisualized primary) or possibly multifocal abscesses.  Unenhanced spleen is mildly heterogeneous with to possible hypodense posterior splenic lesions (series 2/ images 21 and 23), equivocal.  Pancreas and  adrenal glands are within normal limits.  Gallbladder is distended with a tiny layering gallstone (series 2/ image 35). No associated inflammatory changes by CT. No intrahepatic or extrahepatic ductal dilatation.  Bilateral negative renal atrophy with cortical calcifications. 1.5 cm lesion in the posterior right upper pole (series 2/ image 31) and 1.8 cm lesion in the posterior left lower pole (series 2/ image 40), which does not measure simple fluid density and cannot be characterized as simple on unenhanced CT. Right lower quadrant renal transplant without hydronephrosis.  No evidence of bowel obstruction. Apparent wall thickening along the ascending colon (coronal image 45), favored to reflect stool/debris given essentially normal appearance on prior CT. Normal appendix.  Atherosclerotic calcifications of the abdominal aorta and branch vessels.  Moderate to large volume abdominopelvic ascites, new from prior CT.  No suspicious abdominopelvic lymphadenopathy.  Brachytherapy seeds in the prostate.  Bladder is unremarkable.  Fluid within a small to moderate fat containing right mid abdominal ventral hernia (series 2/image 55).  Surgical clips in the bilateral inguinal regions.  Degenerative changes of the visualized thoracolumbar spine. Interval vertebral augmentation at L1 (sagittal image 57).  IMPRESSION: Multifocal hepatic lesions, measuring up to 3.6 cm, new from 02/28/2013. While poorly evaluated on unenhanced CT, this appearance is suspicious for metastases (from a nonvisualized primary) or possibly multifocal abscesses.  Moderate to large volume abdominopelvic ascites.  Apparent wall thickening along the ascending colon, although favored to reflect stool/debris given essentially normal appearance on prior CT.  Cardiomegaly with moderate bilateral pleural effusions.  Brachytherapy seeds in the prostate.  Additional stable ancillary findings as above.   Electronically Signed   By: Julian Hy M.D.   On:  05/09/2013 16:19   Medications: I have reviewed the patient's current medications. Scheduled Meds: . albuterol  2.5 mg Nebulization TID  . amiodarone  200 mg Oral BID  . antiseptic oral rinse  15 mL Mouth Rinse BID  . aspirin EC  81 mg Oral Daily  . atorvastatin  80 mg Oral q1800  . calcium acetate  2,001 mg Oral TID WC  . calcium carbonate  800 mg of elemental calcium Oral QHS  . collagenase   Topical Daily  . feeding supplement (GLUCERNA SHAKE)  237 mL Oral BID BM  . insulin aspart  0-9 Units Subcutaneous TID WC  . insulin glargine  10 Units Subcutaneous QHS  . isosorbide dinitrate  20 mg Oral BID  . levothyroxine  150 mcg Oral QAC breakfast  . metoprolol  150 mg Oral BID  . multivitamin with minerals  1 tablet Oral Daily  . mycophenolate  720 mg Oral BID  . pantoprazole  40 mg Oral Daily  . sirolimus  1 mg Oral q morning - 10a   Continuous Infusions: . sodium chloride Stopped (05/08/13 0831)   PRN Meds:.sodium chloride, sodium chloride, sodium chloride, sodium chloride, acetaminophen, calamine, diphenhydrAMINE, feeding supplement (NEPRO CARB STEADY), heparin, heparin, heparin, heparin, lidocaine (  PF), lidocaine (PF), lidocaine-prilocaine, lidocaine-prilocaine, oxyCODONE, pentafluoroprop-tetrafluoroeth, pentafluoroprop-tetrafluoroeth  Assessment/Plan: 63 year old male with a PMH of HTN, DM2, CAD (s/p MI and LAD stent), kidney transplantation, carotid artery stenosis (s/p CEA 08/2011), PVD (s/p fem-pop in 1980), dHF (EF 45-50% Jan 2015), hx of papillary thyroid cancer and prostate cancer who presents with complaint of edema x 3 days and dyspnea for several weeks.   #New liver masses on CT - CT 05/09/13 showed multifocal hepatic lesions, measuring up to 3.6 cm, new from 02/28/2013. While poorly evaluated on unenhanced CT, this appearance is suspicious for metastases. He has a history of papillary thyroid cancer and prostate cancer with known likely bone mets (see below). Patient and wife  are in agreement that they do not want to pursue a biopsy at this time and would like to talk about goals of care with a palliative specialist.  - Palliative care meeting scheduled at 11am today with Dr. Lovena Le - We also discussed therapeutic paracentesis given distention, but they are not interested at this time - Checking ammonia level, patient has some sequale of hepatic encephalopathy (somnolence, myoclonus)  #Tachyarrhythmias - HR 110s currently. 110s-130s yesterday. Troponins were trended yesterday and negative x3. Patient currently asymptomatic. EP has seen him already this admission regarding episodes of SVT and determined that at this time he is a poor candidate for ablation. - Continue telemetry - Increase metoprolol to 150mg  BID - Continue amiodarone 200mg  BID; plan decrease to 200mg  daily after 2 weeks of therapy (~05/13/13); low dose was started because there is potential for interaction with sirolimus - IV lopressor prn  - Will not adjust Synthroid yet as amio may make him hyper or more hypo.   #Volume overload - Was likely 2/2 worsening CHF in the setting of CKD about 13ys s/p renal transplant. CXR on presentation with small bilateral pleural effusions. Right heart cath on 02/20 revealed normal PCWP and normal CO. Dry weight appears to be 180 pounds. His O2 saturation is stable at 93-98% on 3L. He still has abdominal distention, likely from liver pathology given CT results, but his lower extremity edema has resolved. - Appreciate Nephrology's recommendations, MWF dialysis schedule - Continue Lopressor, isordil  - Daily weights > 178 - I&Os > -265L yesterday > -10.5L since admission - Plan was for d/c to [facility] but first we must clarify goals of cares today  #B/L pleural effusions - Antibiotics were started on 02/20 and his dyspnea has improved. Thoracentesis completed 02/22 and yielded 780cc of yellow fluid. Analysis consistent with transudate likely 2/2 to heart failure and  hypoalbuminemia. Pleural fluid culture negative. Cytology report came back today. It showed atypical cells. Staining pattern favors reactive mesothelial cells, but cannot exclude neoplasia. - Oxygen supplementation prn  - Incentive spirometer  - Albuterol nebs q6h   #HCAP, resolved - Fevers, leukocytosis and possible LLL infiltrate on admission CXR in an immunosuppressed patient. Antibiotic treatment completed. No recent fevers and no leukocytosis. Final blood cultures - no growth.  - Oxygen supplementation prn  - Albuterol nebs prn q6h   #CKD stage 3/4 - Pt ~13 years s/p renal transplant. Baseline Cr 1.9-2.5. Had been stable around 2.8 but began to increase last week and patient developed oliguria. Renal was re-consulted who decided to do a trial of HD x3 days, which he completed 3/2. He appears to be improving in terms of confusion and myoclonus, which were likely secondary to uremia. - Continue anti-rejection meds, will monitor for recovery of the transplanted kidney, but  likely this will not occur per renal; nephrology plans to taper these meds over the next 6-12 months to prevent graft-versus-host disease - Continue HD per renal, MWF  Creatinine, Ser  Date Value Ref Range Status  05/11/2013 2.99* 0.50 - 1.35 mg/dL Final  05/10/2013 3.03* 0.50 - 1.35 mg/dL Final  05/09/2013 2.53* 0.50 - 1.35 mg/dL Final  05/08/2013 2.79* 0.50 - 1.35 mg/dL Final  05/07/2013 3.16* 0.50 - 1.35 mg/dL Final    #Acute on chronic anemia -  He has anemia of chronic disease. He got RBCs during dialysis 3/2. Goal hemoglobin 10-11. Patient has several oozing superficial skin wounds and thrombocytopenia as below. - Continue to monitor  Hemoglobin  Date Value Ref Range Status  05/11/2013 8.7* 13.0 - 17.0 g/dL Final  05/10/2013 10.0* 13.0 - 17.0 g/dL Final  05/09/2013 10.5* 13.0 - 17.0 g/dL Final     DELTA CHECK NOTED  05/09/2013 13.9  13.0 - 17.0 g/dL Final     REPEATED TO VERIFY     DELTA CHECK NOTED     POST TRANSFUSION  SPECIMEN  05/08/2013 7.1* 13.0 - 17.0 g/dL Final    #Thrombocytopenia - Platelets 73 today. He is on Lovenox. Could also be 2/2 reduced liver synthetic function given CT findings. Sequela of superficial bleeding on exam. Trend below. - Discontinue Lovenox - Place and maintain SCDs - CBC in am  Platelets  Date Value Ref Range Status  05/11/2013 73* 150 - 400 K/uL Final     CONSISTENT WITH PREVIOUS RESULT  05/10/2013 88* 150 - 400 K/uL Final     CONSISTENT WITH PREVIOUS RESULT  05/09/2013 PLATELET CLUMPS NOTED ON SMEAR, UNABLE TO ESTIMATE  150 - 400 K/uL Final  05/09/2013 PLATELET CLUMPS NOTED ON SMEAR, COUNT APPEARS DECREASED  150 - 400 K/uL Final     LARGE PLATELETS PRESENT  05/08/2013 137* 150 - 400 K/uL Final    #Hypocalcemia - 6.3 on admission. Ionized Calcium low at 0.71-->0.79. Improving with supplementation. - Replacing Calcium with TUMS and PhosLo  Calcium  Date Value Ref Range Status  05/11/2013 8.1* 8.4 - 10.5 mg/dL Final  05/10/2013 8.0* 8.4 - 10.5 mg/dL Final  05/09/2013 8.7  8.4 - 10.5 mg/dL Final  05/08/2013 7.8* 8.4 - 10.5 mg/dL Final  05/07/2013 7.5* 8.4 - 10.5 mg/dL Final    #DM type 2 - No further episodes of hypoglycemia since decreasing Lantus. - Continue Lantus 10 units qHS  - SSI sensitive  - Checking CBGs AC and QHS  Glucose-Capillary  Date Value Ref Range Status  05/11/2013 129* 70 - 99 mg/dL Final  05/10/2013 123* 70 - 99 mg/dL Final  05/10/2013 115* 70 - 99 mg/dL Final  05/10/2013 152* 70 - 99 mg/dL Final  05/09/2013 88  70 - 99 mg/dL Final    #HTN - Stable. Currently normotensive. - Continued Lopressor, isordil - D/c hydralazine, lasix  #H/o prostate cancer with T10 lesion noted on prior MRI - T10 biopsy done inpatient on 02/18 - reveals necrosis and the appearance of "Ghost cells" that are likely malignant.  - Continue Oxycodone IR 10mg  q 4h PRN and Tylenol for back pain - Appreciate urology recommendations, started Firmagon injections 240 mg subcutaneous on  3/2  #Unstageable pressure ulcer, sacral - Patient aware of ulcer prior to admission. Wound care consulted and recommendations appreciated.  - Twice daily cleanse with normal saline and pat dry, debridement with Santyl, moist NS dressing (pack into wound), secure with dry 4x4 and tape  - Pressure redistribution  pad   #Severe malnutrition in the context of chronic illness - Continue Glucerna shakes, multivitamin.   #DVT ppx - SCDs and lovenox   Dispo: Disposition is deferred at this time, awaiting improvement of current medical problems.  Anticipated discharge in approximately  day(s).   The patient does have a current PCP (Sueanne Margarita, MD) and does not need an [facility] hospital follow-up appointment after discharge.  The patient does not have transportation limitations that hinder transportation to clinic appointments.  .Services Needed at time of discharge: Y = Yes, Blank = No PT:   OT:   RN:   Equipment:   Other:     LOS: 20 days   Lesly Dukes, MD 05/11/2013, 8:52 AM

## 2013-05-11 NOTE — Progress Notes (Addendum)
Palliative care meeting took place today. The nurse spent >2 hours with the patient and family establishing goals of care and discussing prognosis. Please see her detailed note. In brief, the patient himself expressed interest in more of a comfort care approach, wheras family members had many questions about the liver lesions, how long they had been there and what "stage" of cancer the may represent.   I spoke with the patient's wife this afternoon to touch base. Of note, there are many family members who love Mr. Scrogham and have been involved in his care, to the point where (earlier in his stay) the stress of so many visitors and opinions led to Mr. Drue Flirt requesting his chart be XXX'ed. I feel it is important that Mr. Oyervides himself and his wife/HCPOA (as representative of the family) be the primary individuals responsible for decision making.  I told Mrs. Rigel again that the lesions in her husband's liver most likely represent metastatic cancer given his history, however we cannot know for sure without getting a biopsy and testing the tissue. I explained that if we did this, the issue would be that I do not think it would change our management. He is very sick with multiple failing organs (cardiac, ESRD) as well as severe malnutrition. Because of this, he would not tolerate well the strain of surgery or chemotherapy.   I did offer to the wife the option of calling a cancer doctor, mostly to speak to the family and address their many questions. She expressed to me that this would be nice. I told her that it would be difficult to have a cancer doctor come by this late in the day, but we are happy to arrange for a consultant tomorrow who can at least call the family and answer their questions.  I spoke with Tiffany at [phone], who has left a message for the oncologist on call tomorrow (I believe it's Dr. Benay Spice) to touch base with the family.  Otherwise, the plan is to see how he tolerates  dialysis tomorrow and make a decision from there re: residential hospice vs. LTAC with continued dialysis.  Lesly Dukes, MD  [email or web address] Pager # [phone] Office # [phone]

## 2013-05-11 NOTE — Consult Note (Signed)
Patient [id number] Rabideau      DOB: 02-Oct-1950      IRS:[id number]     Consult Note from the Palliative Medicine Team at [facility] Requested by: Dr. Lucila Maine     PCP: Sueanne Margarita, MD Reason for Consultation: [place] and options.    Phone Number:[phone]  Assessment of patients Current state: Mr. Armijo 62 yo admitted with chest pain found to be related to intermittent tachycardia and episodes of SVT. EP has said that he is a poor candidate for ablation. Since admission he was found to have new hepatic lesions that is suspicious for metastatic disease. He has a complex PMH of prostate cancer (brachytherapy seeds seen on CT), bone scan 12/26 showed no bony metastasis but MRI led to T10 biopsy 2/18 showing necrosis and "ghost cells" suspicious for malignancy. He also history of right renal transplant and is now receiving hemodialysis for which his BP did not tolerate his last treatment. Mr. Russett also has severe malnutrition (Albumin 2.1).   I met today with Mr. Soeder, his wife Jeanne Ivan, and daughter Varney Biles. They have many questions about his liver lesions that I explained we cannot answer for sure without further testing and biopsy. I did acknowledge our concern over his scans for an advanced malignancy. The family is extremely tearful and struggling with this information and the decisions they are faced with. I spoke with Mr. Grabe alone and he acknowledged to me that "I know I don't have much time left and I have already spoken with the Reita Cliche." He tells me that he is at peace and accepting of when it is his time to pass. He is concerned about his family and says after telling me that he knows his time is limited that he knows his family has to see the signs for themselves. I relayed this dialogue to his family. Varney Biles tells me that she has noticed his conversations changing recently to possibly him trying to prepare her (for his death). Mr. Champney tells me that he would not  want CPR or intubation so I made him DNR and informed his family who agree to conform to whatever he wishes. However, his family continues to be distraught with this information.   I spoke with his family again this afternoon who would at least like to know options and more information about these lesions and cancer progression so oncology consult was requested. I do not believe that Mr. Bottger (getting to lethargic from pain medication to question him any further) will want treatment if it is offered (he knows his body will not tolerate) but his family has many questions and they need to know for sure if they even have options and would like to know as much as they can about what they may be dealing with (as far as cancer progression - past and future). I will continue to follow and support his family. They are unable to handle any further decisions/discussion emotionally at this time. I will continue to follow and discuss with them tomorrow. We agree to wait and see how he tolerates his dialysis treatment tomorrow - if this will change his plan and goals.    Goals of Care: 1.  Code Status: DNR   2. Scope of Treatment: Continue medical treatment including dialysis. He does not want to pursue any further testing at this time because he does not want to pursue aggressive treatment options (if any offered) for liver masses (assumed cancer).    4. Disposition:  To be determined on further goals and outcomes. LTAC vs hospice.    3. Symptom Management:   1. Pain: Oxycodone prn.  2. Fever: Acetaminophen prn.  3. Weakness: Continue medical management. PT eval.   4. Psychosocial: Emotional support provided to patient and family. Family needs much support during this time.  5. Spiritual: Supported by [employer].    Brief HPI: 63 yo male with multiple medical problems including new liver lesions and spinal "ghost cells" suspicious for advanced malignancy. He is also failure to thrive with  severe malnutrition and currently on dialysis for renal failure.   ROS: + pain (generalized), denies nausea, denies shortness of breath, denies constipation    PMH:  Past Medical History  Diagnosis Date  . Carotid artery occlusion   . ASCVD (arteriosclerotic cardiovascular disease)   . CHF (congestive heart failure)   . Anginal pain     last chest pain was a couple of months ago-just lasted less than a minute  . Stroke     "light stroke" about time of MI  . Renal failure     s/p renal transplant  . Aortic stenosis     moderate AS by 06/2011 echo (Dr. Fransico Him)  . Coronary artery disease 03/2009    2 vessel s/p BMS to LAD in setting of NSTEMI, RCA chronically occluded  . PVD (peripheral vascular disease)     s/p fempop 1980's  . Hypothyroidism     s/p papillary thyroid CA excision  . Hypertension   . Hyperlipidemia   . Diastolic dysfunction   . Type II diabetes mellitus   . Prostate cancer     "radiation tx ~ 4-5 yr ago; cleared then came back a couple months ago" (03/20/2013)  . Plavix resistance     a. P2y12 365 in 03/2013.  . End stage renal disease- s/p transplant 04/03/2013    Patient states he started dialysis sometime around 1981-1982.  He then received a kidney transplant in [facility] at [facility].  The transplant kidney is still functioning, he sees Dr Florene Glen at [facility] about every 6 months as of 2015.       PSH: Past Surgical History  Procedure Laterality Date  . Nephrectomy transplanted organ Bilateral 2001  . Pr vein bypass graft,aorto-fem-pop  1980    fem pop   . Endarterectomy  08/25/2011    Procedure: ENDARTERECTOMY CAROTID;  Surgeon: Angelia Mould, MD;  Location: [facility];  Service: Vascular;  Laterality: Right;  . Thyroidectomy    . Coronary angioplasty with stent placement  03/2009    "1"  . Colonoscopy N/A 03/31/2013    Procedure: COLONOSCOPY;  Surgeon: Lafayette Dragon, MD;  Location: [facility] ENDOSCOPY;  Service: Endoscopy;  Laterality: N/A;  .  Esophagogastroduodenoscopy N/A 03/31/2013    Procedure: ESOPHAGOGASTRODUODENOSCOPY (EGD);  Surgeon: Lafayette Dragon, MD;  Location: [facility] ENDOSCOPY;  Service: Endoscopy;  Laterality: N/A;   I have reviewed the Accident and SH and  If appropriate update it with new information. Allergies  Allergen Reactions  . Fentanyl     "Almost died"   . Propofol     "Almost died"    Scheduled Meds: . albuterol  2.5 mg Nebulization TID  . amiodarone  200 mg Oral BID  . antiseptic oral rinse  15 mL Mouth Rinse BID  . aspirin EC  81 mg Oral Daily  . atorvastatin  80 mg Oral q1800  . calcium acetate  2,001 mg Oral TID WC  . calcium carbonate  800 mg of elemental calcium Oral QHS  . collagenase   Topical Daily  . feeding supplement (GLUCERNA SHAKE)  237 mL Oral BID BM  . insulin aspart  0-9 Units Subcutaneous TID WC  . insulin glargine  10 Units Subcutaneous QHS  . isosorbide dinitrate  20 mg Oral BID  . levothyroxine  150 mcg Oral QAC breakfast  . metoprolol  150 mg Oral BID  . multivitamin with minerals  1 tablet Oral Daily  . mycophenolate  720 mg Oral BID  . pantoprazole  40 mg Oral Daily  . sirolimus  1 mg Oral q morning - 10a   Continuous Infusions: . sodium chloride Stopped (05/08/13 0831)   PRN Meds:.sodium chloride, sodium chloride, sodium chloride, sodium chloride, acetaminophen, calamine, diphenhydrAMINE, feeding supplement (NEPRO CARB STEADY), heparin, heparin, heparin, heparin, lidocaine (PF), lidocaine (PF), lidocaine-prilocaine, lidocaine-prilocaine, oxyCODONE, pentafluoroprop-tetrafluoroeth, pentafluoroprop-tetrafluoroeth    BP 124/67  Pulse 119  Temp(Src) 97.9 F (36.6 C) (Oral)  Resp 20  Ht $R'5\' 11"'We$  (1.803 m)  Wt 80.9 kg (178 lb 5.6 oz)  BMI 24.89 kg/m2  SpO2 98%   PPS: 30%   Intake/Output Summary (Last 24 hours) at 05/11/13 1334 Last data filed at 05/11/13 0700  Gross per 24 hour  Intake    480 ml  Output    150 ml  Net    330 ml   LBM: 05/11/13                          Physical Exam:  General: NAD, resting, cachectic HEENT: + Icterus, + temporal muscle wasting Chest: Clear throughout, diminished bases, no labored breathing CVS: Tachycardia, regular, S1 S2 Abdomen: Soft, NT, severly distended, +BS Ext: MAE, no edema, warm to touch, generalized weakness Neuro: Alert & oriented x 3, follows commands  Labs: CBC    Component Value Date/Time   WBC 9.3 05/11/2013 0240   RBC 3.45* 05/11/2013 0240   RBC 3.03* 05/01/2013 1245   HGB 8.7* 05/11/2013 0240   HCT 27.9* 05/11/2013 0240   PLT 73* 05/11/2013 0240   MCV 80.9 05/11/2013 0240   MCH 25.2* 05/11/2013 0240   MCHC 31.2 05/11/2013 0240   RDW 22.3* 05/11/2013 0240   LYMPHSABS 1.1 05/03/2013 0315   MONOABS 0.9 05/03/2013 0315   EOSABS 0.1 05/03/2013 0315   BASOSABS 0.0 05/03/2013 0315    BMET    Component Value Date/Time   NA 135* 05/11/2013 0240   K 4.3 05/11/2013 0240   CL 89* 05/11/2013 0240   CO2 26 05/11/2013 0240   GLUCOSE 138* 05/11/2013 0240   BUN 38* 05/11/2013 0240   CREATININE 2.99* 05/11/2013 0240   CALCIUM 8.1* 05/11/2013 0240   GFRNONAA 21* 05/11/2013 0240   GFRAA 24* 05/11/2013 0240    CMP     Component Value Date/Time   NA 135* 05/11/2013 0240   K 4.3 05/11/2013 0240   CL 89* 05/11/2013 0240   CO2 26 05/11/2013 0240   GLUCOSE 138* 05/11/2013 0240   BUN 38* 05/11/2013 0240   CREATININE 2.99* 05/11/2013 0240   CALCIUM 8.1* 05/11/2013 0240   PROT 7.6 05/09/2013 0655   ALBUMIN 2.1* 05/09/2013 0655   AST 137* 05/09/2013 0655   ALT 38 05/09/2013 0655   ALKPHOS 586* 05/09/2013 0655   BILITOT 0.7 05/09/2013 0655   GFRNONAA 21* 05/11/2013 0240   GFRAA 24* 05/11/2013 0240    Time In Time Out Total Time Spent with Patient Total Overall Time  1100 1300 19min 155min    Greater than 50%  of this time was spent counseling and coordinating care related to the above assessment and plan.  Vinie Sill, NP Palliative Medicine Team Pager # [phone] (M-F 8a-5p) Team Phone # [phone] (Nights/Weekends)

## 2013-05-12 ENCOUNTER — Other Ambulatory Visit: Payer: Self-pay

## 2013-05-12 DIAGNOSIS — K5900 Constipation, unspecified: Secondary | ICD-10-CM

## 2013-05-12 DIAGNOSIS — K769 Liver disease, unspecified: Secondary | ICD-10-CM

## 2013-05-12 DIAGNOSIS — I120 Hypertensive chronic kidney disease with stage 5 chronic kidney disease or end stage renal disease: Secondary | ICD-10-CM

## 2013-05-12 LAB — BASIC METABOLIC PANEL
BUN: 50 mg/dL — ABNORMAL HIGH (ref 6–23)
BUN: 52 mg/dL — ABNORMAL HIGH (ref 6–23)
CHLORIDE: 86 meq/L — AB (ref 96–112)
CO2: 20 meq/L (ref 19–32)
CO2: 26 mEq/L (ref 19–32)
CREATININE: 3.58 mg/dL — AB (ref 0.50–1.35)
CREATININE: 3.61 mg/dL — AB (ref 0.50–1.35)
Calcium: 8.2 mg/dL — ABNORMAL LOW (ref 8.4–10.5)
Calcium: 8.7 mg/dL (ref 8.4–10.5)
Chloride: 87 mEq/L — ABNORMAL LOW (ref 96–112)
GFR calc Af Amer: 19 mL/min — ABNORMAL LOW (ref >90)
GFR calc non Af Amer: 17 mL/min — ABNORMAL LOW (ref >90)
GFR, EST AFRICAN AMERICAN: 20 mL/min — AB (ref >90)
GFR, EST NON AFRICAN AMERICAN: 17 mL/min — AB (ref >90)
GLUCOSE: 164 mg/dL — AB (ref 70–99)
Glucose, Bld: 159 mg/dL — ABNORMAL HIGH (ref 70–99)
POTASSIUM: 6.3 meq/L — AB (ref 3.7–5.3)
POTASSIUM: 5.4 meq/L — AB (ref 3.7–5.3)
Sodium: 132 mEq/L — ABNORMAL LOW (ref 137–147)
Sodium: 135 mEq/L — ABNORMAL LOW (ref 137–147)

## 2013-05-12 LAB — CBC
HEMATOCRIT: 33.5 % — AB (ref 39.0–52.0)
HEMOGLOBIN: 10.8 g/dL — AB (ref 13.0–17.0)
MCH: 25.8 pg — ABNORMAL LOW (ref 26.0–34.0)
MCHC: 32.2 g/dL (ref 30.0–36.0)
MCV: 80 fL (ref 78.0–100.0)
RBC: 4.19 MIL/uL — AB (ref 4.22–5.81)
RDW: 22.9 % — AB (ref 11.5–15.5)
WBC: 8.9 10*3/uL (ref 4.0–10.5)

## 2013-05-12 LAB — GLUCOSE, CAPILLARY
GLUCOSE-CAPILLARY: 159 mg/dL — AB (ref 70–99)
Glucose-Capillary: 167 mg/dL — ABNORMAL HIGH (ref 70–99)
Glucose-Capillary: 157 mg/dL — ABNORMAL HIGH (ref 70–99)
Glucose-Capillary: 172 mg/dL — ABNORMAL HIGH (ref 70–99)

## 2013-05-12 LAB — AMMONIA: AMMONIA: 64 umol/L — AB (ref 11–60)

## 2013-05-12 LAB — PLATELET COUNT: Platelets: 65 10*3/uL — ABNORMAL LOW (ref 150–400)

## 2013-05-12 MED ORDER — SENNOSIDES-DOCUSATE SODIUM 8.6-50 MG PO TABS
2.0000 | ORAL_TABLET | Freq: Every evening | ORAL | Status: DC | PRN
  Filled 2013-05-12: qty 2

## 2013-05-12 MED ORDER — SODIUM CHLORIDE 0.9 % IV SOLN: 100.0000 mL | INTRAVENOUS | Status: DC | PRN

## 2013-05-12 MED ORDER — ALPRAZOLAM 0.25 MG PO TABS
0.2500 mg | ORAL_TABLET | Freq: Every evening | ORAL | Status: DC | PRN
  Administered 2013-05-13: 0.25 mg via ORAL
  Filled 2013-05-12: qty 1

## 2013-05-12 MED ORDER — HEPARIN SODIUM (PORCINE) 1000 UNIT/ML DIALYSIS: 20.0000 [IU]/kg | INTRAMUSCULAR | Status: DC | PRN

## 2013-05-12 MED ORDER — OXYCODONE HCL 5 MG PO TABS
10.0000 mg | ORAL_TABLET | ORAL | Status: AC
  Administered 2013-05-12: 10 mg via ORAL
  Filled 2013-05-12: qty 2

## 2013-05-12 MED ORDER — ALBUTEROL SULFATE (2.5 MG/3ML) 0.083% IN NEBU
2.5000 mg | INHALATION_SOLUTION | Freq: Three times a day (TID) | RESPIRATORY_TRACT | Status: DC
  Administered 2013-05-12: 2.5 mg via RESPIRATORY_TRACT
  Filled 2013-05-12: qty 3

## 2013-05-12 MED ORDER — POLYETHYLENE GLYCOL 3350 17 G PO PACK
17.0000 g | PACK | Freq: Once | ORAL | Status: AC
  Administered 2013-05-12: 17 g via ORAL
  Filled 2013-05-12: qty 1

## 2013-05-12 MED ORDER — LIDOCAINE 5 % EX PTCH
1.0000 | MEDICATED_PATCH | Freq: Every day | CUTANEOUS | Status: DC
  Administered 2013-05-12 – 2013-05-15 (×4): 1 via TRANSDERMAL
  Filled 2013-05-12 (×7): qty 1

## 2013-05-12 MED ORDER — ALBUTEROL SULFATE (2.5 MG/3ML) 0.083% IN NEBU: 2.5000 mg | INHALATION_SOLUTION | Freq: Four times a day (QID) | RESPIRATORY_TRACT | Status: DC | PRN

## 2013-05-12 MED ORDER — LIDOCAINE HCL (PF) 1 % IJ SOLN: 5.0000 mL | INTRAMUSCULAR | Status: DC | PRN

## 2013-05-12 MED ORDER — BLISTEX MEDICATED EX OINT
TOPICAL_OINTMENT | CUTANEOUS | Status: DC | PRN
  Administered 2013-05-13: 01:00:00 via TOPICAL
  Filled 2013-05-12: qty 10

## 2013-05-12 MED ORDER — HEPARIN SODIUM (PORCINE) 1000 UNIT/ML DIALYSIS: 1000.0000 [IU] | INTRAMUSCULAR | Status: DC | PRN

## 2013-05-12 MED ORDER — PENTAFLUOROPROP-TETRAFLUOROETH EX AERO: 1.0000 "application " | INHALATION_SPRAY | CUTANEOUS | Status: DC | PRN

## 2013-05-12 MED ORDER — ALTEPLASE 2 MG IJ SOLR
2.0000 mg | Freq: Once | INTRAMUSCULAR | Status: DC | PRN
  Filled 2013-05-12: qty 2

## 2013-05-12 MED ORDER — SODIUM POLYSTYRENE SULFONATE 15 GM/60ML PO SUSP
30.0000 g | Freq: Once | ORAL | Status: DC
  Filled 2013-05-12: qty 120

## 2013-05-12 MED ORDER — LIDOCAINE-PRILOCAINE 2.5-2.5 % EX CREA
1.0000 "application " | TOPICAL_CREAM | CUTANEOUS | Status: DC | PRN
  Filled 2013-05-12: qty 5

## 2013-05-12 MED ORDER — NEPRO/CARBSTEADY PO LIQD
237.0000 mL | ORAL | Status: DC | PRN
  Filled 2013-05-12: qty 237

## 2013-05-12 NOTE — Consult Note (Signed)
I have reviewed this case with our NP and agree with the Assessment and Plan as stated.  Jalon Squier L. Bentlee Drier, MD MBA The Palliative Medicine Team at [facility] Team Phone: [phone] Pager: [phone]   

## 2013-05-12 NOTE — Progress Notes (Signed)
Physical Therapy Treatment Patient Details Name: Joshua [name] MRN: [id number] DOB: November 26, 1950 Today's Date: 05/12/2013 Time: 1135-1200 PT Time Calculation (min): 25 min  PT Assessment / Plan / Recommendation  History of Present Illness Pt admitted with CHF, ESRD, underwent T 10 kyphoplasty, has sacral wound  PMH of HTN, DM2, CAD with stents, CAD, PVD s/p fem pop, prostate cancer treated with radiation, hx of thyroid cancer, h/o ESRD and kidney transplantation    PT Comments   Patient able to participate in sitting edge of bed activity for about 15 minutes.  Attempted twice to stand, but was unable to maintain standing even with maximal assist of 1.  Will follow acutely and treat as tolerated/willing.  Goals downgraded this date.  Follow Up Recommendations  LTACH;Other (comment) (versus Hospice)           Equipment Recommendations  None recommended by PT       Frequency Min 2X/week   Progress towards PT Goals Progress towards PT goals: Goals downgraded-see care plan  Plan Frequency needs to be updated;Discharge plan needs to be updated    Precautions / Restrictions Precautions Precautions: Fall   Pertinent Vitals/Pain Min c/o abdominal pain    Mobility  Bed Mobility Overal bed mobility: Needs Assistance Rolling: Min assist Sidelying to sit: Max assist Supine to sit: Max assist General bed mobility comments: assist with both trunk and LE's for out and in bed Transfers Overall transfer level: Needs assistance Transfers: Sit to/from Stand Sit to Stand: Max assist;From elevated surface General transfer comment: able to attempt standing x 2 trials with lifting from in front of patient to block knees (unable to stand with walker in front,) unable to get both hips and knees extended and sat back on bed both trials    Exercises General Exercises - Upper Extremity Elbow Flexion: AROM;Both;Seated;Other reps (comment) (8) General Exercises - Lower Extremity Long Arc Quad: Other  reps (comment);AROM;Both;Seated (8) Hip Flexion/Marching: AAROM;Both;5 reps;Seated     PT Goals (current goals can now be found in the care plan section)    Visit Information  Last PT Received On: 05/12/13 Assistance Needed: +2 History of Present Illness: Pt admitted with CHF, ESRD, underwent T 10 kyphoplasty, has sacral wound  PMH of HTN, DM2, CAD with stents, CAD, PVD s/p fem pop, prostate cancer treated with radiation, hx of thyroid cancer, h/o ESRD and kidney transplantation     Subjective Data      Cognition  Cognition Arousal/Alertness: Awake/alert Behavior During Therapy: WFL for tasks assessed/performed Overall Cognitive Status: Impaired/Different from baseline Area of Impairment: Problem solving;Following commands Memory: Decreased short-term memory Following Commands: Follows one step commands with increased time Problem Solving: Slow processing General Comments: hallucinating about baby in the room, but easily redirected    Balance  Balance Overall balance assessment: Needs assistance Standing balance-Leahy Scale: Poor  End of Session PT - End of Session Equipment Utilized During Treatment: Gait belt;Oxygen Activity Tolerance: Patient limited by fatigue Patient left: in bed;with call bell/phone within reach;with family/visitor present   GP     [facility] 05/12/2013, 1:49 PM Joshua [name], [place] 05/12/2013

## 2013-05-12 NOTE — Progress Notes (Signed)
Progress Note from the Palliative Medicine Team at [facility]: [name] is awake and alert today for me but slow to respond. His wife and daughters are at his bedside. They are waiting for the oncologist to give their opinion about his hepatic lesions and if he would even be a candidate for any treatment options. I reinforced that [name] has the last say in what he does or doesn't want done to him and his family reassure him that they will support whatever he decides. He is also scheduled for dialysis today and we will see if he can continue to tolerate dialysis. He did not tolerate dialysis on Wednesday and I have explained to him and his family our medical limitations if he cannot tolerate dialysis. We did discuss hospice as an option if we are going to focus on his comfort. The family is anxious to speak with oncology and to see how he does in dialysis. I gave them Hard Choices book and I plan to help him complete MOST form when they have received all the information and answers they need to make choices for him.   Objective: Allergies  Allergen Reactions  . Fentanyl     "Almost died"   . Propofol     "Almost died"    Scheduled Meds: . albuterol  2.5 mg Nebulization TID  . amiodarone  200 mg Oral BID  . antiseptic oral rinse  15 mL Mouth Rinse BID  . aspirin EC  81 mg Oral Daily  . atorvastatin  80 mg Oral q1800  . calcium acetate  2,001 mg Oral TID WC  . calcium carbonate  800 mg of elemental calcium Oral QHS  . collagenase   Topical Daily  . feeding supplement (GLUCERNA SHAKE)  237 mL Oral BID BM  . insulin aspart  0-9 Units Subcutaneous TID WC  . insulin glargine  10 Units Subcutaneous QHS  . isosorbide dinitrate  20 mg Oral BID  . levothyroxine  150 mcg Oral QAC breakfast  . metoprolol  150 mg Oral BID  . multivitamin with minerals  1 tablet Oral Daily  . mycophenolate  720 mg Oral BID  . pantoprazole  40 mg Oral Daily  . polyethylene glycol  17 g Oral  Once  . sirolimus  1 mg Oral q morning - 10a   Continuous Infusions: . sodium chloride Stopped (05/08/13 0831)   PRN Meds:.sodium chloride, sodium chloride, sodium chloride, sodium chloride, calamine, diphenhydrAMINE, feeding supplement (NEPRO CARB STEADY), heparin, heparin, heparin, heparin, lidocaine (PF), lidocaine (PF), lidocaine-prilocaine, lidocaine-prilocaine, oxyCODONE, pentafluoroprop-tetrafluoroeth, pentafluoroprop-tetrafluoroeth, senna-docusate  BP 109/61  Pulse 104  Temp(Src) 98.5 F (36.9 C) (Oral)  Resp 16  Ht 5\' 11"  (1.803 m)  Wt 81.4 kg (179 lb 7.3 oz)  BMI 25.04 kg/m2  SpO2 94%   PPS: 40%   Pain Score: back/legs    Intake/Output Summary (Last 24 hours) at 05/12/13 1146 Last data filed at 05/12/13 0600  Gross per 24 hour  Intake    240 ml  Output    200 ml  Net     40 ml      LBM: 05/12/13      Physical Exam:  General: NAD, resting, cachectic  HEENT: + Icterus, + temporal muscle wasting  Chest: Clear throughout, diminished bases, no labored breathing  CVS: Tachycardia, regular, S1 S2  Abdomen: Soft, NT, severly distended, +BS  Ext: MAE, no edema, warm to touch, generalized weakness  Neuro: Alert & oriented x 3,  follows commands  Labs: CBC    Component Value Date/Time   WBC 8.9 05/12/2013 0355   RBC 4.19* 05/12/2013 0355   RBC 3.03* 05/01/2013 1245   HGB 10.8* 05/12/2013 0355   HCT 33.5* 05/12/2013 0355   PLT 65* 05/12/2013 0758   MCV 80.0 05/12/2013 0355   MCH 25.8* 05/12/2013 0355   MCHC 32.2 05/12/2013 0355   RDW 22.9* 05/12/2013 0355   LYMPHSABS 1.1 05/03/2013 0315   MONOABS 0.9 05/03/2013 0315   EOSABS 0.1 05/03/2013 0315   BASOSABS 0.0 05/03/2013 0315    BMET    Component Value Date/Time   NA 135* 05/12/2013 0637   K 5.4* 05/12/2013 0637   CL 87* 05/12/2013 0637   CO2 26 05/12/2013 0637   GLUCOSE 164* 05/12/2013 0637   BUN 52* 05/12/2013 0637   CREATININE 3.61* 05/12/2013 0637   CALCIUM 8.7 05/12/2013 0637   GFRNONAA 17* 05/12/2013 0637   GFRAA 19* 05/12/2013 0637     CMP     Component Value Date/Time   NA 135* 05/12/2013 0637   K 5.4* 05/12/2013 0637   CL 87* 05/12/2013 0637   CO2 26 05/12/2013 0637   GLUCOSE 164* 05/12/2013 0637   BUN 52* 05/12/2013 0637   CREATININE 3.61* 05/12/2013 0637   CALCIUM 8.7 05/12/2013 0637   PROT 7.6 05/09/2013 0655   ALBUMIN 2.1* 05/09/2013 0655   AST 137* 05/09/2013 0655   ALT 38 05/09/2013 0655   ALKPHOS 586* 05/09/2013 0655   BILITOT 0.7 05/09/2013 0655   GFRNONAA 17* 05/12/2013 0637   GFRAA 19* 05/12/2013 0637    Assessment and Plan: 1. Code Status: DNR 2. Symptom Control:  1. Pain: Oxycodone prn. Lidoderm patch to lower back (complaining of pain that seems to be radiating from back into right leg). 2. Sleep: Low dose Xanax trial for sleep tonight prn.  3. Fever: Acetaminophen prn.  4. Weakness: Continue medical management. PT eval.  3. Psycho/Social: Emotional support provided to patient and family.  4. Spiritual: Personal church members supporting them spiritually.  5. Disposition: To be determined on outcomes.   Patient Documents Completed or Given: Document Given Completed  Advanced Directives Pkt    MOST    DNR    Gone from My Sight    Hard Choices yes     Time In Time Out Total Time Spent with Patient Total Overall Time  1415 1500 57min 45    Greater than 50%  of this time was spent counseling and coordinating care related to the above assessment and plan.  Vinie Sill, NP Palliative Medicine Team Pager # [phone] (M-F 8a-5p) Team Phone # [phone] (Nights/Weekends)    1

## 2013-05-12 NOTE — Progress Notes (Signed)
Subjective: Patient seen and examined at the bedside this morning. Wife and 3 daughters are present. Patient is somnolent, but arousable and able to participate in history taking. Patient denies chest pain. He says he had some crampy abdominal pain this morning, but none presently. He hasn't had a bowel movement in several days. Family is looking forward to talking to Dr. Benay Spice today. We talked a little bit about hospice. Patient expressed interest in going home if possible.  Objective: Vital signs in last 24 hours: Filed Vitals:   05/11/13 2103 05/11/13 2349 05/12/13 0611 05/12/13 0743  BP: 100/56 110/65 109/61   Pulse: 107  104   Temp: 97.7 F (36.5 C)  98.5 F (36.9 C)   TempSrc: Oral  Oral   Resp: 17  16   Height:      Weight:   179 lb 7.3 oz (81.4 kg)   SpO2: 92%  94% 94%   Weight change: -3 lb 15.5 oz (-1.8 kg)  Intake/Output Summary (Last 24 hours) at 05/12/13 1122 Last data filed at 05/12/13 0600  Gross per 24 hour  Intake    240 ml  Output    200 ml  Net     40 ml    General: Resting in bed in NAD, occasional twitching of his bilateral shoulder/arm, cachectic Eyes: Jaundiced Cardiac: Tachycardia with regular rhythm, no rubs, murmur or gallops  Pulm: CTAB Abd: continued significant abdominal distention, but soft with no tenderness to palpation, no guarding, BS present Ext: warm and well perfused, no lower extremity edema Neuro: alert and oriented X3, follows commands appropriately, moves all 4 extremities voluntairly  Lab Results: Basic Metabolic Panel:  Recent Labs Lab 05/12/13 0355 05/12/13 0637  NA 132* 135*  K 6.3* 5.4*  CL 86* 87*  CO2 20 26  GLUCOSE 159* 164*  BUN 50* 52*  CREATININE 3.58* 3.61*  CALCIUM 8.2* 8.7   Liver Function Tests:  Recent Labs Lab 05/08/13 0344 05/09/13 0655  AST 54* 137*  ALT 18 38  ALKPHOS 450* 586*  BILITOT 0.5 0.7  PROT 6.4 7.6  ALBUMIN 1.7* 2.1*   CBC:  Recent Labs Lab 05/11/13 0240 05/12/13 0355  05/12/13 0758  WBC 9.3 8.9  --   HGB 8.7* 10.8*  --   HCT 27.9* 33.5*  --   MCV 80.9 80.0  --   PLT 73* PLATELET CLUMPS NOTED ON SMEAR 65*   Cardiac Enzymes:  Recent Labs Lab 05/10/13 0332 05/10/13 1015 05/10/13 1600  TROPONINI <0.30 <0.30 <0.30   CBG:  Recent Labs Lab 05/10/13 2234 05/11/13 0645 05/11/13 1115 05/11/13 1603 05/11/13 2108 05/12/13 1056  GLUCAP 123* 129* 177* 94 158* 167*   Thyroid Function Tests: No results found for this basename: TSH, T4TOTAL, FREET4, T3FREE, THYROIDAB,  in the last 168 hours Anemia Panel: No results found for this basename: VITAMINB12, FOLATE, FERRITIN, TIBC, IRON, RETICCTPCT,  in the last 168 hours Micro Results: No results found for this or any previous visit (from the past 240 hour(s)). Studies/Results: No results found. Medications: I have reviewed the patient's current medications. Scheduled Meds: . albuterol  2.5 mg Nebulization TID  . amiodarone  200 mg Oral BID  . antiseptic oral rinse  15 mL Mouth Rinse BID  . aspirin EC  81 mg Oral Daily  . atorvastatin  80 mg Oral q1800  . calcium acetate  2,001 mg Oral TID WC  . calcium carbonate  800 mg of elemental calcium Oral QHS  . collagenase  Topical Daily  . feeding supplement (GLUCERNA SHAKE)  237 mL Oral BID BM  . insulin aspart  0-9 Units Subcutaneous TID WC  . insulin glargine  10 Units Subcutaneous QHS  . isosorbide dinitrate  20 mg Oral BID  . levothyroxine  150 mcg Oral QAC breakfast  . metoprolol  150 mg Oral BID  . multivitamin with minerals  1 tablet Oral Daily  . mycophenolate  720 mg Oral BID  . pantoprazole  40 mg Oral Daily  . sirolimus  1 mg Oral q morning - 10a   Continuous Infusions: . sodium chloride Stopped (05/08/13 0831)   PRN Meds:.sodium chloride, sodium chloride, sodium chloride, sodium chloride, acetaminophen, calamine, diphenhydrAMINE, feeding supplement (NEPRO CARB STEADY), heparin, heparin, heparin, heparin, lidocaine (PF), lidocaine (PF),  lidocaine-prilocaine, lidocaine-prilocaine, oxyCODONE, pentafluoroprop-tetrafluoroeth, pentafluoroprop-tetrafluoroeth, senna-docusate  Assessment/Plan: 63 year old male with a PMH of HTN, DM2, CAD (s/p MI and LAD stent), kidney transplantation, carotid artery stenosis (s/p CEA 08/2011), PVD (s/p fem-pop in 1980), dHF (EF 45-50% Jan 2015), hx of papillary thyroid cancer and prostate cancer who presents with complaint of edema x 3 days and dyspnea for several weeks.   #New liver masses on CT - Palliative care family meeting yesterday. The patient himself expressed interest in more of a comfort care approach, wheras family members had many questions about the liver lesions, how long they had been there and what "stage" of cancer the may represent. At this point the patient does NOT want to pursue further testing (i.e. biopsy) nor aggressive treatment options. However, family would like to at least talk to a cancer specialist, which I am happy to arrange. Overall, the plan is to talk to the oncologist and see how he tolerates dialysis today, then make a decision from there re: residential hospice vs. Home hospice vs. LTAC with continued dialysis. Ammonia level normal. - Oncology consult placed, Dr. Benay Spice - Appreciate very much palliative recs, they will follow as goals of care evolve - DNR/DNI - Pain control with oxycodone prn  #Hyperkalemia - K 6.3 this am, but specimen was hemolyzed. Repeat 5.4. EKG reviewed and no QRS prolongation or peaked T waves. - Gave albuterol treatment early - HD today  #Constipation - Patient endorses crampy abdominal pain earlier today. He has not had a bowel movement in several days, and is not taking good po. Abdomen is distended, but quite soft and non-tender on exam. He is afebrile, and his sinus tachycardia is improving as below. Do not suspect SBP at this time. - Miralax now - Senna QHS  #Tachyarrhythmias - Improving, HR 100s currently. Patient asymptomatic. EP  has seen him already this admission regarding episodes of SVT and determined that at this time he is a poor candidate for ablation. - Continue telemetry - Continue metoprolol to 150mg  BID - Continue amiodarone 200mg  BID; plan decrease to 200mg  daily after 2 weeks of therapy (~05/13/13); low dose was started because there is potential for interaction with sirolimus - IV lopressor prn  - Will not adjust Synthroid yet as amio may make him hyper or more hypo.   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 Now on HD MWF. Patient is still contemplating whether he wants to continue dialysis or pursue hospice. He wanted to shorten treatment time last HD. He will try again today. - Continue anti-rejection meds, will monitor for recovery of the transplanted kidney, but likely this will not occur per renal; nephrology plans to taper these meds   #  Volume overload - Stable. He still has abdominal distention, likely from liver pathology given CT results, but his lower extremity edema has resolved and lung exam is unchanged. - Appreciate Nephrology's recommendations, MWF dialysis schedule - Continue Lopressor, isordil  - Daily weights > 178 > 179 (dry weight 180) - I&Os > +40cc yesterday > -10.6L since admission  #B/L pleural effusions - Dyspnea has improved. Thoracentesis was completed 02/22, analysis consistent with transudate, likely 2/2 CHF vs liver pathology. Cytology report did show atypical cells, staining pattern favors reactive mesothelial cells, but cannot exclude neoplasia. - Oxygen supplementation prn  - Incentive spirometer  - Albuterol nebs q6h   #HCAP, resolved - Antibiotic treatment completed. No recent fevers and no leukocytosis. Final blood cultures - no growth.  - Oxygen supplementation prn  - Albuterol nebs prn q6h   #Acute on chronic anemia -  He has anemia of chronic disease. He got RBCs during dialysis 3/2. Goal hemoglobin 10-11. Patient has several  oozing superficial skin wounds and thrombocytopenia as below. - Continue to monitor  Hemoglobin  Date Value Ref Range Status  05/12/2013 10.8* 13.0 - 17.0 g/dL Final  05/11/2013 8.7* 13.0 - 17.0 g/dL Final  05/10/2013 10.0* 13.0 - 17.0 g/dL Final  05/09/2013 10.5* 13.0 - 17.0 g/dL Final     DELTA CHECK NOTED  05/09/2013 13.9  13.0 - 17.0 g/dL Final     REPEATED TO VERIFY     DELTA CHECK NOTED     POST TRANSFUSION SPECIMEN    #Thrombocytopenia - Platelets continue to slowly decline. We have stopped the Lovenox. Could also be 2/2 reduced liver synthetic function given CT findings. Sequela of superficial bleeding on exam. Trend below. - Place and maintain SCDs - CBC in am  Platelets  Date Value Ref Range Status  05/12/2013 65* 150 - 400 K/uL Final     REPEATED TO VERIFY     CONSISTENT WITH PREVIOUS RESULT  05/12/2013 PLATELET CLUMPS NOTED ON SMEAR  150 - 400 K/uL Final  05/11/2013 73* 150 - 400 K/uL Final     CONSISTENT WITH PREVIOUS RESULT  05/10/2013 88* 150 - 400 K/uL Final     CONSISTENT WITH PREVIOUS RESULT  05/09/2013 PLATELET CLUMPS NOTED ON SMEAR, UNABLE TO ESTIMATE  150 - 400 K/uL Final  05/09/2013 PLATELET CLUMPS NOTED ON SMEAR, COUNT APPEARS DECREASED  150 - 400 K/uL Final     LARGE PLATELETS PRESENT  05/08/2013 137* 150 - 400 K/uL Final  05/07/2013 135* 150 - 400 K/uL Final     PLATELET COUNT CONFIRMED BY SMEAR    #Hypocalcemia - Improving with supplementation. - Replacing Calcium with TUMS and PhosLo  Calcium  Date Value Ref Range Status  05/12/2013 8.7  8.4 - 10.5 mg/dL Final  05/12/2013 8.2* 8.4 - 10.5 mg/dL Final  05/11/2013 8.1* 8.4 - 10.5 mg/dL Final  05/10/2013 8.0* 8.4 - 10.5 mg/dL Final  05/09/2013 8.7  8.4 - 10.5 mg/dL Final    #DM type 2 - No further episodes of hypoglycemia since decreasing Lantus. - Continue Lantus 10 units qHS  - SSI sensitive  - Checking CBGs AC and QHS  Glucose-Capillary  Date Value Ref Range Status  05/12/2013 167* 70 - 99 mg/dL Final  05/11/2013 158* 70 -  99 mg/dL Final  05/11/2013 94  70 - 99 mg/dL Final  05/11/2013 177* 70 - 99 mg/dL Final  05/11/2013 129* 70 - 99 mg/dL Final    #HTN - Stable. Currently normotensive. - Continued Lopressor, isordil  #  H/o prostate cancer with T10 lesion noted on prior MRI - T10 biopsy done inpatient on 02/18 - revealed necrosis and the appearance of "Ghost cells" that are likely malignant.  - Continue Oxycodone IR 10mg  q 4h PRN - D/c Tylenol given liver issues - Appreciate urology recommendations, started Firmagon injections 240 mg subcutaneous on 3/2  #Unstageable pressure ulcer, sacral - Patient aware of ulcer prior to admission. Wound care consulted and recommendations appreciated.  - Twice daily cleanse with normal saline and pat dry, debridement with Santyl, moist NS dressing (pack into wound), secure with dry 4x4 and tape  - Pressure redistribution pad   #Severe malnutrition in the context of chronic illness - Patient is not tolerating good po. - Continue Glucerna shakes, multivitamin.   #DVT ppx - SCDs   Dispo: Disposition is deferred at this time, awaiting improvement of current medical problems.  Anticipated discharge in approximately 1-5 day(s).   The patient does have a current PCP (Sueanne Margarita, MD) and does not need an [facility] hospital follow-up appointment after discharge.  The patient does not have transportation limitations that hinder transportation to clinic appointments.  .Services Needed at time of discharge: Y = Yes, Blank = No PT: LTAC vs. Hospice  OT:   RN:   Equipment: Pressure redistribution pad  Other:     LOS: 21 days   Lesly Dukes, MD 05/12/2013, 11:22 AM  Lesly Dukes, MD  [email or web address] Pager # [phone] Office # [phone]

## 2013-05-12 NOTE — Progress Notes (Signed)
Notified MD Wilforn of critical potassium level of 6.3 but that lab stated its was slightly hemolyzed; new orders given

## 2013-05-12 NOTE — Progress Notes (Signed)
For dialysis today.  Still has significant volume excess on exam with presacral edema and ascites.  Myoclonus worse today but not as bad as it had been. He agrees to dialysis today. Will check ammonia level.  He is working with PT. Await Oncology evaluation and possible Hospice evaluation. Joshua [name]

## 2013-05-12 NOTE — Procedures (Signed)
Tolerating hemodialysis treatment w/o instability. Joshua [name]

## 2013-05-12 NOTE — Progress Notes (Addendum)
NUTRITION FOLLOW UP  DOCUMENTATION CODES Per approved criteria  -Severe malnutrition in the context of chronic illness   Intervention:    Continue Glucerna Shake po BID, each supplement provides 220 kcal and 10 grams of protein  Continue Nepro Carb Steady po PRN, each supplement provides 425 kcals, 19.1 gm protein  RD to follow for nutrition care plan  Nutrition Dx:   Inadequate oral intake related to chronic illness as evidenced by weight loss and decreased intake, ongoing  Goal:   Pt to meet >/= 90% of their estimated nutrition needs, unmet  Monitor:   PO & supplemental intake, goals of care, weight, labs, I/O's  Assessment:   Patient with PMH of HTN, DM-II, CAD, hx of paillary thyroid cancer, kidney transplantation, carotid artery stenosis, PVD, and prostate cancer. Recently hospitalized 2 weeks ago. Patient presents edema and dyspnea x 3 days.   S/p T10 bone biopsy and kyphoplasty done on 04/26/13.  Chart reviewed.  Palliative Care Team following.  PO intake poor at 0-25% per flowsheet records.  Pt has MVI and oral nutrition supplements in place.  Nephrology following for HD.  Possible transition to comfort care only.  Family interested, however, in Oncology's opinion of his liver masses/lesions.  Height: Ht Readings from Last 1 Encounters:  04/21/13 5\' 11"  (1.803 m)    Weight Status:   Wt Readings from Last 1 Encounters:  05/12/13 179 lb 7.3 oz (81.4 kg)    Re-estimated needs:  Kcal: 2000-2300  Protein: 90-100 grams  Fluid: 1.2 L restriction per MD  Skin: +1 RLE and LLE edema; unstageable pressure ulcer on sacrum  Diet Order: Carb Control   Intake/Output Summary (Last 24 hours) at 05/12/13 1440 Last data filed at 05/12/13 1339  Gross per 24 hour  Intake    120 ml  Output    400 ml  Net   -280 ml    Labs:   Recent Labs Lab 05/11/13 0240 05/12/13 0355 05/12/13 0637  NA 135* 132* 135*  K 4.3 6.3* 5.4*  CL 89* 86* 87*  CO2 26 20 26   BUN 38* 50*  52*  CREATININE 2.99* 3.58* 3.61*  CALCIUM 8.1* 8.2* 8.7  GLUCOSE 138* 159* 164*    CBG (last 3)   Recent Labs  05/11/13 1603 05/11/13 2108 05/12/13 1056  GLUCAP 94 158* 167*    Scheduled Meds: . amiodarone  200 mg Oral BID  . antiseptic oral rinse  15 mL Mouth Rinse BID  . aspirin EC  81 mg Oral Daily  . atorvastatin  80 mg Oral q1800  . calcium acetate  2,001 mg Oral TID WC  . calcium carbonate  800 mg of elemental calcium Oral QHS  . collagenase   Topical Daily  . feeding supplement (GLUCERNA SHAKE)  237 mL Oral BID BM  . insulin aspart  0-9 Units Subcutaneous TID WC  . insulin glargine  10 Units Subcutaneous QHS  . isosorbide dinitrate  20 mg Oral BID  . levothyroxine  150 mcg Oral QAC breakfast  . metoprolol  150 mg Oral BID  . multivitamin with minerals  1 tablet Oral Daily  . mycophenolate  720 mg Oral BID  . oxyCODONE  10 mg Oral NOW  . pantoprazole  40 mg Oral Daily  . sirolimus  1 mg Oral q morning - 10a    Continuous Infusions: . sodium chloride Stopped (05/08/13 0831)    Arthur Holms, RD, LDN Pager #: [phone] After-Hours Pager #: [phone]

## 2013-05-12 NOTE — Progress Notes (Signed)
  Date: 05/12/2013  Patient name: Joshua [name]  Medical record number: [id number]  Date of birth: 11/28/1950   This patient has been seen and the plan of care was discussed with the house staff. Please see their note for complete details. I concur with their findings with the following additions/corrections: His wife and daughters are in the room today. Discussed with them the current findings and they are interested in oncology opinion of his liver masses/lesions. He is arousable, but does not participate much in history. At this time, await oncology opinion. They are still interested in possible comfort care. He will be going to HD today, his K will correct then.  Dominic Pea, DO, [facility] 05/12/2013, 12:10 PM

## 2013-05-13 LAB — CBC
HCT: 30.4 % — ABNORMAL LOW (ref 39.0–52.0)
HEMOGLOBIN: 9.6 g/dL — AB (ref 13.0–17.0)
MCH: 26.1 pg (ref 26.0–34.0)
MCHC: 31.6 g/dL (ref 30.0–36.0)
MCV: 82.6 fL (ref 78.0–100.0)
Platelets: 58 10*3/uL — ABNORMAL LOW (ref 150–400)
RBC: 3.68 MIL/uL — ABNORMAL LOW (ref 4.22–5.81)
RDW: 23.3 % — ABNORMAL HIGH (ref 11.5–15.5)
WBC: 8.4 10*3/uL (ref 4.0–10.5)

## 2013-05-13 LAB — GLUCOSE, CAPILLARY
GLUCOSE-CAPILLARY: 154 mg/dL — AB (ref 70–99)
GLUCOSE-CAPILLARY: 201 mg/dL — AB (ref 70–99)
Glucose-Capillary: 170 mg/dL — ABNORMAL HIGH (ref 70–99)
Glucose-Capillary: 170 mg/dL — ABNORMAL HIGH (ref 70–99)
Glucose-Capillary: 187 mg/dL — ABNORMAL HIGH (ref 70–99)

## 2013-05-13 LAB — COMPREHENSIVE METABOLIC PANEL
ALK PHOS: 744 U/L — AB (ref 39–117)
ALT: 62 U/L — ABNORMAL HIGH (ref 0–53)
AST: 218 U/L — ABNORMAL HIGH (ref 0–37)
Albumin: 1.7 g/dL — ABNORMAL LOW (ref 3.5–5.2)
BILIRUBIN TOTAL: 0.6 mg/dL (ref 0.3–1.2)
BUN: 40 mg/dL — AB (ref 6–23)
CHLORIDE: 90 meq/L — AB (ref 96–112)
CO2: 22 mEq/L (ref 19–32)
Calcium: 8.8 mg/dL (ref 8.4–10.5)
Creatinine, Ser: 2.95 mg/dL — ABNORMAL HIGH (ref 0.50–1.35)
GFR calc non Af Amer: 21 mL/min — ABNORMAL LOW (ref >90)
GFR, EST AFRICAN AMERICAN: 25 mL/min — AB (ref >90)
Glucose, Bld: 154 mg/dL — ABNORMAL HIGH (ref 70–99)
POTASSIUM: 5 meq/L (ref 3.7–5.3)
Sodium: 137 mEq/L (ref 137–147)
Total Protein: 6.5 g/dL (ref 6.0–8.3)

## 2013-05-13 NOTE — Progress Notes (Signed)
Admit: 04/21/2013 LOS: 73  33M hx/o Kidney Transplant admitted with AMS, edema, found to have new ascites and liver mets with hx/o thyroid and prostate CA.  Restarted RRT for ? Uremia and failed transplant  Subjective:  HD yesterday, full treatment Family still considering treatment options Frankly discussed limited benefits that RRT can provide at significant potential for discomfort   03/06 0701 - 03/07 0700 In: -  Out: 1539 [Urine:200]  Filed Weights   05/11/13 0500 05/12/13 0611 05/12/13 1754  Weight: 80.9 kg (178 lb 5.6 oz) 81.4 kg (179 lb 7.3 oz) 72.3 kg (159 lb 6.3 oz)    Current meds: reviewed including sirolimus 1 daily, MMF 720BID Current Labs: reviewed    Physical Exam:  Blood pressure 102/67, pulse 98, temperature 97.7 F (36.5 C), temperature source Oral, resp. rate 18, height 5\' 11"  (1.803 m), weight 72.3 kg (159 lb 6.3 oz), SpO2 100.00%. AMS, chronically ill appearing, NAD RRR CTAB No LEE Distended abdomen, soft L UA AVF +B/T No AAO Muddly sclera  Assessment 1. S/p kidney transplant, restarted RRT this admission for volume overload, uremia 2. Immunosuppression with MMF, sirolimus 3. Liver metastases, ascites 4. Hx/o prostate and thyroid cancer 5. Hyperkalemia, resolved with HD  Plan 1. No HD over weekend 2. Cont to talk with family about what the achievable goals are with HD and if worth discomfort and risk.  At this time I don't think he will do well, and is not a suitable outpatient HD candidate 3. Daily renal panel 4. Oncology evaluation pending 5. I support a palliative, comfort based approach  Pearson Grippe MD 05/13/2013, 7:53 AM   Recent Labs Lab 05/12/13 0355 05/12/13 0637 05/13/13 0404  NA 132* 135* 137  K 6.3* 5.4* 5.0  CL 86* 87* 90*  CO2 20 26 22   GLUCOSE 159* 164* 154*  BUN 50* 52* 40*  CREATININE 3.58* 3.61* 2.95*  CALCIUM 8.2* 8.7 8.8    Recent Labs Lab 05/11/13 0240 05/12/13 0355 05/12/13 0758 05/13/13 0404  WBC 9.3  8.9  --  8.4  HGB 8.7* 10.8*  --  9.6*  HCT 27.9* 33.5*  --  30.4*  MCV 80.9 80.0  --  82.6  PLT 73* PLATELET CLUMPS NOTED ON SMEAR 65* 58*

## 2013-05-13 NOTE — Progress Notes (Signed)
Patients BP 88/58 MD called (RAI)  Patient asymptomatic. . Will inform on coming nurse .

## 2013-05-13 NOTE — Progress Notes (Signed)
I have reviewed this case with our NP and agree with the Assessment and Plan as stated.  Dorethia Jeanmarie L. Aldair Rickel, MD MBA The Palliative Medicine Team at [facility] Team Phone: [phone] Pager: [phone]   

## 2013-05-13 NOTE — Progress Notes (Signed)
Subjective: He is less interactive today, per family and nursing he has not been eating much since yesterday. He denies any pain.   Objective: Vital signs in last 24 hours: Filed Vitals:   05/13/13 0424 05/13/13 1435 05/13/13 1854 05/13/13 2100  BP: 102/67  88/58 101/81  Pulse: 98 71  75  Temp: 97.7 F (36.5 C) 96.9 F (36.1 C)  97.2 F (36.2 C)  TempSrc:  Oral  Oral  Resp: 18 18  17   Height:      Weight:      SpO2: 100% 93%  92%   Weight change: -20 lb 1 oz (-9.1 kg)  Intake/Output Summary (Last 24 hours) at 05/13/13 2158 Last data filed at 05/13/13 1300  Gross per 24 hour  Intake    480 ml  Output      0 ml  Net    480 ml   General: Sitting in bed in NAD, occasional twitching of his bilateral shoulder/arm, cachectic, nonverbal but nods no when asked if he has pain, one of his daughter's at his bedside Eyes: Jaundiced  Cardiac: Tachycardia with regular rhythm, no rubs, murmur or gallops  Pulm: CTAB  Abd: continued significant abdominal distention, but soft with no tenderness to palpation, no guarding, BS present  Ext: warm and well perfused, no lower extremity edema  Neuro: alert and oriented X3, follows commands appropriately, moves all 4 extremities voluntairly  Lab Results: Basic Metabolic Panel:  Recent Labs Lab 05/12/13 0637 05/13/13 0404  NA 135* 137  K 5.4* 5.0  CL 87* 90*  CO2 26 22  GLUCOSE 164* 154*  BUN 52* 40*  CREATININE 3.61* 2.95*  CALCIUM 8.7 8.8   Liver Function Tests:  Recent Labs Lab 05/09/13 0655 05/13/13 0404  AST 137* 218*  ALT 38 62*  ALKPHOS 586* 744*  BILITOT 0.7 0.6  PROT 7.6 6.5  ALBUMIN 2.1* 1.7*    Recent Labs Lab 05/11/13 0930 05/12/13 1528  AMMONIA 30 64*   CBC:  Recent Labs Lab 05/12/13 0355 05/12/13 0758 05/13/13 0404  WBC 8.9  --  8.4  HGB 10.8*  --  9.6*  HCT 33.5*  --  30.4*  MCV 80.0  --  82.6  PLT PLATELET CLUMPS NOTED ON SMEAR 65* 58*   Cardiac Enzymes:  Recent Labs Lab 05/10/13 0332  05/10/13 1015 05/10/13 1600  TROPONINI <0.30 <0.30 <0.30   CBG:  Recent Labs Lab 05/12/13 1904 05/12/13 2119 05/13/13 0009 05/13/13 0604 05/13/13 1131 05/13/13 1609  GLUCAP 157* 172* 154* 170* 170* 187*    Medications: I have reviewed the patient's current medications. Scheduled Meds: . amiodarone  200 mg Oral BID  . antiseptic oral rinse  15 mL Mouth Rinse BID  . aspirin EC  81 mg Oral Daily  . atorvastatin  80 mg Oral q1800  . calcium acetate  2,001 mg Oral TID WC  . calcium carbonate  800 mg of elemental calcium Oral QHS  . collagenase   Topical Daily  . feeding supplement (GLUCERNA SHAKE)  237 mL Oral BID BM  . insulin aspart  0-9 Units Subcutaneous TID WC  . insulin glargine  10 Units Subcutaneous QHS  . isosorbide dinitrate  20 mg Oral BID  . levothyroxine  150 mcg Oral QAC breakfast  . lidocaine  1 patch Transdermal QAC breakfast  . metoprolol  150 mg Oral BID  . multivitamin with minerals  1 tablet Oral Daily  . mycophenolate  720 mg Oral BID  .  pantoprazole  40 mg Oral Daily  . sirolimus  1 mg Oral q morning - 10a   Continuous Infusions: . sodium chloride Stopped (05/08/13 0831)   PRN Meds:.albuterol, ALPRAZolam, calamine, diphenhydrAMINE, lip balm, oxyCODONE, senna-docusate Assessment/Plan: 63 year old male with a PMH of HTN, DM2, CAD (s/p MI and LAD stent), kidney transplantation, carotid artery stenosis (s/p CEA 08/2011), PVD (s/p fem-pop in 1980), dHF (EF 45-50% Jan 2015), hx of papillary thyroid cancer and prostate cancer who presents with complaint of edema x 3 days and dyspnea for several weeks.   #New liver masses on CT - Palliative care family meeting on 3/5. The patient himself expressed interest in more of a comfort care approach, wheras family members had many questions about the liver lesions, how long they had been there and what "stage" of cancer the may represent. At this point the patient does NOT want to pursue further testing (i.e. biopsy) nor  aggressive treatment options. However, family would like to at least talk to a cancer specialist, which we have arranged. Overall, the plan is to talk to the oncologist and see how he tolerates dialysis on Monday then make a decision from there re: residential hospice vs. Home hospice vs. LTAC with continued dialysis. Ammonia level normal.  - Oncology consult placed, yet to see patient   - Appreciate very much palliative recs, they will follow as goals of care evolve  - DNR/DNI  - Pain control with oxycodone prn   #Hyperkalemia - K of 5.0 this am.   - Repeat BMET in am   #Constipation - Unclear if he has had a BM overnight. Patient had crampy abdominal pain yesterda. He had not had a bowel movement in several days, and is not taking good po. Abdomen is distended, but quite soft and non-tender on exam. He is afebrile, and his sinus tachycardia is improving as below. Do not suspect SBP at this time.  - Senna-docusate PRN for constipation  #Tachyarrhythmias - Improving, HR 70s, had been in the 100s with patient asymptomatic. EP has seen him already this admission regarding episodes of SVT and determined that at this time he is a poor candidate for ablation.  - Continue telemetry  - Continue metoprolol to 150mg  BID  - Continue amiodarone 200mg  BID; plan decrease to 200mg  daily after 2 weeks of therapy (~05/13/13); low dose was started because there is potential for interaction with sirolimus  - IV lopressor prn  - Will not adjust Synthroid yet as amio may make him hyper or more hypo.   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 Now on HD MWF. Patient is still contemplating whether he wants to continue dialysis or pursue hospice. He wanted to shorten treatment time last HD. Nephrology following with plan for HD on Monday.  - Continue anti-rejection meds, will monitor for recovery of the transplanted kidney, but likely this will not occur per renal; nephrology plans to  taper these meds   #Volume overload - Stable. He still has abdominal distention, likely from liver pathology given CT results, but his lower extremity edema has resolved and lung exam is unchanged.  - Appreciate Nephrology's recommendations, MWF dialysis schedule  - Continue Lopressor, isordil  - Daily weights > 178 > 179 (dry weight 180)  - I&Os > +40cc yesterday > -10.6L since admission   #B/L pleural effusions - Dyspnea has improved. Thoracentesis was completed 02/22, analysis consistent with transudate, likely 2/2 CHF vs liver pathology. Cytology report did  show atypical cells, staining pattern favors reactive mesothelial cells, but cannot exclude neoplasia.  - Oxygen supplementation prn  - Incentive spirometer  - Albuterol nebs q6h   #HCAP, resolved - Antibiotic treatment completed. No recent fevers and no leukocytosis. Final blood cultures - no growth.  - Oxygen supplementation prn  - Albuterol nebs prn q6h   #Acute on chronic anemia - He has anemia of chronic disease. He got RBCs during dialysis 3/2. Goal hemoglobin 10-11. Patient has several oozing superficial skin wounds and thrombocytopenia. Hg stable today at 9.6, trend as below.  - Continue to monitor  Hemoglobin   Date  Value  Ref Range  Status   05/12/2013  10.8*  13.0 - 17.0 g/dL  Final   05/11/2013  8.7*  13.0 - 17.0 g/dL  Final   05/10/2013  10.0*  13.0 - 17.0 g/dL  Final   05/09/2013  10.5*  13.0 - 17.0 g/dL  Final   DELTA CHECK NOTED   05/09/2013  13.9  13.0 - 17.0 g/dL  Final   REPEATED TO VERIFY   DELTA CHECK NOTED   POST TRANSFUSION SPECIMEN    #Thrombocytopenia - Platelets continue to slowly decline. We have stopped the Lovenox. Could also be 2/2 reduced liver synthetic function given CT findings. Sequela of superficial bleeding on exam.  Platelet at 58 today. Trend below.  - Place and maintain SCDs  - CBC in am  Platelets   Date  Value  Ref Range  Status   05/12/2013  65*  150 - 400 K/uL  Final   REPEATED TO VERIFY     CONSISTENT WITH PREVIOUS RESULT   05/12/2013  PLATELET CLUMPS NOTED ON SMEAR  150 - 400 K/uL  Final   05/11/2013  73*  150 - 400 K/uL  Final   CONSISTENT WITH PREVIOUS RESULT   05/10/2013  88*  150 - 400 K/uL  Final   CONSISTENT WITH PREVIOUS RESULT   05/09/2013  PLATELET CLUMPS NOTED ON SMEAR, UNABLE TO ESTIMATE  150 - 400 K/uL  Final   05/09/2013  PLATELET CLUMPS NOTED ON SMEAR, COUNT APPEARS DECREASED  150 - 400 K/uL  Final   LARGE PLATELETS PRESENT   05/08/2013  137*  150 - 400 K/uL  Final   05/07/2013  135*  150 - 400 K/uL  Final   PLATELET COUNT CONFIRMED BY SMEAR    #Hypocalcemia - Improving with supplementation.  - Replacing Calcium with TUMS and PhosLo  Calcium   Date  Value  Ref Range  Status   05/12/2013  8.7  8.4 - 10.5 mg/dL  Final   05/12/2013  8.2*  8.4 - 10.5 mg/dL  Final   05/11/2013  8.1*  8.4 - 10.5 mg/dL  Final   05/10/2013  8.0*  8.4 - 10.5 mg/dL  Final   05/09/2013  8.7  8.4 - 10.5 mg/dL  Final    #DM type 2 - No further episodes of hypoglycemia since decreasing Lantus.  - Continue Lantus 10 units qHS  - SSI sensitive  - Checking CBGs AC and QHS   #HTN - Stable. BP trending down this pm. May hold anti-hypertensive if persistently low BP (would continue BB for SVT) - Continued Lopressor, isordil   #H/o prostate cancer with T10 lesion noted on prior MRI - T10 biopsy done inpatient on 02/18 - revealed necrosis and the appearance of "Ghost cells" that are likely malignant.  - Continue Oxycodone IR 10mg  q 4h PRN  - D/c Tylenol given  liver issues  - Appreciate urology recommendations, started Firmagon injections 240 mg subcutaneous on 3/2   #Unstageable pressure ulcer, sacral - Patient aware of ulcer prior to admission. Wound care consulted and recommendations appreciated.  - Twice daily cleanse with normal saline and pat dry, debridement with Santyl, moist NS dressing (pack into wound), secure with dry 4x4 and tape  - Pressure redistribution pad   #Severe malnutrition in the  context of chronic illness - Patient is not tolerating good po.  - Continue Glucerna shakes, multivitamin.   #DVT ppx - SCDs    Dispo: Disposition is deferred at this time, awaiting improvement of current medical problems.  Anticipated discharge in approximately 2-3 day(s).   The patient does have a current PCP (Sueanne Margarita, MD) and does not need an [facility] hospital follow-up appointment after discharge.  The patient does not have transportation limitations that hinder transportation to clinic appointments.  .Services Needed at time of discharge: Y = Yes, Blank = No PT:   OT:   RN:   Equipment:   Other:     LOS: 22 days   Blain Pais, MD 05/13/2013, 8:01PM

## 2013-05-14 DIAGNOSIS — R0602 Shortness of breath: Secondary | ICD-10-CM

## 2013-05-14 DIAGNOSIS — N186 End stage renal disease: Secondary | ICD-10-CM

## 2013-05-14 DIAGNOSIS — C61 Malignant neoplasm of prostate: Secondary | ICD-10-CM

## 2013-05-14 DIAGNOSIS — R109 Unspecified abdominal pain: Secondary | ICD-10-CM

## 2013-05-14 DIAGNOSIS — C7951 Secondary malignant neoplasm of bone: Secondary | ICD-10-CM

## 2013-05-14 DIAGNOSIS — Z992 Dependence on renal dialysis: Secondary | ICD-10-CM

## 2013-05-14 DIAGNOSIS — C7952 Secondary malignant neoplasm of bone marrow: Secondary | ICD-10-CM

## 2013-05-14 DIAGNOSIS — R609 Edema, unspecified: Secondary | ICD-10-CM

## 2013-05-14 DIAGNOSIS — K7689 Other specified diseases of liver: Secondary | ICD-10-CM

## 2013-05-14 LAB — COMPREHENSIVE METABOLIC PANEL
ALT: 103 U/L — ABNORMAL HIGH (ref 0–53)
AST: 398 U/L — AB (ref 0–37)
Albumin: 1.5 g/dL — ABNORMAL LOW (ref 3.5–5.2)
Alkaline Phosphatase: 864 U/L — ABNORMAL HIGH (ref 39–117)
BUN: 60 mg/dL — ABNORMAL HIGH (ref 6–23)
CO2: 23 meq/L (ref 19–32)
CREATININE: 3.68 mg/dL — AB (ref 0.50–1.35)
Calcium: 8.9 mg/dL (ref 8.4–10.5)
Chloride: 92 mEq/L — ABNORMAL LOW (ref 96–112)
GFR, EST AFRICAN AMERICAN: 19 mL/min — AB (ref >90)
GFR, EST NON AFRICAN AMERICAN: 16 mL/min — AB (ref >90)
Glucose, Bld: 210 mg/dL — ABNORMAL HIGH (ref 70–99)
Potassium: 5.1 mEq/L (ref 3.7–5.3)
Sodium: 141 mEq/L (ref 137–147)
Total Bilirubin: 0.5 mg/dL (ref 0.3–1.2)
Total Protein: 6 g/dL (ref 6.0–8.3)

## 2013-05-14 LAB — CBC
HCT: 28.5 % — ABNORMAL LOW (ref 39.0–52.0)
Hemoglobin: 8.8 g/dL — ABNORMAL LOW (ref 13.0–17.0)
MCH: 25.3 pg — AB (ref 26.0–34.0)
MCHC: 30.9 g/dL (ref 30.0–36.0)
MCV: 81.9 fL (ref 78.0–100.0)
PLATELETS: 48 10*3/uL — AB (ref 150–400)
RBC: 3.48 MIL/uL — AB (ref 4.22–5.81)
RDW: 23.6 % — ABNORMAL HIGH (ref 11.5–15.5)
WBC: 6.7 10*3/uL (ref 4.0–10.5)

## 2013-05-14 LAB — PROTIME-INR
INR: 1.64 — AB (ref 0.00–1.49)
Prothrombin Time: 19 seconds — ABNORMAL HIGH (ref 11.6–15.2)

## 2013-05-14 LAB — GLUCOSE, CAPILLARY
Glucose-Capillary: 229 mg/dL — ABNORMAL HIGH (ref 70–99)
Glucose-Capillary: 162 mg/dL — ABNORMAL HIGH (ref 70–99)
Glucose-Capillary: 156 mg/dL — ABNORMAL HIGH (ref 70–99)

## 2013-05-14 MED ORDER — AMIODARONE HCL 200 MG PO TABS
200.0000 mg | ORAL_TABLET | Freq: Every day | ORAL | Status: DC
  Administered 2013-05-14: 200 mg via ORAL
  Filled 2013-05-14 (×2): qty 1

## 2013-05-14 NOTE — Progress Notes (Signed)
Admit: 04/21/2013 LOS: 23  89M hx/o Kidney Transplant admitted with AMS, edema, found to have new ascites and liver mets with hx/o thyroid and prostate CA.  Restarted RRT for ? Uremia and failed transplant -- remains on MMF/Rapa  Subjective:  NAEON Eating and drinking poorly Family awaits oncology visit They remain undecided about long term dialysis  03/07 0701 - 03/08 0700 In: 480 [P.O.:480] Out: -   Filed Weights   05/12/13 0611 05/12/13 1754 05/14/13 0700  Weight: 81.4 kg (179 lb 7.3 oz) 72.3 kg (159 lb 6.3 oz) 74.8 kg (164 lb 14.5 oz)    Current meds: reviewed including sirolimus 1 daily, MMF 720BID Current Labs: reviewed    Physical Exam:  Blood pressure 90/50, pulse 106, temperature 97.7 F (36.5 C), temperature source Oral, resp. rate 18, height 5\' 11"  (1.803 m), weight 74.8 kg (164 lb 14.5 oz), SpO2 92.00%. AMS, chronically ill appearing, NAD RRR CTAB No LEE Distended abdomen, soft L UA AVF +B/T No AAO [place] sclera  Assessment 1. S/p kidney transplant, restarted RRT this admission for volume overload, uremia 2. Immunosuppression with MMF, sirolimus 3. Liver metastases, ascites 4. Hx/o prostate and thyroid cancer 5. Hyperkalemia, resolved with HD  Plan 1. No HD over weekend, tentatively planned for tomorrow 2. Cont to talk with family about what the achievable goals are with HD and if worth discomfort and risk.  At this time I don't think he will do well, and is not a suitable outpatient HD candidate 3. Daily renal panel 4. Oncology evaluation pending 5. I support a palliative, comfort based approach  Pearson Grippe MD 05/14/2013, 8:01 AM   Recent Labs Lab 05/12/13 0637 05/13/13 0404 05/14/13 0425  NA 135* 137 141  K 5.4* 5.0 5.1  CL 87* 90* 92*  CO2 26 22 23   GLUCOSE 164* 154* 210*  BUN 52* 40* 60*  CREATININE 3.61* 2.95* 3.68*  CALCIUM 8.7 8.8 8.9    Recent Labs Lab 05/12/13 0355 05/12/13 0758 05/13/13 0404 05/14/13 0425  WBC 8.9  --  8.4  6.7  HGB 10.8*  --  9.6* 8.8*  HCT 33.5*  --  30.4* 28.5*  MCV 80.0  --  82.6 81.9  PLT PLATELET CLUMPS NOTED ON SMEAR 65* 58* 48*

## 2013-05-14 NOTE — Progress Notes (Signed)
Subjective: Has persistent decreased appetite with minimum intake per mouth. Hypotensive overnight with BP in 80s/50s. Oncology yet to see him, family awaiting this consult to make final decision in regards to [place].   Objective: Vital signs in last 24 hours: Filed Vitals:   05/13/13 2100 05/14/13 0507 05/14/13 0654 05/14/13 0700  BP: 101/81 82/41 90/50    Pulse: 75 101 106   Temp: 97.2 F (36.2 C) 97.7 F (36.5 C)    TempSrc: Oral Oral    Resp: 17 18    Height:      Weight:    164 lb 14.5 oz (74.8 kg)  SpO2: 92% 92%     Weight change: 5 lb 8.2 oz (2.5 kg)  Intake/Output Summary (Last 24 hours) at 05/14/13 0908 Last data filed at 05/13/13 1300  Gross per 24 hour  Intake    120 ml  Output      0 ml  Net    120 ml  Vitals reviewed General: Sitting in bed in NAD, occasional twitching of his bilateral shoulder/arm, cachectic, nonverbal but nods "no" when asked if he has pain, one of his daughter's at his bedside  Eyes: Jaundiced  Cardiac:  Tachycardia, regular and rhythm, no rubs, murmur or gallops  Pulm: CTAB, good air movement, no respiratory distress Abd: continued significant abdominal distention, but soft with no tenderness to palpation, no guarding, BS present  Ext: warm and well perfused, no lower extremity edema  Neuro: alert, oriented to place and person, slow to follow commands, moves all 4 extremities voluntairly     Lab Results: Basic Metabolic Panel:  Recent Labs Lab 05/13/13 0404 05/14/13 0425  NA 137 141  K 5.0 5.1  CL 90* 92*  CO2 22 23  GLUCOSE 154* 210*  BUN 40* 60*  CREATININE 2.95* 3.68*  CALCIUM 8.8 8.9   Liver Function Tests:  Recent Labs Lab 05/13/13 0404 05/14/13 0425  AST 218* 398*  ALT 62* 103*  ALKPHOS 744* 864*  BILITOT 0.6 0.5  PROT 6.5 6.0  ALBUMIN 1.7* 1.5*    Recent Labs Lab 05/11/13 0930 05/12/13 1528  AMMONIA 30 64*   CBC:  Recent Labs Lab 05/13/13 0404 05/14/13 0425  WBC 8.4 6.7  HGB 9.6* 8.8*  HCT 30.4*  28.5*  MCV 82.6 81.9  PLT 58* 48*   Cardiac Enzymes:  Recent Labs Lab 05/10/13 0332 05/10/13 1015 05/10/13 1600  TROPONINI <0.30 <0.30 <0.30   CBG:  Recent Labs Lab 05/13/13 0009 05/13/13 0604 05/13/13 1131 05/13/13 1609 05/13/13 2124 05/14/13 0704  GLUCAP 154* 170* 170* 187* 201* 229*   Coagulation:  Recent Labs Lab 05/14/13 0425  LABPROT 19.0*  INR 1.64*   Medications: I have reviewed the patient's current medications. Scheduled Meds: . amiodarone  200 mg Oral BID  . antiseptic oral rinse  15 mL Mouth Rinse BID  . aspirin EC  81 mg Oral Daily  . atorvastatin  80 mg Oral q1800  . calcium acetate  2,001 mg Oral TID WC  . calcium carbonate  800 mg of elemental calcium Oral QHS  . collagenase   Topical Daily  . feeding supplement (GLUCERNA SHAKE)  237 mL Oral BID BM  . insulin aspart  0-9 Units Subcutaneous TID WC  . insulin glargine  10 Units Subcutaneous QHS  . isosorbide dinitrate  20 mg Oral BID  . levothyroxine  150 mcg Oral QAC breakfast  . lidocaine  1 patch Transdermal QAC breakfast  . metoprolol  150 mg  Oral BID  . multivitamin with minerals  1 tablet Oral Daily  . mycophenolate  720 mg Oral BID  . pantoprazole  40 mg Oral Daily  . sirolimus  1 mg Oral q morning - 10a   Continuous Infusions: . sodium chloride Stopped (05/08/13 0831)   PRN Meds:.albuterol, ALPRAZolam, calamine, diphenhydrAMINE, lip balm, oxyCODONE, senna-docusate Assessment/Plan: 63 year old male with a PMH of HTN, DM2, CAD (s/p MI and LAD stent), kidney transplantation, carotid artery stenosis (s/p CEA 08/2011), PVD (s/p fem-pop in 1980), dHF (EF 45-50% Jan 2015), hx of papillary thyroid cancer and prostate cancer who presents with complaint of edema x 3 days and dyspnea for several weeks.   #New liver masses on CT - Palliative care family meeting on 3/5. The patient himself expressed interest in more of a comfort care approach, wheras family members had many questions about the  liver lesions, how long they had been there and what "stage" of cancer the may represent. At this point the patient does NOT want to pursue further testing (i.e. biopsy) nor aggressive treatment options. However, family would like to at least talk to a cancer specialist, which we have arranged. Overall, the plan is to talk to the oncologist and see how he tolerates dialysis on Monday then make a decision from there re: residential hospice vs. Home hospice vs. LTAC with continued dialysis. Ammonia level normal.  - Oncology consult placed on Friday but has not see patient. Dr. Julien Nordmann to see patient today - Appreciate very much palliative recs, they will follow as goals of care evolve  - DNR/DNI  - Pain control with oxycodone prn   #Hyperkalemia - K of 5.1 this am.  - Repeat BMET in am   #Constipation - Unclear if he has had a BM overnight. Patient had crampy abdominal pain yesterda. He had not had a bowel movement in several days, and is not taking good po. Abdomen is distended, but quite soft and non-tender on exam. He is afebrile, and his sinus tachycardia is improving as below. Do not suspect SBP at this time.  - Senna-docusate PRN for constipation   #Tachyarrhythmias - Improving, R 106 this am, had been in the 120s during this hospitalizaiton with patient asymptomatic. EP has seen him already this admission regarding episodes of SVT and determined that at this time he is a poor candidate for ablation.  - Continue telemetry, will discontinue if family agrees with comfort care - Continue metoprolol to 150mg  BID  - Continue amiodarone but change to 200mg  daily with hypotension (per EP, ok to decrease from BID after two weeks of tx); low dose was started because there is potential for interaction with sirolimus  - Will not adjust Synthroid yet as amio may make him hyper or more hypo.   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 Now on HD MWF. Patient is still  contemplating whether he wants to continue dialysis or pursue hospice. He wanted to shorten treatment time last HD. Nephrology following with plan for HD on Monday.  - Continue anti-rejection meds, will monitor for recovery of the transplanted kidney, but likely this will not occur per renal; nephrology plans to taper these meds   #Volume overload - Stable. He still has abdominal distention, likely from liver pathology given CT results, but his lower extremity edema has resolved and lung exam is unchanged.  - Appreciate Nephrology's recommendations, MWF dialysis schedule  - Continue Lopressor - Holding isordil in setting of  hypotension - Daily weights > 178 > 179 (dry weight 180)  - I&Os > +480cc yesterday > -11.6L since admission   #B/L pleural effusions - Dyspnea has improved. Thoracentesis was completed 02/22, analysis consistent with transudate, likely 2/2 CHF vs liver pathology. Cytology report did show atypical cells, staining pattern favors reactive mesothelial cells, but cannot exclude neoplasia.  - Oxygen supplementation prn  - Incentive spirometer  - Albuterol nebs q6h   #HCAP, resolved - Antibiotic treatment completed. No recent fevers and no leukocytosis. Final blood cultures - no growth.  - Oxygen supplementation prn  - Albuterol nebs prn q6h   #Acute on chronic anemia - He has anemia of chronic disease. He got RBCs during dialysis 3/2. Goal hemoglobin 10-11. Patient has several oozing superficial skin wounds and thrombocytopenia. Hg has gradually trended down, 8.8 today, trend as below.  - Continue to monitor  Hemoglobin   Date  Value  Ref Range  Status   05/12/2013  10.8*  13.0 - 17.0 g/dL  Final   05/11/2013  8.7*  13.0 - 17.0 g/dL  Final   05/10/2013  10.0*  13.0 - 17.0 g/dL  Final   05/09/2013  10.5*  13.0 - 17.0 g/dL  Final   DELTA CHECK NOTED   05/09/2013  13.9  13.0 - 17.0 g/dL  Final   REPEATED TO VERIFY   DELTA CHECK NOTED   POST TRANSFUSION SPECIMEN    #Thrombocytopenia  - Platelets continue to slowly decline. We have stopped the Lovenox. Could also be 2/2 reduced liver synthetic function given CT findings. Sequela of superficial bleeding on exam. Platelet at 48 today. Trend below.  - Place and maintain SCDs  - CBC in am  Platelets   Date  Value  Ref Range  Status   05/12/2013  65*  150 - 400 K/uL  Final   REPEATED TO VERIFY   CONSISTENT WITH PREVIOUS RESULT   05/12/2013  PLATELET CLUMPS NOTED ON SMEAR  150 - 400 K/uL  Final   05/11/2013  73*  150 - 400 K/uL  Final   CONSISTENT WITH PREVIOUS RESULT   05/10/2013  88*  150 - 400 K/uL  Final   CONSISTENT WITH PREVIOUS RESULT   05/09/2013  PLATELET CLUMPS NOTED ON SMEAR, UNABLE TO ESTIMATE  150 - 400 K/uL  Final   05/09/2013  PLATELET CLUMPS NOTED ON SMEAR, COUNT APPEARS DECREASED  150 - 400 K/uL  Final   LARGE PLATELETS PRESENT   05/08/2013  137*  150 - 400 K/uL  Final   05/07/2013  135*  150 - 400 K/uL  Final   PLATELET COUNT CONFIRMED BY SMEAR    #Hypocalcemia - Improving with supplementation.  - Replacing Calcium with TUMS and PhosLo  Calcium   Date  Value  Ref Range  Status   05/12/2013  8.7  8.4 - 10.5 mg/dL  Final   05/12/2013  8.2*  8.4 - 10.5 mg/dL  Final   05/11/2013  8.1*  8.4 - 10.5 mg/dL  Final   05/10/2013  8.0*  8.4 - 10.5 mg/dL  Final   05/09/2013  8.7  8.4 - 10.5 mg/dL  Final    #DM type 2 - No further episodes of hypoglycemia since decreasing Lantus.  - Continue Lantus 10 units qHS  - SSI sensitive  - Checking CBGs AC and QHS   #HTN - Stable. BP trending down this pm. May hold anti-hypertensive if persistently low BP (would continue BB for SVT)  - Continued  Lopressor, isordil  #H/o prostate cancer with T10 lesion noted on prior MRI - T10 biopsy done inpatient on 02/18 - revealed necrosis and the appearance of "Ghost cells" that are likely malignant.  - Continue Oxycodone IR 10mg  q 4h PRN  - D/c Tylenol given liver issues  - Appreciate urology recommendations, started Firmagon injections 240 mg  subcutaneous on 3/2   #Unstageable pressure ulcer, sacral - Patient aware of ulcer prior to admission. Wound care consulted and recommendations appreciated.  - Twice daily cleanse with normal saline and pat dry, debridement with Santyl, moist NS dressing (pack into wound), secure with dry 4x4 and tape  - Pressure redistribution pad   #Severe malnutrition in the context of chronic illness - Patient is not tolerating good po.  - Continue Glucerna shakes, multivitamin.   #DVT ppx - SCDs   Dispo: Disposition is deferred at this time, awaiting improvement of current medical problems and/or further discussion of GOC. Anticipated discharge in approximately 3-4 day(s).   The patient does have a current PCP (Sueanne Margarita, MD) and does not need an [facility] hospital follow-up appointment after discharge.  The patient does not have transportation limitations that hinder transportation to clinic appointments.  .Services Needed at time of discharge: Y = Yes, Blank = No PT:   OT:   RN:   Equipment:   Other:     LOS: 23 days   Blain Pais, MD 05/14/2013, 9:08 AM

## 2013-05-14 NOTE — Progress Notes (Signed)
[place] [phone]   [phone]  CONSULT NOTE  REFERRING PHYSICIAN: Dr. Elveria Royals  REASON FOR CONSULTATION:  63 years old African American male with questionable metastatic carcinoma  HPI Joshua [name] is a 63 y.o. male with a past medical history significant for multiple medical problems including history of end-stage renal disease status post renal transplant and currently on hemodialysis, , history of prostate cancer status post radiotherapy followed by urology with recurrence in January of 2015, history of stroke, coronary artery disease status post bypass surgery, peripheral vascular disease, aortic stenosis as well as congestive heart failure. The patient was admitted to [facility] on 04/24/2013 with shortness of breath and anasarca. His condition has been deteriorating recently and the patient is poorly responsive over the last few days. CT of the abdomen on 05/09/2013 showed multifocal hepatic lesions measuring up to 3.6 CM, new from 02/28/2013 suspicious for metastasis or possibly multifocal abscesses. There was also moderate to large volume abdominopelvic ascites. There was also moderate bilateral pleural effusions. The patient is currently seen by the palliative care team. Oncology service was consulted to evaluate the patient and give closure to his family regarding his questionable metastatic disease. When seen today the patient is poorly responsive. Several family members whether at the bedside including his daughter, his uncle, his brother and his sister-in-law. I also spoke to his wife after the visit by telephone. He continues to have swelling of the lower extremities as well as pain diffusely in the abdomen. There is no significant fever or chills.  HPI  Past Medical History  Diagnosis Date  . Carotid artery occlusion   . ASCVD (arteriosclerotic cardiovascular disease)   . CHF (congestive heart failure)   .  Anginal pain     last chest pain was a couple of months ago-just lasted less than a minute  . Stroke     "light stroke" about time of MI  . Renal failure     s/p renal transplant  . Aortic stenosis     moderate AS by 06/2011 echo (Dr. Fransico Him)  . Coronary artery disease 03/2009    2 vessel s/p BMS to LAD in setting of NSTEMI, RCA chronically occluded  . PVD (peripheral vascular disease)     s/p fempop 1980's  . Hypothyroidism     s/p papillary thyroid CA excision  . Hypertension   . Hyperlipidemia   . Diastolic dysfunction   . Type II diabetes mellitus   . Prostate cancer     "radiation tx ~ 4-5 yr ago; cleared then came back a couple months ago" (03/20/2013)  . Plavix resistance     a. P2y12 365 in 03/2013.  . End stage renal disease- s/p transplant 04/03/2013    Patient states he started dialysis sometime around 1981-1982.  He then received a kidney transplant in 2002 at [facility].  The transplant kidney is still functioning, he sees Dr Florene Glen at [facility] about every 6 months as of 2015.      Past Surgical History  Procedure Laterality Date  . Nephrectomy transplanted organ Bilateral 2001  . Pr vein bypass graft,aorto-fem-pop  1980    fem pop   . Endarterectomy  08/25/2011    Procedure: ENDARTERECTOMY CAROTID;  Surgeon: Angelia Mould, MD;  Location: [place];  Service: Vascular;  Laterality: Right;  . Thyroidectomy    . Coronary angioplasty with stent placement  03/2009    "1"  . Colonoscopy N/A 03/31/2013  Procedure: COLONOSCOPY;  Surgeon: Lafayette Dragon, MD;  Location: [facility] ENDOSCOPY;  Service: Endoscopy;  Laterality: N/A;  . Esophagogastroduodenoscopy N/A 03/31/2013    Procedure: ESOPHAGOGASTRODUODENOSCOPY (EGD);  Surgeon: Lafayette Dragon, MD;  Location[facility]COPY;  Service: Endoscopy;  Laterality: N/A;    Family History  Problem Relation Age of Onset  . Diabetes Mother   . Hyperlipidemia Mother   . Hypertension Mother   . Heart disease Mother     before age 48  .  Hyperlipidemia Father   . Hypertension Father   . Heart disease Father   . Hyperlipidemia Sister   . Hypertension Sister   . Diabetes Brother   . Hypertension Brother   . Hyperlipidemia Brother   . Seizures Brother     Social History History  Substance Use Topics  . Smoking status: Never Smoker   . Smokeless tobacco: Never Used  . Alcohol Use: No    Allergies  Allergen Reactions  . Fentanyl     "Almost died"   . Propofol     "Almost died"     Current Facility-Administered Medications  Medication Dose Route Frequency Provider Last Rate Last Dose  . 0.9 %  sodium chloride infusion   Intravenous Continuous Madilyn Fireman, MD      . albuterol (PROVENTIL) (2.5 MG/3ML) 0.083% nebulizer solution 2.5 mg  2.5 mg Nebulization Q6H PRN Karren Cobble, MD      . ALPRAZolam Duanne Moron) tablet 0.25 mg  0.25 mg Oral QHS PRN Pershing Proud, NP   0.25 mg at 05/13/13 2234  . amiodarone (PACERONE) tablet 200 mg  200 mg Oral Daily Blain Pais, MD   200 mg at 05/14/13 1139  . antiseptic oral rinse (BIOTENE) solution 15 mL  15 mL Mouth Rinse BID Duwaine Maxin, DO   15 mL at 05/14/13 1116  . aspirin EC tablet 81 mg  81 mg Oral Daily Duwaine Maxin, DO   81 mg at 05/14/13 1116  . atorvastatin (LIPITOR) tablet 80 mg  80 mg Oral q1800 Otho Bellows, MD   80 mg at 05/13/13 1709  . calamine lotion   Topical PRN Lesly Dukes, MD      . calcium acetate (PHOSLO) capsule 2,001 mg  2,001 mg Oral TID WC Donetta Potts, MD   2,001 mg at 05/14/13 1117  . calcium carbonate (TUMS - dosed in mg elemental calcium) chewable tablet 800 mg of elemental calcium  800 mg of elemental calcium Oral QHS Donetta Potts, MD   800 mg of elemental calcium at 05/13/13 2234  . collagenase (SANTYL) ointment   Topical Daily Madilyn Fireman, MD      . diphenhydrAMINE (BENADRYL) capsule 25 mg  25 mg Oral Q6H PRN Otho Bellows, MD   25 mg at 04/29/13 2359  . feeding supplement (GLUCERNA SHAKE) (GLUCERNA SHAKE) liquid  237 mL  237 mL Oral BID BM Erlene Quan, RD   237 mL at 05/14/13 1140  . insulin aspart (novoLOG) injection 0-9 Units  0-9 Units Subcutaneous TID WC Madilyn Fireman, MD   3 Units at 05/14/13 0714  . insulin glargine (LANTUS) injection 10 Units  10 Units Subcutaneous QHS Lesly Dukes, MD   10 Units at 05/13/13 2234  . levothyroxine (SYNTHROID, LEVOTHROID) tablet 150 mcg  150 mcg Oral QAC breakfast Otho Bellows, MD   150 mcg at 05/14/13 1119  . lidocaine (LIDODERM) 5 % 1 patch  1 patch Transdermal QAC breakfast Pershing Proud,  NP   1 patch at 05/14/13 1115  . lip balm (BLISTEX) ointment   Topical PRN Karren Cobble, MD      . metoprolol (LOPRESSOR) tablet 150 mg  150 mg Oral BID Lesly Dukes, MD   150 mg at 05/14/13 1120  . multivitamin with minerals tablet 1 tablet  1 tablet Oral Daily Baird Lyons, RD   1 tablet at 05/14/13 1130  . mycophenolate (MYFORTIC) EC tablet 720 mg  720 mg Oral BID Karren Cobble, MD   720 mg at 05/14/13 1130  . oxyCODONE (Oxy IR/ROXICODONE) immediate release tablet 10 mg  10 mg Oral Q4H PRN Karren Cobble, MD   10 mg at 05/13/13 2234  . pantoprazole (PROTONIX) EC tablet 40 mg  40 mg Oral Daily Otho Bellows, MD   40 mg at 05/14/13 1140  . senna-docusate (Senokot-S) tablet 2 tablet  2 tablet Oral QHS PRN Pershing Proud, NP      . sirolimus (RAPAMUNE) tablet 1 mg  1 mg Oral q morning - 10a Donetta Potts, MD   1 mg at 05/14/13 1131    Review of Systems  Constitutional: positive for anorexia, fatigue and weight loss Eyes: negative Ears, nose, mouth, throat, and face: negative Respiratory: positive for dyspnea on exertion Cardiovascular: negative Gastrointestinal: negative Genitourinary:positive for decreased stream Integument/breast: negative Hematologic/lymphatic: negative Musculoskeletal:positive for back pain Neurological: negative Behavioral/Psych: negative Endocrine: negative Allergic/Immunologic: negative  Physical Exam  RAL:ill  looking, malnourished and uncooperative SKIN: skin color, texture, turgor are normal HEAD: Normocephalic, No masses, lesions, tenderness or abnormalities EYES: normal, PERRLA EARS: External ears normal, Canals clear OROPHARYNX:no exudate and no erythema  NECK: supple, no adenopathy LYMPH:  no palpable lymphadenopathy LUNGS: scattered rales bilaterally HEART: regular rate & rhythm and no murmurs ABDOMEN:abdomen soft, non-tender, normal bowel sounds and no masses or organomegaly BACK: Back symmetric, no curvature. EXTREMITIES:no joint deformities, effusion, or inflammation  NEURO: alert & oriented x 3 with fluent speech, no focal motor/sensory deficits  PERFORMANCE STATUS: ECOG 3  LABORATORY DATA: Lab Results  Component Value Date   WBC 6.7 05/14/2013   HGB 8.8* 05/14/2013   HCT 28.5* 05/14/2013   MCV 81.9 05/14/2013   PLT 48* 05/14/2013    @LASTCHEM @  RADIOGRAPHIC STUDIES: Ct Abdomen Pelvis Wo Contrast  05/09/2013   CLINICAL DATA:  Abdominal distention, prostate cancer, kidney transplant. Ascites.  EXAM: CT ABDOMEN AND PELVIS WITHOUT CONTRAST  TECHNIQUE: Multidetector CT imaging of the abdomen and pelvis was performed following the standard protocol without intravenous contrast.  COMPARISON:  02/28/2013  FINDINGS: Motion degraded images.  Cardiomegaly.  Moderate bilateral pleural effusions. Associated lower lobe atelectasis.  Unenhanced liver is poorly evaluated but notable for at least 3 dominant lesions in the right hepatic lobe, new, as follows:  --3.6 x 3.3 cm hypodense lesion in the posterior segment right hepatic lobe (series 2/ image 30)  --2.1 x 2.3 cm hypodense lesion in the posterior segment right hepatic lobe (series 2/ image 34)  --3.0 x 2.8 cm hypodense lesion in the inferior right hepatic lobe (series 2/image 38)  Additional ill-defined lesions are expected throughout the remaining liver. None of these lesions are visualized even in retrospect on prior CT. Primary differential  considerations include metastases (from a nonvisualized primary) or possibly multifocal abscesses.  Unenhanced spleen is mildly heterogeneous with to possible hypodense posterior splenic lesions (series 2/ images 21 and 23), equivocal.  Pancreas and adrenal glands are within normal limits.  Gallbladder is distended  with a tiny layering gallstone (series 2/ image 35). No associated inflammatory changes by CT. No intrahepatic or extrahepatic ductal dilatation.  Bilateral negative renal atrophy with cortical calcifications. 1.5 cm lesion in the posterior right upper pole (series 2/ image 31) and 1.8 cm lesion in the posterior left lower pole (series 2/ image 40), which does not measure simple fluid density and cannot be characterized as simple on unenhanced CT. Right lower quadrant renal transplant without hydronephrosis.  No evidence of bowel obstruction. Apparent wall thickening along the ascending colon (coronal image 45), favored to reflect stool/debris given essentially normal appearance on prior CT. Normal appendix.  Atherosclerotic calcifications of the abdominal aorta and branch vessels.  Moderate to large volume abdominopelvic ascites, new from prior CT.  No suspicious abdominopelvic lymphadenopathy.  Brachytherapy seeds in the prostate.  Bladder is unremarkable.  Fluid within a small to moderate fat containing right mid abdominal ventral hernia (series 2/image 55).  Surgical clips in the bilateral inguinal regions.  Degenerative changes of the visualized thoracolumbar spine. Interval vertebral augmentation at L1 (sagittal image 57).  IMPRESSION: Multifocal hepatic lesions, measuring up to 3.6 cm, new from 02/28/2013. While poorly evaluated on unenhanced CT, this appearance is suspicious for metastases (from a nonvisualized primary) or possibly multifocal abscesses.  Moderate to large volume abdominopelvic ascites.  Apparent wall thickening along the ascending colon, although favored to reflect stool/debris  given essentially normal appearance on prior CT.  Cardiomegaly with moderate bilateral pleural effusions.  Brachytherapy seeds in the prostate.  Additional stable ancillary findings as above.   Electronically Signed   By: Julian Hy M.D.   On: 05/09/2013 16:19   Dg Chest 1 View  04/29/2013   CLINICAL DATA:  Thoracentesis  EXAM: CHEST - 1 VIEW  COMPARISON:  DG CHEST 2 VIEW dated 04/27/2013  FINDINGS: The heart remains moderately enlarged. Left pleural effusion resolve after the thoracentesis. No ensuing pneumothorax. Overall, vascular congestion and edema have also improved. Right pleural effusion has probably also improved.  IMPRESSION: No pneumothorax post left thoracentesis.   Electronically Signed   By: Maryclare Bean M.D.   On: 04/29/2013 11:27   Dg Chest 2 View  04/27/2013   CLINICAL DATA:  Shortness of breath, dyspnea, weakness  EXAM: CHEST  2 VIEW  COMPARISON:  DG CHEST 1V PORT dated 04/27/2013; DG CHEST 2 VIEW dated 04/24/2013; DG CHEST 2 VIEW dated 04/22/2013  FINDINGS: Grossly unchanged enlarged cardiac silhouette and mediastinal contours. The pulmonary vasculature is less distinct on the present examination with cephalization of flow. Worsening perihilar and bilateral medial basilar heterogeneous/consolidative opacities, left greater than right. Interval development of small bilateral pleural effusions. No pneumothorax. Grossly unchanged bones. Multiple surgical clips overlie the midline of the lower neck. A vascular stent overlies the inner aspect of the left upper arm. Surgical clips overlie the bilateral axilla.  IMPRESSION: Findings most suggestive of worsening pulmonary edema, small bilateral effusions and perihilar/medial basilar opacities, left greater than right, atelectasis versus infiltrate.   Electronically Signed   By: Sandi Mariscal M.D.   On: 04/27/2013 11:29   Dg Chest 2 View  04/24/2013   CLINICAL DATA:  Dyspnea  EXAM: CHEST  2 VIEW  COMPARISON:  Prior radiograph from 04/22/2013   FINDINGS: Cardiomegaly is stable as compared to the prior exam.  Lungs are mildly hypoinflated. There is diffuse pulmonary vascular congestion with indistinctness of the interstitial markings, compatible with pulmonary edema, worsened. Small bilateral pleural effusions are present. More confluent bilateral perihilar opacities likely reflect  alveolar edema. No definite focal infiltrates identified. No pneumothorax.  Surgical clips overlie the left axilla and lower neck. No acute osseous abnormality.  IMPRESSION: Cardiomegaly with small bilateral pleural effusions and moderate diffuse pulmonary edema, worsened as compared to 04/22/13.   Electronically Signed   By: Jeannine Boga M.D.   On: 04/24/2013 03:12   Dg Chest 2 View  04/22/2013   CLINICAL DATA:  Shortness of breath and weakness  EXAM: CHEST  2 VIEW  COMPARISON:  04/21/2013  FINDINGS: Clips project over the neck from presumed thyroidectomy. Interstitial edema, trace effusions, and curvilinear bilateral lower lobe presumed atelectasis are again noted. There has been interval increase in hazy predominantly dependent bilateral lower lobe airspace opacity. Cardiomegaly persists. Left axillary clips are noted.  IMPRESSION: Slight increase in bibasilar opacities likely progressive alveolar superimposed on interstitial pulmonary edema.   Electronically Signed   By: Conchita Paris M.D.   On: 04/22/2013 16:32   Dg Chest 2 View  04/21/2013   CLINICAL DATA:  Leg swelling, history CHF, hypertension, diabetes, stroke  EXAM: CHEST  2 VIEW  COMPARISON:  04/19/2013  FINDINGS: Enlargement of cardiac silhouette with pulmonary vascular congestion.  Diffuse pulmonary infiltrates compatible pulmonary edema and CHF.  Scattered areas of bibasilar atelectasis.  More focal opacity or left lung base may represent atelectasis or increased edema but coexistent infection not excluded.  Small bibasilar effusions.  No pneumothorax.  Vascular stents in left upper arm.  Retained  contrast in colon.  IMPRESSION: Diffuse pulmonary infiltrates likely representing pulmonary edema/ CHF with scattered areas of atelectasis and small bilateral pleural effusions as above.   Electronically Signed   By: Lavonia Dana M.D.   On: 04/21/2013 14:40   Dg Chest 2 View  04/19/2013   CLINICAL DATA:  Atrophic relation and tachycardia  FINDINGS: There is pulmonary edema. There are bilateral pleural effusions. There is consolidation of bilateral lung bases. The heart size is enlarged. The aorta is tortuous. Surgical clips are identified in the lower neck and upper mediastinum an bilateral axilla.  IMPRESSION: Congestive heart failure. Small bilateral pleural effusions. Patchy consolidation of both lung bases, superimposed pneumonia not excluded.   Electronically Signed   By: Abelardo Diesel M.D.   On: 04/19/2013 01:57   Dg Chest Port 1 View  04/27/2013   CLINICAL DATA:  Increasing shortness of breath question fluid overload  EXAM: PORTABLE CHEST - 1 VIEW  COMPARISON:  Portable exam 3244 hr compared to 04/24/2013  FINDINGS: Enlargement of cardiac silhouette with pulmonary vascular congestion.  Diffuse interstitial infiltrates consistent with pulmonary edema.  Additional atelectasis versus consolidation left lower lobe.  No pleural effusion or pneumothorax.  IMPRESSION: CHF with atelectasis versus consolidation in left lower lobe.   Electronically Signed   By: Lavonia Dana M.D.   On: 04/27/2013 09:14   Dg Fluoro Guide Cv Line-no Report  04/28/2013   CLINICAL DATA: Gordy Councilman placement   FLOURO GUIDE CV LINE  Fluoroscopy was utilized by the requesting physician.  No radiographic  interpretation.    Ir Kypho Vertebral Thoracic Augmentation  04/26/2013   CLINICAL DATA:  Prostate carcinoma. Multiple axial lesions on thoracic and lumbar MRI. Painful compression fracture deformity of T12.  EXAM: 1.  DEEP BONE BIOPSY THORACIC T12 UNDER FLUOROSCOPY  2.  KYPHOPLASTY AT THORACIC T12  TECHNIQUE: The procedure, risks  (including but not limited to bleeding, infection, organ damage), benefits, and alternatives were explained to the patient. Questions regarding the procedure were encouraged and answered. The  patient understands and consents to the procedure.  The patient was placed prone on the fluoroscopic table. The skin overlying the lower thoracic region was then prepped and draped in the usual sterile fashion. Maximal barrier sterile technique was utilized including caps, mask, sterile gowns, sterile gloves, sterile drape, hand hygiene and skin antiseptic.  Intravenous Fentanyl and Versed were administered as conscious sedation during continuous cardiorespiratory monitoring by the radiology RN, with a total moderate sedation time of40 minutes.  As antibiotic prophylaxis, !cefazolin 1 gram! was ordered pre-procedure and administered intravenously within !one hour! of incision.  The thoracic compression deformity of T12 was localized.The right pedicle at T12 was then infiltrated with 1% lidocaine followed by the advancement of a Kyphon trocar needle through the right pedicle into the posterior one-third. The trocar was removed and a coaxial core bone biopsy was obtained. Subsequently, The osteo drill was advanced to the anterior third of the vertebral body. The osteo drill was retracted. Through the working cannula, a Kyphon inflatable bone tamp 15 x 3 was advanced and positioned with the distal marker 5 mm from the anterior aspect of the cortex. Crossing of the midline was not seen on the AP projection. At this time, the balloon was expanded using contrast via a Kyphon inflation syringe device via micro tubing.  In similar fashion, the left pedicle was infiltrated with 1% lidocaine followed by the advancement of a second Kyphon trocar needle through the left pedicle into the posterior third of the vertebral body. Coaxial core deep bone biopsy sample obtained. The osteo drill was coaxially advanced to the anterior right third.  The osteo drill was exchanged for a Kyphon inflatable bone tamp 15x 3, advanced to the 5 mm of the anterior aspect of the cortex. The balloon was then expanded using contrast as above.  Inflations were continued until there was near apposition across the midline and approaching the superior and the inferior end plates.  At this time, methylmethacrylate mixture was reconstituted in the Kyphon bone mixing device system. This was then loaded into the delivery mechanism, attached to Kyphon bone fillers.  The balloons were deflated and removed followed by the instillation of methylmethacrylate mixture with excellent filling in the AP and lateral projections. No extravasation was noted in the disk spaces or posteriorly into the spinal canal. No epidural venous contamination was seen.  The patient tolerated the procedure well. There were no acute complications. The working cannulae and the bone filler were then retrieved and removed.  IMPRESSION: 1. Deep bone core biopsy of thoracic T12 lesion. 2. Status post vertebral body augmentation using balloon kyphoplasty at thoracic T12 as described without event.   Electronically Signed   By: Arne Cleveland M.D.   On: 04/26/2013 18:14   US Thoracentesis Asp Pleural Space W/img Guide  04/29/2013   CLINICAL DATA:  Patient with history of dyspnea, CHF, bilateral pleural effusions, coronary artery disease, chronic kidney disease, prostate cancer ; request is made for left thoracentesis.  EXAM: ULTRASOUND GUIDED DIAGNOSTIC AND THERAPEUTIC LEFT THORACENTESIS  COMPARISON:  None.  FINDINGS: A total of approximately 780 cc's of yellow fluid was removed. The fluid sample was sent for laboratory analysis.  IMPRESSION: Successful ultrasound guided diagnostic/therapeutic left thoracentesis yielding 780 cc's of pleural fluid.  Read by: Rowe Robert ,P.A.-C.  PROCEDURE: An ultrasound guided thoracentesis was thoroughly discussed with the patient and questions answered. The benefits, risks,  alternatives and complications were also discussed. The patient understands and wishes to proceed with the procedure. Written consent  was obtained.  Ultrasound was performed to localize and mark an adequate pocket of fluid in the left chest. The area was then prepped and draped in the normal sterile fashion. 1% Lidocaine was used for local anesthesia. Under ultrasound guidance a 19 gauge Yueh catheter was introduced. Thoracentesis was performed. The catheter was removed and a dressing applied.  Complications:  none   Electronically Signed   By: Maryclare Bean M.D.   On: 04/29/2013 11:11    ASSESSMENT: This is a very pleasant 63 years old [place] American male with history of prostate adenocarcinoma with questionable disease recurrence involving the T12 spine as well as the liver. His last MRI of the lumbar spine on 02/16/2013 showed findings consistent with metastatic prostate cancer most significantly affecting the T12 vertebral body. No pathologic fracture or canal tumor. The lesions in the liver most likely related to his history of prostatic adenocarcinoma but other etiology cannot be ruled out at this point.   PLAN: I have a lengthy discussion with the patient and his family today about his current condition and prognosis. The patient has very poor performance status with very complicated medical history and I don't think he is a candidate for any aggressive treatment at this point. He is poorly responsive.  I strongly recommended for the family to consider palliative care and hospice referral at this point. The family are considering transferring the patient to [facility] place for end-of-life care. I don't see a need to do a liver biopsy or continuing on dialysis at this point. The patient voices understanding of current disease status and treatment options and is in agreement with the current care plan.  All questions were answered. The patient knows to call the clinic with any problems, questions or  concerns. We can certainly see the patient much sooner if necessary.  Thank you so much for allowing me to participate in the care of Daniela Ambrosius. I will continue to follow up the patient with you and assist in his care.   Disclaimer: This note was dictated with voice recognition software. Similar sounding words can inadvertently be transcribed and may not be corrected upon review.   Kathryne Ramella K. 05/14/2013, 12:25 PM

## 2013-05-14 NOTE — Progress Notes (Signed)
Clinical Social Work Department BRIEF PSYCHOSOCIAL ASSESSMENT 05/14/2013  Patient:  Joshua [name],Joshua [name]     Account Number:  [id number]     Admit date:  04/21/2013  Clinical Social Worker:  Rolinda Roan  Date/Time:  05/14/2013 05:02 PM  Referred by:  Physician  Date Referred:  05/14/2013 Referred for  Residential hospice placement   Other Referral:   Interview type:  Family Other interview type:    PSYCHOSOCIAL DATA Living Status:  WIFE Admitted from facility:   Level of care:   Primary support name:  Joshua [name] [phone] Primary support relationship to patient:  SPOUSE Degree of support available:   Very supportive at bedside.    CURRENT CONCERNS  Other Concerns:    SOCIAL WORK ASSESSMENT / PLAN Clinical Education officer, museum (CSW) received call from MD and RN case manager for residential hospice placement. CSW met with patient's wife Paulette at bedside and two daughters. Wife reported that [facility] is her first choice. CSW explained to wife and daughters that [facility] is usually full and encouraged them to pick a back up. Wife and daughters were agreeable to send referral to [facility] as well.    CSW faxed referral to [place] and [facility]. Per Salomon Fick liaison at [facility] they do not have any beds today. Per Harmon Pier liaison at [facility] they do not have any beds today and she will follow up with family on Monday.   Assessment/plan status:  Psychosocial Support/Ongoing Assessment of Needs Other assessment/ plan:   Information/referral to community resources:   CSW gave wife list of residential hospice facilities.    PATIENT'S/FAMILY'S RESPONSE TO PLAN OF CARE: Wife and daughters thanked CSW for visit and sending referral. CSW provided emotional support to family.

## 2013-05-14 NOTE — Progress Notes (Signed)
   CARE MANAGEMENT NOTE 05/14/2013  Patient:  Joshua [name],Joshua [name]   Account Number:  [id number]  Date Initiated:  04/25/2013  Documentation initiated by:  HUTCHINSON,CRYSTAL  Subjective/Objective Assessment:   admitted with ESRD - s/p   transplant swelling     Action/Plan:   will follow for disposition needs   Anticipated DC Date:  05/10/2013   Anticipated DC Plan:  LONG TERM ACUTE CARE (LTAC)  In-house referral  Clinical Social Worker      DC Planning Services  CM consult      Choice offered to / List presented to:  NA           Status of service:  Completed, signed off Medicare Important Message given?   (If response is "NO", the following Medicare IM given date fields will be blank) Date Medicare IM given:   Date Additional Medicare IM given:    Discharge Disposition:  [place]  Per UR Regulation:  Reviewed for med. necessity/level of care/duration of stay  If discussed at [place] of Stay Meetings, dates discussed:   05/04/2013    Comments:  05/14/13 16:10 CM received call from RN requesting list of Home hospices for pt.  CM met with Wife of pt, Payden, Docter [phone] or [phone] to offer choice and family stated they were hoping for [facility]. CM explained CSW arranges Residential Hospice and I would call CSW for them.  CSW, Mel Almond was called and had already called [facility] and was on her way to room to discuss with pt's family.  No other CM needs were communicated. Mariane Masters, BSN, [place].  05/05/13 JULIE AMERSON,RN,BSN [id number] NO BED AVAILABLE AT [facility] TODAY. MET WITH PT AND WIFE TO READRESS [facility] AS OPTION.  THEY ARE NOT WILLING TO CONSIDER [facility] AS DISPOSITION.  [facility] BED AVAILABLE OVER THE WEEKEND; IF SO, PT CAN TRANSFER.  DISCUSSED WITH ATTENDING MDS.  WILL UPDATE WEEKEND CASE MANAGER WITH THIS INFORMATION.  05/04/13 JULIE AMERSON,RN,BSN [id number] PT AGREEABLE TO LTAC, AND  REQUESTS [facility].  BED NOT AVAILABLE TODAY, PER LIASION, BUT LIKELY TOMORROW. WILL FOLLOW UP WITH LTAC [facility] IN AM TO DISCUSS BED AVAILABILITY.  UPDATED ATTENDING MD.  05-01-2013 11:50am Luz Lex, RNBSN [phone] Discussed ltach option with rounding physician group. Agree that this may be a good option.  Ltach order placed to check benefits and to see if meets criteria.  CM will continue to follow.  04/28/2013 Hx/o ESRD sp transplant 13 years ago, Pulmonary Edema, volume excess Hx/o readmission x 2 > 6 months Social:  From home with PCG/Wife Home DME:  [facility], home oxygen Hx/o readmission x 2 > 6 months Active with HHS:  [facility] for RN, PT, OT services. (To be resumed at d/c) - However, PT RECS:  SNF Chest pain episode 04/27/2013:  Transferred to 2 H on 04/27/2013 Disposition Plan:  SNF  (SW referral sent to [facility] covering for [place] on 04/27/2013 Crystal Hutchinson RN, BSN, MSHL, CCM 04/28/2013   Social:  From home with PCG/Wife Home DME:  [facility] Rile, home oxygen Hx/o readmission x 2 > 6 months IV Lasix. Active with HHS:  [facility] for RN, PT, OT services. (To be resumed at d/c) ADD:  +2-3 Mariann Laster RN, BSN, [place], CCM 04/25/2013

## 2013-05-14 NOTE — Progress Notes (Signed)
Night [place] Progress Note  Paged by nurse that family c/o patient with decreased po and not able to swallow evening meds.  Per nurse and chart review, patient and family met with Oncology today and decided on Hospice Care.  I went to see Mr. Smail and family members.  His wife confirms that they are pursuing placement at residential hospice.   He is now mostly non-verbal.  He is able to squeeze my hand and appears comfortable.  I spoke with the family about their understanding of comfort care and I explained some of the expectations at end of life, such as difficulty swallowing, increased secretions and decreased po.  I told them that he will be getting fewer medications as we transition to hospice care, where the focus will be on keeping him comfortable as opposed to treating disease.    Vitals reviewed. General:  Eyes wide open, staring ahead but makes eye contact when spoken to; mouth partly open. Cardiac:  RRR Lungs: Clear interiorly, regular respirations Abdomen:  Distended, non-tender Neuro:  Verbal responses infrequent, squeezes hand at times  Mr. Hinch is approaching end of life and experiencing physiologic changes that are expected but can be difficult for family.  His family expresses their desire for him to be comfortable.  When I asked Mr. Mcnealy about his desires, he squeezes my hand when talking about comfort but no response when asked about feeding tubes.  Based on discussion with family and patient's decision today for hospice, I will not make any feeding intervention.  The family is in agreement to continue current plan of care. - patient can eat and drink when and if he desires - medications as tolerated for now, will defer to primary team to make further changes - continue oral care - plan to d/c to residential hospice when room available - will inform primary team of discussion and encourage further discussion with the family in the morning

## 2013-05-15 ENCOUNTER — Encounter: Payer: Medicare Other | Admitting: Internal Medicine

## 2013-05-15 DIAGNOSIS — Z515 Encounter for palliative care: Secondary | ICD-10-CM

## 2013-05-15 DIAGNOSIS — E876 Hypokalemia: Secondary | ICD-10-CM

## 2013-05-15 DIAGNOSIS — Z66 Do not resuscitate: Secondary | ICD-10-CM | POA: Diagnosis not present

## 2013-05-15 LAB — GLUCOSE, CAPILLARY
Glucose-Capillary: 111 mg/dL — ABNORMAL HIGH (ref 70–99)
Glucose-Capillary: 98 mg/dL (ref 70–99)
Glucose-Capillary: 113 mg/dL — ABNORMAL HIGH (ref 70–99)

## 2013-05-15 MED ORDER — COLLAGENASE 250 UNIT/GM EX OINT: TOPICAL_OINTMENT | Freq: Every day | CUTANEOUS | Status: AC

## 2013-05-15 MED ORDER — HYDROMORPHONE HCL PF 1 MG/ML IJ SOLN: 0.5000 mg | INTRAMUSCULAR | Status: AC | PRN

## 2013-05-15 MED ORDER — ALBUTEROL SULFATE (2.5 MG/3ML) 0.083% IN NEBU: 2.5000 mg | INHALATION_SOLUTION | Freq: Four times a day (QID) | RESPIRATORY_TRACT | Status: AC | PRN

## 2013-05-15 MED ORDER — LORAZEPAM 2 MG/ML IJ SOLN: 1.0000 mg | INTRAMUSCULAR | Status: DC | PRN

## 2013-05-15 MED ORDER — LORAZEPAM 2 MG/ML IJ SOLN: 1.0000 mg | INTRAMUSCULAR | Status: AC | PRN

## 2013-05-15 MED ORDER — BIOTENE DRY MOUTH MT LIQD: 15.0000 mL | Freq: Two times a day (BID) | OROMUCOSAL | Status: AC

## 2013-05-15 MED ORDER — DIPHENHYDRAMINE HCL 50 MG/ML IJ SOLN: 12.5000 mg | Freq: Four times a day (QID) | INTRAMUSCULAR | Status: DC | PRN

## 2013-05-15 MED ORDER — HYDROMORPHONE HCL PF 1 MG/ML IJ SOLN: 0.5000 mg | INTRAMUSCULAR | Status: DC | PRN

## 2013-05-15 MED ORDER — BISACODYL 10 MG RE SUPP: 10.0000 mg | Freq: Every day | RECTAL | Status: DC | PRN

## 2013-05-15 MED ORDER — BLISTEX MEDICATED EX OINT: 1.0000 "application " | TOPICAL_OINTMENT | CUTANEOUS | Status: AC | PRN

## 2013-05-15 NOTE — Progress Notes (Signed)
  Date: 05/15/2013  Patient name: Joshua [name]  Medical record number: [id number]  Date of birth: 12-28-1950   This patient has been seen and the plan of care was discussed with the house staff. Please see their note for complete details. I concur with their findings with the following additions/corrections: Mostly non-verbal this morning. He opens his eyes. He has stopped taking in PO. Wife is interested in hospice care. We will make sure he is comfortable.  Dominic Pea, DO, [facility] 05/15/2013, 1:38 PM

## 2013-05-15 NOTE — Progress Notes (Addendum)
Update: CSW received phone call from [facility], who spoke with patient's family and they would like to pursue [facility] today. CSW paged MD to inform.  CSW received phone call that [facility] does have availability today and is going to follow up with patient's wife. Per patient's wife, her first choice is [facility], which does not have ability, her second choice is [facility] [place], which [facility]. CSW following up with Hospice facilities.  Jeanette Caprice, MSW, [place]

## 2013-05-15 NOTE — Progress Notes (Signed)
Subjective: Patient seen and examined at the bedside this morning. His wife and grandson were at the bedside. [name] has declined over the weekend. He is mostly non-verbal. He does respond somewhat to his wife, saying "I'm okay" when she calls his name. He is resting in bed in NAD. He is not eating or drinking any more.  Palliative Care NP, Joshua [name], also present to talk to the wife. Wife is tearful. Wife would like to focus on comfort, pain control, and go to a hospice, hopefully [facility] so family in [place] can visit. We talked briefly about feeding. She admits her husband wound not want a feeding tube. She will continue to use mouth swabs. She is OK with stopping the telemetry for now and having the nursing staff monitor his vital signs and comfort level.  Objective: Vital signs in last 24 hours: Filed Vitals:   05/14/13 1447 05/14/13 2153 05/14/13 2300 05/15/13 0500  BP: 85/54 97/66  120/74  Pulse: 100 110  112  Temp: 97.9 F (36.6 C) 97.3 F (36.3 C)  97.6 F (36.4 C)  TempSrc: Oral Axillary  Oral  Resp: 18 19  18   Height:      Weight:   173 lb 1.6 oz (78.518 kg) 171 lb 15.3 oz (78 kg)  SpO2: 94% 95%  97%   Weight change: 8 lb 3.1 oz (3.718 kg)  Intake/Output Summary (Last 24 hours) at 05/15/13 0719 Last data filed at 05/14/13 1120  Gross per 24 hour  Intake    230 ml  Output      0 ml  Net    230 ml  Vitals reviewed  Physical exam General: Lying in bed in NAD, occasional twitching of his bilateral shoulder/arm, cachectic, nonverbal but nods "I'm okay" when wife asks if he has pain Eyes: Jaundiced  Cardiac:  Tachycardia, regular and rhythm, no rubs, murmur or gallops  Pulm: CTAB, good air movement, no respiratory distress Abd: continued significant abdominal distention, but soft with no tenderness to palpation, no guarding, BS present  Ext: warm and well perfused, no lower extremity edema  Neuro: alert, oriented to place and person, slow to follow  commands, moves all 4 extremities voluntairly   Lab Results: Basic Metabolic Panel:  Recent Labs Lab 05/13/13 0404 05/14/13 0425  NA 137 141  K 5.0 5.1  CL 90* 92*  CO2 22 23  GLUCOSE 154* 210*  BUN 40* 60*  CREATININE 2.95* 3.68*  CALCIUM 8.8 8.9   Liver Function Tests:  Recent Labs Lab 05/13/13 0404 05/14/13 0425  AST 218* 398*  ALT 62* 103*  ALKPHOS 744* 864*  BILITOT 0.6 0.5  PROT 6.5 6.0  ALBUMIN 1.7* 1.5*    Recent Labs Lab 05/11/13 0930 05/12/13 1528  AMMONIA 30 64*   CBC:  Recent Labs Lab 05/13/13 0404 05/14/13 0425  WBC 8.4 6.7  HGB 9.6* 8.8*  HCT 30.4* 28.5*  MCV 82.6 81.9  PLT 58* 48*   Cardiac Enzymes:  Recent Labs Lab 05/10/13 0332 05/10/13 1015 05/10/13 1600  TROPONINI <0.30 <0.30 <0.30   CBG:  Recent Labs Lab 05/13/13 2124 05/14/13 0704 05/14/13 1114 05/14/13 1608 05/14/13 2159 05/15/13 0607  GLUCAP 201* 229* 162* 156* 111* 98   Coagulation:  Recent Labs Lab 05/14/13 0425  LABPROT 19.0*  INR 1.64*   Medications: I have reviewed the patient's current medications. Scheduled Meds: . amiodarone  200 mg Oral Daily  . antiseptic oral rinse  15 mL Mouth Rinse BID  .  aspirin EC  81 mg Oral Daily  . atorvastatin  80 mg Oral q1800  . calcium acetate  2,001 mg Oral TID WC  . calcium carbonate  800 mg of elemental calcium Oral QHS  . collagenase   Topical Daily  . feeding supplement (GLUCERNA SHAKE)  237 mL Oral BID BM  . insulin aspart  0-9 Units Subcutaneous TID WC  . insulin glargine  10 Units Subcutaneous QHS  . levothyroxine  150 mcg Oral QAC breakfast  . lidocaine  1 patch Transdermal QAC breakfast  . metoprolol  150 mg Oral BID  . multivitamin with minerals  1 tablet Oral Daily  . mycophenolate  720 mg Oral BID  . pantoprazole  40 mg Oral Daily  . sirolimus  1 mg Oral q morning - 10a   Continuous Infusions: . sodium chloride Stopped (05/08/13 0831)   PRN Meds:.albuterol, ALPRAZolam, calamine,  diphenhydrAMINE, lip balm, oxyCODONE, senna-docusate  Assessment/Plan: 63 year old male with a PMH of HTN, DM2, CAD (s/p MI and LAD stent), kidney transplantation, carotid artery stenosis (s/p CEA 08/2011), PVD (s/p fem-pop in 1980), dHF (EF 45-50% Jan 2015), hx of papillary thyroid cancer and prostate cancer who presents with complaint of edema x 3 days and dyspnea for several weeks.   #New liver masses on CT - After speaking with oncology about the patient's poor prognosis and that he would not be a candidate for aggressive treatment, family would like to pursue residential hospice placement. Focus on pain control and comfort. - Appreciate oncology recs - Appreciate very much palliative recs, they will continue to follow as we pursue a hospice bed - DNR/DNI  - D/c telemetry - Dilaudid 0.5mg  q2h prn for moderate pain, SOB, tachypnea - Ativan q4h prn anxiety - Discharge to residential hospice when bed available [facility] [place] [facility])  #Hyperkalemia - K of 5.1 yesterday.  - No further blood draws given hospice  #Constipation - He had not had a bowel movement in several days, and is not taking good po. Abdomen is distended, but quite soft and non-tender on exam. He is afebrile. Do not suspect SBP at this time.  - Senna-docusate PRN for constipation   #Tachyarrhythmias - Rate 112 today. Asymptomatic. He was unable to swallow mediations this morning, including metoprolol. - Stop telemetry - Routine vital signs - May give IV lopressor if patient appears uncomfortable with tachycardia  #ESRD ~13 years s/p renal transplant - Transplant has failed this admission. Was on HD MWF, but patient and family would like to stop this and pursue hospice placement.  #Volume overload - Stable. He still has abdominal distention, likely from liver pathology given CT results. Lung exam is unchanged.  - Appreciate nephrology recs - Daily weights > 178 > 179 > 171 (dry weight 180) -  I&Os > +230cc yesterday > -11.5L since admission   #B/L pleural effusions - Patient does not appear dyspneic. Thoracentesis was completed 02/22, analysis consistent with transudate, likely 2/2 CHF vs liver pathology. Cytology report did show atypical cells, staining pattern favors reactive mesothelial cells, but cannot exclude neoplasia.  - Oxygen supplementation prn  - Incentive spirometer  - Albuterol nebs q6h   #HCAP, resolved - Antibiotic treatment completed. No recent fevers and no leukocytosis. Final blood cultures - no growth.  - Oxygen supplementation prn  - Albuterol nebs prn q6h   #Acute on chronic anemia - He has anemia of chronic disease. He got RBCs during dialysis 3/2. Goal hemoglobin 10-11.  Patient has several oozing superficial skin wounds and thrombocytopenia, as below.  - No further blood draws given hospice  Hemoglobin   Date  Value  Ref Range  Status   05/12/2013  10.8*  13.0 - 17.0 g/dL  Final   05/11/2013  8.7*  13.0 - 17.0 g/dL  Final   05/10/2013  10.0*  13.0 - 17.0 g/dL  Final   05/09/2013  10.5*  13.0 - 17.0 g/dL  Final   DELTA CHECK NOTED   05/09/2013  13.9  13.0 - 17.0 g/dL  Final   REPEATED TO VERIFY   DELTA CHECK NOTED   POST TRANSFUSION SPECIMEN    #Thrombocytopenia - Platelets continue to slowly decline. We have stopped the Lovenox. Could also be 2/2 reduced liver synthetic function given CT findings. Sequela of superficial bleeding on exam. Platelets at 70 yesterday. - Place and maintain SCDs  - No further blood draws given hospice  #Hypocalcemia - Improved with supplementation.  Calcium   Date  Value  Ref Range  Status   05/12/2013  8.7  8.4 - 10.5 mg/dL  Final   05/12/2013  8.2*  8.4 - 10.5 mg/dL  Final   05/11/2013  8.1*  8.4 - 10.5 mg/dL  Final   05/10/2013  8.0*  8.4 - 10.5 mg/dL  Final   05/09/2013  8.7  8.4 - 10.5 mg/dL  Final    #DM type 2 - No further episodes of hypoglycemia. - Checking CBGs AC and QHS   #HTN - Stable. 120/74 this am. - Holding  anti-hypertensives  #H/o prostate cancer with T10 lesion noted on prior MRI - T10 biopsy done inpatient on 02/18 - revealed necrosis and the appearance of "Ghost cells" that are likely malignant.  - Pain control as above - Lidocaine patch daily  #Unstageable pressure ulcer, sacral - Patient aware of ulcer prior to admission. Wound care consulted and recommendations appreciated.  - Twice daily cleanse with normal saline and pat dry, debridement with Santyl, moist NS dressing (pack into wound), secure with dry 4x4 and tape  - Pressure redistribution pad   #Severe malnutrition in the context of chronic illness - Patient is not tolerating good po.  - Biotene rinse - Comfort feeding via wife  #DVT ppx - SCDs   Dispo: Disposition is deferred at this time, awaiting improvement of current medical problems and/or further discussion of GOC. Anticipated discharge in approximately 3-4 day(s).   The patient does have a current PCP (Sueanne Margarita, MD) and does not need an [facility] hospital follow-up appointment after discharge.  The patient does not have transportation limitations that hinder transportation to clinic appointments.  .Services Needed at time of discharge: Y = Yes, Blank = No PT:   OT:   RN:   Equipment:   Other:     LOS: 24 days   Joshua Dukes, MD 05/15/2013, 7:19 AM  Joshua Dukes, MD  [email or web address] Pager # [phone] Office # [phone]

## 2013-05-15 NOTE — Progress Notes (Signed)
Received call from [place] that patient has a bed at [facility] and family would like to transport him around 1pm. Discharge summary is complete, orders have been placed, and gold form is on the shadow chart.  Lesly Dukes, MD  [email or web address] Pager # [phone] Office # [phone]

## 2013-05-15 NOTE — Progress Notes (Signed)
Chaplain entered room and observed pt lying in his bed, his wife at bedside, pt's daughter and grandson sitting in the room.  Chaplain rendered pastoral care through a ministry of presence, caring conversation and empathic listening.    05/15/13 1100  Clinical Encounter Type  Visited With Patient and family together  Visit Type Spiritual support  Spiritual Encounters  Spiritual Needs Emotional    Estelle June, chaplain pager [phone]

## 2013-05-15 NOTE — Discharge Summary (Signed)
  Date: 05/15/2013  Patient name: Joshua [name]  Medical record number: [id number]  Date of birth: [birth year]   This patient has been seen and the plan of care was discussed with the house staff. Please see their note for complete details. I concur with their findings and plan.  Dominic Pea, DO, [place] [facility] 05/15/2013, 3:01 PM

## 2013-05-15 NOTE — Progress Notes (Signed)
Clinical Social Worker facilitated patient discharge by contacting the family and [facility]. Patient's wife agreeable to this plan and arranging transport via EMS. CSW will sign off, as social work intervention is no longer needed.  Jeanette Caprice, MSW, [place]

## 2013-05-15 NOTE — Progress Notes (Signed)
Progress Note from the Palliative Medicine Team at [facility]: Joshua [name] is very lethargic this morning and his wife is talking with Dr. Lucila Maine when I entered the room. He has declined over the weekend and is now unable to swallow and eat anything. I have had numerous discussions at length with his wife and children about his poor prognosis related to his renal failure and his liver masses likely representing advanced metastatic disease. Joshua [name] proceeded to tell me on Thursday that he wanted DNR and that he knew he did not have long to live. I spoke with Joshua [name] this morning and she mentioned feeding tube and we discussed that he has more serious health issues that we cannot reverse and that feeding tube is not recommended - she understands and agrees saying that he would not want a feeding tube. We agree that we should focus on his comfort per his wishes. I explain that although we cannot treat his other illnesses what we can do is make him comfortable and make sure he does not suffer. Joshua [name] is very tearful but understands his prognosis; she says she cannot imagine going home without him. She is being supported by her children and grandchildren and their son is on his way from [place] today. I did tell them that he will continue to sleep more and more and interact and eat less and less and that he could pass anytime and would likely be days life expectancy. I have also explained that he will be unable to take his pills and that we will be giving him medication for his comfort through his IV. I did write notes for work and school for [employer]. It has been a pleasure to meet Mr. Anello and his family and provide emotional support for them during this very difficult time.    Objective: Allergies  Allergen Reactions  . Fentanyl     "Almost died"   . Propofol     "Almost died"    Scheduled Meds: . amiodarone  200 mg Oral Daily  . antiseptic oral rinse  15  mL Mouth Rinse BID  . aspirin EC  81 mg Oral Daily  . atorvastatin  80 mg Oral q1800  . calcium acetate  2,001 mg Oral TID WC  . calcium carbonate  800 mg of elemental calcium Oral QHS  . collagenase   Topical Daily  . feeding supplement (GLUCERNA SHAKE)  237 mL Oral BID BM  . insulin aspart  0-9 Units Subcutaneous TID WC  . insulin glargine  10 Units Subcutaneous QHS  . levothyroxine  150 mcg Oral QAC breakfast  . lidocaine  1 patch Transdermal QAC breakfast  . metoprolol  150 mg Oral BID  . multivitamin with minerals  1 tablet Oral Daily  . mycophenolate  720 mg Oral BID  . pantoprazole  40 mg Oral Daily  . sirolimus  1 mg Oral q morning - 10a   Continuous Infusions: . sodium chloride Stopped (05/08/13 0831)   PRN Meds:.albuterol, bisacodyl, calamine, diphenhydrAMINE, HYDROmorphone (DILAUDID) injection, lip balm, LORazepam  BP 120/74  Pulse 112  Temp(Src) 97.6 F (36.4 C) (Oral)  Resp 18  Ht 5\' 11"  (1.803 m)  Wt 78 kg (171 lb 15.3 oz)  BMI 23.99 kg/m2  SpO2 97%   PPS: 20%  Pain Score: non-verbal    Intake/Output Summary (Last 24 hours) at 05/15/13 1150 Last data filed at 05/15/13 1100  Gross per 24 hour  Intake  60 ml  Output      1 ml  Net     59 ml      LBM: 05/13/13      Physical Exam:   General: Lethargic, cachectic HEENT:  + Scleral icterus, + temporal muscle wasting, dry mucous membranes Chest: Clear throughout, diminished bases, no labored breathing CVS: Tachycardic, regular, S1 S2 Abdomen: Severe distention, no signs tenderness to palpation, hypoactive BS Ext: No edema, warm to touch, weak DP pulses Neuro: Lethargic, resting comfortably   Labs: CBC    Component Value Date/Time   WBC 6.7 05/14/2013 0425   RBC 3.48* 05/14/2013 0425   RBC 3.03* 05/01/2013 1245   HGB 8.8* 05/14/2013 0425   HCT 28.5* 05/14/2013 0425   PLT 48* 05/14/2013 0425   MCV 81.9 05/14/2013 0425   MCH 25.3* 05/14/2013 0425   MCHC 30.9 05/14/2013 0425   RDW 23.6* 05/14/2013 0425   LYMPHSABS  1.1 05/03/2013 0315   MONOABS 0.9 05/03/2013 0315   EOSABS 0.1 05/03/2013 0315   BASOSABS 0.0 05/03/2013 0315    BMET    Component Value Date/Time   NA 141 05/14/2013 0425   K 5.1 05/14/2013 0425   CL 92* 05/14/2013 0425   CO2 23 05/14/2013 0425   GLUCOSE 210* 05/14/2013 0425   BUN 60* 05/14/2013 0425   CREATININE 3.68* 05/14/2013 0425   CALCIUM 8.9 05/14/2013 0425   GFRNONAA 16* 05/14/2013 0425   GFRAA 19* 05/14/2013 0425    CMP     Component Value Date/Time   NA 141 05/14/2013 0425   K 5.1 05/14/2013 0425   CL 92* 05/14/2013 0425   CO2 23 05/14/2013 0425   GLUCOSE 210* 05/14/2013 0425   BUN 60* 05/14/2013 0425   CREATININE 3.68* 05/14/2013 0425   CALCIUM 8.9 05/14/2013 0425   PROT 6.0 05/14/2013 0425   ALBUMIN 1.5* 05/14/2013 0425   AST 398* 05/14/2013 0425   ALT 103* 05/14/2013 0425   ALKPHOS 864* 05/14/2013 0425   BILITOT 0.5 05/14/2013 0425   GFRNONAA 16* 05/14/2013 0425   GFRAA 19* 05/14/2013 0425     Assessment and Plan: 1. Code Status: DNR 2. Symptom Control: 1. Pain: Hydromorphone prn. Lidoderm patch to back.  2. Anxiety/agitation: Lorazepam prn.  3. Bowel Regimen: Dulcolax supp prn.  4. Itching: Benedryl prn.  3. Psycho/Social: Emotional support provided to family. 4. Spiritual: Spiritual care following. 5. Disposition: Hospice facility.     Time In Time Out Total Time Spent with Patient Total Overall Time  0815 0900 5min 70min    Greater than 50%  of this time was spent counseling and coordinating care related to the above assessment and plan.  Vinie Sill, NP Palliative Medicine Team Pager # [phone] (M-F 8a-5p) Team Phone # [phone] (Nights/Weekends)   1

## 2013-05-15 NOTE — Progress Notes (Signed)
Report given to [facility], family with patient

## 2013-05-15 NOTE — Discharge Instructions (Signed)
It was a pleasure taking care of you. - This patient with likely metastatic cancer, ESRD, heart failure is now DNR/DNI and following a comfort care approach per his wishes

## 2013-05-16 NOTE — Progress Notes (Signed)
I have reviewed this case with our NP and agree with the Assessment and Plan as stated.  Sherion Dooly L. Ethelmae Ringel, MD MBA The Palliative Medicine Team at [facility] Team Phone: [phone] Pager: [phone]   

## 2013-05-31 ENCOUNTER — Telehealth: Payer: Self-pay

## 2013-05-31 NOTE — Telephone Encounter (Signed)
Patient past away  @ [facility] per [facility]

## 2013-06-06 ENCOUNTER — Ambulatory Visit: Payer: Medicare Other | Admitting: Internal Medicine

## 2013-06-07 DEATH — deceased

## 2013-06-12 ENCOUNTER — Other Ambulatory Visit (HOSPITAL_COMMUNITY): Payer: Medicare Other

## 2013-07-12 ENCOUNTER — Other Ambulatory Visit: Payer: Medicare Other

## 2013-11-01 ENCOUNTER — Ambulatory Visit: Payer: Medicare Other | Admitting: Family

## 2013-11-01 ENCOUNTER — Other Ambulatory Visit (HOSPITAL_COMMUNITY): Payer: Medicare Other

## 2014-02-15 ENCOUNTER — Encounter (HOSPITAL_COMMUNITY): Payer: Self-pay | Admitting: Internal Medicine

## 2015-12-08 IMAGING — US US RENAL
1 series · 14 of 25 positions shown · non-contrast
Comparison: CT, 02/28/2013

CLINICAL DATA: Acute kidney injury. Patient with bilateral renal
transplant.

EXAM:
RENAL/URINARY TRACT ULTRASOUND COMPLETE

[Series 1: us renal · 0.31mm/px · 14 of 31 slices shown]
[im 1/31]
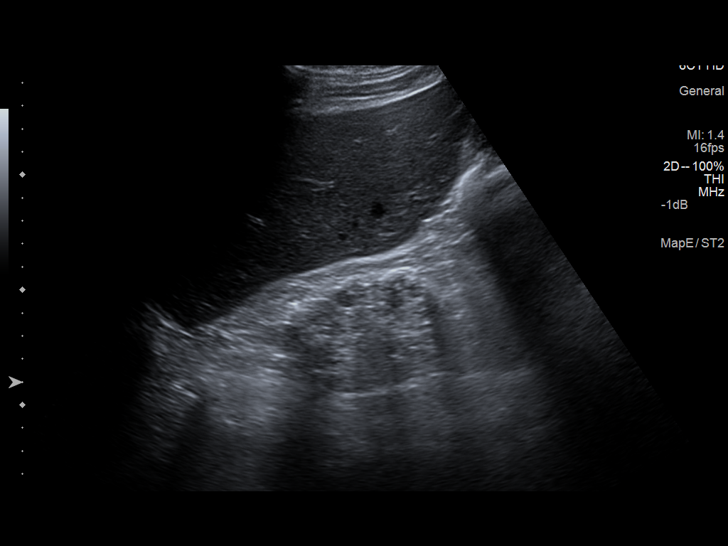
[im 3/31]
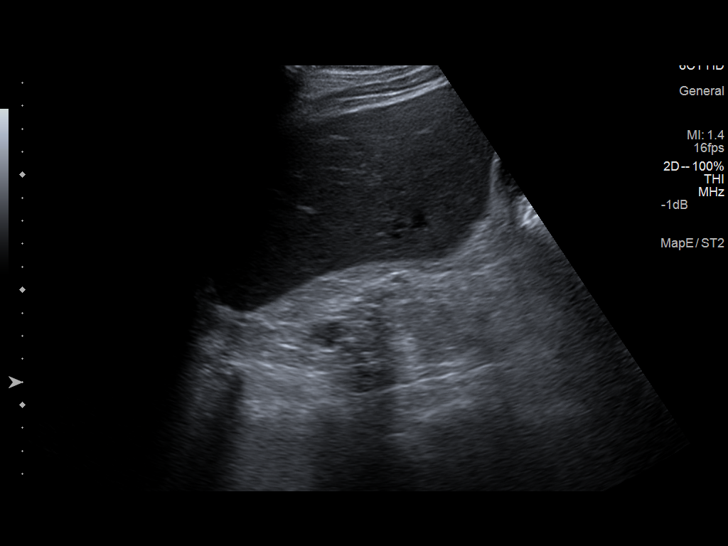
[im 6/31]
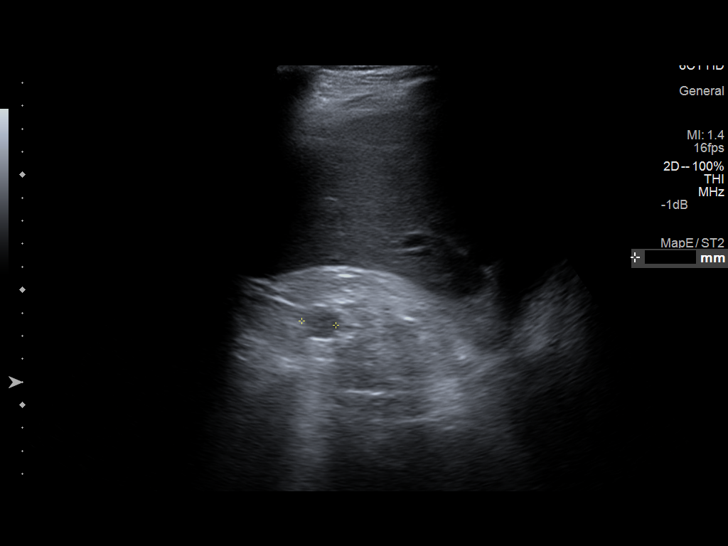
[im 8/31]
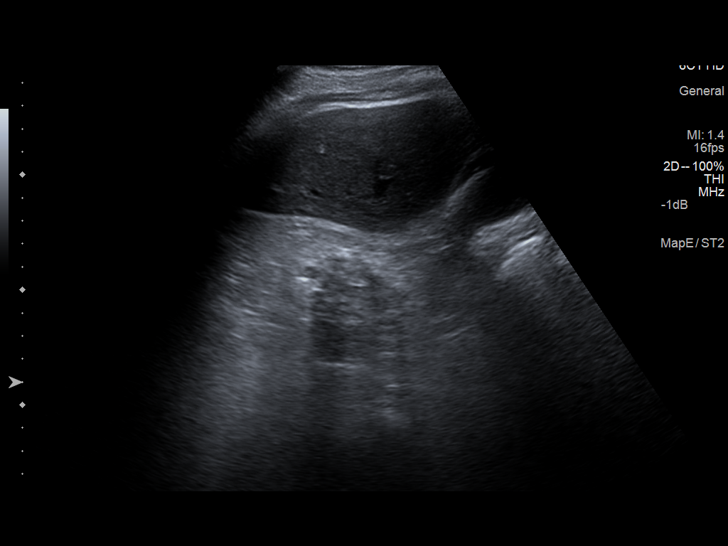
[im 11/31]
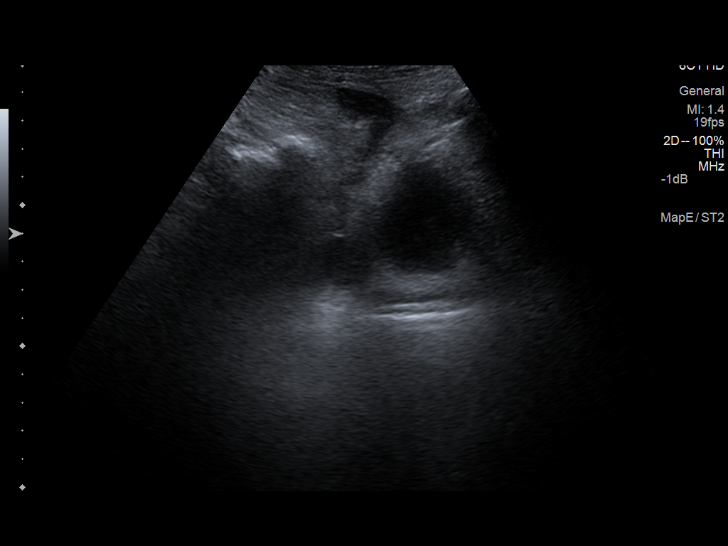
[im 12/31]
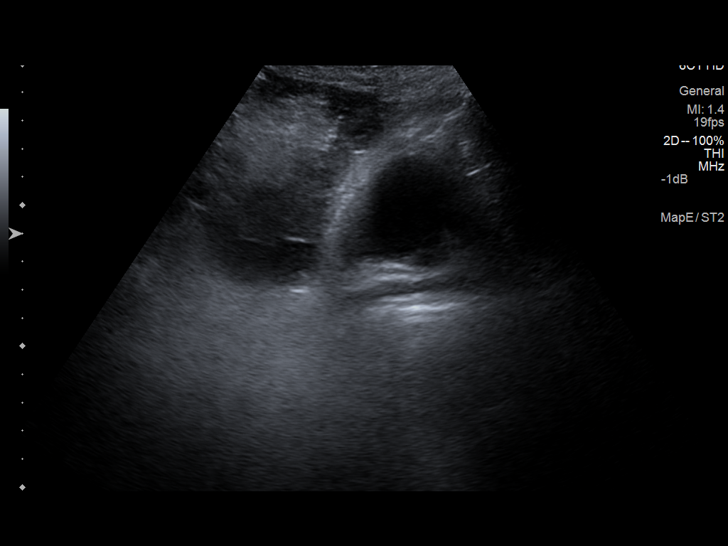
[im 14/31]
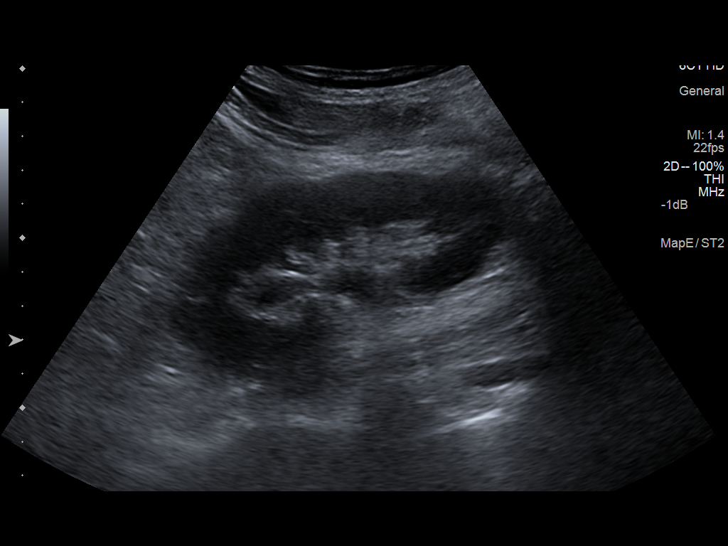
[im 17/31]
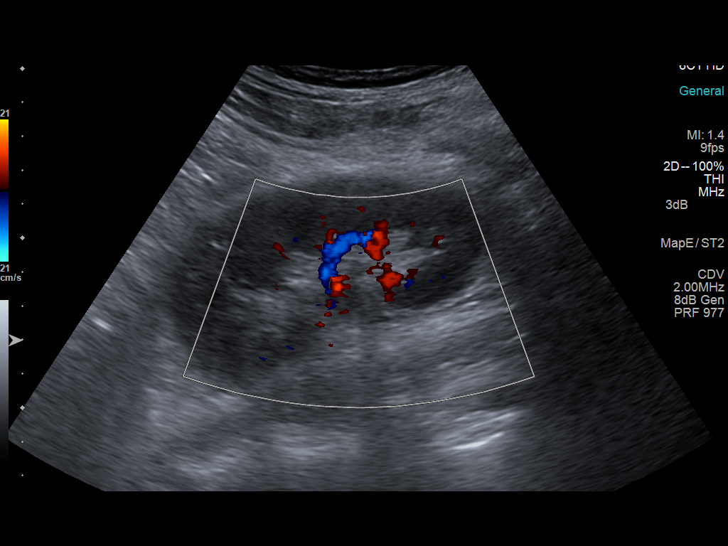
[im 19/31]
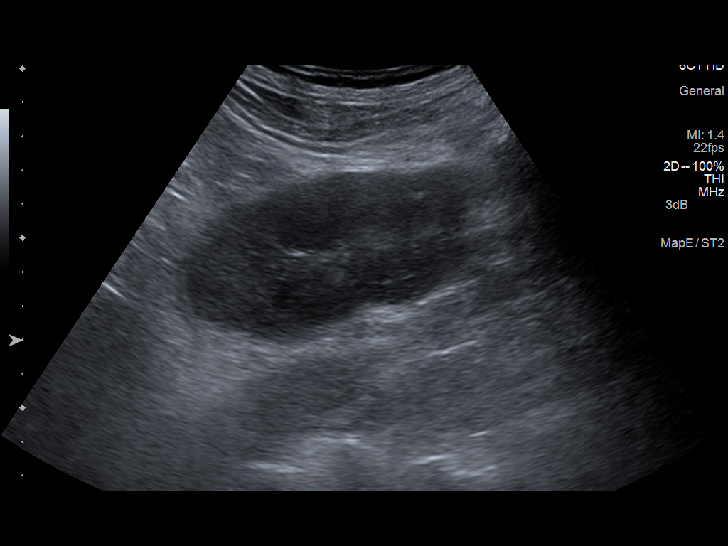
[im 21/31]
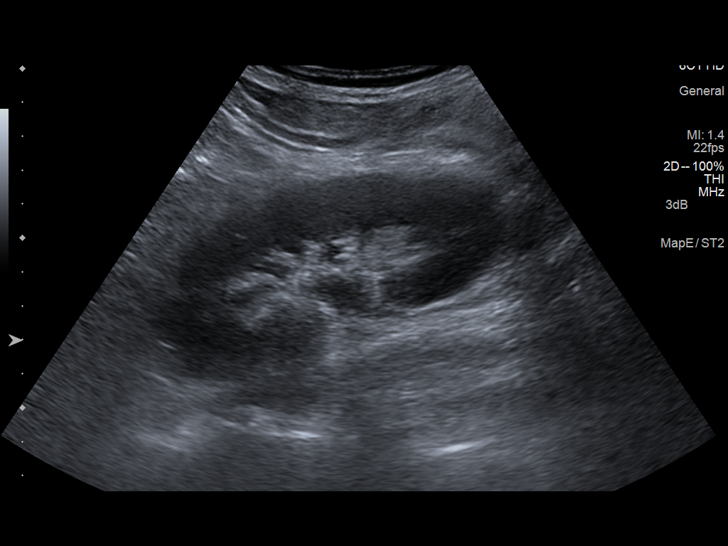
[im 23/31]
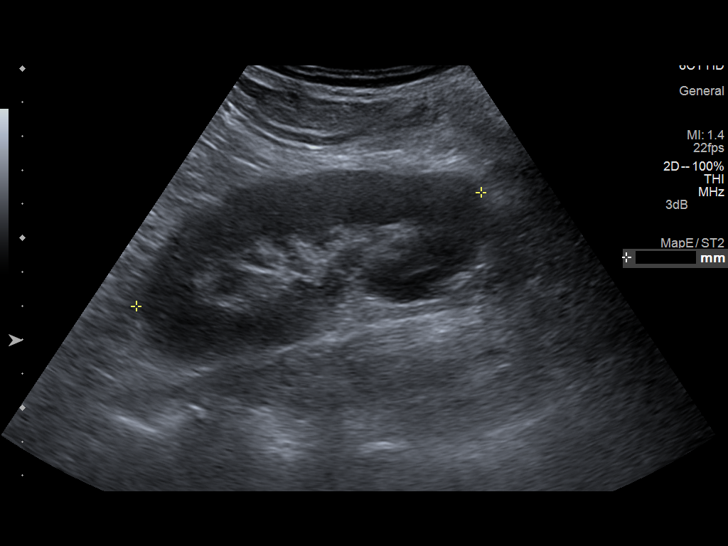
[im 26/31]
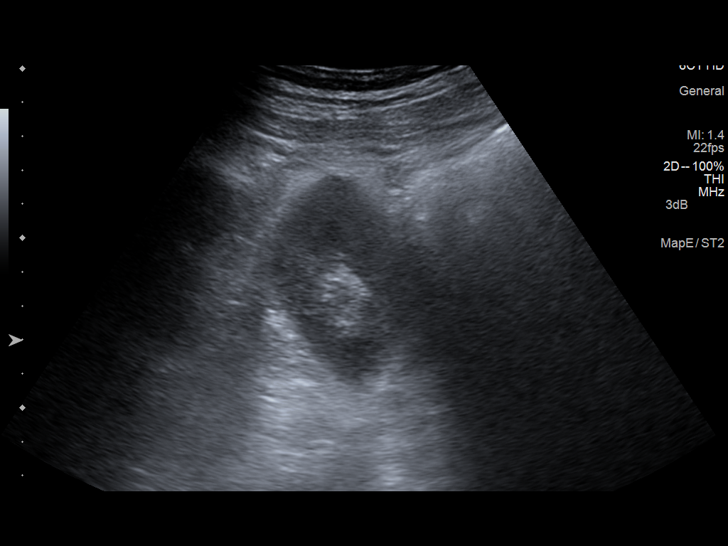
[im 28/31]
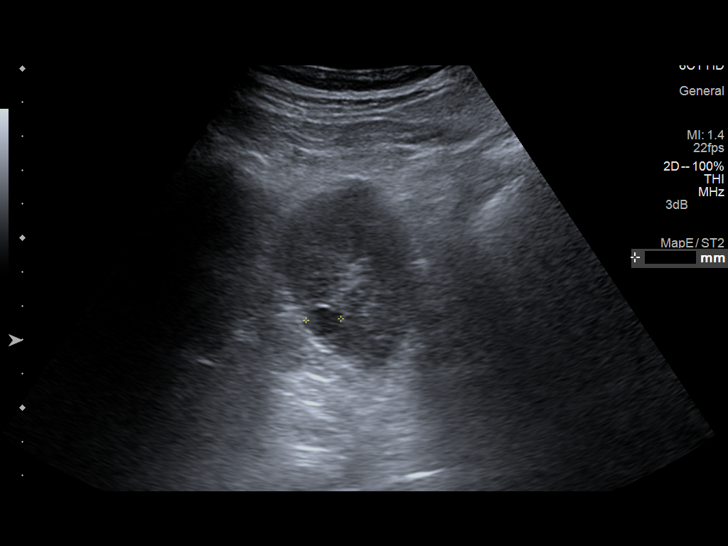
[im 31/31]
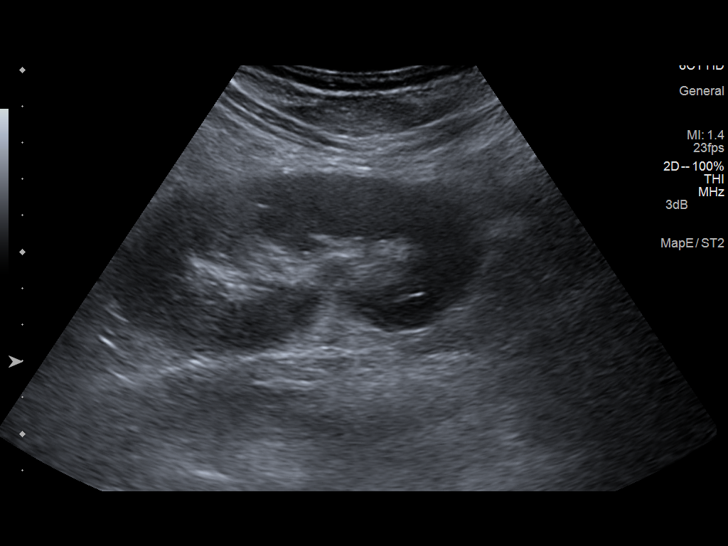

[14 of 25 positions shown; findings below may reference images not displayed]

FINDINGS: Right Kidney:

Length: 7.4 cm. Kidney is echogenic. 15 mm cyst arises from the
upper pole. There are probable additional tiny cysts. No
hydronephrosis.

Left Kidney:

Native left kidney not visualized.

Transplant kidney: In the right lower quadrant to right upper
pelvis, a transplant kidney is visualized. Is normal in overall size
measuring 10.7 cm. It has normal parenchymal echogenicity. Small
cyst arises from its lower pole measuring 1 cm. No other renal
masses. No hydronephrosis.

Bladder:

Appears normal for degree of bladder distention.
IMPRESSION: 1. Transplant kidney is normal in appearance within a small lower
pole cyst measuring 1 cm.
2. Atrophic native kidneys. Only the right was visualized. Is
echogenic with at least 1 discrete small cysts. No hydronephrosis.
